# Patient Record
Sex: Female | Born: 1960 | Race: White | Hispanic: No | Marital: Married | Smoking: Never smoker
Health system: Southern US, Community
[De-identification: ages and names within clinical notes are randomized; demographics above are authoritative.]

## PROBLEM LIST (undated history)

## (undated) DIAGNOSIS — J449 Chronic obstructive pulmonary disease, unspecified: Secondary | ICD-10-CM

## (undated) DIAGNOSIS — D649 Anemia, unspecified: Secondary | ICD-10-CM

## (undated) DIAGNOSIS — J302 Other seasonal allergic rhinitis: Secondary | ICD-10-CM

## (undated) DIAGNOSIS — I10 Essential (primary) hypertension: Secondary | ICD-10-CM

## (undated) DIAGNOSIS — E785 Hyperlipidemia, unspecified: Secondary | ICD-10-CM

## (undated) DIAGNOSIS — J189 Pneumonia, unspecified organism: Secondary | ICD-10-CM

## (undated) DIAGNOSIS — I82409 Acute embolism and thrombosis of unspecified deep veins of unspecified lower extremity: Secondary | ICD-10-CM

## (undated) DIAGNOSIS — T884XXA Failed or difficult intubation, initial encounter: Secondary | ICD-10-CM

## (undated) DIAGNOSIS — J45909 Unspecified asthma, uncomplicated: Secondary | ICD-10-CM

## (undated) DIAGNOSIS — I509 Heart failure, unspecified: Secondary | ICD-10-CM

## (undated) DIAGNOSIS — E119 Type 2 diabetes mellitus without complications: Secondary | ICD-10-CM

## (undated) DIAGNOSIS — B019 Varicella without complication: Secondary | ICD-10-CM

## (undated) DIAGNOSIS — K219 Gastro-esophageal reflux disease without esophagitis: Secondary | ICD-10-CM

## (undated) DIAGNOSIS — K449 Diaphragmatic hernia without obstruction or gangrene: Secondary | ICD-10-CM

## (undated) HISTORY — PX: NASAL SINUS SURGERY: SHX719

## (undated) HISTORY — PX: CHOLECYSTECTOMY: SHX55

## (undated) HISTORY — PX: HERNIA REPAIR: SHX51

## (undated) HISTORY — PX: ESOPHAGEAL DILATION: SHX303

## (undated) HISTORY — PX: OTHER SURGICAL HISTORY: SHX169

---

## 1996-03-11 DIAGNOSIS — T8859XA Other complications of anesthesia, initial encounter: Secondary | ICD-10-CM

## 1996-03-11 HISTORY — DX: Other complications of anesthesia, initial encounter: T88.59XA

## 2003-01-10 ENCOUNTER — Other Ambulatory Visit: Payer: Self-pay

## 2003-08-13 ENCOUNTER — Other Ambulatory Visit: Payer: Self-pay

## 2003-12-15 ENCOUNTER — Ambulatory Visit: Payer: Self-pay | Admitting: Internal Medicine

## 2004-04-18 ENCOUNTER — Ambulatory Visit: Payer: Self-pay

## 2004-04-19 ENCOUNTER — Inpatient Hospital Stay: Payer: Self-pay | Admitting: Surgery

## 2004-04-28 ENCOUNTER — Emergency Department: Payer: Self-pay | Admitting: Surgery

## 2004-05-30 ENCOUNTER — Inpatient Hospital Stay: Payer: Self-pay | Admitting: Surgery

## 2004-12-25 ENCOUNTER — Ambulatory Visit: Payer: Self-pay | Admitting: Internal Medicine

## 2005-03-11 DIAGNOSIS — K449 Diaphragmatic hernia without obstruction or gangrene: Secondary | ICD-10-CM

## 2005-03-11 HISTORY — DX: Diaphragmatic hernia without obstruction or gangrene: K44.9

## 2005-06-13 ENCOUNTER — Ambulatory Visit: Payer: Self-pay | Admitting: Gastroenterology

## 2005-09-22 ENCOUNTER — Emergency Department: Payer: Self-pay | Admitting: Emergency Medicine

## 2006-04-03 ENCOUNTER — Other Ambulatory Visit: Payer: Self-pay

## 2006-04-03 ENCOUNTER — Emergency Department: Payer: Self-pay | Admitting: Emergency Medicine

## 2006-06-10 ENCOUNTER — Ambulatory Visit: Payer: Self-pay | Admitting: Internal Medicine

## 2006-10-27 ENCOUNTER — Ambulatory Visit: Payer: Self-pay | Admitting: Family Medicine

## 2007-11-09 ENCOUNTER — Ambulatory Visit: Payer: Self-pay | Admitting: Internal Medicine

## 2007-11-10 ENCOUNTER — Ambulatory Visit: Payer: Self-pay | Admitting: Internal Medicine

## 2008-01-11 ENCOUNTER — Ambulatory Visit: Payer: Self-pay | Admitting: Internal Medicine

## 2008-02-22 ENCOUNTER — Inpatient Hospital Stay: Payer: Self-pay | Admitting: Internal Medicine

## 2008-06-18 ENCOUNTER — Emergency Department: Payer: Self-pay | Admitting: Unknown Physician Specialty

## 2008-06-29 ENCOUNTER — Ambulatory Visit: Payer: Self-pay | Admitting: Internal Medicine

## 2008-08-04 ENCOUNTER — Emergency Department: Payer: Self-pay | Admitting: Emergency Medicine

## 2008-08-17 ENCOUNTER — Ambulatory Visit: Payer: Self-pay | Admitting: Internal Medicine

## 2008-10-10 ENCOUNTER — Ambulatory Visit: Payer: Self-pay | Admitting: Gastroenterology

## 2008-10-13 ENCOUNTER — Inpatient Hospital Stay: Payer: Self-pay | Admitting: *Deleted

## 2008-10-29 ENCOUNTER — Emergency Department: Payer: Self-pay | Admitting: Unknown Physician Specialty

## 2009-10-04 ENCOUNTER — Ambulatory Visit: Payer: Self-pay | Admitting: Internal Medicine

## 2009-10-11 ENCOUNTER — Ambulatory Visit: Payer: Self-pay | Admitting: Ophthalmology

## 2009-10-16 ENCOUNTER — Emergency Department: Payer: Self-pay | Admitting: Emergency Medicine

## 2009-11-13 ENCOUNTER — Emergency Department: Payer: Self-pay | Admitting: Emergency Medicine

## 2009-12-06 ENCOUNTER — Ambulatory Visit: Payer: Self-pay | Admitting: Ophthalmology

## 2010-03-28 ENCOUNTER — Inpatient Hospital Stay: Payer: Self-pay | Admitting: Internal Medicine

## 2010-06-07 ENCOUNTER — Inpatient Hospital Stay: Payer: Self-pay | Admitting: *Deleted

## 2010-12-16 ENCOUNTER — Observation Stay: Payer: Self-pay | Admitting: Internal Medicine

## 2011-02-09 ENCOUNTER — Inpatient Hospital Stay: Payer: Self-pay | Admitting: Internal Medicine

## 2011-04-08 ENCOUNTER — Inpatient Hospital Stay: Payer: Self-pay | Admitting: Internal Medicine

## 2011-04-08 LAB — COMPREHENSIVE METABOLIC PANEL
Albumin: 3.5 g/dL (ref 3.4–5.0)
Anion Gap: 9 (ref 7–16)
Bilirubin,Total: 0.5 mg/dL (ref 0.2–1.0)
Co2: 27 mmol/L (ref 21–32)
Creatinine: 0.55 mg/dL — ABNORMAL LOW (ref 0.60–1.30)
EGFR (African American): >60
EGFR (Non-African Amer.): >60
Glucose: 127 mg/dL — ABNORMAL HIGH (ref 65–99)
Potassium: 3.8 mmol/L (ref 3.5–5.1)
SGOT(AST): 26 U/L (ref 15–37)
Sodium: 143 mmol/L (ref 136–145)

## 2011-04-08 LAB — CBC
HCT: 38.5 % (ref 35.0–47.0)
HGB: 13.3 g/dL (ref 12.0–16.0)
MCH: 32.1 pg (ref 26.0–34.0)
MCV: 93 fL (ref 80–100)
RBC: 4.14 10*6/uL (ref 3.80–5.20)
RDW: 13 % (ref 11.5–14.5)
WBC: 8 10*3/uL (ref 3.6–11.0)

## 2011-04-08 LAB — TROPONIN I: Troponin-I: <0.02 ng/mL

## 2011-04-08 LAB — PRO B NATRIURETIC PEPTIDE: B-Type Natriuretic Peptide: 34 pg/mL (ref 0–125)

## 2011-04-09 LAB — CBC WITH DIFFERENTIAL/PLATELET
Basophil #: 0 10*3/uL (ref 0.0–0.1)
Basophil %: 0.2 %
Eosinophil %: 0 %
HCT: 36.5 % (ref 35.0–47.0)
HGB: 12.6 g/dL (ref 12.0–16.0)
Lymphocyte #: 0.7 10*3/uL — ABNORMAL LOW (ref 1.0–3.6)
Lymphocyte %: 7.3 %
MCV: 93 fL (ref 80–100)
Monocyte %: 1 %
Neutrophil #: 8.3 10*3/uL — ABNORMAL HIGH (ref 1.4–6.5)
Platelet: 215 10*3/uL (ref 150–440)
RBC: 3.94 10*6/uL (ref 3.80–5.20)
WBC: 9 10*3/uL (ref 3.6–11.0)

## 2011-04-09 LAB — BASIC METABOLIC PANEL
Anion Gap: 11 (ref 7–16)
BUN: 11 mg/dL (ref 7–18)
Chloride: 106 mmol/L (ref 98–107)
Co2: 26 mmol/L (ref 21–32)
Creatinine: 0.58 mg/dL — ABNORMAL LOW (ref 0.60–1.30)
Sodium: 143 mmol/L (ref 136–145)

## 2011-04-09 LAB — TROPONIN I: Troponin-I: <0.02 ng/mL

## 2011-04-26 ENCOUNTER — Ambulatory Visit: Payer: Self-pay | Admitting: Gastroenterology

## 2011-06-26 ENCOUNTER — Ambulatory Visit: Payer: Self-pay | Admitting: Internal Medicine

## 2011-07-02 ENCOUNTER — Ambulatory Visit: Payer: Self-pay | Admitting: Gastroenterology

## 2011-07-04 LAB — PATHOLOGY REPORT

## 2011-07-30 ENCOUNTER — Observation Stay: Payer: Self-pay | Admitting: Internal Medicine

## 2011-07-30 LAB — BASIC METABOLIC PANEL
Anion Gap: 9 (ref 7–16)
BUN: 6 mg/dL — ABNORMAL LOW (ref 7–18)
Calcium, Total: 8.7 mg/dL (ref 8.5–10.1)
Chloride: 106 mmol/L (ref 98–107)
Creatinine: 0.61 mg/dL (ref 0.60–1.30)
EGFR (African American): >60
EGFR (Non-African Amer.): >60
Potassium: 3.5 mmol/L (ref 3.5–5.1)
Sodium: 142 mmol/L (ref 136–145)

## 2011-07-30 LAB — CBC
HCT: 39.5 % (ref 35.0–47.0)
HGB: 13.1 g/dL (ref 12.0–16.0)
MCH: 31.2 pg (ref 26.0–34.0)
MCHC: 33.2 g/dL (ref 32.0–36.0)
MCV: 94 fL (ref 80–100)
RBC: 4.21 10*6/uL (ref 3.80–5.20)
RDW: 13.9 % (ref 11.5–14.5)
WBC: 8.6 10*3/uL (ref 3.6–11.0)

## 2011-07-31 LAB — HEMOGLOBIN A1C: Hemoglobin A1C: 6.3 % (ref 4.2–6.3)

## 2011-08-05 LAB — CULTURE, BLOOD (SINGLE)

## 2011-09-09 ENCOUNTER — Emergency Department: Payer: Self-pay | Admitting: Emergency Medicine

## 2011-09-09 LAB — URINALYSIS, COMPLETE
Bilirubin,UR: NEGATIVE
Blood: NEGATIVE
Glucose,UR: NEGATIVE mg/dL (ref 0–75)
Hyaline Cast: 1
Nitrite: NEGATIVE
Protein: NEGATIVE
RBC,UR: 1 /HPF (ref 0–5)
WBC UR: 1 /HPF (ref 0–5)

## 2011-09-09 LAB — CBC
HCT: 43.3 % (ref 35.0–47.0)
MCV: 93 fL (ref 80–100)
RBC: 4.65 10*6/uL (ref 3.80–5.20)
WBC: 13.9 10*3/uL — ABNORMAL HIGH (ref 3.6–11.0)

## 2011-09-09 LAB — HEPATIC FUNCTION PANEL A (ARMC)
Albumin: 3.7 g/dL (ref 3.4–5.0)
Alkaline Phosphatase: 122 U/L (ref 50–136)
Bilirubin,Total: 0.3 mg/dL (ref 0.2–1.0)
SGOT(AST): 17 U/L (ref 15–37)
SGPT (ALT): 28 U/L
Total Protein: 7 g/dL (ref 6.4–8.2)

## 2011-09-09 LAB — BASIC METABOLIC PANEL
Anion Gap: 8 (ref 7–16)
BUN: 16 mg/dL (ref 7–18)
Chloride: 103 mmol/L (ref 98–107)
Co2: 29 mmol/L (ref 21–32)
Creatinine: 0.74 mg/dL (ref 0.60–1.30)
EGFR (African American): >60
Osmolality: 283 (ref 275–301)
Sodium: 140 mmol/L (ref 136–145)

## 2011-09-09 LAB — CK TOTAL AND CKMB (NOT AT ARMC)
CK, Total: 41 U/L (ref 21–215)
CK-MB: 0.6 ng/mL (ref 0.5–3.6)

## 2011-09-09 LAB — PRO B NATRIURETIC PEPTIDE: B-Type Natriuretic Peptide: 37 pg/mL (ref 0–125)

## 2011-09-09 LAB — TROPONIN I: Troponin-I: <0.02 ng/mL

## 2011-09-10 ENCOUNTER — Ambulatory Visit: Payer: Self-pay | Admitting: Internal Medicine

## 2012-03-11 HISTORY — PX: EYE SURGERY: SHX253

## 2012-04-16 ENCOUNTER — Inpatient Hospital Stay: Payer: Self-pay | Admitting: Internal Medicine

## 2012-04-16 LAB — URINALYSIS, COMPLETE
Bacteria: NONE SEEN
Bilirubin,UR: NEGATIVE
Blood: NEGATIVE
Glucose,UR: >=500 mg/dL
Ketone: NEGATIVE
Leukocyte Esterase: NEGATIVE
Nitrite: NEGATIVE
Ph: 6
Protein: NEGATIVE
RBC,UR: <1 /HPF
Specific Gravity: 1.009
Squamous Epithelial: 1
WBC UR: <1 /HPF

## 2012-04-16 LAB — CBC
HCT: 40.8 % (ref 35.0–47.0)
MCH: 30.8 pg (ref 26.0–34.0)
Platelet: 265 10*3/uL (ref 150–440)
WBC: 18.2 10*3/uL — ABNORMAL HIGH (ref 3.6–11.0)

## 2012-04-16 LAB — CK TOTAL AND CKMB (NOT AT ARMC): CK-MB: 0.5 ng/mL (ref 0.5–3.6)

## 2012-04-16 LAB — COMPREHENSIVE METABOLIC PANEL WITH GFR
Albumin: 3.5 g/dL
Alkaline Phosphatase: 111 U/L
Anion Gap: 7
BUN: 13 mg/dL
Bilirubin,Total: 0.3 mg/dL
Calcium, Total: 8.4 mg/dL — ABNORMAL LOW
Chloride: 108 mmol/L — ABNORMAL HIGH
Co2: 27 mmol/L
Creatinine: 0.75 mg/dL
EGFR (African American): >60
EGFR (Non-African Amer.): >60
Glucose: 145 mg/dL — ABNORMAL HIGH
Osmolality: 286
Potassium: 3.2 mmol/L — ABNORMAL LOW
SGOT(AST): 19 U/L
SGPT (ALT): 28 U/L
Sodium: 142 mmol/L
Total Protein: 6.9 g/dL

## 2012-04-16 LAB — TROPONIN I: Troponin-I: <0.02 ng/mL

## 2012-04-17 LAB — BASIC METABOLIC PANEL
BUN: 15 mg/dL (ref 7–18)
Co2: 29 mmol/L (ref 21–32)
Creatinine: 0.61 mg/dL (ref 0.60–1.30)
Osmolality: 286 (ref 275–301)
Sodium: 140 mmol/L (ref 136–145)

## 2012-04-17 LAB — CBC WITH DIFFERENTIAL/PLATELET
Basophil #: 0 10*3/uL (ref 0.0–0.1)
Eosinophil %: 0 %
HGB: 13.1 g/dL (ref 12.0–16.0)
Lymphocyte #: 0.4 10*3/uL — ABNORMAL LOW (ref 1.0–3.6)
MCH: 31.2 pg (ref 26.0–34.0)
MCHC: 33.8 g/dL (ref 32.0–36.0)
MCV: 92 fL (ref 80–100)
Monocyte #: 0.5 x10 3/mm (ref 0.2–0.9)
Monocyte %: 3.7 %
Platelet: 241 10*3/uL (ref 150–440)
WBC: 13.6 10*3/uL — ABNORMAL HIGH (ref 3.6–11.0)

## 2012-04-17 LAB — HEMOGLOBIN A1C: Hemoglobin A1C: 7.2 % — ABNORMAL HIGH (ref 4.2–6.3)

## 2012-04-22 LAB — CULTURE, BLOOD (SINGLE)

## 2012-05-05 ENCOUNTER — Ambulatory Visit: Payer: Self-pay | Admitting: Internal Medicine

## 2012-07-09 ENCOUNTER — Emergency Department: Payer: Self-pay | Admitting: Emergency Medicine

## 2012-07-09 LAB — URINALYSIS, COMPLETE
Ketone: NEGATIVE
Nitrite: NEGATIVE
Ph: 5 (ref 4.5–8.0)
Protein: NEGATIVE
RBC,UR: <1 /HPF (ref 0–5)
Specific Gravity: 1.012 (ref 1.003–1.030)
Squamous Epithelial: 1
WBC UR: 2 /HPF (ref 0–5)

## 2012-07-09 LAB — CBC
HGB: 15.7 g/dL (ref 12.0–16.0)
MCH: 31.4 pg (ref 26.0–34.0)
Platelet: 386 10*3/uL (ref 150–440)
RBC: 5.02 10*6/uL (ref 3.80–5.20)
WBC: 11.6 10*3/uL — ABNORMAL HIGH (ref 3.6–11.0)

## 2012-07-09 LAB — CK TOTAL AND CKMB (NOT AT ARMC): CK-MB: 0.5 ng/mL (ref 0.5–3.6)

## 2012-07-09 LAB — COMPREHENSIVE METABOLIC PANEL
Albumin: 4.1 g/dL (ref 3.4–5.0)
Alkaline Phosphatase: 145 U/L — ABNORMAL HIGH (ref 50–136)
Anion Gap: 10 (ref 7–16)
BUN: 18 mg/dL (ref 7–18)
Bilirubin,Total: 0.3 mg/dL (ref 0.2–1.0)
Chloride: 98 mmol/L (ref 98–107)
Co2: 28 mmol/L (ref 21–32)
EGFR (African American): >60
Osmolality: 278 (ref 275–301)
SGPT (ALT): 27 U/L (ref 12–78)

## 2012-07-09 LAB — TROPONIN I: Troponin-I: <0.02 ng/mL

## 2012-12-04 ENCOUNTER — Ambulatory Visit: Payer: Self-pay

## 2013-02-06 ENCOUNTER — Inpatient Hospital Stay: Payer: Self-pay | Admitting: Internal Medicine

## 2013-02-06 LAB — COMPREHENSIVE METABOLIC PANEL
Albumin: 3.6 g/dL (ref 3.4–5.0)
Alkaline Phosphatase: 109 U/L
BUN: 9 mg/dL (ref 7–18)
Calcium, Total: 8.4 mg/dL — ABNORMAL LOW (ref 8.5–10.1)
Chloride: 109 mmol/L — ABNORMAL HIGH (ref 98–107)
Co2: 26 mmol/L (ref 21–32)
Creatinine: 0.73 mg/dL (ref 0.60–1.30)
EGFR (African American): >60
EGFR (Non-African Amer.): >60
Osmolality: 287 (ref 275–301)
Potassium: 3.5 mmol/L (ref 3.5–5.1)
SGOT(AST): 38 U/L — ABNORMAL HIGH (ref 15–37)
SGPT (ALT): 37 U/L (ref 12–78)

## 2013-02-06 LAB — TROPONIN I: Troponin-I: <0.02 ng/mL

## 2013-02-06 LAB — CBC
HGB: 13.5 g/dL (ref 12.0–16.0)
MCH: 32.2 pg (ref 26.0–34.0)
MCV: 92 fL (ref 80–100)
Platelet: 211 10*3/uL (ref 150–440)
RBC: 4.19 10*6/uL (ref 3.80–5.20)

## 2013-02-11 LAB — CULTURE, BLOOD (SINGLE)

## 2013-07-15 ENCOUNTER — Emergency Department: Payer: Self-pay | Admitting: Emergency Medicine

## 2013-07-23 ENCOUNTER — Emergency Department: Payer: Self-pay | Admitting: Internal Medicine

## 2013-08-16 ENCOUNTER — Ambulatory Visit: Payer: Self-pay | Admitting: Orthopedic Surgery

## 2013-09-28 DIAGNOSIS — M5416 Radiculopathy, lumbar region: Secondary | ICD-10-CM | POA: Insufficient documentation

## 2013-09-28 DIAGNOSIS — M5136 Other intervertebral disc degeneration, lumbar region: Secondary | ICD-10-CM | POA: Insufficient documentation

## 2014-04-28 DIAGNOSIS — I10 Essential (primary) hypertension: Secondary | ICD-10-CM | POA: Diagnosis not present

## 2014-04-28 DIAGNOSIS — J4550 Severe persistent asthma, uncomplicated: Secondary | ICD-10-CM | POA: Diagnosis not present

## 2014-04-28 DIAGNOSIS — J309 Allergic rhinitis, unspecified: Secondary | ICD-10-CM | POA: Diagnosis not present

## 2014-04-28 DIAGNOSIS — E119 Type 2 diabetes mellitus without complications: Secondary | ICD-10-CM | POA: Diagnosis not present

## 2014-04-28 DIAGNOSIS — E782 Mixed hyperlipidemia: Secondary | ICD-10-CM | POA: Diagnosis not present

## 2014-04-28 DIAGNOSIS — J441 Chronic obstructive pulmonary disease with (acute) exacerbation: Secondary | ICD-10-CM | POA: Diagnosis not present

## 2014-05-11 ENCOUNTER — Inpatient Hospital Stay: Payer: Self-pay | Admitting: Internal Medicine

## 2014-05-11 DIAGNOSIS — J9600 Acute respiratory failure, unspecified whether with hypoxia or hypercapnia: Secondary | ICD-10-CM | POA: Diagnosis not present

## 2014-05-11 DIAGNOSIS — Z79899 Other long term (current) drug therapy: Secondary | ICD-10-CM | POA: Diagnosis not present

## 2014-05-11 DIAGNOSIS — F419 Anxiety disorder, unspecified: Secondary | ICD-10-CM | POA: Diagnosis not present

## 2014-05-11 DIAGNOSIS — J441 Chronic obstructive pulmonary disease with (acute) exacerbation: Secondary | ICD-10-CM | POA: Diagnosis not present

## 2014-05-11 DIAGNOSIS — E119 Type 2 diabetes mellitus without complications: Secondary | ICD-10-CM | POA: Diagnosis not present

## 2014-05-11 DIAGNOSIS — R0602 Shortness of breath: Secondary | ICD-10-CM | POA: Diagnosis not present

## 2014-05-11 DIAGNOSIS — J4551 Severe persistent asthma with (acute) exacerbation: Secondary | ICD-10-CM | POA: Diagnosis not present

## 2014-05-11 DIAGNOSIS — E785 Hyperlipidemia, unspecified: Secondary | ICD-10-CM | POA: Diagnosis not present

## 2014-05-11 DIAGNOSIS — K210 Gastro-esophageal reflux disease with esophagitis: Secondary | ICD-10-CM | POA: Diagnosis not present

## 2014-05-11 DIAGNOSIS — J45901 Unspecified asthma with (acute) exacerbation: Secondary | ICD-10-CM | POA: Diagnosis not present

## 2014-05-11 DIAGNOSIS — E1165 Type 2 diabetes mellitus with hyperglycemia: Secondary | ICD-10-CM | POA: Diagnosis not present

## 2014-05-11 DIAGNOSIS — I509 Heart failure, unspecified: Secondary | ICD-10-CM | POA: Diagnosis not present

## 2014-05-11 DIAGNOSIS — I10 Essential (primary) hypertension: Secondary | ICD-10-CM | POA: Diagnosis not present

## 2014-05-11 DIAGNOSIS — J9601 Acute respiratory failure with hypoxia: Secondary | ICD-10-CM | POA: Diagnosis not present

## 2014-05-14 DIAGNOSIS — J441 Chronic obstructive pulmonary disease with (acute) exacerbation: Secondary | ICD-10-CM | POA: Diagnosis not present

## 2014-05-21 DIAGNOSIS — J441 Chronic obstructive pulmonary disease with (acute) exacerbation: Secondary | ICD-10-CM | POA: Diagnosis not present

## 2014-05-28 DIAGNOSIS — J441 Chronic obstructive pulmonary disease with (acute) exacerbation: Secondary | ICD-10-CM | POA: Diagnosis not present

## 2014-05-30 ENCOUNTER — Emergency Department: Payer: Self-pay | Admitting: Emergency Medicine

## 2014-05-30 DIAGNOSIS — J45901 Unspecified asthma with (acute) exacerbation: Secondary | ICD-10-CM | POA: Diagnosis not present

## 2014-05-30 DIAGNOSIS — R0602 Shortness of breath: Secondary | ICD-10-CM | POA: Diagnosis not present

## 2014-05-30 DIAGNOSIS — Z79899 Other long term (current) drug therapy: Secondary | ICD-10-CM | POA: Diagnosis not present

## 2014-05-30 DIAGNOSIS — J9801 Acute bronchospasm: Secondary | ICD-10-CM | POA: Diagnosis not present

## 2014-05-30 DIAGNOSIS — Z794 Long term (current) use of insulin: Secondary | ICD-10-CM | POA: Diagnosis not present

## 2014-05-30 DIAGNOSIS — Z7982 Long term (current) use of aspirin: Secondary | ICD-10-CM | POA: Diagnosis not present

## 2014-05-30 DIAGNOSIS — Z7952 Long term (current) use of systemic steroids: Secondary | ICD-10-CM | POA: Diagnosis not present

## 2014-05-30 DIAGNOSIS — Z7951 Long term (current) use of inhaled steroids: Secondary | ICD-10-CM | POA: Diagnosis not present

## 2014-05-30 DIAGNOSIS — Z880 Allergy status to penicillin: Secondary | ICD-10-CM | POA: Diagnosis not present

## 2014-06-04 DIAGNOSIS — J441 Chronic obstructive pulmonary disease with (acute) exacerbation: Secondary | ICD-10-CM | POA: Diagnosis not present

## 2014-06-07 DIAGNOSIS — J4550 Severe persistent asthma, uncomplicated: Secondary | ICD-10-CM | POA: Diagnosis not present

## 2014-06-07 DIAGNOSIS — E119 Type 2 diabetes mellitus without complications: Secondary | ICD-10-CM | POA: Diagnosis not present

## 2014-06-07 DIAGNOSIS — Z0001 Encounter for general adult medical examination with abnormal findings: Secondary | ICD-10-CM | POA: Diagnosis not present

## 2014-06-07 DIAGNOSIS — D485 Neoplasm of uncertain behavior of skin: Secondary | ICD-10-CM | POA: Diagnosis not present

## 2014-06-07 DIAGNOSIS — I10 Essential (primary) hypertension: Secondary | ICD-10-CM | POA: Diagnosis not present

## 2014-06-07 DIAGNOSIS — R300 Dysuria: Secondary | ICD-10-CM | POA: Diagnosis not present

## 2014-06-08 DIAGNOSIS — Z0001 Encounter for general adult medical examination with abnormal findings: Secondary | ICD-10-CM | POA: Diagnosis not present

## 2014-06-08 DIAGNOSIS — E119 Type 2 diabetes mellitus without complications: Secondary | ICD-10-CM | POA: Diagnosis not present

## 2014-06-08 DIAGNOSIS — Z6833 Body mass index (BMI) 33.0-33.9, adult: Secondary | ICD-10-CM | POA: Diagnosis not present

## 2014-06-21 DIAGNOSIS — E2839 Other primary ovarian failure: Secondary | ICD-10-CM | POA: Diagnosis not present

## 2014-06-21 DIAGNOSIS — M8588 Other specified disorders of bone density and structure, other site: Secondary | ICD-10-CM | POA: Diagnosis not present

## 2014-06-26 ENCOUNTER — Inpatient Hospital Stay
Admit: 2014-06-26 | Disposition: A | Discharge status: Home / Self Care | Payer: Self-pay | Attending: Internal Medicine | Admitting: Internal Medicine

## 2014-06-26 DIAGNOSIS — I10 Essential (primary) hypertension: Secondary | ICD-10-CM | POA: Diagnosis not present

## 2014-06-26 DIAGNOSIS — K219 Gastro-esophageal reflux disease without esophagitis: Secondary | ICD-10-CM | POA: Diagnosis not present

## 2014-06-26 DIAGNOSIS — E785 Hyperlipidemia, unspecified: Secondary | ICD-10-CM | POA: Diagnosis not present

## 2014-06-26 DIAGNOSIS — Z881 Allergy status to other antibiotic agents status: Secondary | ICD-10-CM | POA: Diagnosis not present

## 2014-06-26 DIAGNOSIS — R0602 Shortness of breath: Secondary | ICD-10-CM | POA: Diagnosis not present

## 2014-06-26 DIAGNOSIS — J9601 Acute respiratory failure with hypoxia: Secondary | ICD-10-CM | POA: Diagnosis not present

## 2014-06-26 DIAGNOSIS — J4521 Mild intermittent asthma with (acute) exacerbation: Secondary | ICD-10-CM | POA: Diagnosis not present

## 2014-06-26 DIAGNOSIS — Z794 Long term (current) use of insulin: Secondary | ICD-10-CM | POA: Diagnosis not present

## 2014-06-26 DIAGNOSIS — R05 Cough: Secondary | ICD-10-CM | POA: Diagnosis not present

## 2014-06-26 DIAGNOSIS — J45901 Unspecified asthma with (acute) exacerbation: Secondary | ICD-10-CM | POA: Diagnosis not present

## 2014-06-26 DIAGNOSIS — J449 Chronic obstructive pulmonary disease, unspecified: Secondary | ICD-10-CM | POA: Diagnosis not present

## 2014-06-26 DIAGNOSIS — E109 Type 1 diabetes mellitus without complications: Secondary | ICD-10-CM | POA: Diagnosis not present

## 2014-06-26 DIAGNOSIS — E119 Type 2 diabetes mellitus without complications: Secondary | ICD-10-CM | POA: Diagnosis not present

## 2014-06-26 LAB — CBC
HCT: 45.1 % (ref 35.0–47.0)
HGB: 15 g/dL (ref 12.0–16.0)
MCH: 30.7 pg (ref 26.0–34.0)
MCHC: 33.1 g/dL (ref 32.0–36.0)
MCV: 93 fL (ref 80–100)
PLATELETS: 263 10*3/uL (ref 150–440)
RBC: 4.88 10*6/uL (ref 3.80–5.20)
RDW: 13.7 % (ref 11.5–14.5)
WBC: 16.8 10*3/uL — AB (ref 3.6–11.0)

## 2014-06-26 LAB — HEMOGLOBIN A1C: Hemoglobin A1C: 6.9 % — ABNORMAL HIGH

## 2014-06-26 LAB — COMPREHENSIVE METABOLIC PANEL
ALBUMIN: 4.2 g/dL
ANION GAP: 7 (ref 7–16)
Alkaline Phosphatase: 100 U/L
BUN: 10 mg/dL
Bilirubin,Total: 0.7 mg/dL
CHLORIDE: 104 mmol/L
CREATININE: 0.49 mg/dL
Calcium, Total: 8.4 mg/dL — ABNORMAL LOW
Co2: 26 mmol/L
EGFR (Non-African Amer.): >60
Glucose: 162 mg/dL — ABNORMAL HIGH
POTASSIUM: 4 mmol/L
SGOT(AST): 27 U/L
SGPT (ALT): 21 U/L
SODIUM: 137 mmol/L
TOTAL PROTEIN: 7.1 g/dL

## 2014-06-26 LAB — TROPONIN I

## 2014-06-27 LAB — BASIC METABOLIC PANEL
ANION GAP: 12 (ref 7–16)
BUN: 17 mg/dL
Calcium, Total: 9.8 mg/dL
Chloride: 103 mmol/L
Co2: 28 mmol/L
Creatinine: 0.71 mg/dL
EGFR (Non-African Amer.): >60
Glucose: 220 mg/dL — ABNORMAL HIGH
Potassium: 3.5 mmol/L
Sodium: 143 mmol/L

## 2014-06-27 LAB — CBC WITH DIFFERENTIAL/PLATELET
BASOS ABS: 0 10*3/uL (ref 0.0–0.1)
Basophil %: 0.1 %
EOS ABS: 0 10*3/uL (ref 0.0–0.7)
Eosinophil %: 0.1 %
HCT: 42.1 % (ref 35.0–47.0)
HGB: 13.9 g/dL (ref 12.0–16.0)
LYMPHS ABS: 0.9 10*3/uL — AB (ref 1.0–3.6)
Lymphocyte %: 7.8 %
MCH: 30.5 pg (ref 26.0–34.0)
MCHC: 33.1 g/dL (ref 32.0–36.0)
MCV: 92 fL (ref 80–100)
MONO ABS: 0.6 x10 3/mm (ref 0.2–0.9)
Monocyte %: 5.2 %
NEUTROS PCT: 86.8 %
Neutrophil #: 10 10*3/uL — ABNORMAL HIGH (ref 1.4–6.5)
PLATELETS: 267 10*3/uL (ref 150–440)
RBC: 4.57 10*6/uL (ref 3.80–5.20)
RDW: 13.5 % (ref 11.5–14.5)
WBC: 11.5 10*3/uL — ABNORMAL HIGH (ref 3.6–11.0)

## 2014-07-01 NOTE — Discharge Summary (Signed)
PATIENT NAME:  Cassie Ross, Cassie Ross MR#:  [id number] DATE OF BIRTH:  28-Oct-1960  DATE OF ADMISSION:  04/16/2012 DATE OF DISCHARGE:  04/19/2012  PRIMARY CARE PHYSICIAN:  Dr. Clayborn Bigness.   DISCHARGE DIAGNOSES:  Pneumonia, asthma exacerbation, chronic congestive heart failure.   HISTORY OF PRESENT ILLNESS:  The patient is a 54 year old Caucasian female with history of hypertension, diabetes, presented with severe asthma, gastroesophageal reflux disease, gastritis and CHF, presented to the Emergency Room with shortness of breath, cough and sputum for two weeks.  She took doxycycline and prednisone for one week, but without any relief so she came to Emergency Room for further evaluation.  She denied any fever or chills.  No further headache, dizziness or chest pain.  Chest x-ray showed pneumonia and so she was admitted for pneumonia and asthma exacerbation.   HOSPITAL COURSE AND STAY:  Pneumonia with COPD exacerbation.  She was maintained with oxygen nasal cannula, IV Solu-Medrol, nebulized bronchodilator, Levaquin IV.  She felt better with the treatment after 3 to 4 days and then she was advised to continue the same at home after discharge.   OTHER MEDICAL ISSUES ADDRESSED DURING THE HOSPITAL STAY:  1.  Hypokalemia.  It improved with replacement.  2.  Hypertension.  Controlled with medication.  Continue Lasix, Cardizem.  3.  Diabetes.  We continued sliding scale.  4.  CHF.  We continued aspirin, Lasix and Cardizem.  She was not in exacerbation of CHF while in the hospital.  5.  Gastroesophageal reflux disease.  We continued oral PPI.    LABORATORY RESULTS DURING THE HOSPITAL STAY:  WBC on admission 18.2, hemoglobin 13.5, platelet count 265, BUN 13, glucose 145, creatinine 0.75, sodium 142, potassium 3.2, chloride 108, CO2 27, troponin less than 0.02.  BNP 176.  Chest x-ray, portable, subtle opacity at the left lower lung, may be related to atelectasis.  Infection not excluded.  Blood culture, no growth  after 48 hours.  Urinalysis negative.   CONDITION ON DISCHARGE:  Stable.   CODE STATUS:  FULL CODE.  MEDICATIONS ADVISED ON DISCHARGE:  Albuterol and ipratropium nebulizers every 4 to 6 hours as needed for shortness of breath, alprazolam 0.5 mg oral tablet once a day in the morning and 1 tablet once a day at bedtime, aspirin enteric-coated 81 mg tablet once a day, potassium chloride 10 mEq oral tablet 2 times a day, Once-A-Day Woman 50 Plus oral tablet, theophylline 300 mg 24-hour capsule extended-release once a day, diltiazem 180 mg 24-hour oral capsule extended release 2 times a day, furosemide 40 mg tablet 2 times a day, Lipitor 10 mg oral tablet once a day, Protonix 40 mg oral (once a day, Singular 10 mg oral tablet once a day, loratadine 10 mg oral tablet once a day, Symbicort 2 puffs inhaled 2 times a day, calcium plus vitamin D 1 tablet 2 times a day, prednisone 10 mg oral tablet start at 60 mg and taper by 10 mg daily until complete.  Azithromycin 500 mg oral tablet once a day for 3 days.   HOME OXYGEN ON DISCHARGE:  No.   DIET ON DISCHARGE:  Carbohydrate-controlled ADA diet.   DIET CONSISTENCY:  Regular.   ACTIVITY LIMITATION:  As tolerated.  No exertional activity for 3 to 4 days.   TIMEFRAME TO FOLLOW-UP:  1 to 2 weeks with primary care physician Dr. Clayborn Bigness, [facility], [place], [place].  TOTAL TIME SPENT IN DISCHARGE:  35 minutes.     ____________________________ Ceasar Lund  Anselm Jungling, MD vgv:ea D: 04/23/2012 22:33:23 ET T: 04/23/2012 23:19:34 ET JOB#: [id number]  cc: Ceasar Lund. Anselm Jungling, MD, <Dictator> Lavera Guise, MD Rosalio Macadamia [facility] MD ELECTRONICALLY SIGNED 05/01/2012 18:05

## 2014-07-01 NOTE — Discharge Summary (Signed)
PATIENT NAME:  Cassie Ross, Cassie Ross MR#:  [id number] DATE OF BIRTH:  01/04/1961  DATE OF ADMISSION:  02/06/2013 DATE OF DISCHARGE:  02/10/2013  DISCHARGE DIAGNOSIS:  Asthma exacerbation, now improving.   SECONDARY DIAGNOSES: 1. Hypertension.  2.  Diabetes.  3.  Severe asthma.    4.  Gastroesophageal reflux disease.  5.  Gastritis.  6.  Hyperlipidemia.  7.  Anxiety.    CONSULTATIONS: None.   PROCEDURES AND RADIOLOGY: Chest x-ray on the November 29, showed no acute cardiopulmonary disease.   MAJOR LABORATORY PANEL: Blood cultures x 2 were negative on November 29.   Sputum culture was within normal limits.   HISTORY AND SHORT HOSPITAL COURSE: The patient is a 54 year old female with the above-mentioned medical problems admitted for asthma exacerbation. Please see Dr. Helene Kelp Tullo's dictated history and physical for further details. The patient was started on IV steroids, nebulizer breathing treatment and antibiotics empirically and she was slowly responding to the same. By December 3, she was close to her breathing status and was discharged home in stable condition. She still had some minimal wheezing, although she was feeling significantly better than when she came in and was comfortable going home on the date of discharge, her vital signs are as follows: Temperature 97.6, heart rate 88 per minute, respirations 19, blood pressure 140/92, she was saturating in 2% on room air.   PERTINENT PHYSICAL EXAMINATION ON THE DATE OF DISCHARGE: CARDIOVASCULAR: S1, S2 normal. No rubs gallop.   LUNGS: Clear to auscultation bilaterally. No wheezing, rales, rhonchi or crepitation.  ABDOMEN: Soft, benign.  NEUROLOGIC: Nonfocal examination.  All other physical examination remained at baseline.   DISCHARGE MEDICATIONS: 1.  Alprazolam 0.5 mg 1/2 tablet p.o. daily and 1 tablet p.o. at bedtime.  2.  Aspirin 81 mg p.o. daily.  3.  Klor-Con 10 mEq p.o. b.i.d.  4.  One-A-Day women's vitamin once daily.  5.   Theophylline 300 mg p.o. daily.  6.  Cardizem 180 mg p.o. b.i.d.  7.  Protonix 40 mg p.o. daily.  8.  Singulair once daily.  9.  Loratadine 10 mg p.o. daily.  10.  Calcium with vitamin D 1 tablet p.o. b.i.d.   11.  Albuterol nebulizer every six hours as needed.  12.  Lasix 80 mg p.o. b.i.d.  13.  Lipitor 10 mg p.o. at bedtime.  14.  Symbicort 1 puff inhaled twice a day.  15.  Sucralfate 1 gram p.o. 4 times a day.  16.  Ventolin  2 puffs inhaled 4 times a day as needed.  17.  Prednisone 60 mg p.o. daily, taper 10 mg daily until finished.   DISCHARGE DIET: Low sodium.   DISCHARGE ACTIVITY: As tolerated.   DISCHARGE INSTRUCTIONS AND FOLLOW-UP: The patient was instructed to follow up with her primary care physician, Dr. Clayborn Bigness, in 1 to 2 weeks. She was also instructed to follow up with lung doctor,  Dr. Devona Konig in 2 to 4 weeks.   TOTAL TIME DISCHARGING THIS PATIENT: 45 minutes.    ____________________________ Lucina Mellow. Manuella Ghazi, MD vss:cc D: 02/11/2013 16:59:21 ET T: 02/12/2013 00:33:21 ET JOB#: [id number]  cc: Tameaka Eichhorn S. Manuella Ghazi, MD, <Dictator> Lavera Guise, MD Allyne Gee, MD [place] MD ELECTRONICALLY SIGNED 02/12/2013 18:25

## 2014-07-01 NOTE — H&P (Signed)
PATIENT NAME:  Cassie Ross, PELLE MR#:  [id number] DATE OF BIRTH:  01-27-1961  DATE OF ADMISSION:  04/16/2012  PRIMARY CARE PHYSICIAN: Lavera Guise, MD  REFERRING PHYSICIAN: Algis Liming. Jimmye Norman, MD  CHIEF COMPLAINT: Shortness of breath, cough, sputum for 2 weeks.   HISTORY OF PRESENT ILLNESS: A 54 year old Caucasian female with a history of hypertension, diabetes, persistent severe asthma, GERD, gastritis, CHF, who presented to the ED with the above chief complaint. The patient is alert, awake, oriented, in no acute distress. The patient said she has had cough, sputum and shortness of breath for the past 2 weeks. She took doxycycline and prednisone for 1 week, but without relief, so she came to ED for further evaluation. The patient denies any fever, chills. No headache or dizziness. No chest pain, palpitation, orthopnea or nocturnal dyspnea. No leg edema. The patient denies hemoptysis. She has a lot of wheezing. Chest x-ray showed pneumonia. The patient was treated with antibiotics and admitted for pneumonia.   PAST MEDICAL HISTORY: As mentioned above:  1. Hypertension.  2. Diabetes.  3. CHF. 4. Persistent severe asthma. 5. GERD. 6. Gastritis.  7. Hyperlipidemia.  8. Anxiety.  9. The patient also has a history of respiratory failure in the past due to asthma exacerbation, requiring intubation.   SOCIAL HISTORY: No smoking, alcohol drinking or illicit drugs.   FAMILY HISTORY: A brother has COPD. Father had colon cancer. Mother had lung cancer. Family history significant for asthma and hypertension.   ALLERGIES: LEVAQUIN, PENICILLIN, SUNFLOWER SEEDS, SHELLFISH.   HOME MEDICATIONS:  1. Ventolin 90 mcg inhalation every 4 to 6 hours. 2. Theophylline 300 mg cap 1 cap a day. 3. Symbicort 160 mcg/4.5 mcg 2 puffs b.i.d.  4. Singulair 10 mg p.o. at bedtime. 5. Protonix 40 mg p.o. daily. 6. One-A-Day Woman 50+ tablet once a day.  7. Loratadine 10 mg p.o. daily. 8. Lipitor 10 mg p.o.  daily. 9. Klor-Con M10 p.o. tablets 1 tablet twice a day. 10. Lasix 40 mg 2 tablets b.i.d.  11. Diltiazem 180 mg 1 cap b.i.d. 12. Calcium 600 + D 600 mg 1 tablet b.i.d. 13. Aspirin 81 mg p.o. daily.  14. Alprazolam 0.5 mg p.o. tablet 0.5 tab p.o. once a day.  15. DuoNeb 2.5 mcg/0.5 mcg per 3 mL inhalation 4 times a day p.r.n.   REVIEW OF SYSTEMS:  CONSTITUTIONAL: The patient denies any fever, chills. No headache or dizziness, but has generalized weakness.  EYES: No double vision or blurred vision.  ENT: No postnasal drip, slurred speech or dysphagia.  CARDIOVASCULAR: No chest pain, palpitation, orthopnea or nocturnal dyspnea. No leg edema.  PULMONARY: Positive for cough, sputum, shortness of breath, wheezing, but no hemoptysis.  GASTROINTESTINAL: No abdominal pain, nausea, vomiting or diarrhea. No melena or bloody stool.  GENITOURINARY: No dysuria, hematuria or incontinence.  SKIN: No rash or jaundice.  NEUROLOGY: No syncope, loss of consciousness or seizure.  HEMATOLOGY: No easy bruising or bleeding.  ENDOCRINOLOGY: No polyuria or polydipsia.   PHYSICAL EXAMINATION:  VITAL SIGNS: Temperature 98.4, blood pressure 140/68, pulse 97, respirations 18, O2 saturation 95% on room air.  GENERAL: The patient is alert, awake, oriented, in no acute distress.  HEENT: Pupils round and equally reactive to light and accommodation. Moist oral mucosa. Clear oropharynx.  NECK: Supple. No JVD or carotid bruits. No lymphadenopathy. No thyromegaly.  CARDIOVASCULAR: S1, S2, regular rate and rhythm. No murmurs or gallops.  PULMONARY: Bilateral severe wheezing and crackles. No use of accessory muscles to breathe.  ABDOMEN: Soft. Bowel sounds present. No distention or tenderness. No organomegaly.  EXTREMITIES: No edema, clubbing or cyanosis. No calf tenderness. Strong bilateral pedal pulses.  SKIN: No rash or jaundice.  NEUROLOGY: A and O x3. No focal deficit. Power 5 out of 5. Sensation intact.   CHEST  X-RAY: Showed subtle opacity at the left lower lung, infection not excluded.   LABORATORY DATA: Ph 7.46, pCO2 40, pO2 74. WBC 18.2, hemoglobin 13.5, platelets 265. Glucose 145, BUN 13, creatinine 0.75, sodium 142, potassium 3.2, chloride 108, bicarbonate 27. Troponin less than 0.02. BNP 176. EKG showed normal sinus rhythm at 86 BPM.   IMPRESSION:  1. Pneumonia.  2. Leukocytosis.  3. Chronic obstructive pulmonary disease exacerbation.  4. Hypokalemia.  5. Hypertension.  6. Diabetes.  7. History of congestive heart failure. 8. Gastroesophageal reflux disease.  PLAN OF TREATMENT:  1. The patient will be admitted to medical floor. Will continue O2 by nasal cannula. Start Solu-Medrol, DuoNeb, Levaquin IV.  2. Will follow up blood culture, sputum culture and CBC.  3. For hypertension and CHF, will continue aspirin, Lasix, Cardizem.   4. For diabetes, will start sliding scale and check hemoglobin A1c.  5. GI and DVT prophylaxis. 6. Discussed the patient's situation and plan of treatment with the patient and the patient's husband. The patient is full code.   TIME SPENT: About 55 minutes.   ____________________________ Demetrios Loll, MD qc:OSi D: 04/16/2012 08:36:17 ET T: 04/16/2012 09:02:39 ET JOB#: [id number]  cc: Demetrios Loll, MD, <Dictator> Demetrios Loll MD ELECTRONICALLY SIGNED 04/16/2012 18:42

## 2014-07-02 NOTE — H&P (Signed)
PATIENT NAME:  Cassie Ross, Cassie Ross MR#:  [id number] DATE OF BIRTH:  1960-11-19  DATE OF ADMISSION:  02/06/2013  CHIEF COMPLAINT: Dyspnea and wheezing.   HISTORY OF PRESENT ILLNESS: [name] is a 54 year old white female with a history of severe asthma with multiple hospitalizations for respiratory failure, two requiring intubations, the last one in 1991, who presents with a one-week history of progressive dyspnea with exertion accompanied by a cough productive of thick sputum. Symptoms started after contact with sick son. She started taking a home prescribed prednisone dose of 50 mg daily accompanied by doxycycline 100 mg twice daily two days prior to admission for progression of symptoms despite use of albuterol, Atrovent nebulizer at home several times daily. She presented to the Emergency Room today when she could not resolve her coughing and dyspnea and was given 4 nebulizer treatments in the Emergency Room with no significant improvement. She being admitted for further management.   PRIMARY CARE PHYSICIAN:  Dr. Humphrey Rolls.   PULMONOLOGIST: Dr. Devona Konig.   PAST MEDICAL HISTORY:  Hypertension, diabetes, severe asthma, gastroesophageal reflux disease, gastritis by prior endoscopy, hyperlipidemia, anxiety.   PAST SURGICAL HISTORY: Hiatal hernia surgery, sinus surgery and cholecystectomy.   SOCIAL HISTORY: She is married. Does not smoke or drink or use illicit drugs.   FAMILY HISTORY: Positive for a brother with COPD, father with colon cancer and mother with lung cancer.   ALLERGIES:  PENICILLIN. She has listed an allergy to Levaquin but has tolerated this medication without rash or adverse events per last admission.   REVIEW OF SYSTEMS: Notable for some weight gain over the last 2 days due to a sedentary behavior for COPD exacerbation. No recent visual changes or history of glaucoma. No history of  hearing loss. She does have seasonal rhinitis. She denies any difficulty swallowing. RESPIRATORY:  Positive for cough, wheezing, and dyspnea. She denies chest pain. She has had some orthopnea due to shortness of breath or cough. She has had some lower extremity edema due to sedentary nature. She denies any palpitations or syncope. GASTROINTESTINAL: Negative for nausea, vomiting, diarrhea, or abdominal pain. She has no history of dysuria or hematuria. She does have a history of diabetes with some nocturia  noted. No heat or cold intolerance or thirst. She has no history of anemia, easy bruising or bleeding. She denies any recent skin changes. She has no history of chronic pain. She has no numbness, weakness, dysarthria, or epilepsy. No history of strokes, migraines, or seizures. She does have a history of anxiety and insomnia managed with alprazolam.   PHYSICAL EXAMINATION: GENERAL: This is an obese middle-aged female in mild respiratory distress secondary to cough and wheezing.  VITAL SIGNS:  On admission: Blood pressure 137/74, pulse initially was 116, repeat 101, rhythm is regular, temperature is 98.2, respiratory rate was 24 and now 20. Weight was not done  HEENT: Pupils are equal, round, and reactive to light. Extraocular movements are intact.  NECK: Supple without masses, lymphadenopathy, or carotid bruits. She has no JVD.  LUNGS: Notable for poor air movement in all lung fields with wheezing in all lung fields. She is not using accessory muscles.  CARDIOVASCULAR: Tachycardic but regular. No murmurs, rubs, or gallops. Pedal pulses are palpable and there is no lower extremity edema.  ABDOMEN: Obese, nontender, and nondistended with good bowel sounds and no evidence of hepatosplenomegaly.  MUSCULOSKELETAL: Grossly nonfocal.  SKIN: Skin is warm and dry without rashes or lesions. There is no cervical, axillary, inguinal, or supraclavicular  lymphadenopathy.  NEUROLOGICAL: Grossly nonfocal.  PSYCHIATRIC: She is alert and oriented to person, place, and time.   ADMISSION LABORATORY DATA: Sodium 142,  potassium 3.5, chloride 109, bicarbonate 26, BUN 9, creatinine 0.3, glucose 189. Liver tests are normal except for AST of 38. White count is 10.4, hemoglobin 13.5, hematocrit 38.6, platelets 211. Troponin I is less than 0.02. PA and lateral chest x-ray shows lung vitamins and probable focal atelectasis in the lingular area.   ASSESSMENT AND PLAN: 1.  Acute respiratory failure secondary to asthma exacerbation. The patient has failed outpatient therapy using prednisone, albuterol nebulizers and antibiotics. We will admit to Med/Surg unit. IV steroids have been started in the Emergency Room. Continue Xopenex nebulizers. Sputum cultures have been ordered. The patient will receive Levaquin and azithromycin. She has a PENICILLIN ALLERGY. She has been ordered a magnesium infusion to help relax her airways and will receive a continuous Xopenex nebulizer for 30 minutes to see if this will help. Continues Singulair and Symbicort and Theo-Dur per home regimen. May need to have Dr. Devona Konig, her pulmonologist see her if she fails to improve in the next 24 hours.  2.  Diabetes mellitus, likely secondary to recurrent steroid use and obesity. We will follow her blood sugars twice daily and begin insulin for sugars greater than 300.  3.  Hypertension. Continue furosemide but will reduce the dose to 40 mg twice daily for now. 4.  Gastroesophageal reflux disease. Continue Protonix.   ESTIMATED TIME OF CARE: 60 minutes    ____________________________ Deborra Medina, MD tt:dp D: 02/06/2013 16:55:56 ET T: 02/06/2013 17:18:38 ET JOB#: [id number]  cc: Deborra Medina, MD, <Dictator> Deborra Medina MD ELECTRONICALLY SIGNED 04/09/2013 17:56

## 2014-07-03 NOTE — H&P (Signed)
PATIENT NAME:  Cassie Ross, Cassie Ross MR#:  [id number] DATE OF BIRTH:  09/07/1960  DATE OF ADMISSION:  07/30/2011  REFERRING PHYSICIAN:   Francene Castle, MD   PRIMARY CARE PHYSICIAN: Allyne Gee, MD    CHIEF COMPLAINT: Shortness of breath, cough, wheezing for two days.   HISTORY OF PRESENT ILLNESS: The patient is a 54 year old Caucasian female with a history of severe persistent asthma, intubation twice in the past, hypertension, diabetes, gastroesophageal reflux disease, gastritis, hyperlipidemia, anxiety, presented in the ED with shortness of breath, cough, and wheezing for the past two days and worsening today. The patient took a nebulizer at home, but symptoms were not relieved. She came to the ED for further treatment. She was treated with steroids, nebulizer, but still has a cough, wheezing, and shortness of breath. Oxygen saturation is 92% oxygen. She denies any fever or chills. No headache or dizziness. No recent ill contact. She said she has chronic leg edema which has been treated with high-dose Lasix 80 mg p.o. b.i.d. She did not take Lasix today.   PAST MEDICAL HISTORY:  1. Hypertension.  2. Diabetes.  3. Persistent, severe asthma.  4. Gastroesophageal reflux disease.  5. Gastritis which was diagnosed recently by endoscopy.  6. Hyperlipidemia. 7. Anxiety. 8. Respiratory failure in the past due to asthma exacerbation requiring intubation.   PAST SURGICAL HISTORY:  1. Hiatal hernia surgery. 2. Sinus surgery. 3. Cholecystectomy.    SOCIAL HISTORY: No smoking, alcohol drinking, or illicit drugs.   FAMILY HISTORY: Brother has chronic obstructive pulmonary disease. Father had colon cancer.  Mother had lung cancer. Family history is significant for asthma and hypertension.   ALLERGIES: Levaquin, penicillin, sunflower seeds, shellfish.   HOME MEDICATIONS:  1. Albuterol/ipratropium 2.5 mg/0.5 mg inhalation 3 mL q.i.d. p.r.n.  2. Alprazolam 0.5 mg p.o. tablets p.r.n.  3. Aspirin 81  mg p.o. once daily.  4. Atorvastatin 10 mg p.o. at bedtime. 5. Budesonide/formoterol160 mcg/4.5 mcg inhalation 1 puff b.i.d.  6. Cardizem CD 180 mg p.o. b.i.d.  7. Flonase 50 mcg, 1 spray nasal twice daily.  8. Lasix 80 mg p.o. b.i.d.  9. K-Dur extended release 10 mEq, 2 oral tablets b.i.d.  10. Nitro-Dur 0.4 mg sublingual every 5 minutes p.r.n.  11. Prednisone taper. 12. Theo-24 300 mg extended-release p.o. once daily.  13. Ventolin HFA 90 mcg inhalation, 1 inhaled every 4 to 6 hours  p.r.n.    REVIEW OF SYSTEMS: CONSTITUTIONAL: The patient denies any fever, chills. No headache or dizziness. No weight loss. HEENT: No double vision, blurred vision. No postnasal drip or epistaxis. No dysphagia. CARDIOVASCULAR: No chest pain, palpitation, orthopnea, nocturnal dyspnea but has leg edema. PULMONARY: Positive for cough, shortness of breath, sputum, wheezing, but no hemoptysis. GI: No abdominal pain, nausea, vomiting, or diarrhea. GU: No dysuria, hematuria or incontinence. SKIN: No rash or jaundice. NEUROLOGY: No syncope, loss of consciousness or seizure. ENDOCRINE: No polyuria or polydipsia. HEMATOLOGY: No easy bruising or bleeding.   PHYSICAL EXAMINATION:  VITALS: Temperature 97, blood pressure 129/86, pulse 114, respirations 28, oxygen saturation 97% on room air.   GENERAL: The patient is alert, awake, oriented, in no acute distress.   HEENT: Pupils are round, equal, and reactive to light and accommodation. Moist oral mucosa. Clear oropharynx.   NECK: Supple. No JVD or carotid bruit. No lymphadenopathy. No thyromegaly.  CARDIOVASCULAR: S1, S2, regular rate and rhythm, tachycardia. No murmurs or gallops.   PULMONARY: Bilateral air entry, expiratory wheezing. No rales, no crackles.   ABDOMEN:  Soft, obese. Bowel sounds are present. No tenderness. No organomegaly. No distention.   EXTREMITIES: Bilateral leg edema. No calf tenderness or clubbing or cyanosis. Strong bilateral pedal pulses.    SKIN: No rash or jaundice.   NEUROLOGICAL: Alert and oriented x3. No focal deficit. Power five out of five. Sensation intact. Deep tendon reflexes 2+.   LABORATORY, DIAGNOSTIC AND RADIOLOGICAL DATA:  WBC 8.6, hemoglobin 13.1, platelets 265. Glucose 109, BUN 6, creatinine 0.61, potassium 3.5, sodium 142, bicarbonate 27, chloride 106.   IMPRESSION:  1. Asthma exacerbation.  2. Hypertension.  3. Diabetes.  4. Gastroesophageal reflux disease. 5. Hyperlipidemia.  6. Gastritis.   PLAN OF TREATMENT:  1. The patient will be placed for observation.  2. We will continue some Solu-Medrol, DuoNebs p.r.n. and Zithromax 500 mg IVPB. In addition, we will give Spiriva, Flonase, and Combivent.  3. For diabetes, we will start sliding scale. Check hemoglobin A1c. Monitor blood sugar closely.  4. For hypertension, we will continue Cardizem and Lasix and continue potassium.  5. GI and deep vein thrombosis prophylaxis.   I discussed the patient's situation and the plan of treatment.    TIME SPENT: About 55 minutes.   ____________________________ Demetrios Loll, MD qc:cbb D: 07/30/2011 16:04:42 ET T: 07/30/2011 16:29:47 ET JOB#: [id number]  cc: Demetrios Loll, MD, <Dictator> Allyne Gee, MD Demetrios Loll MD ELECTRONICALLY SIGNED 07/30/2011 17:16

## 2014-07-03 NOTE — H&P (Signed)
PATIENT NAME:  Cassie Ross, Cassie Ross MR#:  [id number] DATE OF BIRTH:  08-Dec-1960  DATE OF ADMISSION:  04/08/2011  PRIMARY CARE PHYSICIAN: Devona Konig, MD  PRIMARY CARE PHYSICIAN: The patient is a 54 year old Caucasian female with past medical history significant for history of severe persistent asthma, history of prior intubations in the past, at least twice, who presented to the hospital with complaints of wheezing as well as shortness of breath. According to the patient, she was noted to have acute bronchitis approximately two months ago. She was started on prednisone as well as antibiotic therapy; however, as soon as her prednisone taper was complete she would have symptoms again. Now she is coming to the hospital with significant cough as well as severe shortness of breath. She was not even able to speak earlier today when she came into the Emergency Room. She was coughing and today she started noticing green phlegm, which is thick and sticky.  In the past, she would get up some whitish phlegm however not much green or colored phlegm. She admits of shortness of breath as well as chest tightness, as mentioned above, earlier today; however, she denies any fevers or chills. She was seen at a walk-in clinic two to three weeks ago where again she was given antibiotic therapy as well as prednisone, and a week ago. However, her symptoms did not abate. Here in the Emergency Room, she was given Decadron as well as two DuoNebs. She seems to be a little bit more comfortable; however, she become really short of breath as soon as she starts talking.  PAST MEDICAL HISTORY:  1. History of hypertension. 2. Severe persistent asthma.  3. History of chronic lower extremity edema for which she is taking Lasix.  4. History of gastroesophageal reflux disease. 5. History of steroid-induced hyperglycemia. 6. History of hiatal hernia requiring an operation.  7. History of hyperlipidemia. 8. History of anxiety. 9. Acute  respiratory failure in the past due to asthma requiring intubation, as mentioned above.   PAST SURGICAL HISTORY:  1. Hiatal hernia surgery. 2. Sinus surgery. 3. Cholecystectomy.   SOCIAL HISTORY: She lives with her husband. No smoking, alcohol or drug abuse. 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  FAMILY HISTORY: The patient's brother had chronic obstructive pulmonary disease. The patient's father had colon cancer. Mother had lung cancer. Family history is also significant for asthma as well as hypertension.   MEDICATIONS:  1. Theophylline ER 300 mg p.o. daily.  2. Cardizem CD 180 mg twice daily. 3. Omeprazole 40 mg p.o. daily. 4. Symbicort 160/4.5 twice daily. 5. Ventolin and Atrovent nebulizers. 6. Lasix 80 mg p.o. twice daily.  7. Calcium with vitamin D. 8. Aspirin 81 mg p.o. daily. 9. Nitroglycerin 0.4 mg sublingually as needed.  10. Multivitamins daily. 11. Lipitor 10 mg at bedtime.   REVIEW OF SYSTEMS: Positive for pains in the right lung, especially with cough, glaucoma as well as cataract surgery in the past, sinus congestion as well as sneezing in the mornings, cough and wheezing, severe shortness of breath, painful respirations and right chest area pain with cough, also intermittent arrhythmias especially whenever she uses nebulizers. CONSTITUTIONAL: She denies any fevers, chills, fatigue, weakness, weight loss or gain. EYES: Denies blurry vision or double vision. ENT: Denies any tinnitus, allergies, epistaxis, sinus pain, dentures, or difficulty swallowing. RESPIRATORY: Denies any hemoptysis. CARDIOVASCULAR: Denies orthopnea, edema, palpitations, or syncope. GASTROINTESTINAL: Denies nausea, vomiting, diarrhea, or constipation. GENITOURINARY: Denies dysuria, hematuria, frequency, or incontinence. ENDOCRINOLOGY: Denies any polydipsia,  nocturia, thyroid problems, heat or cold intolerance, or thirst. HEMATOLOGIC: Denies anemia, easy bruising, or swollen glands. SKIN: Denies any acne,  rashes, lesions, or change in moles. MUSCULOSKELETAL: Denies arthritis, cramps, swelling, or gout. NEUROLOGIC: Denies numbness, epilepsy, or tremor. PSYCHIATRIC: Admits of history of anxiety. No depression or insomnia. Denies bipolar disorder or nervousness.  PHYSICAL EXAMINATION:   VITALS: On arrival to the hospital, her temperature was 98.7, pulse 122, respirations 24, blood pressure 150/78, and saturation 94% on room air.   GENERAL: This is a well developed, well nourished mildly obese Caucasian female in moderate distress secondary to her wheezing and shortness of breath with wheezing for a distance.  HEENT: Pupils are equally round and reactive to light. Extraocular movements are intact. No icterus or conjunctivitis. She has normal hearing. No pharyngeal erythema. Mucosa is moist.   NECK: No masses, supple and nontender.   LUNGS: Thyroid is not enlarged. No adenopathy. No JVD or carotid bruits bilaterally. Full range of motion.   LUNGS: Diminished breath sounds bilaterally as well as bilateral wheezing, labored inspirations, also increased respiratory distress whenever she tries to talk and increased effort to breathe. She is in moderate respiratory distress.   CARDIOVASCULAR: S1 and S2 appreciated. No murmurs, gallops, or rubs noted. Rhythm is regular. PMI is not lateralized. Chest is nontender to palpation.   EXTREMITIES: 1+ pedal pulses. Trace lower extremity edema. No clubbing or cyanosis noted.   ABDOMEN: Soft, nontender. Bowel sounds are present. No hepatosplenomegaly or masses were noted.  RECTAL: Deferred.  MUSCULOSKELETAL: Muscle strength able to move all extremities. No degenerative joint disease or kyphosis. Gait was not tested.   SKIN: No rashes, lesions, erythema, nodularity, or induration. It was warm and dry to palpation.   LYMPH: No adenopathy in the cervical region.   NEUROLOGIC: Cranial nerves grossly intact. Sensory is intact. No dysarthria or aphasia. The  patient is alert and oriented to time, person, and place. Cooperative. Memory is good.   PSYCHIATRIC: No confusion, agitation, or depression.   LABS/STUDIES: EKG: Sinus tachycardia at 104 beats per minute, normal axis, inferior infarct, age undetermined, according to EKG criteria. It seems to be stable since prior EKG done in December 2012.  Chest x-ray was unremarkable, according to the radiologist. No acute cardiopulmonary disease was noted on portable single chest x-ray today, 03/24/2011.   Glucose is elevated to 127, otherwise unremarkable BMP. The patient's liver enzymes were normal. Cardiac enzymes, first set was negative. CBC is within normal limits. No differential was done yet.   ABGs were done on room air and showed pH of 7.40, pCO2 41, pO2 64, and saturation 94%.   ASSESSMENT AND PLAN:  1. Asthma exacerbation: Admit the patient to the medical floor. The cause of the patient's last asthma exacerbation remains unclear. Previously the patient was having problems with questionable reflux disease, as well as sinus drainage. We will get CT scan of the chest and also a barium swallowing study to rule out significant reflux. We will continue the patient on steroids and inhalation therapy as well as antibiotics and check theophylline level.  2. Acute bronchitis, questionable aspiration: As above, we will get CT scan of the chest as well as barium swallowing study. We will continue the patient on antibiotic therapy which will be initiated here in the emergency room. Get sputum cultures and adjust antibiotics according to culture results.  3. Hyperglycemia: Start the patient on sliding scale insulin due to steroid-induced hyperglycemia.  4. History of hypertension: Continue outpatient medications.  5. History of gastroesophageal reflux disease: Continue omeprazole and get barium swallowing study.       6. History of lower extremity swelling: This seems to be stable, according to the patient.  We will continue Lasix.  7. History of hyperlipidemia: Continue Lipitor.   TIME SPENT: One hour.   ____________________________ Theodoro Grist, MD rv:[id number] D: 04/08/2011 11:53:42 ET T: 04/08/2011 13:30:55 ET JOB#: [id number]  cc: Theodoro Grist, MD, <Dictator> Allyne Gee, MD [place] MD ELECTRONICALLY SIGNED 04/21/2011 58:25

## 2014-07-03 NOTE — Discharge Summary (Signed)
PATIENT NAME:  Cassie Ross, WALLA MR#:  [id number] DATE OF BIRTH:  September 08, 1960  DATE OF ADMISSION:  07/30/2011 DATE OF DISCHARGE:  07/31/2011  PRESENTING COMPLAINT: Wheezing and shortness of breath.   DISCHARGE DIAGNOSES:  1. Acute asthma exacerbation.  2. Type 2 diabetes.   CONDITION ON DISCHARGE: Sats on discharge 92% to 94% on room air.   MEDICATIONS:  1. Prednisone taper starting with 60 mg p.o. daily, taper by 5 mg daily, then stop.  2. Zithromax 250 p.o. daily for four more days.  3. Theophylline-24 extended-release 1 capsule daily.  4. Cardizem CD 180 mg twice a day.  5. Budesonide/formoterol 160/4.5 mcg per inhalation 1 puff inhalation b.i.d.  6. Ventolin HFA 90 mcg/inhalation 1 inhalation every six hours p.r.n.  7. Lasix 80 mg twice a day.  8. Aspirin 81 mg daily.  9. Nitro-Dur 0.4 mg sublingual as needed.  10. One-A-Day vitamin.  11. K-Dur 10, two tablets twice a day.  12. Atorvastatin 10 mg at bedtime.  13. Flonase one spray twice a day 50 mcg/inhalation.  14. Albuterol-ipratropium DuoNebs as needed.  15. Xanax 0.5 mg tablet, 0.25 in the morning and 0.5 at bedtime.  16. Carafate 1 gram 4 times a day.   FOLLOWUP: Follow up with Dr. Devona Konig as needed.   LABORATORY, RADIOLOGICAL AND DIAGNOSTIC DATA: Blood cultures negative in 8 to 12 hours. Hemoglobin A1c 6.3. CBC within normal limits. Basic metabolic panel within normal limits. Chest x-ray consistent with increased density in the left lung base suggestive of atelectasis versus early pneumonia.   HOSPITAL COURSE: [name] is 54 year old pleasant Caucasian female with past medical history of asthma, who comes to the Emergency Room with:  1. Severe persistent asthma with exacerbation. The patient was started on high-dose IV steroids,  nebulizer around the clock along with oral inhalers and p.o. Zithromax empirically. The patient improved overnight. No wheezing is heard today and her vitals remained stable and she feels a  lot better. She will finish up that steroid taper along with antibiotics and followup with Dr. Humphrey Rolls as outpatient.  2. History of chronic lower extremity edema for which she is on Lasix.  3. Gastroesophageal reflux disease. Continue PPI.  4. Type 2 diabetes, diet controlled.  5. Hyperlipidemia.   Overall, the patient improved clinically. Her saturations are 92% to 94% on room air. The patient will be discharged to home and follow-up with Dr. Humphrey Rolls as outpatient.   TIME SPENT: 40 minutes.    ____________________________ Hart Rochester Posey Pronto, MD sap:ap D: 07/31/2011 14:55:50 ET T: 08/01/2011 12:32:08 ET JOB#: [id number]  cc: Jonella Redditt A. Posey Pronto, MD, <Dictator> Allyne Gee, MD Ilda Basset MD ELECTRONICALLY SIGNED 08/09/2011 22:46

## 2014-07-03 NOTE — Discharge Summary (Signed)
PATIENT NAME:  Cassie Ross, WORTHY MR#:  [id number] DATE OF BIRTH:  1960-03-15  DATE OF ADMISSION:  04/08/2011 DATE OF DISCHARGE:  04/11/2011  DISCHARGE DIAGNOSES:  1. Asthma exacerbation. History of severe asthma with multiple intubations before.  2. Hiatal hernia.  3. Hyperglycemia due to steroids.  4. Gastroesophageal reflux disease.  5. Hypertension.   NEW MEDICATIONS:  1. Combivent inhaler 4 times daily as needed. 2. Xanax 0.5 mg p.o. daily.  3. Aspirin 81 mg p.o. daily.  4. Atorvastatin 10 mg p.o. daily.  5. Budesonide formoterol 160/4.5 inhalation 1 puff b.i.d.  6. Cardizem CD 180 mg p.o. b.i.d.  7. Flonase one spray in each nostril 2 times daily.  8. Lasix 80 mg p.o. b.i.d.  9. K-Dur 10 mEq, 2 tablets p.o. b.i.d.  10. Menopause vitamin 1 tablet daily.  11. Theophylline 300 mg 1 tablet daily.  12. Ventolin 90 mcg 1 puff every 4 to 6 hours p.r.n. for wheezing.  13. Prednisone dose taper as directed.   CONSULTATIONS: Pulmonary consult with Dr. Devona Konig.   LABORATORY DATA:  Chest x-ray on 04/08/2011 showed no acute cardiopulmonary abnormality. Theophylline level 9.3 on admission. Troponin less than 0.02.   Electrolytes:  Sodium 143, potassium 3.8, chloride 107, bicarbonate 27, BUN 7, creatinine 0.55, glucose 127. WBC on admission 8, hemoglobin 13.3, hematocrit 38.5, platelets 230. BNP 34. The patient's EKG showed ventricular rate of 104 with sinus tachycardia.  CT of the chest showed no evidence of pulmonary emboli and normal adrenals.  Lungs are clear. Barium  swallow showed small sliding hernia. No reflux.     HOSPITAL COURSE:  1. This is a 54 year old female patient with history of severe asthma and also intubations before came in because of persistent cough and trouble breathing. The patient also had some phlegm.  See the History and Physical for full details. The patient was treated with prednisone and antibiotics, but after finishing the prednisone started to have trouble  breathing and cough, which was recurrent so she was admitted for asthma exacerbation, started on IV Solu-Medrol along with antibiotics. The patient had a history of gastroesophageal reflux disease and a very large hiatal hernia for which she had surgery. There was a question of recurrence of hiatal hernia causing asthma exacerbation, so she had an MBS done which showed small hernia. Her asthma exacerbation got better with IV steroids, nebulizers, and also continued theophylline along with antibiotics. Dr. Humphrey Rolls saw the patient and recommended GI reevaluation. The patient does see Dr. Gustavo Lah and she can follow up with him as an outpatient. She feels much better. She is off the oxygen.  Her cough has gotten much better.  Lungs have some wheezing but she said she is able to walk without oxygen and feels less short of breath and ready to go home. She actually was given Levaquin and azithromycin while she was here. Levaquin is listed as an allergy but she did tolerate the Levaquin without any rash or anaphylaxis so she will be getting Levaquin for another three days and azithromycin another three days to finish. Levaquin will be 500 mg daily for three days and also azithromycin 500 mg daily for three days. The patient was advised about weight loss and diet due to obesity and hiatal hernia.  2. Hypertension. Blood pressure has been stable at 120/80. The patient is continued on her Cardizem.  3. She had blood sugars elevated to about 180s-200s due to steroids, but she has no history of diabetes.  4. The  patient's condition is stable and she will see Dr. Devona Konig in a week and also Dr. Gustavo Lah of GI in 1 to 2 weeks regarding hiatal hernia.    TIME SPENT ON DISCHARGE PREPARATION: More than 30 minutes.   ____________________________ Epifanio Lesches, MD sk:bjt D:  04/11/2011 12:21:43 ET          T: 04/11/2011 14:35:30 ET         JOB#: [id number]  cc: Allyne Gee, MD Lollie Sails, MD Epifanio Lesches MD ELECTRONICALLY SIGNED 04/29/2011 16:19

## 2014-07-10 NOTE — H&P (Signed)
PATIENT NAME:  Cassie Ross, BORDEAUX MR#:  [id number] DATE OF BIRTH:  Aug 06, 1960  DATE OF ADMISSION:  05/11/2014  PRIMARY CARE PHYSICIAN: Lavera Guise, MD  PULMONOLOGIST: Allyne Gee, MD  CHIEF COMPLAINT: Shortness of breath.   HISTORY OF PRESENT ILLNESS: This is a 54 year old female who presents to the hospital complaining of progressive shortness of breath over the past week to 2 weeks. The patient has a history of severe persistent asthma and is followed by Dr. Humphrey Rolls. The patient went to see Dr. Humphrey Rolls about a week to 10 days ago and was started on a prednisone taper along with doxycycline. The patient has a few more days of her doxycycline left, although her shortness of breath and her symptoms have not improved. She admits to a cough productive with about clear yellow sputum, positive chills but no documented fever. No chest pain. No nausea, no vomiting. No abdominal pain and no other associated symptoms presently. The patient presented to the ER and was noted to be in acute respiratory failure with hypoxia, likely secondary to asthma exacerbation. The patient was given IV steroids, nebulizer treatments, placed on BiPAP. Hospitalist services were contacted for further treatment and evaluation.   REVIEW OF SYSTEMS: CONSTITUTIONAL: No documented fever. No weight gain or weight loss.  EYES: No blurred or double vision.  ENT: No tinnitus. No postnasal drip. No redness of the oropharynx.  RESPIRATORY: Positive cough. Positive wheeze. No hemoptysis. Positive asthma.  CARDIOVASCULAR: No chest pain, no orthopnea, no palpitations, no syncope.  GASTROINTESTINAL: No nausea. No vomiting. No diarrhea. No abdominal pain. No melena or hematochezia.  GENITOURINARY: No dysuria or hematuria.  ENDOCRINE: No polyuria or nocturia. No heat or cold intolerance.  HEMATOLOGIC: No anemia, no bruising, no bleeding.  INTEGUMENTARY: No rashes. No lesions.  MUSCULOSKELETAL: No arthritis, no swelling, no gout.  NEUROLOGIC:  No numbness or tingling. No ataxia. No seizure-type activity.   PSYCHIATRIC: Positive anxiety. No insomnia. No ADD disorder.   PAST MEDICAL HISTORY: Consistent with asthma, severe persistent hypertension, diabetes, hyperlipidemia, GERD.    SOCIAL HISTORY: No smoking. No alcohol abuse. No illicit drug abuse. Lives at home with her husband.   FAMILY HISTORY: The patient's mother and father are both deceased. Both died from cancer. Father died from colorectal cancer. Mother died from cancer of unknown type.   CURRENT MEDICATIONS: As follows: Xanax 0.5 mg in the morning, Xanax 0.5 mg at bedtime, aspirin 81 mg daily, atorvastatin 10 mg at bedtime, calcium with vitamin D 1 tab b.i.d., diltiazem CD 180 mg b.i.d., Lasix 80 mg b.i.d., Humulin Regular per sliding scale, potassium 10 mEq b.i.d., loratadine 10 mg daily, multivitamin daily, Protonix 40 mg daily, Singulair 10 mg daily, sucralfate 1 gram q.i.d., Symbicort 160/4.5, one puff b.i.d., theophylline 300 mg daily, albuterol inhaler 4 times daily as needed.   PHYSICAL EXAMINATION: Presently is as follows:  VITAL SIGNS: Temperature is 99.2, pulse 92, respirations 22, blood pressure 138/79. Oxygen saturation 97% on BiPAP.  GENERAL: She is a pleasant-appearing female but in mild to moderate respiratory distress. HEAD, EYES, EARS, NOSE AND THROAT: Atraumatic, normocephalic. Extraocular muscles are intact. Pupils are equal and reactive to light. Sclerae anicteric. No conjunctival injection. No pharyngeal erythema.  NECK: Supple. There is no jugular venous distention. No bruits, no lymphadenopathy, no thyromegaly.  HEART: Regular rhythm, tachycardic. No murmurs. No rubs. No clicks.  LUNGS: She has diffuse end expiratory wheezing. Positive use of accessory muscles. No dullness to percussion.  ABDOMEN: Soft, flat, nontender, nondistended. Has  good bowel sounds. No hepatosplenomegaly appreciated.  EXTREMITIES: No evidence of any cyanosis, clubbing, or peripheral  edema. Has +2 pedal and radial pulses bilaterally.  NEUROLOGIC: The patient is alert, awake and oriented x3 with no focal motor or sensory deficits appreciable.  SKIN: Moist and warm with no rashes appreciated.  LYMPHATIC: There is no cervical or axillary lymphadenopathy.   LABORATORY DATA: Serum glucose of 49, BUN 9, creatinine 0.7, sodium 142, potassium 4.1, chloride 107, bicarbonate 27. The patient's LFTs are within limits. Troponin 0.02. White cell count 9.3, hemoglobin 14.6, hematocrit 43.5, platelet count 52,000. ABG showed pH 7.42, pCO2 42 and pO2 of 455 on BiPAP with saturation 100%.   The patient also had a chest x-ray done, which showed no acute cardiopulmonary process.   ASSESSMENT AND PLAN: This is a 54 year old female with a history of severe persistent asthma, hypertension, diabetes, hyperlipidemia, GERD, who presents to the hospital with shortness of breath, wheezing, noted to be in acute respiratory failure with hypoxia and asthma exacerbation.   1.   Acute respiratory failure with hypoxia. This is likely secondary to asthma exacerbation. We will start the patient on IV steroids, around-the-clock nebulizer treatments, place on Pulmicort nebulizers. Continue his Symbicort and theophylline. Continue BiPAP support for now and follow serial ABG. We will get a pulmonary consult. The patient is well-known to Dr. Humphrey Rolls.  2.  Asthma exacerbation. The exact etiology of this is unclear, questionable if this is a viral bronchitis. The patient's chest x-ray is negative for pneumonia. I will treat the patient, as mentioned above, with IV steroids, around-the-clock nebulizer treatments, Pulmicort nebulizers. Continue Symbicort. Continue her Singulair and theophylline. We will add empiric antibiotics with ceftriaxone and Zithromax. We will get a pulmonary consult. The patient is known to Dr. Humphrey Rolls, as mentioned.  3.  Hypertension. Continue Cardizem. Follow hemodynamics.  4.  Diabetes. Will place the  patient on an insulin sliding scale. Follow blood sugars. 5.  Hyperlipidemia. Continue atorvastatin.  6.  Gastroesophageal reflux disease. Continue Protonix and Carafate.  7.  Anxiety. Continue Xanax.   CODE STATUS: The patient is a full code.   CRITICAL CARE TIME SPENT: 50 minutes    ____________________________ Belia Heman. Verdell Carmine, MD vjs:je D: 05/11/2014 13:45:07 ET T: 05/11/2014 14:18:38 ET JOB#: [id number]  cc: Belia Heman. Verdell Carmine, MD, <Dictator> Henreitta Leber MD ELECTRONICALLY SIGNED 05/11/2014 15:35

## 2014-07-10 NOTE — Discharge Summary (Signed)
PATIENT NAME:  Cassie Ross, Cassie Ross MR#:  [id number] DATE OF BIRTH:  1960/05/22  DATE OF ADMISSION:  05/11/2014 DATE OF DISCHARGE:  05/15/2014  PRIMARY CARE PHYSICIAN: Lavera Guise, MD  FINAL DIAGNOSES:  1.  Acute respiratory failure with hypoxia.  2.  Asthma exacerbation.  3.  Essential hypertension.  4.  Diabetes with elevated blood sugars with steroids.  5.  Anxiety.  6.  Hyperlipidemia, unspecified.   MEDICATIONS ON DISCHARGE: Include aspirin 81 mg daily, Klor-Con 10 mEq 1 tablet twice a day, One a Day Woman's multivitamin 1 tablet daily, theophylline 300 mg extended-release 1 capsule daily, diltiazem extended release 180 mg twice a day, Protonix 40 mg daily, Singulair 10 mg daily, loratadine 10 mg daily, calcium and vitamin D 600 mg/200 international units 1 tablet twice a day, furosemide 80 mg twice a day, atorvastatin 10 mg at bedtime, Symbicort 160/4.5 one puff twice a day, Carafate 1 gram 4 times a day, Ventolin HFA 2 puffs 4 times a day as needed for shortness of breath/wheezing, Xanax 0.5 mg once a day at bedtime, Humulin-R sliding scale 4 times a day, Xanax 0.5 mg in the morning. Prednisone taper 10 mg, 5 tablets day one, 4 tablets day two, 3 tablets day three, 2 tablets day four, 1 tablet day five, 1/2 tablet day six and seven. Spiriva Respimat 2.5 mcg/inhalations 2 puffs once a day.   FOLLOWUP: With Dr. Humphrey Rolls pulmonary this week; 1-2 weeks with Dr. Clayborn Bigness.   DIET: Low-sodium diet, regular consistency.   HOSPITAL COURSE: The patient was admitted 05/11/2014, discharged 05/15/2014. The patient came in with shortness, was admitted with acute respiratory failure with hypoxia, initially requiring BiPAP. Pulmonary consultation was ordered for Dr. Humphrey Rolls to see the patient. The patient was admitted with asthma exacerbation, started on IV steroids and nebulizer treatments. Initially started on ceftriaxone and Rocephin.   LABORATORY AND RADIOLOGICAL DATA DURING THE HOSPITAL COURSE: Included  blood cultures that were negative. EKG showed low voltage. Troponin was negative. Glucose 122, BUN 9, creatinine 0.71, sodium 142, potassium 4.1, chloride 107, CO2 of 27, calcium 8.7. Liver function tests: Alkaline phosphatase slightly elevated at 123. Other liver function tests normal range. White blood cell count 9.3, H and H 14.6 and 43.5, platelet count of 252,000. ABG showed a pH of 7.42, pCO2 of 42, pO2 of 455; that was on 100% oxygen on BiPAP. Chest x-ray showed no active disease.  HOSPITAL COURSE PER PROBLEM LIST:  1.  For the patient's acute respiratory failure with hypoxia, initially requiring BiPAP on presentation, the patient was weaned off oxygen totally and does not need any oxygen upon going home. Respiratory status had improved.  2.  Asthma exacerbation. The patient felt much better than when she came in, able to take a deep breath without coughing. She still does have a little bit of a wheeze but is able to take a deeper breath and wanted to be treated as outpatient. She finished her entire Zithromax course here in the hospital. Pulmonary consultation was ordered for Dr. Humphrey Rolls. I did not see a note, so over the weekend I spoke with Dr. Mortimer Fries to see the patient, he added Spiriva. She will go back on Symbicort, Spiriva and her Ventolin HFA. Here in the hospital we did give IV Solu-Medrol which I increased on March 5 and switched over to a prednisone taper here. We did give budesonide nebulizers and nebulizer treatments while in the hospital. She will go back on her Symbicort as outpatient.  3.  Essential hypertension. This has remained stable. No changes in medications.  4.  Diabetes, elevated sugars with steroids. She is on sliding scale at home.  5.  Anxiety, on Xanax.  6.  Hyperlipidemia, on atorvastatin.  7.  Gastroesophageal reflux disease without esophagitis, on Protonix.  TIME SPENT ON DISCHARGE: Thirty-five minutes.    ____________________________ Tana Conch. Leslye Peer,  MD rjw:TM D: 05/15/2014 14:10:00 ET T: 05/15/2014 17:25:29 ET JOB#: [id number]  cc: Tana Conch. Leslye Peer, MD, <Dictator> Lavera Guise, MD Allyne Gee, MD Marisue Brooklyn MD ELECTRONICALLY SIGNED 05/18/2014 10:53

## 2014-07-10 NOTE — H&P (Signed)
PATIENT NAME:  Cassie Ross, Cassie Ross MR#:  [id number] DATE OF BIRTH:  Sep 12, 1960  DATE OF ADMISSION:  06/26/2014  REFERRING PHYSICIAN: Gretchen Short. Beather Arbour, MD  PRIMARY CARE PRACTITIONER: Lavera Guise, MD  ADMITTING PHYSICIAN: Juluis Mire, MD   CHIEF COMPLAINT:  1.  Shortness of breath with wheezing.  2.  Cough with productive sputum.   HISTORY OF PRESENT ILLNESS: A 55 year old Caucasian female with a history of bronchial asthma, hypertension, diabetes mellitus on insulin, gastroesophageal reflux disease, hyperlipidemia, anxiety disorder, presents with the complaints of shortness of breath with wheezing of 1 day duration, needing increased use of nebulizers at home following cough with expectoration of white sputum following exposure to her sick grandchild with URI symptoms. The patient stated that she was exposed to her grandchild who was sick with upper respiratory symptoms a couple of days ago, following which she developed cough with productive sputum a day earlier and today she is developing increasing shortness of breath with wheezing and has been using her nebulizers at home more frequently but continued to have diffuse wheezing, hence came to the Emergency Room for further evaluation. In the Emergency Room on arrival, the patient was noted to be in acute respiratory distress with hypoxia with room air oxygen saturations 85%, was placed on BiPAP by the ED physician and was given oxygen supplementation, vigorous DuoNebs and IV Solu-Medrol. Workup revealed elevated white blood cell count of 16.8 and a chest x-ray was negative for any acute cardiopulmonary pathology. ABG was significant for hypoxia. Following placement on BiPAP, her respiratory status started improving but still she is needs oxygen and she still had diffuse wheezing bilaterally. No history of any chest pain. Denies any fever, but she felt feverishness. No dizziness or loss of consciousness. No nausea, vomiting, diarrhea. No abdominal  pain. No urinary symptoms. The patient was recently admitted to [facility] last month, in March 2016, with the same complaints secondary to acute exacerbation of bronchial asthma to critical care unit.   PAST MEDICAL HISTORY:  1.  Hypertension.  2.  Bronchial asthma.  3.  Diabetes mellitus on insulin.  4.  Gastroesophageal reflux disease.   5.  Hyperlipidemia.  6.  Anxiety disorder.   PAST SURGICAL HISTORY:  1.  Cholecystectomy.  2.  Cataract extraction.  3.  Sinus surgery.   ALLERGIES:  1.  LEVAQUIN.  2.  PENICILLIN.  3.  SUNFLOWER SEEDS.  4.  SHELLFISH.   FAMILY HISTORY: Both father and mother died of some cancers.   SOCIAL HISTORY: She is married, lives with her husband at home. No history of any smoking, alcohol or substance abuse.   HOME MEDICATIONS:  1.  Alprazolam 0.5 mg 1 tablet orally once a day at bedtime.  2.  Aspirin 81 mg 1 tablet orally once a day.  3.  Atorvastatin 10 mg 1 tablet orally once a day.  4.  Calcium with vitamin D 1 tablet orally 2 times a day.  5.  Diltiazem 180 mg extended-release capsule 2 times a day.  6.  Fluticasone nasal spray 1 spray each nostril once a day.  7.  Furosemide 80 mg tablet 1 tablet orally 2 times a day as needed for edema.  8.   Humulin insulin 3 times a day as needed per sliding scale.  9.  Loratadine 10 mg 1 tablet orally once a day.  10.  Montelukast 10 mg 1 tablet orally once a day.  11.  One-A-Day Women multivitamin tablet 1 a day.  12.  Pantoprazole 40 mg 1 tablet orally once a day.  13.  Potassium chloride 10 mEq extended-release tablet 1 tablet 2 times a day.  14.  Spiriva 2.5 mcg inhalation 1 puff daily once a day.  15.  Sucralfate 1 gram oral tablet 1 tablet 4 times a day.  16.  Symbicort 160/4 mcg inhalation 2 puffs inhaled 2 times a day.   17.  Theophylline 300 mg oral tablet extended-release 1 tablet orally once a day in the morning.  18.  Ventolin inhalation 2 puffs inhaled 4 times a day as needed for shortness of  breath.   REVIEW OF SYSTEMS:  CONSTITUTIONAL: Negative for fever or chills. Negative for fatigue or generalized weakness.  EYES: Negative for blurred vision, double vision. No pain. No redness. No discharge.  EARS, NOSE, AND THROAT: Negative for tinnitus, ear pain, hearing loss, epistaxis, nasal discharge.  RESPIRATORY: Positive for cough with expectoration of 2 days' duration and shortness of breath with wheezing for 1-2 days' duration. No hemoptysis. No painful respirations.  CARDIOVASCULAR: Negative for chest pain, palpitations, dizziness, syncopal episodes, orthopnea, dyspnea on exertion, or pedal edema.  GASTROINTESTINAL: Negative for nausea, vomiting, diarrhea, constipation, abdominal pain, hematemesis, melena. History of chronic GERD symptoms, stable on daily PPI.  GASTROINTESTINAL: Negative for dysuria, frequency, urgency, hematuria.  ENDOCRINE: Negative for polyuria, nocturia, heat or cold intolerance.  HEMATOLOGIC AND LYMPHATIC: Negative for anemia, easy bruising, bleeding, swollen glands.  INTEGUMENTARY: Negative for acne, skin rash, or lesions.  MUSCULOSKELETAL: Negative for neck or back pain. No shoulder pain. No history of arthritis or gout.  NEUROLOGICAL: Negative for focal weakness or numbness. No history of CVA, TIA, seizure disorder. PSYCHIATRIC: History of anxiety disorder, stable on p.r.n. Xanax.   PHYSICAL EXAMINATION:  VITAL SIGNS: Temperature 98.8 degrees Fahrenheit; pulse rate on admission 120 per minute; respirations 26 per minute; blood pressure 177/92 on arrival, current blood pressure 158/88; oxygen saturation 85% on room air on arrival, currently at 95% on BiPAP 40% oxygen.  GENERAL: Well developed, well nourished, alert, wearing BiPAP, in mild distress, otherwise comfortable in the bed.  HEAD: Atraumatic, normocephalic.  EYES: Pupils equal, react to light and accommodation. No conjunctival pallor. No scleral icterus. Extraocular movements intact.   NOSE: No  drainage. No lesions.  EARS: No drainage.  ORAL CAVITY: No mucosal lesions. No exudates.  NECK: Supple. No JVD. No thyromegaly. No carotid bruits. Range of motion of neck normal.  RESPIRATORY: Bilateral diffuse rhonchi present. Not using accessory muscles of respiration. No rales.  CARDIOVASCULAR: S1, S2 regular. Tachycardia present. No murmurs, gallops, or clicks. Pulses equal at carotid, femoral, and pedal pulses. No peripheral edema.  GASTROINTESTINAL: Abdomen soft, nontender. No hepatosplenomegaly. No masses. No rigidity. No guarding. Bowel sounds present and equal in all 4 quadrants.  GENITOURINARY: Deferred.   MUSCULOSKELETAL: No joint tenderness or effusion. Range of motion adequate. Strength and tone equal bilaterally.  SKIN: Inspection within normal limits.  LYMPHATIC: No cervical lymphadenopathy.  VASCULAR: Good dorsalis pedis and posterior tibial pulses.  NEUROLOGIC: Alert, awake, and oriented x 3.  Cranial nerves II-XII grossly intact. No sensory deficit. Motor strength 5/5 in both upper and lower extremities. DTRs 2+ bilaterally and symmetrical. Plantars downgoing.  PSYCHIATRIC: Alert, awake, and oriented x 3. Judgment, insight adequate. Memory and mood within normal limits.   ANCILLARY DATA:  LABORATORY DATA: Serum glucose 162, the rest of metabolic panel normal. Troponin less than 0.03. WBC 16.8.    ABG: PH of 7.37, pCO2 of 48, pO2 of 70, oxygen  saturation 96.7%, this is on BiPAP FiO2 of 35%.   Chest x-ray: Negative for acute cardiopulmonary findings.   EKG: Sinus tachycardia with ventricular rate of 126 beats per minute, no acute ST-T changes.    ASSESSMENT AND PLAN: A 54 year old Caucasian female with a history of bronchial asthma, hypertension, diabetes mellitus on insulin, hyperlipidemia, gastroesophageal reflux disease, anxiety disorder, presents with the complaints of shortness of breath with wheezing following cough with productive sputum, needing increased use of  nebulizers at home of 1 day duration following exposure to her sick grandchild at home.  1.  Acute respiratory failure with hypoxia secondary to acute exacerbation of bronchial asthma. Plan: Admit to stepdown unit. Continue oxygen supplementation through BiPAP. Monitor oxygen saturations closely. Intravenous Solu-Medrol. Vigorous DuoNebs. Sputum for cultures.  2.  Acute exacerbation of bronchial asthma secondary to acute bronchitis, viral versus bacterial. Plan: Sputum cultures. Continue care as above. Pulmonary consultation. Continue home medications.  3.  Hypertension, stable on home medications. Continue same.  4.  Diabetes mellitus on insulin. We will continue sliding scale insulin. Follow blood sugars.  5.  Hyperlipidemia, on statin. Continue same.  6.  Gastroesophageal reflux disease, stable on proton pump inhibitor. Continue same.  7.  History of anxiety disorder, stable on p.r.n. Xanax. Continue same.  8.  Deep vein thrombosis prophylaxis. Subcutaneous Lovenox.  9.  Gastrointestinal prophylaxis. Proton pump inhibitor.   CODE STATUS: Full code.   TIME SPENT: 50 minutes.    ____________________________ Juluis Mire, MD enr:bm D: 06/26/2014 02:33:30 ET T: 06/26/2014 03:34:26 ET JOB#: [id number]  cc: Juluis Mire, MD, <Dictator> Lavera Guise, MD Juluis Mire MD ELECTRONICALLY SIGNED 06/26/2014 22:08

## 2014-07-10 NOTE — Discharge Summary (Signed)
PATIENT NAME:  Cassie Ross, PRESTWOOD MR#:  [id number] DATE OF BIRTH:  1960/03/16  DATE OF ADMISSION:  06/26/2014 DATE OF DISCHARGE:  06/29/2014  ADMISSION DIAGNOSIS: Acute hypoxic respiratory failure secondary to acute asthma exacerbation.   DISCHARGE DIAGNOSES: 1. Acute hypoxic respiratory failure secondary to acute asthma exacerbation.  2. Type 2 diabetes.  3. Gastroesophageal reflux disease.  4. Essential hypertension.   CONSULTATIONS: None.   DISCHARGE PHYSICAL EXAMINATION: VITAL SIGNS: Temperature 97.9, pulse 74, respirations 18, blood pressure 125/75, 92% on room air.  GENERAL: The patient is alert, oriented, not in acute distress.  LUNGS: She has wheezing on her right and left side bilaterally with good air flow and very minimal wheezing. No rales or crackles heard.  CARDIOVASCULAR: Regular rate and rhythm. No murmurs, gallops, or rubs.  EXTREMITIES: No clubbing, cyanosis or edema.   HOSPITAL COURSE: This is a very pleasant 54 year old female with history of asthma, who presented with hypoxia and respiratory failure. For further details, please refer to the H and P.   1. Acute respiratory failure due to asthma exacerbation. The patient was initially placed on BiPAP machine. She was then weaned to oxygen. She is now being weaned off. She was placed on IV steroids. She has some mild wheezing, but good air flow. She will continue with her steroid taper, antibiotics, nebulizers and inhalers. She is also on theophylline, which she was on at home. She will follow up with Dr. Devona Konig, her pulmonologist, in 1 week.  2. Type 2 diabetes. The patient will continue on sliding scale insulin.  3. Essential hypertension. Her blood pressure was blood well controlled on Cardizem.  4. Gastroesophageal reflux disease. The patient will continue on Protonix and sucralfate.   DISCHARGE MEDICATIONS:  1. Diltiazem 180 mg b.i.d.  2. Lasix 80 mg b.i.d. p.r.n.  3. Atorvastatin 10 mg at bedtime.   4. Sucralfate 1 gram 4 times a day.  5. Ventolin 2 puffs 4 times a day p.r.n.  6. Xanax 0.5 mg at bedtime.  7. Pantoprazole 40 mg daily.  8. Aspirin 81 mg daily.  9. Multivitamin daily.  10. Calcium plus D 1 tablet b.i.d.  11. Montelukast 10 mg daily.  12. Loratadine 10 mg daily.  13. Symbicort 2 puffs b.i.d.  14. Theophylline 300 mg daily.  15. KCl 10 mEq b.i.d.  16. Humulin sliding scale insulin.  17. Spiriva 1 puff daily.  18. Fluticasone nasal spray in the morning.  19. Azithromycin 500 mg daily for 5 days.  20. Prednisone taper starting at 60 mg, taper x 10 mg every 2 days.   DISCHARGE DIET: Low sodium ADA diet.   DISCHARGE ACTIVITY: As tolerated.   DISCHARGE FOLLOWUP: The patient will follow up with Clayborn Bigness, MD in 1 week, as well as Allyne Gee, MD.   TIME SPENT: Approximately 35 minutes.   ____________________________ Donell Beers. Benjie Karvonen, MD spm:AT D: 06/29/2014 13:01:16 ET T: 06/29/2014 17:14:35 ET JOB#: [id number]  cc: Vali Capano P. Benjie Karvonen, MD, <Dictator> Allyne Gee, MD Lavera Guise, MD Donell Beers Zynasia Burklow MD ELECTRONICALLY SIGNED 06/29/2014 21:36

## 2014-07-20 DIAGNOSIS — J4551 Severe persistent asthma with (acute) exacerbation: Secondary | ICD-10-CM | POA: Diagnosis not present

## 2014-07-20 DIAGNOSIS — J4550 Severe persistent asthma, uncomplicated: Secondary | ICD-10-CM | POA: Diagnosis not present

## 2014-07-20 DIAGNOSIS — E119 Type 2 diabetes mellitus without complications: Secondary | ICD-10-CM | POA: Diagnosis not present

## 2014-07-20 DIAGNOSIS — J309 Allergic rhinitis, unspecified: Secondary | ICD-10-CM | POA: Diagnosis not present

## 2014-07-20 DIAGNOSIS — I10 Essential (primary) hypertension: Secondary | ICD-10-CM | POA: Diagnosis not present

## 2014-08-01 DIAGNOSIS — J4550 Severe persistent asthma, uncomplicated: Secondary | ICD-10-CM | POA: Diagnosis not present

## 2014-10-31 ENCOUNTER — Ambulatory Visit
Admission: RE | Admit: 2014-10-31 | Discharge: 2014-10-31 | Disposition: A | Discharge status: Home / Self Care | Payer: Commercial Managed Care - HMO | Source: Ambulatory Visit | Attending: Internal Medicine | Admitting: Internal Medicine

## 2014-10-31 ENCOUNTER — Other Ambulatory Visit: Payer: Self-pay | Admitting: Internal Medicine

## 2014-10-31 DIAGNOSIS — F411 Generalized anxiety disorder: Secondary | ICD-10-CM | POA: Diagnosis not present

## 2014-10-31 DIAGNOSIS — M25462 Effusion, left knee: Secondary | ICD-10-CM | POA: Diagnosis not present

## 2014-10-31 DIAGNOSIS — J441 Chronic obstructive pulmonary disease with (acute) exacerbation: Secondary | ICD-10-CM | POA: Diagnosis not present

## 2014-10-31 DIAGNOSIS — R52 Pain, unspecified: Secondary | ICD-10-CM

## 2014-10-31 DIAGNOSIS — M25562 Pain in left knee: Secondary | ICD-10-CM | POA: Diagnosis not present

## 2014-10-31 DIAGNOSIS — E119 Type 2 diabetes mellitus without complications: Secondary | ICD-10-CM | POA: Diagnosis not present

## 2014-10-31 DIAGNOSIS — J4550 Severe persistent asthma, uncomplicated: Secondary | ICD-10-CM | POA: Diagnosis not present

## 2014-11-02 DIAGNOSIS — M25562 Pain in left knee: Secondary | ICD-10-CM | POA: Diagnosis not present

## 2014-11-02 DIAGNOSIS — M2392 Unspecified internal derangement of left knee: Secondary | ICD-10-CM | POA: Diagnosis not present

## 2014-11-09 DIAGNOSIS — M25562 Pain in left knee: Secondary | ICD-10-CM | POA: Diagnosis not present

## 2014-11-09 DIAGNOSIS — M2392 Unspecified internal derangement of left knee: Secondary | ICD-10-CM | POA: Diagnosis not present

## 2014-11-10 ENCOUNTER — Other Ambulatory Visit: Payer: Self-pay | Admitting: Orthopedic Surgery

## 2014-11-10 DIAGNOSIS — M25562 Pain in left knee: Secondary | ICD-10-CM

## 2014-11-10 DIAGNOSIS — M2392 Unspecified internal derangement of left knee: Secondary | ICD-10-CM

## 2014-11-19 ENCOUNTER — Ambulatory Visit
Admission: RE | Admit: 2014-11-19 | Discharge: 2014-11-19 | Disposition: A | Discharge status: Home / Self Care | Payer: Commercial Managed Care - HMO | Source: Ambulatory Visit | Attending: Orthopedic Surgery | Admitting: Orthopedic Surgery

## 2014-11-19 DIAGNOSIS — M1712 Unilateral primary osteoarthritis, left knee: Secondary | ICD-10-CM | POA: Diagnosis not present

## 2014-11-19 DIAGNOSIS — M25562 Pain in left knee: Secondary | ICD-10-CM | POA: Diagnosis present

## 2014-11-19 DIAGNOSIS — M2392 Unspecified internal derangement of left knee: Secondary | ICD-10-CM | POA: Diagnosis present

## 2014-11-19 DIAGNOSIS — M238X2 Other internal derangements of left knee: Secondary | ICD-10-CM | POA: Insufficient documentation

## 2014-11-19 DIAGNOSIS — M23304 Other meniscus derangements, unspecified medial meniscus, left knee: Secondary | ICD-10-CM | POA: Insufficient documentation

## 2014-11-19 DIAGNOSIS — M179 Osteoarthritis of knee, unspecified: Secondary | ICD-10-CM | POA: Diagnosis not present

## 2014-11-19 DIAGNOSIS — R600 Localized edema: Secondary | ICD-10-CM | POA: Diagnosis not present

## 2014-11-19 DIAGNOSIS — M25462 Effusion, left knee: Secondary | ICD-10-CM | POA: Insufficient documentation

## 2014-11-24 DIAGNOSIS — G8929 Other chronic pain: Secondary | ICD-10-CM | POA: Diagnosis not present

## 2014-11-24 DIAGNOSIS — M25562 Pain in left knee: Secondary | ICD-10-CM | POA: Diagnosis not present

## 2014-12-27 DIAGNOSIS — M1712 Unilateral primary osteoarthritis, left knee: Secondary | ICD-10-CM | POA: Insufficient documentation

## 2015-01-30 ENCOUNTER — Ambulatory Visit
Admission: RE | Admit: 2015-01-30 | Discharge: 2015-01-30 | Disposition: A | Discharge status: Home / Self Care | Payer: Commercial Managed Care - HMO | Source: Ambulatory Visit | Attending: Nurse Practitioner | Admitting: Nurse Practitioner

## 2015-01-30 ENCOUNTER — Other Ambulatory Visit: Payer: Self-pay | Admitting: Nurse Practitioner

## 2015-01-30 DIAGNOSIS — E119 Type 2 diabetes mellitus without complications: Secondary | ICD-10-CM | POA: Diagnosis not present

## 2015-01-30 DIAGNOSIS — I10 Essential (primary) hypertension: Secondary | ICD-10-CM | POA: Diagnosis not present

## 2015-01-30 DIAGNOSIS — Z87828 Personal history of other (healed) physical injury and trauma: Secondary | ICD-10-CM

## 2015-01-30 DIAGNOSIS — J4550 Severe persistent asthma, uncomplicated: Secondary | ICD-10-CM | POA: Diagnosis not present

## 2015-01-30 DIAGNOSIS — M549 Dorsalgia, unspecified: Secondary | ICD-10-CM | POA: Diagnosis not present

## 2015-01-30 DIAGNOSIS — F411 Generalized anxiety disorder: Secondary | ICD-10-CM | POA: Diagnosis not present

## 2015-01-30 DIAGNOSIS — J309 Allergic rhinitis, unspecified: Secondary | ICD-10-CM | POA: Diagnosis not present

## 2015-01-30 DIAGNOSIS — M545 Low back pain: Secondary | ICD-10-CM | POA: Diagnosis not present

## 2015-02-08 DIAGNOSIS — H3554 Dystrophies primarily involving the retinal pigment epithelium: Secondary | ICD-10-CM | POA: Diagnosis not present

## 2015-03-17 ENCOUNTER — Other Ambulatory Visit: Payer: Self-pay

## 2015-03-17 NOTE — Patient Outreach (Signed)
[place] [facility]) Care Management  03/17/2015  Cassie Ross 02/28/1961 [id number]   Telephone Screen  Referral Date: 03/14/15 Referral Source: [facility] tier 4 list Referral Reason: DM, COPD, 2 Ed visits and 2 hospital admits   Outreach attempt # 1 to patient. No answer at present. RN CM left HIPPA compliant voicemail message for patient along with contact info.   Plan: RN CM will make outreach attempt to patient within a week.  Enzo Montgomery, RN,BSN,CCM [place] Management Telephonic Care Management Coordinator Direct Phone: [phone] Toll Free: [phone] Fax: [phone]

## 2015-03-22 ENCOUNTER — Ambulatory Visit: Payer: Self-pay

## 2015-03-22 ENCOUNTER — Other Ambulatory Visit: Payer: Self-pay

## 2015-03-22 NOTE — Patient Outreach (Signed)
[place] [facility]) Care Management  03/22/2015  ROLANDE SJOSTROM 1960-11-03 [id number]   Telephone Screen  Referral Date: 03/14/15 Referral Source: [facility] tier 4 list Referral Reason: DM, COPD, 2 Ed visits and 2 hospital admits   Outreach attempt # 2 to patient. No answer. RN CM left HIPAA compliant voicemail message along with contact info for patient.   Plan: RN CM will attempt outreach call to patient within a week.  Enzo Montgomery, RN,BSN,CCM [place] Management Telephonic Care Management Coordinator Direct Phone: [phone] Toll Free: [phone] Fax: [phone]

## 2015-03-29 ENCOUNTER — Other Ambulatory Visit: Payer: Self-pay

## 2015-03-29 ENCOUNTER — Ambulatory Visit: Payer: Self-pay

## 2015-03-29 NOTE — Patient Outreach (Signed)
[place] [facility]) Care Management  03/29/2015  AVEN PURINTON [birth year] [id number]   Telephone Screen  Referral Date: 03/14/15 Referral Source: [facility] tier 4 list Referral Reason: DM, COPD, 2 Ed visits and 2 hospital admits   Outreach attempt # 3 to patient. No answer. RN CM left HIPAA compliant voicemail message.   Plan: RN CM will send unsuccessful outreach letter to patient. RN CM will close case if no response from patient within 10 business days.  Enzo Montgomery, RN,BSN,CCM [place] Management Telephonic Care Management Coordinator Direct Phone: [phone] Toll Free: [phone] Fax: [phone]

## 2015-04-12 ENCOUNTER — Other Ambulatory Visit: Payer: Self-pay

## 2015-04-12 NOTE — Patient Outreach (Signed)
[place] [facility]) Care Management  04/12/2015  MURIAH SAEFONG December 25, 1960 [id number]   Telephone Screen  Referral Date: 03/14/15 Referral Source: [facility] tier 4 list Referral Reason: DM, COPD, 2 Ed visits and 2 hospital admits   Multiple attempts to establish contact with patient. No response from letter mailed to patient. Case is being closed at this time.   Plan: RN CM will notify [facility] administrative assistant of case status. RN CM will send MD case closure letter.  Enzo Montgomery, RN,BSN,CCM [place] Management Telephonic Care Management Coordinator Direct Phone: [phone] Toll Free: [phone] Fax: [phone]

## 2015-05-17 DIAGNOSIS — Z1231 Encounter for screening mammogram for malignant neoplasm of breast: Secondary | ICD-10-CM | POA: Diagnosis not present

## 2015-05-17 DIAGNOSIS — Z9289 Personal history of other medical treatment: Secondary | ICD-10-CM | POA: Diagnosis not present

## 2015-06-12 DIAGNOSIS — L409 Psoriasis, unspecified: Secondary | ICD-10-CM | POA: Diagnosis not present

## 2015-06-12 DIAGNOSIS — J309 Allergic rhinitis, unspecified: Secondary | ICD-10-CM | POA: Diagnosis not present

## 2015-06-12 DIAGNOSIS — J209 Acute bronchitis, unspecified: Secondary | ICD-10-CM | POA: Diagnosis not present

## 2015-06-12 DIAGNOSIS — F411 Generalized anxiety disorder: Secondary | ICD-10-CM | POA: Diagnosis not present

## 2015-06-12 DIAGNOSIS — I10 Essential (primary) hypertension: Secondary | ICD-10-CM | POA: Diagnosis not present

## 2015-06-12 DIAGNOSIS — E119 Type 2 diabetes mellitus without complications: Secondary | ICD-10-CM | POA: Diagnosis not present

## 2015-06-12 DIAGNOSIS — Z0001 Encounter for general adult medical examination with abnormal findings: Secondary | ICD-10-CM | POA: Diagnosis not present

## 2015-06-12 DIAGNOSIS — J4550 Severe persistent asthma, uncomplicated: Secondary | ICD-10-CM | POA: Diagnosis not present

## 2015-06-13 DIAGNOSIS — E782 Mixed hyperlipidemia: Secondary | ICD-10-CM | POA: Diagnosis not present

## 2015-06-13 DIAGNOSIS — E1165 Type 2 diabetes mellitus with hyperglycemia: Secondary | ICD-10-CM | POA: Diagnosis not present

## 2015-06-13 DIAGNOSIS — E559 Vitamin D deficiency, unspecified: Secondary | ICD-10-CM | POA: Diagnosis not present

## 2015-06-13 DIAGNOSIS — Z0001 Encounter for general adult medical examination with abnormal findings: Secondary | ICD-10-CM | POA: Diagnosis not present

## 2015-07-27 DIAGNOSIS — D692 Other nonthrombocytopenic purpura: Secondary | ICD-10-CM | POA: Diagnosis not present

## 2015-07-27 DIAGNOSIS — L814 Other melanin hyperpigmentation: Secondary | ICD-10-CM | POA: Diagnosis not present

## 2015-07-27 DIAGNOSIS — L570 Actinic keratosis: Secondary | ICD-10-CM | POA: Diagnosis not present

## 2015-07-27 DIAGNOSIS — X32XXXA Exposure to sunlight, initial encounter: Secondary | ICD-10-CM | POA: Diagnosis not present

## 2015-09-18 DIAGNOSIS — L409 Psoriasis, unspecified: Secondary | ICD-10-CM | POA: Diagnosis not present

## 2015-09-18 DIAGNOSIS — I10 Essential (primary) hypertension: Secondary | ICD-10-CM | POA: Diagnosis not present

## 2015-09-18 DIAGNOSIS — J4551 Severe persistent asthma with (acute) exacerbation: Secondary | ICD-10-CM | POA: Diagnosis not present

## 2015-09-18 DIAGNOSIS — E782 Mixed hyperlipidemia: Secondary | ICD-10-CM | POA: Diagnosis not present

## 2015-09-18 DIAGNOSIS — E119 Type 2 diabetes mellitus without complications: Secondary | ICD-10-CM | POA: Diagnosis not present

## 2015-11-30 DIAGNOSIS — I10 Essential (primary) hypertension: Secondary | ICD-10-CM | POA: Diagnosis not present

## 2015-11-30 DIAGNOSIS — E1165 Type 2 diabetes mellitus with hyperglycemia: Secondary | ICD-10-CM | POA: Diagnosis not present

## 2015-11-30 DIAGNOSIS — J4551 Severe persistent asthma with (acute) exacerbation: Secondary | ICD-10-CM | POA: Diagnosis not present

## 2015-11-30 DIAGNOSIS — J209 Acute bronchitis, unspecified: Secondary | ICD-10-CM | POA: Diagnosis not present

## 2015-12-19 DIAGNOSIS — Z5181 Encounter for therapeutic drug level monitoring: Secondary | ICD-10-CM | POA: Diagnosis not present

## 2015-12-19 DIAGNOSIS — E1165 Type 2 diabetes mellitus with hyperglycemia: Secondary | ICD-10-CM | POA: Diagnosis not present

## 2015-12-19 DIAGNOSIS — J4550 Severe persistent asthma, uncomplicated: Secondary | ICD-10-CM | POA: Diagnosis not present

## 2015-12-19 DIAGNOSIS — R0602 Shortness of breath: Secondary | ICD-10-CM | POA: Diagnosis not present

## 2015-12-19 DIAGNOSIS — I10 Essential (primary) hypertension: Secondary | ICD-10-CM | POA: Diagnosis not present

## 2016-01-08 DIAGNOSIS — J4550 Severe persistent asthma, uncomplicated: Secondary | ICD-10-CM | POA: Diagnosis not present

## 2016-01-08 DIAGNOSIS — Z23 Encounter for immunization: Secondary | ICD-10-CM | POA: Diagnosis not present

## 2016-01-08 DIAGNOSIS — G4710 Hypersomnia, unspecified: Secondary | ICD-10-CM | POA: Diagnosis not present

## 2016-01-10 DIAGNOSIS — G4710 Hypersomnia, unspecified: Secondary | ICD-10-CM | POA: Diagnosis not present

## 2016-01-29 DIAGNOSIS — J9621 Acute and chronic respiratory failure with hypoxia: Secondary | ICD-10-CM | POA: Diagnosis not present

## 2016-01-29 DIAGNOSIS — J4551 Severe persistent asthma with (acute) exacerbation: Secondary | ICD-10-CM | POA: Diagnosis not present

## 2016-01-29 DIAGNOSIS — J4550 Severe persistent asthma, uncomplicated: Secondary | ICD-10-CM | POA: Diagnosis not present

## 2016-01-29 DIAGNOSIS — R55 Syncope and collapse: Secondary | ICD-10-CM | POA: Diagnosis not present

## 2016-02-07 DIAGNOSIS — G4733 Obstructive sleep apnea (adult) (pediatric): Secondary | ICD-10-CM | POA: Diagnosis not present

## 2016-02-21 ENCOUNTER — Observation Stay
Admission: EM | Admit: 2016-02-21 | Discharge: 2016-02-22 | Disposition: A | Discharge status: Home / Self Care | Payer: Commercial Managed Care - HMO | Attending: Internal Medicine | Admitting: Internal Medicine

## 2016-02-21 ENCOUNTER — Encounter: Payer: Self-pay | Admitting: Emergency Medicine

## 2016-02-21 ENCOUNTER — Emergency Department: Payer: Commercial Managed Care - HMO

## 2016-02-21 DIAGNOSIS — I119 Hypertensive heart disease without heart failure: Secondary | ICD-10-CM | POA: Diagnosis not present

## 2016-02-21 DIAGNOSIS — Z880 Allergy status to penicillin: Secondary | ICD-10-CM | POA: Diagnosis not present

## 2016-02-21 DIAGNOSIS — E876 Hypokalemia: Secondary | ICD-10-CM | POA: Diagnosis not present

## 2016-02-21 DIAGNOSIS — E119 Type 2 diabetes mellitus without complications: Secondary | ICD-10-CM | POA: Diagnosis not present

## 2016-02-21 DIAGNOSIS — Z79899 Other long term (current) drug therapy: Secondary | ICD-10-CM | POA: Diagnosis not present

## 2016-02-21 DIAGNOSIS — R000 Tachycardia, unspecified: Secondary | ICD-10-CM | POA: Diagnosis not present

## 2016-02-21 DIAGNOSIS — K219 Gastro-esophageal reflux disease without esophagitis: Secondary | ICD-10-CM | POA: Insufficient documentation

## 2016-02-21 DIAGNOSIS — J45901 Unspecified asthma with (acute) exacerbation: Secondary | ICD-10-CM | POA: Diagnosis not present

## 2016-02-21 DIAGNOSIS — J4551 Severe persistent asthma with (acute) exacerbation: Secondary | ICD-10-CM | POA: Diagnosis present

## 2016-02-21 DIAGNOSIS — Z7982 Long term (current) use of aspirin: Secondary | ICD-10-CM | POA: Insufficient documentation

## 2016-02-21 DIAGNOSIS — E785 Hyperlipidemia, unspecified: Secondary | ICD-10-CM | POA: Insufficient documentation

## 2016-02-21 DIAGNOSIS — Z86718 Personal history of other venous thrombosis and embolism: Secondary | ICD-10-CM | POA: Insufficient documentation

## 2016-02-21 DIAGNOSIS — Z888 Allergy status to other drugs, medicaments and biological substances status: Secondary | ICD-10-CM | POA: Insufficient documentation

## 2016-02-21 DIAGNOSIS — Z91013 Allergy to seafood: Secondary | ICD-10-CM | POA: Insufficient documentation

## 2016-02-21 DIAGNOSIS — I10 Essential (primary) hypertension: Secondary | ICD-10-CM | POA: Diagnosis not present

## 2016-02-21 DIAGNOSIS — K449 Diaphragmatic hernia without obstruction or gangrene: Secondary | ICD-10-CM | POA: Insufficient documentation

## 2016-02-21 DIAGNOSIS — R0602 Shortness of breath: Secondary | ICD-10-CM | POA: Diagnosis not present

## 2016-02-21 HISTORY — DX: Unspecified asthma, uncomplicated: J45.909

## 2016-02-21 HISTORY — DX: Essential (primary) hypertension: I10

## 2016-02-21 HISTORY — DX: Heart failure, unspecified: I50.9

## 2016-02-21 LAB — CBC WITH DIFFERENTIAL/PLATELET
BASOS ABS: 0.1 10*3/uL (ref 0–0.1)
Basophils Relative: 1 %
EOS PCT: 4 %
Eosinophils Absolute: 0.6 10*3/uL (ref 0–0.7)
HEMATOCRIT: 42.1 % (ref 35.0–47.0)
Hemoglobin: 14.6 g/dL (ref 12.0–16.0)
LYMPHS ABS: 2.7 10*3/uL (ref 1.0–3.6)
LYMPHS PCT: 18 %
MCH: 31.5 pg (ref 26.0–34.0)
MCHC: 34.6 g/dL (ref 32.0–36.0)
MCV: 91 fL (ref 80.0–100.0)
MONO ABS: 1.5 10*3/uL — AB (ref 0.2–0.9)
Monocytes Relative: 10 %
NEUTROS ABS: 9.6 10*3/uL — AB (ref 1.4–6.5)
Neutrophils Relative %: 67 %
PLATELETS: 314 10*3/uL (ref 150–440)
RBC: 4.63 MIL/uL (ref 3.80–5.20)
RDW: 13.1 % (ref 11.5–14.5)
WBC: 14.5 10*3/uL — AB (ref 3.6–11.0)

## 2016-02-21 LAB — COMPREHENSIVE METABOLIC PANEL
ALBUMIN: 4.3 g/dL (ref 3.5–5.0)
ALT: 16 U/L (ref 14–54)
ANION GAP: 11 (ref 5–15)
AST: 25 U/L (ref 15–41)
Alkaline Phosphatase: 109 U/L (ref 38–126)
BUN: 14 mg/dL (ref 6–20)
CHLORIDE: 105 mmol/L (ref 101–111)
CO2: 24 mmol/L (ref 22–32)
Calcium: 9 mg/dL (ref 8.9–10.3)
Creatinine, Ser: 0.62 mg/dL (ref 0.44–1.00)
GFR calc Af Amer: >60 mL/min (ref >60)
GFR calc non Af Amer: >60 mL/min (ref >60)
GLUCOSE: 156 mg/dL — AB (ref 65–99)
POTASSIUM: 3.4 mmol/L — AB (ref 3.5–5.1)
Sodium: 140 mmol/L (ref 135–145)
TOTAL PROTEIN: 7.5 g/dL (ref 6.5–8.1)
Total Bilirubin: 0.8 mg/dL (ref 0.3–1.2)

## 2016-02-21 LAB — BLOOD GAS, VENOUS
ACID-BASE EXCESS: 2.4 mmol/L — AB (ref 0.0–2.0)
Bicarbonate: 28.4 mmol/L — ABNORMAL HIGH (ref 20.0–28.0)
O2 SAT: 96.1 %
PATIENT TEMPERATURE: 37
PCO2 VEN: 48 mmHg (ref 44.0–60.0)
PO2 VEN: 84 mmHg — AB (ref 32.0–45.0)
pH, Ven: 7.38 (ref 7.250–7.430)

## 2016-02-21 LAB — MAGNESIUM
Magnesium: 1.8 mg/dL (ref 1.7–2.4)
Magnesium: 2.2 mg/dL (ref 1.7–2.4)

## 2016-02-21 MED ORDER — IPRATROPIUM-ALBUTEROL 0.5-2.5 (3) MG/3ML IN SOLN
RESPIRATORY_TRACT | Status: AC
  Filled 2016-02-21: qty 12

## 2016-02-21 MED ORDER — MOMETASONE FURO-FORMOTEROL FUM 200-5 MCG/ACT IN AERO
2.0000 | INHALATION_SPRAY | Freq: Two times a day (BID) | RESPIRATORY_TRACT | Status: DC
  Administered 2016-02-21 – 2016-02-22 (×2): 2 via RESPIRATORY_TRACT
  Filled 2016-02-21: qty 8.8

## 2016-02-21 MED ORDER — ALBUTEROL SULFATE (2.5 MG/3ML) 0.083% IN NEBU
2.5000 mg | INHALATION_SOLUTION | RESPIRATORY_TRACT | Status: DC | PRN
  Administered 2016-02-21 (×2): 2.5 mg via RESPIRATORY_TRACT
  Filled 2016-02-21 (×2): qty 3

## 2016-02-21 MED ORDER — THEOPHYLLINE ER 300 MG PO TB12
300.0000 mg | ORAL_TABLET | Freq: Two times a day (BID) | ORAL | Status: DC
  Administered 2016-02-21 – 2016-02-22 (×2): 300 mg via ORAL
  Filled 2016-02-21 (×2): qty 1

## 2016-02-21 MED ORDER — SODIUM CHLORIDE 0.9 % IV SOLN
INTRAVENOUS | Status: DC
  Administered 2016-02-21 (×2): via INTRAVENOUS

## 2016-02-21 MED ORDER — ATORVASTATIN CALCIUM 20 MG PO TABS
10.0000 mg | ORAL_TABLET | Freq: Every day | ORAL | Status: DC
  Administered 2016-02-21 – 2016-02-22 (×2): 10 mg via ORAL
  Filled 2016-02-21 (×2): qty 1

## 2016-02-21 MED ORDER — DILTIAZEM HCL ER COATED BEADS 180 MG PO CP24
180.0000 mg | ORAL_CAPSULE | Freq: Two times a day (BID) | ORAL | Status: DC
  Administered 2016-02-21 – 2016-02-22 (×3): 180 mg via ORAL
  Filled 2016-02-21 (×3): qty 1

## 2016-02-21 MED ORDER — ONDANSETRON HCL 4 MG/2ML IJ SOLN: 4.0000 mg | Freq: Four times a day (QID) | INTRAMUSCULAR | Status: DC | PRN

## 2016-02-21 MED ORDER — ALPRAZOLAM 0.5 MG PO TABS
0.5000 mg | ORAL_TABLET | Freq: Two times a day (BID) | ORAL | Status: DC
  Administered 2016-02-21 – 2016-02-22 (×3): 0.5 mg via ORAL
  Filled 2016-02-21 (×3): qty 1

## 2016-02-21 MED ORDER — MAGNESIUM SULFATE 2 GM/50ML IV SOLN
2.0000 g | Freq: Once | INTRAVENOUS | Status: AC
  Administered 2016-02-21: 2 g via INTRAVENOUS

## 2016-02-21 MED ORDER — HYDROCODONE-ACETAMINOPHEN 5-325 MG PO TABS: 1.0000 | ORAL_TABLET | ORAL | Status: DC | PRN

## 2016-02-21 MED ORDER — POTASSIUM CHLORIDE CRYS ER 20 MEQ PO TBCR
40.0000 meq | EXTENDED_RELEASE_TABLET | Freq: Once | ORAL | Status: AC
  Administered 2016-02-21: 18:00:00 40 meq via ORAL
  Filled 2016-02-21: qty 2

## 2016-02-21 MED ORDER — LORATADINE 10 MG PO TABS
10.0000 mg | ORAL_TABLET | Freq: Every day | ORAL | Status: DC
  Administered 2016-02-21 – 2016-02-22 (×2): 10 mg via ORAL
  Filled 2016-02-21 (×2): qty 1

## 2016-02-21 MED ORDER — ASPIRIN 81 MG PO CHEW
81.0000 mg | CHEWABLE_TABLET | Freq: Every day | ORAL | Status: DC
  Administered 2016-02-21 – 2016-02-22 (×2): 81 mg via ORAL
  Filled 2016-02-21 (×2): qty 1

## 2016-02-21 MED ORDER — MAGNESIUM SULFATE 2 GM/50ML IV SOLN
INTRAVENOUS | Status: AC
  Filled 2016-02-21: qty 50

## 2016-02-21 MED ORDER — HEPARIN SODIUM (PORCINE) 5000 UNIT/ML IJ SOLN
5000.0000 [IU] | Freq: Three times a day (TID) | INTRAMUSCULAR | Status: DC
  Filled 2016-02-21: qty 1

## 2016-02-21 MED ORDER — PANTOPRAZOLE SODIUM 40 MG PO TBEC
40.0000 mg | DELAYED_RELEASE_TABLET | Freq: Every day | ORAL | Status: DC
  Administered 2016-02-21 – 2016-02-22 (×2): 40 mg via ORAL
  Filled 2016-02-21 (×2): qty 1

## 2016-02-21 MED ORDER — ALBUTEROL SULFATE (2.5 MG/3ML) 0.083% IN NEBU
2.5000 mg | INHALATION_SOLUTION | Freq: Four times a day (QID) | RESPIRATORY_TRACT | Status: DC
Start: 2016-02-21 — End: 2016-02-22
  Administered 2016-02-21 – 2016-02-22 (×3): 2.5 mg via RESPIRATORY_TRACT
  Filled 2016-02-21 (×3): qty 3

## 2016-02-21 MED ORDER — MONTELUKAST SODIUM 10 MG PO TABS
10.0000 mg | ORAL_TABLET | Freq: Every day | ORAL | Status: DC
  Administered 2016-02-21 – 2016-02-22 (×2): 10 mg via ORAL
  Filled 2016-02-21 (×2): qty 1

## 2016-02-21 MED ORDER — METHYLPREDNISOLONE SODIUM SUCC 125 MG IJ SOLR
125.0000 mg | Freq: Once | INTRAMUSCULAR | Status: AC
  Administered 2016-02-21: 125 mg via INTRAVENOUS

## 2016-02-21 MED ORDER — METHYLPREDNISOLONE SODIUM SUCC 125 MG IJ SOLR
INTRAMUSCULAR | Status: AC
Start: 2016-02-21 — End: 2016-02-21
  Administered 2016-02-21: 125 mg via INTRAVENOUS
  Filled 2016-02-21: qty 2

## 2016-02-21 MED ORDER — POTASSIUM CHLORIDE CRYS ER 10 MEQ PO TBCR
10.0000 meq | EXTENDED_RELEASE_TABLET | Freq: Two times a day (BID) | ORAL | Status: DC
  Administered 2016-02-21 – 2016-02-22 (×2): 10 meq via ORAL
  Filled 2016-02-21 (×2): qty 1

## 2016-02-21 MED ORDER — ACETAMINOPHEN 325 MG PO TABS: 650.0000 mg | ORAL_TABLET | Freq: Four times a day (QID) | ORAL | Status: DC | PRN

## 2016-02-21 MED ORDER — BISACODYL 5 MG PO TBEC: 5.0000 mg | DELAYED_RELEASE_TABLET | Freq: Every day | ORAL | Status: DC | PRN

## 2016-02-21 MED ORDER — ONDANSETRON HCL 4 MG PO TABS: 4.0000 mg | ORAL_TABLET | Freq: Four times a day (QID) | ORAL | Status: DC | PRN

## 2016-02-21 MED ORDER — ALBUTEROL SULFATE (2.5 MG/3ML) 0.083% IN NEBU
10.0000 mg/h | INHALATION_SOLUTION | Freq: Once | RESPIRATORY_TRACT | Status: AC
  Administered 2016-02-21: 10 mg/h via RESPIRATORY_TRACT

## 2016-02-21 MED ORDER — TRAZODONE HCL 50 MG PO TABS: 25.0000 mg | ORAL_TABLET | Freq: Every evening | ORAL | Status: DC | PRN

## 2016-02-21 MED ORDER — ENOXAPARIN SODIUM 40 MG/0.4ML ~~LOC~~ SOLN
40.0000 mg | SUBCUTANEOUS | Status: DC
  Administered 2016-02-21: 40 mg via SUBCUTANEOUS
  Filled 2016-02-21: qty 0.4

## 2016-02-21 MED ORDER — SUCRALFATE 1 G PO TABS
1.0000 g | ORAL_TABLET | Freq: Four times a day (QID) | ORAL | Status: DC
  Administered 2016-02-21 – 2016-02-22 (×4): 1 g via ORAL
  Filled 2016-02-21 (×5): qty 1

## 2016-02-21 MED ORDER — IPRATROPIUM-ALBUTEROL 0.5-2.5 (3) MG/3ML IN SOLN
3.0000 mL | Freq: Once | RESPIRATORY_TRACT | Status: AC
  Administered 2016-02-21: 3 mL via RESPIRATORY_TRACT
  Filled 2016-02-21: qty 3

## 2016-02-21 MED ORDER — ACETAMINOPHEN 650 MG RE SUPP: 650.0000 mg | Freq: Four times a day (QID) | RECTAL | Status: DC | PRN

## 2016-02-21 MED ORDER — DOCUSATE SODIUM 100 MG PO CAPS
100.0000 mg | ORAL_CAPSULE | Freq: Two times a day (BID) | ORAL | Status: DC
  Administered 2016-02-21: 100 mg via ORAL
  Filled 2016-02-21 (×3): qty 1

## 2016-02-21 MED ORDER — FUROSEMIDE 40 MG PO TABS
80.0000 mg | ORAL_TABLET | Freq: Two times a day (BID) | ORAL | Status: DC
  Administered 2016-02-21 – 2016-02-22 (×2): 80 mg via ORAL
  Filled 2016-02-21 (×2): qty 2

## 2016-02-21 MED ORDER — ALBUTEROL SULFATE HFA 108 (90 BASE) MCG/ACT IN AERS: 1.0000 | INHALATION_SPRAY | RESPIRATORY_TRACT | Status: DC | PRN

## 2016-02-21 MED ORDER — METHYLPREDNISOLONE SODIUM SUCC 125 MG IJ SOLR
60.0000 mg | INTRAMUSCULAR | Status: DC
  Administered 2016-02-21 – 2016-02-22 (×2): 60 mg via INTRAVENOUS
  Filled 2016-02-21: qty 2

## 2016-02-21 NOTE — ED Provider Notes (Signed)
Time Seen: Approximately *0705  I have reviewed the triage notes  Chief Complaint: Shortness of Breath   History of Present Illness: Cassie Ross is a 55 y.o. female *who presents with acute exacerbation of chronic asthma. Patient's had a long history of asthma and states that she's had 2 previous intubations in the past. The patient states she had exfoliation of her shortness of breath this morning at 4 AM and gave herself one DuoNeb prior to arrival. She is currently on steroid therapy at 60 mg a day of prednisone. He said any fever or chest pain to simply describes it as a chest tightness.   Past Medical History:  Diagnosis Date  . Asthma   . Hypertension     There are no active problems to display for this patient.   No past surgical history on file.  No past surgical history on file.    Allergies:  Penicillins; Shellfish allergy; Sunflower oil; and Levaquin [levofloxacin in d5w]  Family History: No family history on file.  Social History: Social History  Substance Use Topics  . Smoking status: Not on file  . Smokeless tobacco: Not on file  . Alcohol use Not on file     Review of Systems:   10 point review of systems was performed and was otherwise negative:  Constitutional: No fever Eyes: No visual disturbances ENT: No sore throat, ear pain Cardiac: No chest pain Respiratory: Increasing shortness of breath and her husband adds that she's been battling some upper respiratory symptoms now for the last couple weeks. Abdomen: No abdominal pain, no vomiting, No diarrhea Endocrine: No weight loss, No night sweats Extremities: No peripheral edema, cyanosis Skin: No rashes, easy bruising Neurologic: No focal weakness, trouble with speech or swollowing Urologic: No dysuria, Hematuria, or urinary frequency   Physical Exam:  ED Triage Vitals  Enc Vitals Group     BP      Pulse      Resp      Temp      Temp src      SpO2      Weight      Height       Head Circumference      Peak Flow      Pain Score      Pain Loc      Pain Edu?      Excl. in [place]?     General: Awake , Alert , and Oriented times 3; GCS 15 Patient speaks in interrupted sentences and is currently on a 6 L nasal cannula. Some mild upper respiratory retractions. Head: Normal cephalic , atraumatic Eyes: Pupils equal , round, reactive to light Nose/Throat: No nasal drainage, patent upper airway without erythema or exudate.  Neck: Supple, Full range of motion, No anterior adenopathy or palpable thyroid masses Lungs: Limited air movement at the bases with end expiratory wheezing heard symmetrically at the apices and no Rales rhonchi are noted  Heart: Regular rate, regular rhythm without murmurs , gallops , or rubs Abdomen: Soft, non tender without rebound, guarding , or rigidity; bowel sounds positive and symmetric in all 4 quadrants. No organomegaly .        Extremities: 2 plus symmetric pulses. No edema, clubbing or cyanosis Neurologic: normal ambulation, Motor symmetric without deficits, sensory intact Skin: warm, dry, no rashes   Labs:   All laboratory work was reviewed including any pertinent negatives or positives listed below:  Labs Reviewed  CBC WITH DIFFERENTIAL/PLATELET - Abnormal;  Notable for the following:       Result Value   WBC 14.5 (*)    Neutro Abs 9.6 (*)    Monocytes Absolute 1.5 (*)    All other components within normal limits  BLOOD GAS, VENOUS - Abnormal; Notable for the following:    pO2, Ven 84.0 (*)    Bicarbonate 28.4 (*)    Acid-Base Excess 2.4 (*)    All other components within normal limits  COMPREHENSIVE METABOLIC PANEL  MAGNESIUM    Radiology: "Dg Chest Port 1 View  Result Date: 02/21/2016 CLINICAL DATA:  Shortness of Breath EXAM: PORTABLE CHEST 1 VIEW COMPARISON:  June 26, 2014 FINDINGS: There is mild atelectasis in the left base. Lungs elsewhere are clear. Heart size and pulmonary vascularity are normal. No adenopathy. No bone  lesions. IMPRESSION: Mild left base atelectasis. Lungs elsewhere clear. Stable cardiac silhouette. Electronically Signed   By: Lowella Grip III M.D.   On: 02/21/2016 08:10  "  I personally reviewed the radiologic studies   Critical Care: * CRITICAL CARE Performed by: Daymon Larsen   Total critical care time: 33 minutes  Critical care time was exclusive of separately billable procedures and treating other patients.  Critical care was necessary to treat or prevent imminent or life-threatening deterioration.  Critical care was time spent personally by me on the following activities: development of treatment plan with patient and/or surrogate as well as nursing, discussions with consultants, evaluation of patient's response to treatment, examination of patient, obtaining history from patient or surrogate, ordering and performing treatments and interventions, ordering and review of laboratory studies, ordering and review of radiographic studies, pulse oximetry and re-evaluation of patient's condition. Initial evaluation and treatment for status asthmaticus    ED Course:  Patient was placed on continuous cardiac monitoring and continuous pulse oximetry. She was initiated on a 1 hour continuous breathing treatment of albuterol and had 4 previous nebulizations prior to arrival at home. Patient's on high-dose steroids at 60 mg a day and was given a bolus of 125 mg of Solu-Medrol here in emergency department. She was started on a magnesium bolus at 2 g IV. Significant history of 2 previous intubations from status asthmaticus. She did show clinical improvement and repeat exam still shows some persistent wheezing though definitely moving more air. She's down to a pulse ox of 98% on a 2 L nasal cannula. Clinical Course      Assessment:  Status asthmaticus    Plan:  Inpatient management            Daymon Larsen, MD 02/21/16 0930

## 2016-02-21 NOTE — ED Notes (Signed)
Patient finished nebulizer treatment. Able to speak in short sentences. SPO2 99% 2L [place].

## 2016-02-21 NOTE — ED Triage Notes (Addendum)
Patient comes in for shortness of breath. Has done 4 duoneb breathing treatments at home and is currently on a prednisone 60mg . Hx of asthma. Audible wheezing. Unable to speak in complete sentences. EDP at bedside.

## 2016-02-21 NOTE — H&P (Addendum)
[facility] NAME: Cassie Ross    MR#:  [id number]  DATE OF BIRTH:  [birth year]  DATE OF ADMISSION:  02/21/2016  PRIMARY CARE PHYSICIAN: Lavera Guise, MD   REQUESTING/REFERRING PHYSICIAN: Daymon Larsen, MD  CHIEF COMPLAINT:   Chief Complaint  Patient presents with  . Shortness of Breath   HISTORY OF PRESENT ILLNESS:  Cassie Ross  is a 55 y.o. female with a known history of Asthma, HTN being admitted for acute exacerbation of chronic asthma. Patient's had a long history of asthma and states that she's had 2 previous intubations in the past. The patient states she had exfoliation of her shortness of breath this morning at 4 AM and gave herself one DuoNeb prior to arrival. She was taking steroid therapy at 60 mg a day of prednisone for last 2 days at home along with nebs without much relief.  PAST MEDICAL HISTORY:   Past Medical History:  Diagnosis Date  . Asthma   . CHF (congestive heart failure) ([place])    1991- unknown after asthma attack  . Hypertension   . Chickenpox  . Diabetes mellitus type 2, uncomplicated  . DVT (deep venous thrombosis)  . GERD (gastroesophageal reflux disease)  . Hernia, hiatal  . Hyperlipidemia  . Seasonal allergies  PAST SURGICAL HISTORY:   Past Surgical History:  Procedure Laterality Date  . HERNIA REPAIR      SOCIAL HISTORY:   Social History  Substance Use Topics  . Smoking status: Never Smoker  . Smokeless tobacco: Never Used  . Alcohol use No   FAMILY HISTORY:  No family history on file. . Colon cancer Father  . Lung cancer Mother  DRUG ALLERGIES:   Allergies  Allergen Reactions  . Penicillins     Has patient had a PCN reaction causing immediate rash, facial/tongue/throat swelling, SOB or lightheadedness with hypotension: no Has patient had a PCN reaction causing severe rash involving mucus membranes or skin necrosis: yes Has patient had a PCN reaction that required  hospitalization no Has patient had a PCN reaction occurring within the last 10 years: no If all of the above answers are "NO", then may proceed with Cephalosporin use.   . Shellfish Allergy Swelling  . Sunflower Oil Swelling    seeds  . Levaquin [Levofloxacin In D5w] Rash    Pill form   REVIEW OF SYSTEMS:   Review of Systems  Constitutional: Negative for chills, fever and weight loss.  HENT: Negative for nosebleeds and sore throat.   Eyes: Negative for blurred vision.  Respiratory: Positive for shortness of breath. Negative for cough and wheezing.   Cardiovascular: Negative for chest pain, orthopnea, leg swelling and PND.  Gastrointestinal: Negative for abdominal pain, constipation, diarrhea, heartburn, nausea and vomiting.  Genitourinary: Negative for dysuria and urgency.  Musculoskeletal: Negative for back pain.  Skin: Negative for rash.  Neurological: Negative for dizziness, speech change, focal weakness and headaches.  Endo/Heme/Allergies: Does not bruise/bleed easily.  Psychiatric/Behavioral: Negative for depression.    MEDICATIONS AT HOME:   Prior to Admission medications   Medication Sig Start Date End Date Taking? Authorizing Provider  ASPIRIN 81 PO Take 1 tablet by mouth daily.   Yes Historical Provider, MD  pantoprazole (PROTONIX) 40 MG tablet Take 40 mg by mouth daily.   Yes Historical Provider, MD  sucralfate (CARAFATE) 1 g tablet Take 1 g by mouth 4 (four) times daily.   Yes Historical Provider, MD  VITAL SIGNS:  Blood pressure (!) 166/90, pulse (!) 112, resp. rate (!) 27, height 5\' 3"  (1.6 m), weight 89.8 kg (198 lb), SpO2 (!) 88 %. PHYSICAL EXAMINATION:  Physical Exam  GENERAL:  55 y.o.-year-old patient lying in the bed with no acute distress.  EYES: Pupils equal, round, reactive to light and accommodation. No scleral icterus. Extraocular muscles intact.  HEENT: Head atraumatic, normocephalic. Oropharynx and nasopharynx clear.  NECK:  Supple, no jugular  venous distention. No thyroid enlargement, no tenderness.  LUNGS: Normal breath sounds bilaterally, extensive b/l exp wheezing, rales,rhonchi or crepitation. No use of accessory muscles of respiration.  CARDIOVASCULAR: S1, S2 normal. No murmurs, rubs, or gallops.  ABDOMEN: Soft, nontender, nondistended. Bowel sounds present. No organomegaly or mass.  EXTREMITIES: No pedal edema, cyanosis, or clubbing.  NEUROLOGIC: Cranial nerves II through XII are intact. Muscle strength 5/5 in all extremities. Sensation intact. Gait not checked.  PSYCHIATRIC: The patient is alert and oriented x 3.  SKIN: No obvious rash, lesion, or ulcer.   LABORATORY PANEL:   CBC  Recent Labs Lab 02/21/16 0707  WBC 14.5*  HGB 14.6  HCT 42.1  PLT 314   ------------------------------------------------------------------------------------------------------------------  Chemistries   Recent Labs Lab 02/21/16 0707  NA 140  K 3.4*  CL 105  CO2 24  GLUCOSE 156*  BUN 14  CREATININE 0.62  CALCIUM 9.0  MG 1.8  AST 25  ALT 16  ALKPHOS 109  BILITOT 0.8   ------------------------------------------------------------------------------------------------------------------  Cardiac Enzymes No results for input(s): TROPONINI in the last 168 hours. ------------------------------------------------------------------------------------------------------------------  RADIOLOGY:  Dg Chest Port 1 View  Result Date: 02/21/2016 CLINICAL DATA:  Shortness of Breath EXAM: PORTABLE CHEST 1 VIEW COMPARISON:  June 26, 2014 FINDINGS: There is mild atelectasis in the left base. Lungs elsewhere are clear. Heart size and pulmonary vascularity are normal. No adenopathy. No bone lesions. IMPRESSION: Mild left base atelectasis. Lungs elsewhere clear. Stable cardiac silhouette. Electronically Signed   By: Lowella Grip III M.D.   On: 02/21/2016 08:10   IMPRESSION AND PLAN:  55 y.o. female with a known history of Asthma, HTN being  admitted for acute exacerbation of chronic asthma  * Asthma exacerbation: - IV steroids, Albuterol nebs  - continue home inhalers - O2 via [place] and monitor  * Hypokalemia - replete and recheck - check Mg  * HTN/Tachycardia - monitor on home meds  * GERD - continue protonix and sucralfate    All the records are reviewed and case discussed with ED provider. Management plans discussed with the patient, family and they are in agreement.  CODE STATUS: FULL CODE  TOTAL TIME TAKING CARE OF THIS PATIENT: 45 minutes.    Max Sane M.D on 02/21/2016 at 9:13 AM  Between 7am to 6pm - Pager - [phone]  After 6pm go to [email or web address] - Proofreader  [facility]  Office  [phone]  CC: Primary care physician; Lavera Guise, MD   Note: This dictation was prepared with Dragon dictation along with smaller phrase technology. Any transcriptional errors that result from this process are unintentional.

## 2016-02-22 DIAGNOSIS — J45901 Unspecified asthma with (acute) exacerbation: Secondary | ICD-10-CM | POA: Diagnosis not present

## 2016-02-22 DIAGNOSIS — I10 Essential (primary) hypertension: Secondary | ICD-10-CM | POA: Diagnosis not present

## 2016-02-22 DIAGNOSIS — E876 Hypokalemia: Secondary | ICD-10-CM | POA: Diagnosis not present

## 2016-02-22 DIAGNOSIS — K219 Gastro-esophageal reflux disease without esophagitis: Secondary | ICD-10-CM | POA: Diagnosis not present

## 2016-02-22 LAB — BASIC METABOLIC PANEL
Anion gap: 5 (ref 5–15)
BUN: 17 mg/dL (ref 6–20)
CALCIUM: 8.5 mg/dL — AB (ref 8.9–10.3)
CO2: 29 mmol/L (ref 22–32)
CREATININE: 0.59 mg/dL (ref 0.44–1.00)
Chloride: 107 mmol/L (ref 101–111)
GFR calc Af Amer: >60 mL/min (ref >60)
GLUCOSE: 217 mg/dL — AB (ref 65–99)
Potassium: 4 mmol/L (ref 3.5–5.1)
Sodium: 141 mmol/L (ref 135–145)

## 2016-02-22 LAB — CBC
HCT: 35.8 % (ref 35.0–47.0)
Hemoglobin: 12.2 g/dL (ref 12.0–16.0)
MCH: 30.7 pg (ref 26.0–34.0)
MCHC: 33.9 g/dL (ref 32.0–36.0)
MCV: 90.5 fL (ref 80.0–100.0)
PLATELETS: 255 10*3/uL (ref 150–440)
RBC: 3.96 MIL/uL (ref 3.80–5.20)
RDW: 13.2 % (ref 11.5–14.5)
WBC: 11.1 10*3/uL — ABNORMAL HIGH (ref 3.6–11.0)

## 2016-02-22 LAB — GLUCOSE, CAPILLARY: GLUCOSE-CAPILLARY: 183 mg/dL — AB (ref 65–99)

## 2016-02-22 MED ORDER — PREDNISONE 10 MG (21) PO TBPK: 10.0000 mg | ORAL_TABLET | Freq: Every day | ORAL | 0 refills | Status: DC

## 2016-02-22 NOTE — Progress Notes (Signed)
Patient discharged home per MD order. All discharge instructions given and all questions answered. 

## 2016-02-22 NOTE — Progress Notes (Signed)
Dr Manuella Ghazi notified of Blood sugar 183. MD acknowledged, no new orders.

## 2016-02-22 NOTE — Discharge Instructions (Signed)
Asthma, Acute Bronchospasm °Acute bronchospasm caused by asthma is also referred to as an asthma attack. Bronchospasm means your air passages become narrowed. The narrowing is caused by inflammation and tightening of the muscles in the air tubes (bronchi) in your lungs. This can make it hard to breathe or cause you to wheeze and cough. °What are the causes? °Possible triggers are: °· Animal dander from the skin, hair, or feathers of animals. °· Dust mites contained in house dust. °· Cockroaches. °· Pollen from trees or grass. °· Mold. °· Cigarette or tobacco smoke. °· Air pollutants such as dust, household cleaners, hair sprays, aerosol sprays, paint fumes, strong chemicals, or strong odors. °· Cold air or weather changes. Cold air may trigger inflammation. Winds increase molds and pollens in the air. °· Strong emotions such as crying or laughing hard. °· Stress. °· Certain medicines such as aspirin or beta-blockers. °· Sulfites in foods and drinks, such as dried fruits and wine. °· Infections or inflammatory conditions, such as a flu, cold, or inflammation of the nasal membranes (rhinitis). °· Gastroesophageal reflux disease (GERD). GERD is a condition where stomach acid backs up into your esophagus. °· Exercise or strenuous activity. ° °What are the signs or symptoms? °· Wheezing. °· Excessive coughing, particularly at night. °· Chest tightness. °· Shortness of breath. °How is this diagnosed? °Your health care provider will ask you about your medical history and perform a physical exam. A chest X-ray or blood testing may be performed to look for other causes of your symptoms or other conditions that may have triggered your asthma attack. °How is this treated? °Treatment is aimed at reducing inflammation and opening up the airways in your lungs. Most asthma attacks are treated with inhaled medicines. These include quick relief or rescue medicines (such as bronchodilators) and controller medicines (such as inhaled  corticosteroids). These medicines are sometimes given through an inhaler or a nebulizer. Systemic steroid medicine taken by mouth or given through an IV tube also can be used to reduce the inflammation when an attack is moderate or severe. Antibiotic medicines are only used if a bacterial infection is present. °Follow these instructions at home: °· Rest. °· Drink plenty of liquids. This helps the mucus to remain thin and be easily coughed up. Only use caffeine in moderation and do not use alcohol until you have recovered from your illness. °· Do not smoke. Avoid being exposed to secondhand smoke. °· You play a critical role in keeping yourself in good health. Avoid exposure to things that cause you to wheeze or to have breathing problems. °· Keep your medicines up-to-date and available. Carefully follow your health care provider’s treatment plan. °· Take your medicine exactly as prescribed. °· When pollen or pollution is bad, keep windows closed and use an air conditioner or go to places with air conditioning. °· Asthma requires careful medical care. See your health care provider for a follow-up as advised. If you are more than [redacted] weeks pregnant and you were prescribed any new medicines, let your obstetrician know about the visit and how you are doing. Follow up with your health care provider as directed. °· After you have recovered from your asthma attack, make an appointment with your outpatient doctor to talk about ways to reduce the likelihood of future attacks. If you do not have a doctor who manages your asthma, make an appointment with a primary care doctor to discuss your asthma. °Get help right away if: °· You are getting worse. °·   You have trouble breathing. If severe, call your local emergency services (911 in the [place] °· You develop chest pain or discomfort. °· You are vomiting. °· You are not able to keep fluids down. °· You are coughing up yellow, green, brown, or bloody sputum. °· You have a fever  and your symptoms suddenly get worse. °· You have trouble swallowing. °This information is not intended to replace advice given to you by your health care provider. Make sure you discuss any questions you have with your health care provider. °Document Released: 06/12/2006 Document Revised: 08/09/2015 Document Reviewed: 09/02/2012 °Elsevier Interactive Patient Education © 2017 [employer] ° °

## 2016-02-22 NOTE — Care Management Obs Status (Signed)
[facility] NOTIFICATION   Patient Details  Name: Cassie Ross MRN: [id number] Date of Birth: [birth year]   Medicare Observation Status Notification Given:  Yes    Shelbie Ammons, RN 02/22/2016, 9:05 AM

## 2016-02-22 NOTE — Care Management (Signed)
Admitted to [facility] under observation status with the diagnosis of Asthma. Lives with husband, Delfino Lovett, ( [phone]), Next appointment with Dr. Darcel Bayley 02/27/16. Home Health 2 years ago per [facility]. No skilled facility. No home oxygen. Uses no aids for ambulation. Takes care of all basic activities of daily living herself, drives. Slid to knees about 2 months ago. Good appetite. Prescriptions are filled per mail order and [facility] on [address]. Does have nebulizer in the home. Husband will transport. Shelbie Ammons RN MSN CCM Care Management

## 2016-02-24 NOTE — Discharge Summary (Signed)
[facility] NAME: Cassie Ross    MR#:  [id number]  DATE OF BIRTH:  [birth year]  DATE OF ADMISSION:  02/21/2016   ADMITTING PHYSICIAN: Max Sane, MD  DATE OF DISCHARGE: 02/22/2016 11:06 AM  PRIMARY CARE PHYSICIAN: Lavera Guise, MD   ADMISSION DIAGNOSIS:  Severe asthma with exacerbation, unspecified whether persistent [J45.901] DISCHARGE DIAGNOSIS:  Active Problems:   Asthma exacerbation  SECONDARY DIAGNOSIS:   Past Medical History:  Diagnosis Date  . Asthma   . CHF (congestive heart failure) ([place])    1991- unknown after asthma attack  . Hypertension    HOSPITAL COURSE:  55 y.o. female with a known history of Asthma, HTN being admitted for acute exacerbation of chronic asthma  * Asthma exacerbation: - improved with IV steroids, Albuterol nebs  - D/C on oral steroids taper  * Hypokalemia - repleted and resolved  * HTN/Tachycardia - controlled  * GERD - continue protonix and sucralfate  DISCHARGE CONDITIONS:  STABLE CONSULTS OBTAINED:   DRUG ALLERGIES:   Allergies  Allergen Reactions  . Penicillins     Has patient had a PCN reaction causing immediate rash, facial/tongue/throat swelling, SOB or lightheadedness with hypotension: no Has patient had a PCN reaction causing severe rash involving mucus membranes or skin necrosis: yes Has patient had a PCN reaction that required hospitalization no Has patient had a PCN reaction occurring within the last 10 years: no If all of the above answers are "NO", then may proceed with Cephalosporin use.   . Shellfish Allergy Swelling  . Sunflower Oil Swelling    seeds  . Levaquin [Levofloxacin In D5w] Rash    Pill form   DISCHARGE MEDICATIONS:   Allergies as of 02/22/2016      Reactions   Penicillins    Has patient had a PCN reaction causing immediate rash, facial/tongue/throat swelling, SOB or lightheadedness with hypotension: no Has patient had a PCN  reaction causing severe rash involving mucus membranes or skin necrosis: yes Has patient had a PCN reaction that required hospitalization no Has patient had a PCN reaction occurring within the last 10 years: no If all of the above answers are "NO", then may proceed with Cephalosporin use.   Shellfish Allergy Swelling   Sunflower Oil Swelling   seeds   Levaquin [levofloxacin In D5w] Rash   Pill form      Medication List    STOP taking these medications   predniSONE 10 MG tablet Commonly known as:  DELTASONE Replaced by:  predniSONE 10 MG (21) Tbpk tablet     TAKE these medications   albuterol 108 (90 Base) MCG/ACT inhaler Commonly known as:  PROVENTIL HFA;VENTOLIN HFA Inhale 1 puff into the lungs as needed for wheezing or shortness of breath.   ALPRAZolam 0.5 MG tablet Commonly known as:  XANAX Take 0.5-1 mg by mouth 2 (two) times daily.   ASPIRIN 81 PO Take 1 tablet by mouth daily.   atorvastatin 10 MG tablet Commonly known as:  LIPITOR Take 10 mg by mouth daily.   budesonide-formoterol 160-4.5 MCG/ACT inhaler Commonly known as:  SYMBICORT Inhale 1 puff into the lungs 2 (two) times daily.   CALCIUM + D PO Take 2 tablets by mouth 2 (two) times daily.   diltiazem 180 MG 24 hr capsule Commonly known as:  CARDIZEM CD Take 180 mg by mouth 2 (two) times daily.   furosemide 80 MG tablet Commonly known as:  LASIX Take 80  mg by mouth 2 (two) times daily.   loratadine 10 MG tablet Commonly known as:  CLARITIN Take 10 mg by mouth daily.   montelukast 10 MG tablet Commonly known as:  SINGULAIR Take 10 mg by mouth daily.   ONE-A-DAY MENOPAUSE FORMULA PO Take 1 tablet by mouth daily.   pantoprazole 40 MG tablet Commonly known as:  PROTONIX Take 40 mg by mouth daily.   potassium chloride 10 MEQ tablet Commonly known as:  K-DUR,KLOR-CON Take 10 mEq by mouth 2 (two) times daily.   predniSONE 10 MG (21) Tbpk tablet Commonly known as:  STERAPRED UNI-PAK 21 TAB Take  1 tablet (10 mg total) by mouth daily. 60 mg once daily, taper 10 mg daily until done Replaces:  predniSONE 10 MG tablet   sucralfate 1 g tablet Commonly known as:  CARAFATE Take 1 g by mouth 4 (four) times daily.   theophylline 300 MG 12 hr tablet Commonly known as:  THEODUR Take 300 mg by mouth 2 (two) times daily.        DISCHARGE INSTRUCTIONS:   DIET:  Regular diet DISCHARGE CONDITION:  Good ACTIVITY:  Activity as tolerated OXYGEN:  Home Oxygen: No.  Oxygen Delivery: room air DISCHARGE LOCATION:  home   If you experience worsening of your admission symptoms, develop shortness of breath, life threatening emergency, suicidal or homicidal thoughts you must seek medical attention immediately by calling 911 or calling your MD immediately  if symptoms less severe.  You Must read complete instructions/literature along with all the possible adverse reactions/side effects for all the Medicines you take and that have been prescribed to you. Take any new Medicines after you have completely understood and accpet all the possible adverse reactions/side effects.   Please note  You were cared for by a hospitalist during your hospital stay. If you have any questions about your discharge medications or the care you received while you were in the hospital after you are discharged, you can call the unit and asked to speak with the hospitalist on call if the hospitalist that took care of you is not available. Once you are discharged, your primary care physician will handle any further medical issues. Please note that NO REFILLS for any discharge medications will be authorized once you are discharged, as it is imperative that you return to your primary care physician (or establish a relationship with a primary care physician if you do not have one) for your aftercare needs so that they can reassess your need for medications and monitor your lab values.    On the day of Discharge:  VITAL SIGNS:   Blood pressure (!) 141/72, pulse 100, temperature 98 F (36.7 C), temperature source Oral, resp. rate 16, height 5\' 3"  (1.6 m), weight 92.5 kg (204 lb), SpO2 95 %. PHYSICAL EXAMINATION:  GENERAL:  55 y.o.-year-old patient lying in the bed with no acute distress.  EYES: Pupils equal, round, reactive to light and accommodation. No scleral icterus. Extraocular muscles intact.  HEENT: Head atraumatic, normocephalic. Oropharynx and nasopharynx clear.  NECK:  Supple, no jugular venous distention. No thyroid enlargement, no tenderness.  LUNGS: Normal breath sounds bilaterally, no wheezing, rales,rhonchi or crepitation. No use of accessory muscles of respiration.  CARDIOVASCULAR: S1, S2 normal. No murmurs, rubs, or gallops.  ABDOMEN: Soft, non-tender, non-distended. Bowel sounds present. No organomegaly or mass.  EXTREMITIES: No pedal edema, cyanosis, or clubbing.  NEUROLOGIC: Cranial nerves II through XII are intact. Muscle strength 5/5 in all extremities. Sensation intact. Gait  not checked.  PSYCHIATRIC: The patient is alert and oriented x 3.  SKIN: No obvious rash, lesion, or ulcer.  DATA REVIEW:   CBC  Recent Labs Lab 02/22/16 0621  WBC 11.1*  HGB 12.2  HCT 35.8  PLT 255    Chemistries   Recent Labs Lab 02/21/16 0707 02/21/16 1227 02/22/16 0621  NA 140  --  141  K 3.4*  --  4.0  CL 105  --  107  CO2 24  --  29  GLUCOSE 156*  --  217*  BUN 14  --  17  CREATININE 0.62  --  0.59  CALCIUM 9.0  --  8.5*  MG 1.8 2.2  --   AST 25  --   --   ALT 16  --   --   ALKPHOS 109  --   --   BILITOT 0.8  --   --      Follow-up Information    Lavera Guise, MD. Go on 03/12/2016.   Specialty:  Internal Medicine Why:  @1 :30 PM Contact information: [place] [address] [phone]           Management plans discussed with the patient, family and they are in agreement.  CODE STATUS: FULL CODE  TOTAL TIME TAKING CARE OF THIS PATIENT: 45 minutes.    Max Sane M.D on 02/24/2016 at 6:01 PM  Between 7am to 6pm - Pager - [phone]  After 6pm go to [email or web address] - Proofreader  [facility]  Office  [phone]  CC: Primary care physician; Lavera Guise, MD   Note: This dictation was prepared with Dragon dictation along with smaller phrase technology. Any transcriptional errors that result from this process are unintentional.

## 2016-02-26 ENCOUNTER — Inpatient Hospital Stay
Admission: EM | Admit: 2016-02-26 | Discharge: 2016-02-28 | DRG: 190 | Disposition: A | Discharge status: Home / Self Care | Payer: Commercial Managed Care - HMO | Attending: Internal Medicine | Admitting: Internal Medicine

## 2016-02-26 ENCOUNTER — Encounter: Payer: Self-pay | Admitting: Emergency Medicine

## 2016-02-26 ENCOUNTER — Emergency Department: Payer: Commercial Managed Care - HMO

## 2016-02-26 DIAGNOSIS — E876 Hypokalemia: Secondary | ICD-10-CM | POA: Diagnosis not present

## 2016-02-26 DIAGNOSIS — Z881 Allergy status to other antibiotic agents status: Secondary | ICD-10-CM | POA: Diagnosis not present

## 2016-02-26 DIAGNOSIS — Z7982 Long term (current) use of aspirin: Secondary | ICD-10-CM

## 2016-02-26 DIAGNOSIS — J189 Pneumonia, unspecified organism: Secondary | ICD-10-CM | POA: Diagnosis not present

## 2016-02-26 DIAGNOSIS — E785 Hyperlipidemia, unspecified: Secondary | ICD-10-CM | POA: Diagnosis not present

## 2016-02-26 DIAGNOSIS — Z91013 Allergy to seafood: Secondary | ICD-10-CM

## 2016-02-26 DIAGNOSIS — I5030 Unspecified diastolic (congestive) heart failure: Secondary | ICD-10-CM | POA: Diagnosis not present

## 2016-02-26 DIAGNOSIS — J4540 Moderate persistent asthma, uncomplicated: Secondary | ICD-10-CM | POA: Diagnosis not present

## 2016-02-26 DIAGNOSIS — K219 Gastro-esophageal reflux disease without esophagitis: Secondary | ICD-10-CM | POA: Diagnosis not present

## 2016-02-26 DIAGNOSIS — Z91018 Allergy to other foods: Secondary | ICD-10-CM | POA: Diagnosis not present

## 2016-02-26 DIAGNOSIS — Z7951 Long term (current) use of inhaled steroids: Secondary | ICD-10-CM | POA: Diagnosis not present

## 2016-02-26 DIAGNOSIS — Z880 Allergy status to penicillin: Secondary | ICD-10-CM | POA: Diagnosis not present

## 2016-02-26 DIAGNOSIS — J9601 Acute respiratory failure with hypoxia: Secondary | ICD-10-CM | POA: Diagnosis not present

## 2016-02-26 DIAGNOSIS — J45901 Unspecified asthma with (acute) exacerbation: Secondary | ICD-10-CM | POA: Diagnosis not present

## 2016-02-26 DIAGNOSIS — J441 Chronic obstructive pulmonary disease with (acute) exacerbation: Secondary | ICD-10-CM | POA: Diagnosis not present

## 2016-02-26 DIAGNOSIS — I110 Hypertensive heart disease with heart failure: Secondary | ICD-10-CM | POA: Diagnosis not present

## 2016-02-26 DIAGNOSIS — J9620 Acute and chronic respiratory failure, unspecified whether with hypoxia or hypercapnia: Secondary | ICD-10-CM | POA: Diagnosis not present

## 2016-02-26 DIAGNOSIS — I10 Essential (primary) hypertension: Secondary | ICD-10-CM | POA: Diagnosis not present

## 2016-02-26 DIAGNOSIS — Z79899 Other long term (current) drug therapy: Secondary | ICD-10-CM | POA: Diagnosis not present

## 2016-02-26 DIAGNOSIS — J45909 Unspecified asthma, uncomplicated: Secondary | ICD-10-CM | POA: Diagnosis not present

## 2016-02-26 DIAGNOSIS — R0600 Dyspnea, unspecified: Secondary | ICD-10-CM | POA: Diagnosis not present

## 2016-02-26 DIAGNOSIS — J45902 Unspecified asthma with status asthmaticus: Secondary | ICD-10-CM | POA: Diagnosis not present

## 2016-02-26 DIAGNOSIS — J209 Acute bronchitis, unspecified: Secondary | ICD-10-CM | POA: Diagnosis not present

## 2016-02-26 HISTORY — DX: Chronic obstructive pulmonary disease, unspecified: J44.9

## 2016-02-26 LAB — BASIC METABOLIC PANEL
Anion gap: 12 (ref 5–15)
BUN: 15 mg/dL (ref 6–20)
CHLORIDE: 97 mmol/L — AB (ref 101–111)
CO2: 31 mmol/L (ref 22–32)
Calcium: 9.2 mg/dL (ref 8.9–10.3)
Creatinine, Ser: 0.65 mg/dL (ref 0.44–1.00)
GFR calc Af Amer: >60 mL/min (ref >60)
GFR calc non Af Amer: >60 mL/min (ref >60)
Glucose, Bld: 166 mg/dL — ABNORMAL HIGH (ref 65–99)
POTASSIUM: 2.6 mmol/L — AB (ref 3.5–5.1)
SODIUM: 140 mmol/L (ref 135–145)

## 2016-02-26 LAB — CBC
HCT: 44.3 % (ref 35.0–47.0)
HEMOGLOBIN: 15.3 g/dL (ref 12.0–16.0)
MCH: 30.9 pg (ref 26.0–34.0)
MCHC: 34.4 g/dL (ref 32.0–36.0)
MCV: 89.8 fL (ref 80.0–100.0)
Platelets: 368 10*3/uL (ref 150–440)
RBC: 4.94 MIL/uL (ref 3.80–5.20)
RDW: 13.4 % (ref 11.5–14.5)
WBC: 19.1 10*3/uL — ABNORMAL HIGH (ref 3.6–11.0)

## 2016-02-26 LAB — GLUCOSE, CAPILLARY
GLUCOSE-CAPILLARY: 202 mg/dL — AB (ref 65–99)
Glucose-Capillary: 164 mg/dL — ABNORMAL HIGH (ref 65–99)

## 2016-02-26 LAB — TROPONIN I

## 2016-02-26 MED ORDER — LORATADINE 10 MG PO TABS
10.0000 mg | ORAL_TABLET | Freq: Every day | ORAL | Status: DC
  Administered 2016-02-27 – 2016-02-28 (×2): 10 mg via ORAL
  Filled 2016-02-26 (×2): qty 1

## 2016-02-26 MED ORDER — CEFTRIAXONE SODIUM-DEXTROSE 1-3.74 GM-% IV SOLR
1.0000 g | Freq: Once | INTRAVENOUS | Status: AC
  Administered 2016-02-26: 1 g via INTRAVENOUS

## 2016-02-26 MED ORDER — ALPRAZOLAM 0.5 MG PO TABS
0.5000 mg | ORAL_TABLET | Freq: Two times a day (BID) | ORAL | Status: DC
  Administered 2016-02-26 – 2016-02-28 (×4): 0.5 mg via ORAL
  Filled 2016-02-26 (×4): qty 1

## 2016-02-26 MED ORDER — DILTIAZEM HCL ER COATED BEADS 180 MG PO CP24
180.0000 mg | ORAL_CAPSULE | Freq: Two times a day (BID) | ORAL | Status: DC
  Administered 2016-02-26 – 2016-02-28 (×4): 180 mg via ORAL
  Filled 2016-02-26 (×4): qty 1

## 2016-02-26 MED ORDER — PANTOPRAZOLE SODIUM 40 MG PO TBEC
40.0000 mg | DELAYED_RELEASE_TABLET | Freq: Every day | ORAL | Status: DC
  Administered 2016-02-27 – 2016-02-28 (×2): 40 mg via ORAL
  Filled 2016-02-26 (×2): qty 1

## 2016-02-26 MED ORDER — INSULIN ASPART 100 UNIT/ML ~~LOC~~ SOLN
0.0000 [IU] | Freq: Three times a day (TID) | SUBCUTANEOUS | Status: DC
  Administered 2016-02-26: 2 [IU] via SUBCUTANEOUS
  Administered 2016-02-27: 5 [IU] via SUBCUTANEOUS
  Filled 2016-02-26: qty 5
  Filled 2016-02-26: qty 2

## 2016-02-26 MED ORDER — MONTELUKAST SODIUM 10 MG PO TABS
10.0000 mg | ORAL_TABLET | Freq: Every day | ORAL | Status: DC
  Administered 2016-02-26 – 2016-02-28 (×3): 10 mg via ORAL
  Filled 2016-02-26 (×3): qty 1

## 2016-02-26 MED ORDER — IPRATROPIUM-ALBUTEROL 0.5-2.5 (3) MG/3ML IN SOLN
3.0000 mL | Freq: Once | RESPIRATORY_TRACT | Status: AC
  Administered 2016-02-26: 3 mL via RESPIRATORY_TRACT
  Filled 2016-02-26: qty 3

## 2016-02-26 MED ORDER — ENOXAPARIN SODIUM 40 MG/0.4ML ~~LOC~~ SOLN
40.0000 mg | SUBCUTANEOUS | Status: DC
  Administered 2016-02-26 – 2016-02-27 (×2): 40 mg via SUBCUTANEOUS
  Filled 2016-02-26 (×2): qty 0.4

## 2016-02-26 MED ORDER — INSULIN ASPART 100 UNIT/ML ~~LOC~~ SOLN
0.0000 [IU] | Freq: Every day | SUBCUTANEOUS | Status: DC
  Administered 2016-02-26 – 2016-02-27 (×2): 2 [IU] via SUBCUTANEOUS
  Filled 2016-02-26 (×2): qty 2

## 2016-02-26 MED ORDER — MAGNESIUM SULFATE 2 GM/50ML IV SOLN
2.0000 g | Freq: Once | INTRAVENOUS | Status: AC
  Administered 2016-02-26: 2 g via INTRAVENOUS
  Filled 2016-02-26: qty 50

## 2016-02-26 MED ORDER — POTASSIUM CHLORIDE CRYS ER 10 MEQ PO TBCR
10.0000 meq | EXTENDED_RELEASE_TABLET | Freq: Two times a day (BID) | ORAL | Status: DC
  Administered 2016-02-27 – 2016-02-28 (×3): 10 meq via ORAL
  Filled 2016-02-26 (×3): qty 1

## 2016-02-26 MED ORDER — ASPIRIN 81 MG PO CHEW
81.0000 mg | CHEWABLE_TABLET | Freq: Every day | ORAL | Status: DC
  Administered 2016-02-27 – 2016-02-28 (×2): 81 mg via ORAL
  Filled 2016-02-26 (×2): qty 1

## 2016-02-26 MED ORDER — IPRATROPIUM-ALBUTEROL 0.5-2.5 (3) MG/3ML IN SOLN
RESPIRATORY_TRACT | Status: AC
  Filled 2016-02-26: qty 6

## 2016-02-26 MED ORDER — METHYLPREDNISOLONE SODIUM SUCC 125 MG IJ SOLR
60.0000 mg | Freq: Four times a day (QID) | INTRAMUSCULAR | Status: DC
Start: 2016-02-26 — End: 2016-02-27
  Administered 2016-02-26 – 2016-02-27 (×3): 60 mg via INTRAVENOUS
  Filled 2016-02-26 (×3): qty 2

## 2016-02-26 MED ORDER — SUCRALFATE 1 G PO TABS
1.0000 g | ORAL_TABLET | Freq: Four times a day (QID) | ORAL | Status: DC
  Administered 2016-02-26 – 2016-02-28 (×6): 1 g via ORAL
  Filled 2016-02-26 (×7): qty 1

## 2016-02-26 MED ORDER — AZITHROMYCIN 250 MG PO TABS
500.0000 mg | ORAL_TABLET | Freq: Every day | ORAL | Status: DC
  Administered 2016-02-27: 500 mg via ORAL
  Filled 2016-02-26: qty 2

## 2016-02-26 MED ORDER — IPRATROPIUM-ALBUTEROL 0.5-2.5 (3) MG/3ML IN SOLN
3.0000 mL | RESPIRATORY_TRACT | Status: DC
Start: 2016-02-26 — End: 2016-02-28
  Administered 2016-02-26 – 2016-02-28 (×11): 3 mL via RESPIRATORY_TRACT
  Filled 2016-02-26 (×11): qty 3

## 2016-02-26 MED ORDER — ONDANSETRON HCL 4 MG/2ML IJ SOLN: 4.0000 mg | Freq: Four times a day (QID) | INTRAMUSCULAR | Status: DC | PRN

## 2016-02-26 MED ORDER — ADULT MULTIVITAMIN W/MINERALS CH
1.0000 | ORAL_TABLET | Freq: Every day | ORAL | Status: DC
  Administered 2016-02-27 – 2016-02-28 (×2): 1 via ORAL
  Filled 2016-02-26 (×2): qty 1

## 2016-02-26 MED ORDER — FUROSEMIDE 40 MG PO TABS
40.0000 mg | ORAL_TABLET | Freq: Every day | ORAL | Status: DC
  Administered 2016-02-27 – 2016-02-28 (×2): 40 mg via ORAL
  Filled 2016-02-26 (×2): qty 1

## 2016-02-26 MED ORDER — MOMETASONE FURO-FORMOTEROL FUM 200-5 MCG/ACT IN AERO
2.0000 | INHALATION_SPRAY | Freq: Two times a day (BID) | RESPIRATORY_TRACT | Status: DC
  Administered 2016-02-26 – 2016-02-28 (×4): 2 via RESPIRATORY_TRACT
  Filled 2016-02-26: qty 8.8

## 2016-02-26 MED ORDER — ACETAMINOPHEN 650 MG RE SUPP: 650.0000 mg | Freq: Four times a day (QID) | RECTAL | Status: DC | PRN

## 2016-02-26 MED ORDER — ONDANSETRON HCL 4 MG PO TABS: 4.0000 mg | ORAL_TABLET | Freq: Four times a day (QID) | ORAL | Status: DC | PRN

## 2016-02-26 MED ORDER — DEXTROSE 5 % IV SOLN: 1.0000 g | Freq: Once | INTRAVENOUS | Status: DC

## 2016-02-26 MED ORDER — ATORVASTATIN CALCIUM 10 MG PO TABS
10.0000 mg | ORAL_TABLET | Freq: Every day | ORAL | Status: DC
  Administered 2016-02-26 – 2016-02-28 (×3): 10 mg via ORAL
  Filled 2016-02-26 (×3): qty 1

## 2016-02-26 MED ORDER — SODIUM CHLORIDE 0.9 % IV BOLUS (SEPSIS)
1000.0000 mL | Freq: Once | INTRAVENOUS | Status: AC
  Administered 2016-02-26: 1000 mL via INTRAVENOUS

## 2016-02-26 MED ORDER — IPRATROPIUM-ALBUTEROL 0.5-2.5 (3) MG/3ML IN SOLN
3.0000 mL | Freq: Once | RESPIRATORY_TRACT | Status: AC
  Administered 2016-02-26: 3 mL via RESPIRATORY_TRACT

## 2016-02-26 MED ORDER — POTASSIUM CHLORIDE CRYS ER 20 MEQ PO TBCR
40.0000 meq | EXTENDED_RELEASE_TABLET | ORAL | Status: AC
  Administered 2016-02-26 (×2): 40 meq via ORAL
  Filled 2016-02-26 (×2): qty 2

## 2016-02-26 MED ORDER — AZITHROMYCIN 500 MG PO TABS
500.0000 mg | ORAL_TABLET | Freq: Once | ORAL | Status: AC
Start: 2016-02-26 — End: 2016-02-26
  Administered 2016-02-26: 500 mg via ORAL
  Filled 2016-02-26: qty 1

## 2016-02-26 MED ORDER — THEOPHYLLINE ER 300 MG PO TB12
300.0000 mg | ORAL_TABLET | Freq: Two times a day (BID) | ORAL | Status: DC
  Administered 2016-02-26 – 2016-02-28 (×4): 300 mg via ORAL
  Filled 2016-02-26 (×5): qty 1

## 2016-02-26 MED ORDER — ACETAMINOPHEN 325 MG PO TABS: 650.0000 mg | ORAL_TABLET | Freq: Four times a day (QID) | ORAL | Status: DC | PRN

## 2016-02-26 MED ORDER — AZITHROMYCIN 250 MG PO TABS: 500.0000 mg | ORAL_TABLET | Freq: Every day | ORAL | Status: DC

## 2016-02-26 MED ORDER — CEFTRIAXONE SODIUM-DEXTROSE 1-3.74 GM-% IV SOLR
1.0000 g | INTRAVENOUS | Status: DC
  Administered 2016-02-27 – 2016-02-28 (×2): 1 g via INTRAVENOUS
  Filled 2016-02-26 (×2): qty 50

## 2016-02-26 MED ORDER — CALCIUM CITRATE-VITAMIN D 500-400 MG-UNIT PO CHEW
1.0000 | CHEWABLE_TABLET | Freq: Two times a day (BID) | ORAL | Status: DC
  Administered 2016-02-27 – 2016-02-28 (×3): 1 via ORAL
  Filled 2016-02-26 (×3): qty 1

## 2016-02-26 NOTE — ED Notes (Signed)
Dr Kerman Passey notified of critical K+ level

## 2016-02-26 NOTE — ED Notes (Signed)
Patient transported to X-ray 

## 2016-02-26 NOTE — ED Provider Notes (Signed)
[facility] Emergency Department Provider Note  Time seen: 1:05 PM  I have reviewed the triage vital signs and the nursing notes.   HISTORY  Chief Complaint Shortness of Breath    HPI Cassie Ross is a 55 y.o. female with a past medical history of asthma, CHF, COPD, hypertension, presents to the emergency department with difficulty breathing. According to the patient and husband for the past 2 weeks the patient has been having difficult to breathing. She was admitted to the hospital last week, discharged on prednisone which the patient is still taking. States she was doing better until approximately 2 days ago when she started worsening once again. Today she is extremely short of breath, taking home breathing treatments without any relief so she came to the emergency department for evaluation. Patient states chest tightness but denies any pain. States moderate shortness of breath. States cough, denies sputum production. Denies fever.  Past Medical History:  Diagnosis Date  . Asthma   . CHF (congestive heart failure) ([place])    1991- unknown after asthma attack  . Hypertension     Patient Active Problem List   Diagnosis Date Noted  . Asthma exacerbation 02/21/2016    Past Surgical History:  Procedure Laterality Date  . HERNIA REPAIR      Prior to Admission medications   Medication Sig Start Date End Date Taking? Authorizing Provider  albuterol (PROVENTIL HFA;VENTOLIN HFA) 108 (90 Base) MCG/ACT inhaler Inhale 1 puff into the lungs as needed for wheezing or shortness of breath.    Historical Provider, MD  ALPRAZolam Duanne Moron) 0.5 MG tablet Take 0.5-1 mg by mouth 2 (two) times daily.    Historical Provider, MD  ASPIRIN 81 PO Take 1 tablet by mouth daily.    Historical Provider, MD  atorvastatin (LIPITOR) 10 MG tablet Take 10 mg by mouth daily.    Historical Provider, MD  budesonide-formoterol (SYMBICORT) 160-4.5 MCG/ACT inhaler Inhale 1 puff into the lungs 2  (two) times daily.    Historical Provider, MD  Calcium Citrate-Vitamin D (CALCIUM + D PO) Take 2 tablets by mouth 2 (two) times daily.    Historical Provider, MD  diltiazem (CARDIZEM CD) 180 MG 24 hr capsule Take 180 mg by mouth 2 (two) times daily.    Historical Provider, MD  furosemide (LASIX) 80 MG tablet Take 80 mg by mouth 2 (two) times daily.    Historical Provider, MD  loratadine (CLARITIN) 10 MG tablet Take 10 mg by mouth daily.    Historical Provider, MD  montelukast (SINGULAIR) 10 MG tablet Take 10 mg by mouth daily.    Historical Provider, MD  Multiple Vitamins-Minerals (ONE-A-DAY MENOPAUSE FORMULA PO) Take 1 tablet by mouth daily.    Historical Provider, MD  pantoprazole (PROTONIX) 40 MG tablet Take 40 mg by mouth daily.    Historical Provider, MD  potassium chloride (K-DUR,KLOR-CON) 10 MEQ tablet Take 10 mEq by mouth 2 (two) times daily.    Historical Provider, MD  predniSONE (STERAPRED UNI-PAK 21 TAB) 10 MG (21) TBPK tablet Take 1 tablet (10 mg total) by mouth daily. 60 mg once daily, taper 10 mg daily until done 02/22/16   Max Sane, MD  sucralfate (CARAFATE) 1 g tablet Take 1 g by mouth 4 (four) times daily.    Historical Provider, MD  theophylline (THEODUR) 300 MG 12 hr tablet Take 300 mg by mouth 2 (two) times daily.    Historical Provider, MD    Allergies  Allergen Reactions  .  Penicillins     Has patient had a PCN reaction causing immediate rash, facial/tongue/throat swelling, SOB or lightheadedness with hypotension: no Has patient had a PCN reaction causing severe rash involving mucus membranes or skin necrosis: yes Has patient had a PCN reaction that required hospitalization no Has patient had a PCN reaction occurring within the last 10 years: no If all of the above answers are "NO", then may proceed with Cephalosporin use.   . Shellfish Allergy Swelling  . Sunflower Oil Swelling    seeds  . Levaquin [Levofloxacin In D5w] Rash    Pill form    No family history on  file.  Social History Social History  Substance Use Topics  . Smoking status: Never Smoker  . Smokeless tobacco: Never Used  . Alcohol use No    Review of Systems Constitutional: Negative for fever. Cardiovascular: Positive for chest tightness. Respiratory: Positive shortness of breath. Positive for wheeze. Gastrointestinal: Negative for abdominal pain Neurological: Negative for headache 10-point ROS otherwise negative.  ____________________________________________   PHYSICAL EXAM:  VITAL SIGNS: ED Triage Vitals  Enc Vitals Group     BP 02/26/16 1257 (!) 148/88     Pulse Rate 02/26/16 1257 (!) 101     Resp 02/26/16 1257 (!) 26     Temp 02/26/16 1257 98.5 F (36.9 C)     Temp Source 02/26/16 1257 Oral     SpO2 02/26/16 1257 90 %     Weight 02/26/16 1257 204 lb (92.5 kg)     Height --      Head Circumference --      Peak Flow --      Pain Score 02/26/16 1258 5     Pain Loc --      Pain Edu? --      Excl. in [place]? --     Constitutional: Alert and oriented. Well appearing and in no distress. Eyes: Normal exam ENT   Head: Normocephalic and atraumatic   Mouth/Throat: Mucous membranes are moist. Cardiovascular: Normal rate, regular rhythm. No murmur Respiratory: Mild tachypnea, moderate expiratory wheeze bilaterally. No rales or rhonchi. Occasional cough during examination. Gastrointestinal: Soft and nontender. No distention. Musculoskeletal: Nontender with normal range of motion in all extremities.  Neurologic:  Normal speech and language. No gross focal neurologic deficits  Skin:  Skin is warm, dry and intact.  Psychiatric: Mood and affect are normal.   ____________________________________________    EKG  EKG reviewed and interpreted by myself shows normal sinus rhythm at 100 bpm, narrow QRS, normal axis, normal intervals, nonspecific ST changes without ST elevation.  ____________________________________________    RADIOLOGY  X-ray shows bronchitis  versus possible pneumonia.  ____________________________________________   INITIAL IMPRESSION / ASSESSMENT AND PLAN / ED COURSE  Pertinent labs & imaging results that were available during my care of the patient were reviewed by me and considered in my medical decision making (see chart for details).  The patient presents to the emergency department with difficulty breathing over the past 2 weeks, much worse over the past 2 days, admission last week for the same. Patient has a history of asthma, CHF and COPD. Does not smoke cigarettes. Patient with significant wheeze bilaterally, occasional cough. 90% room air saturation. We will treat with DuoNeb's, Solu-Medrol, IV magnesium, fluids and close to monitor in the emergency department while awaiting labs and chest x-ray result appears.  Patient continues to be short of breath, breath sounds have improved after breathing treatments however the patient continues to desat into  the 80s, 88% with a good waveform. No home O2 requirement. Patient drops as low as 84% on room air. Given the patient's hypoxia with questionable pneumonia we will check blood cultures, cover with Levaquin, admitted to the hospital for further treatment. ____________________________________________   FINAL CLINICAL IMPRESSION(S) / ED DIAGNOSES  Dyspnea Asthma exacerbation pneumonia   Harvest Dark, MD 02/26/16 1500

## 2016-02-26 NOTE — ED Notes (Signed)
Pt escorted to bathroom - pt became short of breath ambulating to bathroom and desat to 88% off of o2 - placed back to bed and o2 via n/c at 2L started back - o2 sat increaxed to 95%

## 2016-02-26 NOTE — H&P (Signed)
[facility] [place] NAME: Cassie Ross    MR#:  [id number]  DATE OF BIRTH:  December 10, 1960  DATE OF ADMISSION:  02/26/2016  PRIMARY CARE PHYSICIAN: Lavera Guise, MD   REQUESTING/REFERRING PHYSICIAN: Dr. Harvest Dark  CHIEF COMPLAINT:   Chief Complaint  Patient presents with  . Shortness of Breath    HISTORY OF PRESENT ILLNESS:  Cassie Ross  is a 55 y.o. female with a known history of Asthma and COPD not on home oxygen, diastolic CHF, hypertension presents to hospital after recent discharge last week secondary to worsening breathing. Patient states that over the last year, her breathing has gotten worse. She is following with a pulmonologist who is planning to do an allergy test. She had prior intubations secondary to asthma exacerbation in the past. He is admitted last week for worsening shortness of breath and was treated with steroids and discharge. She has cough with mucoid phlegm. Denies any fevers or chills. Her white count is elevated because she has been on steroids. Chest x-ray this time shows bronchitis changes and possible lingular infiltrate. She was tested for home oxygen last week and did not qualify at the time. According to husband, her breathing episodes get worse at nighttime. Last night when she went to bed, she woke up and felt that she couldn't breathe. She was audibly wheezing and very tight on exam.  PAST MEDICAL HISTORY:   Past Medical History:  Diagnosis Date  . Asthma   . CHF (congestive heart failure) ([place])    1991- unknown after asthma attack  . COPD (chronic obstructive pulmonary disease) ([place])   . Hypertension   . Hypertension     PAST SURGICAL HISTORY:   Past Surgical History:  Procedure Laterality Date  . CESAREAN SECTION    . CHOLECYSTECTOMY    . ESOPHAGEAL DILATION    . HERNIA REPAIR    . NASAL SINUS SURGERY      SOCIAL HISTORY:   Social History  Substance Use Topics  . Smoking  status: Never Smoker  . Smokeless tobacco: Never Used  . Alcohol use No    FAMILY HISTORY:   Family History  Problem Relation Age of Onset  . Hypertension Mother     DRUG ALLERGIES:   Allergies  Allergen Reactions  . Penicillins     Has patient had a PCN reaction causing immediate rash, facial/tongue/throat swelling, SOB or lightheadedness with hypotension: no Has patient had a PCN reaction causing severe rash involving mucus membranes or skin necrosis: yes Has patient had a PCN reaction that required hospitalization no Has patient had a PCN reaction occurring within the last 10 years: no If all of the above answers are "NO", then may proceed with Cephalosporin use.   . Shellfish Allergy Swelling  . Sunflower Oil Swelling    seeds  . Levaquin [Levofloxacin In D5w] Rash    Pill form    REVIEW OF SYSTEMS:   Review of Systems  Constitutional: Positive for malaise/fatigue. Negative for chills, fever and weight loss.  HENT: Negative for ear discharge, ear pain, hearing loss and nosebleeds.   Eyes: Negative for blurred vision, double vision and photophobia.  Respiratory: Positive for cough, shortness of breath and wheezing. Negative for hemoptysis.   Cardiovascular: Positive for orthopnea. Negative for chest pain, palpitations and leg swelling.  Gastrointestinal: Positive for heartburn. Negative for abdominal pain, constipation, diarrhea, melena, nausea and vomiting.  Genitourinary: Negative for dysuria, frequency and urgency.  Musculoskeletal: Negative for back pain, myalgias and neck pain.  Skin: Negative for rash.  Neurological: Negative for dizziness, tingling, sensory change, speech change, focal weakness and headaches.  Endo/Heme/Allergies: Does not bruise/bleed easily.  Psychiatric/Behavioral: Negative for depression.    MEDICATIONS AT HOME:   Prior to Admission medications   Medication Sig Start Date End Date Taking? Authorizing Provider  albuterol (PROVENTIL  HFA;VENTOLIN HFA) 108 (90 Base) MCG/ACT inhaler Inhale 1 puff into the lungs as needed for wheezing or shortness of breath.    Historical Provider, MD  ALPRAZolam Duanne Moron) 0.5 MG tablet Take 0.5-1 mg by mouth 2 (two) times daily.    Historical Provider, MD  ASPIRIN 81 PO Take 1 tablet by mouth daily.    Historical Provider, MD  atorvastatin (LIPITOR) 10 MG tablet Take 10 mg by mouth daily.    Historical Provider, MD  budesonide-formoterol (SYMBICORT) 160-4.5 MCG/ACT inhaler Inhale 1 puff into the lungs 2 (two) times daily.    Historical Provider, MD  Calcium Citrate-Vitamin D (CALCIUM + D PO) Take 2 tablets by mouth 2 (two) times daily.    Historical Provider, MD  diltiazem (CARDIZEM CD) 180 MG 24 hr capsule Take 180 mg by mouth 2 (two) times daily.    Historical Provider, MD  furosemide (LASIX) 80 MG tablet Take 80 mg by mouth 2 (two) times daily.    Historical Provider, MD  loratadine (CLARITIN) 10 MG tablet Take 10 mg by mouth daily.    Historical Provider, MD  montelukast (SINGULAIR) 10 MG tablet Take 10 mg by mouth daily.    Historical Provider, MD  Multiple Vitamins-Minerals (ONE-A-DAY MENOPAUSE FORMULA PO) Take 1 tablet by mouth daily.    Historical Provider, MD  pantoprazole (PROTONIX) 40 MG tablet Take 40 mg by mouth daily.    Historical Provider, MD  potassium chloride (K-DUR,KLOR-CON) 10 MEQ tablet Take 10 mEq by mouth 2 (two) times daily.    Historical Provider, MD  predniSONE (STERAPRED UNI-PAK 21 TAB) 10 MG (21) TBPK tablet Take 1 tablet (10 mg total) by mouth daily. 60 mg once daily, taper 10 mg daily until done 02/22/16   Max Sane, MD  sucralfate (CARAFATE) 1 g tablet Take 1 g by mouth 4 (four) times daily.    Historical Provider, MD  theophylline (THEODUR) 300 MG 12 hr tablet Take 300 mg by mouth 2 (two) times daily.    Historical Provider, MD      VITAL SIGNS:  Blood pressure 135/74, pulse 96, temperature 98.5 F (36.9 C), temperature source Oral, resp. rate 18, weight 92.5 kg  (204 lb), SpO2 95 %.  PHYSICAL EXAMINATION:   Physical Exam  GENERAL:  55 y.o.-year-old patient lying in the bed with no acute distress.  EYES: Pupils equal, round, reactive to light and accommodation. No scleral icterus. Extraocular muscles intact.  HEENT: Head atraumatic, normocephalic. Oropharynx and nasopharynx clear.  NECK:  Supple, no jugular venous distention. No thyroid enlargement, no tenderness.  LUNGS: tight and scant breath sounds bilaterally, Scattered expiratory wheeze present, no rales,rhonchi or crepitation. No use of accessory muscles of respiration.  CARDIOVASCULAR: S1, S2 normal. No murmurs, rubs, or gallops.  ABDOMEN: Soft, nontender, nondistended. Bowel sounds present. No organomegaly or mass.  EXTREMITIES: No pedal edema, cyanosis, or clubbing.  NEUROLOGIC: Cranial nerves II through XII are intact. Muscle strength 5/5 in all extremities. Sensation intact. Gait not checked.  PSYCHIATRIC: The patient is alert and oriented x 3.  SKIN: No obvious rash, lesion, or ulcer.   LABORATORY PANEL:  CBC  Recent Labs Lab 02/26/16 1313  WBC 19.1*  HGB 15.3  HCT 44.3  PLT 368   ------------------------------------------------------------------------------------------------------------------  Chemistries   Recent Labs Lab 02/21/16 0707 02/21/16 1227  02/26/16 1313  NA 140  --   < > 140  K 3.4*  --   < > 2.6*  CL 105  --   < > 97*  CO2 24  --   < > 31  GLUCOSE 156*  --   < > 166*  BUN 14  --   < > 15  CREATININE 0.62  --   < > 0.65  CALCIUM 9.0  --   < > 9.2  MG 1.8 2.2  --   --   AST 25  --   --   --   ALT 16  --   --   --   ALKPHOS 109  --   --   --   BILITOT 0.8  --   --   --   < > = values in this interval not displayed. ------------------------------------------------------------------------------------------------------------------  Cardiac Enzymes  Recent Labs Lab 02/26/16 1313  TROPONINI <0.03    ------------------------------------------------------------------------------------------------------------------  RADIOLOGY:  Dg Chest 2 View  Result Date: 02/26/2016 CLINICAL DATA:  SOB, Dyspnea that started this AM. Patient states that it is an asthma attack. Hx: Non-smoker. COPD, CHF, HTN, and is diabetic when on prednisone according to patient's husband. EXAM: CHEST  2 VIEW COMPARISON:  02/21/2016 FINDINGS: Airway thickening is present, suggesting bronchitis or reactive airways disease. Accentuated lingular opacity favoring atelectasis or pneumonia over epicardial adipose tissue given the prominence. Reverse lordotic projection complicates this assessment. The lungs appear otherwise clear. IMPRESSION: 1. Airway thickening is present, suggesting bronchitis or reactive airways disease. 2. Difficult to exclude airspace opacity along the lingula. The projection is reverse lordotic on the frontal view, which might tend to exaggerate the epicardial adipose tissue. Correlate with anterior breath sounds along the left lung base. Electronically Signed   By: Van Clines M.D.   On: 02/26/2016 14:14    EKG:   Orders placed or performed during the hospital encounter of 02/26/16  . ED EKG  . ED EKG    IMPRESSION AND PLAN:   Logyn Woolridge  is a 55 y.o. female with a known history of Asthma and COPD not on home oxygen, diastolic CHF, hypertension presents to hospital after recent discharge last week secondary to worsening breathing.  #1 acute asthma exacerbation with acute bronchitis- -Blood cultures are pending. Admit for IV steroids. -Pulmonary consulted.  Continue duo nebs, inhalers. -Added Rocephin and azithromycin with lingular infiltrate and bronchitis changes on chest x-ray. -Oxygen support as needed. Overnight pulse oximetry  #2 diastolic CHF-well compensated. Decrease Lasix dose. Monitor closely.  #3 hypokalemia-secondary to being on high doses of Lasix. Lasix dose decreased.  Potassium being replaced. Follow up in a.m.  #4 hypertension-continue home medications.  #5 DVT prophylaxis-on Lovenox    All the records are reviewed and case discussed with ED provider. Management plans discussed with the patient, family and they are in agreement.  CODE STATUS: Full code  TOTAL TIME TAKING CARE OF THIS PATIENT: 50 minutes.    Gladstone Lighter M.D on 02/26/2016 at 3:47 PM  Between 7am to 6pm - Pager - [phone]  After 6pm go to [email or web address] - password [email or web address] [facility]  Office  [phone]  CC: Primary care physician; Lavera Guise, MD

## 2016-02-26 NOTE — ED Triage Notes (Signed)
Pt with shortness of breath since last Wednesday on and off. Was seen and admitted last week for same. Pt unable to talk in triage d/t breathing. Pt tachypnea in triage using accessory  Muscles to breath.

## 2016-02-26 NOTE — Progress Notes (Signed)
Pharmacy Antibiotic Note  Cassie Ross is a 55 y.o. female admitted on 02/26/2016 with pneumonia.  Pharmacy has been consulted for Ceftriaxone dosing.  Patient also on azithromycin.  Plan: Patient received Ceftriaxone 1 gram IV x 1 in ER. Will continue with Ceftriaxone 1 gram IV q24h.    Weight: 204 lb (92.5 kg)  Temp (24hrs), Avg:98.5 F (36.9 C), Min:98.5 F (36.9 C), Max:98.5 F (36.9 C)   Recent Labs Lab 02/21/16 0707 02/22/16 0621 02/26/16 1313  WBC 14.5* 11.1* 19.1*  CREATININE 0.62 0.59 0.65    Estimated Creatinine Clearance: 83.8 mL/min (by C-G formula based on SCr of 0.65 mg/dL).    Allergies  Allergen Reactions  . Penicillins     Has patient had a PCN reaction causing immediate rash, facial/tongue/throat swelling, SOB or lightheadedness with hypotension: no Has patient had a PCN reaction causing severe rash involving mucus membranes or skin necrosis: no Has patient had a PCN reaction that required hospitalization no Has patient had a PCN reaction occurring within the last 10 years: no If all of the above answers are "NO", then may proceed with Cephalosporin use.   . Shellfish Allergy Swelling  . Sunflower Oil Swelling    seeds  . Levaquin [Levofloxacin In D5w] Rash    Pill form    Antimicrobials this admission: CTX 12/18 >>   Azith 12/18 >>    Dose adjustments this admission:    Microbiology results: 12/18 BCx: pending   UCx:      Sputum:      MRSA PCR:    Thank you for allowing pharmacy to be a part of this patient's care.  Courtlynn Holloman A 02/26/2016 6:56 PM

## 2016-02-26 NOTE — ED Notes (Signed)
Noted that when pt falls asleep her o2 sats drop to the mid 80's - twice this nurse has woke pt up and encouraged breathing in through nose and out through mouth to increase o2 sats to 90's - at this time pt placed on o2 via n/c at 2L - increased o2 sats to 95-96%

## 2016-02-27 LAB — CBC
HCT: 41.6 % (ref 35.0–47.0)
HEMOGLOBIN: 14.2 g/dL (ref 12.0–16.0)
MCH: 31.2 pg (ref 26.0–34.0)
MCHC: 34.2 g/dL (ref 32.0–36.0)
MCV: 91.4 fL (ref 80.0–100.0)
Platelets: 283 10*3/uL (ref 150–440)
RBC: 4.55 MIL/uL (ref 3.80–5.20)
RDW: 12.9 % (ref 11.5–14.5)
WBC: 13.2 10*3/uL — AB (ref 3.6–11.0)

## 2016-02-27 LAB — BASIC METABOLIC PANEL
ANION GAP: 10 (ref 5–15)
BUN: 15 mg/dL (ref 6–20)
CHLORIDE: 100 mmol/L — AB (ref 101–111)
CO2: 30 mmol/L (ref 22–32)
Calcium: 8.3 mg/dL — ABNORMAL LOW (ref 8.9–10.3)
Creatinine, Ser: 0.63 mg/dL (ref 0.44–1.00)
GFR calc non Af Amer: >60 mL/min (ref >60)
Glucose, Bld: 278 mg/dL — ABNORMAL HIGH (ref 65–99)
Potassium: 3.9 mmol/L (ref 3.5–5.1)
SODIUM: 140 mmol/L (ref 135–145)

## 2016-02-27 LAB — HEMOGLOBIN A1C
Hgb A1c MFr Bld: 7.7 % — ABNORMAL HIGH (ref 4.8–5.6)
Mean Plasma Glucose: 174 mg/dL

## 2016-02-27 LAB — GLUCOSE, CAPILLARY
GLUCOSE-CAPILLARY: 298 mg/dL — AB (ref 65–99)
GLUCOSE-CAPILLARY: 345 mg/dL — AB (ref 65–99)
Glucose-Capillary: 326 mg/dL — ABNORMAL HIGH (ref 65–99)
Glucose-Capillary: 232 mg/dL — ABNORMAL HIGH (ref 65–99)

## 2016-02-27 LAB — THEOPHYLLINE LEVEL: Theophylline Lvl: 16.4 ug/mL (ref 8.0–20.0)

## 2016-02-27 LAB — PROCALCITONIN: Procalcitonin: <0.1 ng/mL

## 2016-02-27 MED ORDER — GUAIFENESIN ER 600 MG PO TB12
600.0000 mg | ORAL_TABLET | Freq: Two times a day (BID) | ORAL | Status: DC
  Administered 2016-02-27 – 2016-02-28 (×3): 600 mg via ORAL
  Filled 2016-02-27 (×3): qty 1

## 2016-02-27 MED ORDER — INSULIN ASPART 100 UNIT/ML ~~LOC~~ SOLN
0.0000 [IU] | Freq: Three times a day (TID) | SUBCUTANEOUS | Status: DC
  Administered 2016-02-27 (×2): 11 [IU] via SUBCUTANEOUS
  Administered 2016-02-28: 8 [IU] via SUBCUTANEOUS
  Administered 2016-02-28: 5 [IU] via SUBCUTANEOUS
  Filled 2016-02-27: qty 8
  Filled 2016-02-27: qty 5
  Filled 2016-02-27 (×2): qty 11

## 2016-02-27 MED ORDER — METHYLPREDNISOLONE SODIUM SUCC 125 MG IJ SOLR
60.0000 mg | INTRAMUSCULAR | Status: DC
  Administered 2016-02-28: 60 mg via INTRAVENOUS
  Filled 2016-02-27: qty 2

## 2016-02-27 NOTE — Progress Notes (Signed)
[facility] [place] NAME: Cassie Ross    MRN#:  [id number]  DATE OF BIRTH:  27-Jan-1961  SUBJECTIVE:  Hospital Day: 1 day Cassie Ross is a 55 y.o. female presenting with Shortness of Breath .   Overnight events: no overnight events Interval Events: still some chest congestion but breathing overall improved  REVIEW OF SYSTEMS:  CONSTITUTIONAL: No fever, fatigue or weakness.  EYES: No blurred or double vision.  EARS, NOSE, AND THROAT: No tinnitus or ear pain.  RESPIRATORY: positive cough, shortness of breath, wheezing or hemoptysis.  CARDIOVASCULAR: No chest pain, orthopnea, edema.  GASTROINTESTINAL: No nausea, vomiting, diarrhea or abdominal pain.  GENITOURINARY: No dysuria, hematuria.  ENDOCRINE: No polyuria, nocturia,  HEMATOLOGY: No anemia, easy bruising or bleeding SKIN: No rash or lesion. MUSCULOSKELETAL: No joint pain or arthritis.   NEUROLOGIC: No tingling, numbness, weakness.  PSYCHIATRY: No anxiety or depression.   DRUG ALLERGIES:   Allergies  Allergen Reactions  . Penicillins     Has patient had a PCN reaction causing immediate rash, facial/tongue/throat swelling, SOB or lightheadedness with hypotension: no Has patient had a PCN reaction causing severe rash involving mucus membranes or skin necrosis: no Has patient had a PCN reaction that required hospitalization no Has patient had a PCN reaction occurring within the last 10 years: no If all of the above answers are "NO", then may proceed with Cephalosporin use.   . Shellfish Allergy Swelling  . Sunflower Oil Swelling    seeds  . Levaquin [Levofloxacin In D5w] Rash    Pill form    VITALS:  Blood pressure 124/73, pulse 99, temperature 97.8 F (36.6 C), temperature source Oral, resp. rate 18, height 5\' 3"  (1.6 m), weight 90.7 kg (200 lb), SpO2 92 %.  PHYSICAL EXAMINATION:  VITAL SIGNS: Vitals:   02/27/16 0802 02/27/16 1059  BP: 121/69 124/73   Pulse: 95 99  Resp: 18   Temp: 97.7 F (36.5 C) 97.8 F (36.6 C)   GENERAL:55 y.o.female currently in no acute distress.  HEAD: Normocephalic, atraumatic.  EYES: Pupils equal, round, reactive to light. Extraocular muscles intact. No scleral icterus.  MOUTH: Moist mucosal membrane. Dentition intact. No abscess noted.  EAR, NOSE, THROAT: Clear without exudates. No external lesions.  NECK: Supple. No thyromegaly. No nodules. No JVD.  PULMONARY: diffuse expiratory wheeze without rails or rhonci. No use of accessory muscles, Good respiratory effort. good air entry bilaterally CHEST: Nontender to palpation.  CARDIOVASCULAR: S1 and S2. Regular rate and rhythm. No murmurs, rubs, or gallops. No edema. Pedal pulses 2+ bilaterally.  GASTROINTESTINAL: Soft, nontender, nondistended. No masses. Positive bowel sounds. No hepatosplenomegaly.  MUSCULOSKELETAL: No swelling, clubbing, or edema. Range of motion full in all extremities.  NEUROLOGIC: Cranial nerves II through XII are intact. No gross focal neurological deficits. Sensation intact. Reflexes intact.  SKIN: No ulceration, lesions, rashes, or cyanosis. Skin warm and dry. Turgor intact.  PSYCHIATRIC: Mood, affect within normal limits. The patient is awake, alert and oriented x 3. Insight, judgment intact.      LABORATORY PANEL:   CBC  Recent Labs Lab 02/27/16 0250  WBC 13.2*  HGB 14.2  HCT 41.6  PLT 283   ------------------------------------------------------------------------------------------------------------------  Chemistries   Recent Labs Lab 02/21/16 0707 02/21/16 1227  02/27/16 0250  NA 140  --   < > 140  K 3.4*  --   < > 3.9  CL 105  --   < > 100*  CO2 24  --   < >  30  GLUCOSE 156*  --   < > 278*  BUN 14  --   < > 15  CREATININE 0.62  --   < > 0.63  CALCIUM 9.0  --   < > 8.3*  MG 1.8 2.2  --   --   AST 25  --   --   --   ALT 16  --   --   --   ALKPHOS 109  --   --   --   BILITOT 0.8  --   --   --   < > =  values in this interval not displayed. ------------------------------------------------------------------------------------------------------------------  Cardiac Enzymes  Recent Labs Lab 02/26/16 1313  TROPONINI <0.03   ------------------------------------------------------------------------------------------------------------------  RADIOLOGY:  Dg Chest 2 View  Result Date: 02/26/2016 CLINICAL DATA:  SOB, Dyspnea that started this AM. Patient states that it is an asthma attack. Hx: Non-smoker. COPD, CHF, HTN, and is diabetic when on prednisone according to patient's husband. EXAM: CHEST  2 VIEW COMPARISON:  02/21/2016 FINDINGS: Airway thickening is present, suggesting bronchitis or reactive airways disease. Accentuated lingular opacity favoring atelectasis or pneumonia over epicardial adipose tissue given the prominence. Reverse lordotic projection complicates this assessment. The lungs appear otherwise clear. IMPRESSION: 1. Airway thickening is present, suggesting bronchitis or reactive airways disease. 2. Difficult to exclude airspace opacity along the lingula. The projection is reverse lordotic on the frontal view, which might tend to exaggerate the epicardial adipose tissue. Correlate with anterior breath sounds along the left lung base. Electronically Signed   By: Van Clines M.D.   On: 02/26/2016 14:14    EKG:   Orders placed or performed during the hospital encounter of 02/26/16  . ED EKG  . ED EKG    ASSESSMENT AND PLAN:   Cassie Ross is a 55 y.o. female presenting with Shortness of Breath . Admitted 02/26/2016 : Day #: 1 day 1. Acute asthma exacerbation: decrease steroids, continue breathing treatments, oxygen as required, steroids (decrease dosage). Possible pneumonia: continue ceftriaxone/azithromycin, check pro calcitonin and theophyline level   2. Type 2 diabetes poorly controlled: decrease steroids, should hopefully help, increase sliding coverage 3. Hypok:  replaced 4.hyperlipidemia unspecified: statin  All the records are reviewed and case discussed with Care Management/Social Workerr. Management plans discussed with the patient, family and they are in agreement.  CODE STATUS: full TOTAL TIME TAKING CARE OF THIS PATIENT: 33 minutes.   POSSIBLE D/C IN 1-2DAYS, DEPENDING ON CLINICAL CONDITION.   Hower,  Karenann Cai.D on 02/27/2016 at 3:17 PM  Between 7am to 6pm - Pager - [phone]  After 6pm: House Pager: - [phone]  [facility]  Office  [phone]  CC: Primary care physician; Lavera Guise, MD
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
## 2016-02-28 LAB — GLUCOSE, CAPILLARY
Glucose-Capillary: 278 mg/dL — ABNORMAL HIGH (ref 65–99)
Glucose-Capillary: 202 mg/dL — ABNORMAL HIGH (ref 65–99)

## 2016-02-28 MED ORDER — PREDNISONE 10 MG PO TABS: ORAL_TABLET | ORAL | 0 refills | Status: DC

## 2016-02-28 MED ORDER — AZITHROMYCIN 250 MG PO TABS: 250.0000 mg | ORAL_TABLET | Freq: Every day | ORAL | 0 refills | Status: AC

## 2016-02-28 MED ORDER — GUAIFENESIN ER 600 MG PO TB12: 600.0000 mg | ORAL_TABLET | Freq: Two times a day (BID) | ORAL | 0 refills | Status: DC | PRN

## 2016-02-28 NOTE — Care Management Important Message (Signed)
Important Message  Patient Details  Name: KADISON NISHIMURA MRN: [id number] Date of Birth: 08/07/1960   Medicare Important Message Given:  N/A - LOS <3 / Initial given by admissions    Katrina Stack, RN 02/28/2016, 11:59 AM

## 2016-02-28 NOTE — Care Management (Signed)
Patient is for discharge home today. Attending states there are no discharge needs.

## 2016-02-28 NOTE — Discharge Summary (Signed)
[facility] [place] NAME: Cassie Ross    MR#:  [id number]  DATE OF BIRTH:  January 07, 1961  DATE OF ADMISSION:  02/26/2016 ADMITTING PHYSICIAN: Gladstone Lighter, MD  DATE OF DISCHARGE: 02/28/16  PRIMARY CARE PHYSICIAN: Lavera Guise, MD    ADMISSION DIAGNOSIS:  COPD exacerbation ([place]) [J44.1] Moderate asthma with exacerbation, unspecified whether persistent [J45.901] Community acquired pneumonia of left lung, unspecified part of lung [J18.9]  DISCHARGE DIAGNOSIS:  Pneumonia ruled out  Asthma exacerbation  SECONDARY DIAGNOSIS:   Past Medical History:  Diagnosis Date  . Asthma   . CHF (congestive heart failure) ([place])    1991- unknown after asthma attack  . COPD (chronic obstructive pulmonary disease) ([place])   . Hypertension   . Hypertension     HOSPITAL COURSE:  Cassie Ross  is a 55 y.o. female admitted 02/26/2016 with chief complaint Shortness of Breath . Please see H&P performed by Gladstone Lighter, MD for further information. Patient presented with the above symptoms. Treated for asthma exacerbation with improvement of symptoms -- breathing treatments, steroids, antibiotics.  No evidence on pnuemonia  DISCHARGE CONDITIONS:   stable  CONSULTS OBTAINED:  Treatment Team:  Allyne Gee, MD  DRUG ALLERGIES:   Allergies  Allergen Reactions  . Penicillins     Has patient had a PCN reaction causing immediate rash, facial/tongue/throat swelling, SOB or lightheadedness with hypotension: no Has patient had a PCN reaction causing severe rash involving mucus membranes or skin necrosis: no Has patient had a PCN reaction that required hospitalization no Has patient had a PCN reaction occurring within the last 10 years: no If all of the above answers are "NO", then may proceed with Cephalosporin use.   . Shellfish Allergy Swelling  . Sunflower Oil Swelling    seeds  . Levaquin [Levofloxacin In D5w] Rash    Pill form     DISCHARGE MEDICATIONS:   Current Discharge Medication List    START taking these medications   Details  azithromycin (ZITHROMAX) 250 MG tablet Take 1 tablet (250 mg total) by mouth daily. Qty: 4 each, Refills: 0    guaiFENesin (MUCINEX) 600 MG 12 hr tablet Take 1 tablet (600 mg total) by mouth 2 (two) times daily as needed for cough or to loosen phlegm. Qty: 39 tablet, Refills: 0    predniSONE (DELTASONE) 10 MG tablet 40mg  x1day, 20mg x2 day, 10mg x2 day then stop Qty: 10 tablet, Refills: 0      CONTINUE these medications which have NOT CHANGED   Details  albuterol (PROVENTIL HFA;VENTOLIN HFA) 108 (90 Base) MCG/ACT inhaler Inhale 1 puff into the lungs as needed for wheezing or shortness of breath.    ALPRAZolam (XANAX) 0.5 MG tablet Take 0.5-1 mg by mouth 2 (two) times daily.    aspirin EC 81 MG tablet Take 81 mg by mouth daily.    atorvastatin (LIPITOR) 10 MG tablet Take 10 mg by mouth daily.    budesonide-formoterol (SYMBICORT) 160-4.5 MCG/ACT inhaler Inhale 1 puff into the lungs 2 (two) times daily.    Calcium Citrate-Vitamin D (CALCIUM + D PO) Take 2 tablets by mouth 2 (two) times daily.    diltiazem (CARDIZEM CD) 180 MG 24 hr capsule Take 180 mg by mouth 2 (two) times daily.    furosemide (LASIX) 80 MG tablet Take 80 mg by mouth 2 (two) times daily.    insulin lispro (HUMALOG) 100 UNIT/ML injection Inject 2-10 Units into the skin 3 (three) times daily  before meals. Based on sliding scale. For BS of 150-200, take 2 units, 201-250, take 4 units, 251-300, take 6 units, 301-350 take 8 units, 351-400 take 10 units. Over 400 contact physician.    loratadine (CLARITIN) 10 MG tablet Take 10 mg by mouth daily.    montelukast (SINGULAIR) 10 MG tablet Take 10 mg by mouth daily.    Multiple Vitamins-Minerals (ONE-A-DAY MENOPAUSE FORMULA PO) Take 1 tablet by mouth daily.    pantoprazole (PROTONIX) 40 MG tablet Take 40 mg by mouth daily.    potassium chloride (K-DUR,KLOR-CON)  10 MEQ tablet Take 10 mEq by mouth 2 (two) times daily.    sucralfate (CARAFATE) 1 g tablet Take 1 g by mouth 4 (four) times daily.    theophylline (THEODUR) 300 MG 12 hr tablet Take 300 mg by mouth 2 (two) times daily.      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   Oxygen Delivery: room air  DISCHARGE LOCATION:  home   If you experience worsening of your admission symptoms, develop shortness of breath, life threatening emergency, suicidal or homicidal thoughts you must seek medical attention immediately by calling 911 or calling your MD immediately  if symptoms less severe.  You Must read complete instructions/literature along with all the possible adverse reactions/side effects for all the Medicines you take and that have been prescribed to you. Take any new Medicines after you have completely understood and accpet all the possible adverse reactions/side effects.   Please note  You were cared for by a hospitalist during your hospital stay. If you have any questions about your discharge medications or the care you received while you were in the hospital after you are discharged, you can call the unit and asked to speak with the hospitalist on call if the hospitalist that took care of you is not available. Once you are discharged, your primary care physician will handle any further medical issues. Please note that NO REFILLS for any discharge medications will be authorized once you are discharged, as it is imperative that you return to your primary care physician (or establish a relationship with a primary care physician if you do not have one) for your aftercare needs so  that they can reassess your need for medications and monitor your lab values.    On the day of Discharge:   VITAL SIGNS:  Blood pressure 121/67, pulse 84, temperature 97.9 F (36.6 C), temperature source Oral, resp. rate (!) 22, height 5\' 3"  (1.6 m), weight 90.7 kg (200 lb), SpO2 96 %.  I/O:   Intake/Output Summary (Last 24 hours) at 02/28/16 1141 Last data filed at 02/28/16 1026  Gross per 24 hour  Intake              720 ml  Output              850 ml  Net             -130 ml    PHYSICAL EXAMINATION:  GENERAL:  55 y.o.-year-old patient lying in the bed with no acute distress.  EYES: Pupils  equal, round, reactive to light and accommodation. No scleral icterus. Extraocular muscles intact.  HEENT: Head atraumatic, normocephalic. Oropharynx and nasopharynx clear.  NECK:  Supple, no jugular venous distention. No thyroid enlargement, no tenderness.  LUNGS: scant expiratory wheezing, rales,rhonchi or crepitation. No use of accessory muscles of respiration.  CARDIOVASCULAR: S1, S2 normal. No murmurs, rubs, or gallops.  ABDOMEN: Soft, non-tender, non-distended. Bowel sounds present. No organomegaly or mass.  EXTREMITIES: No pedal edema, cyanosis, or clubbing.  NEUROLOGIC: Cranial nerves II through XII are intact. Muscle strength 5/5 in all extremities. Sensation intact. Gait not checked.  PSYCHIATRIC: The patient is alert and oriented x 3.  SKIN: No obvious rash, lesion, or ulcer.   DATA REVIEW:   CBC  Recent Labs Lab 02/27/16 0250  WBC 13.2*  HGB 14.2  HCT 41.6  PLT 283    Chemistries   Recent Labs Lab 02/21/16 1227  02/27/16 0250  NA  --   < > 140  K  --   < > 3.9  CL  --   < > 100*  CO2  --   < > 30  GLUCOSE  --   < > 278*  BUN  --   < > 15  CREATININE  --   < > 0.63  CALCIUM  --   < > 8.3*  MG 2.2  --   --   < > = values in this interval not displayed.  Cardiac Enzymes  Recent Labs Lab 02/26/16 1313  TROPONINI <0.03    Microbiology Results    Results for orders placed or performed during the hospital encounter of 02/26/16  Blood culture (routine x 2)     Status: None (Preliminary result)   Collection Time: 02/26/16  3:31 PM  Result Value Ref Range Status   Specimen Description BLOOD R FOREARM  Final   Special Requests   Final    BOTTLES DRAWN AEROBIC AND ANAEROBIC  AER 15 ANA 16ML   Culture NO GROWTH 2 DAYS  Final   Report Status PENDING  Incomplete  Blood culture (routine x 2)     Status: None (Preliminary result)   Collection Time: 02/26/16  3:31 PM  Result Value Ref Range Status   Specimen Description BLOOD  L AC  Final   Special Requests   Final    BOTTLES DRAWN AEROBIC AND ANAEROBIC  ANA 16 AER 15ML   Culture NO GROWTH 2 DAYS  Final   Report Status PENDING  Incomplete    RADIOLOGY:  Dg Chest 2 View  Result Date: 02/26/2016 CLINICAL DATA:  SOB, Dyspnea that started this AM. Patient states that it is an asthma attack. Hx: Non-smoker. COPD, CHF, HTN, and is diabetic when on prednisone according to patient's husband. EXAM: CHEST  2 VIEW COMPARISON:  02/21/2016 FINDINGS: Airway thickening is present, suggesting bronchitis or reactive airways disease. Accentuated lingular opacity favoring atelectasis or pneumonia over epicardial adipose tissue given the prominence. Reverse lordotic projection complicates this assessment. The lungs appear otherwise clear. IMPRESSION: 1. Airway thickening is present, suggesting bronchitis or reactive airways disease. 2. Difficult to exclude airspace opacity along the lingula. The projection is reverse lordotic on the frontal view, which might tend to exaggerate the epicardial adipose tissue. Correlate with anterior breath sounds along the left lung base. Electronically Signed   By: Van Clines M.D.   On: 02/26/2016 14:14     Management plans discussed with the patient, family and they are in agreement.  CODE STATUS:  Code Status Orders        Start     Ordered   02/26/16 1841   Full code  Continuous     02/26/16 1841    Code Status History    Date Active Date Inactive Code Status Order ID Comments User Context   02/21/2016 10:52 AM 02/22/2016  2:11 PM Full Code [id number]  Max Sane, MD Inpatient      TOTAL TIME TAKING CARE OF THIS PATIENT: 33 minutes.    Zakery Normington,  Karenann Cai.D on 02/28/2016 at 11:41 AM  Between 7am to 6pm - Pager - [phone]  After 6pm go to [email or web address] - [employer] [facility]  Office  [phone]  CC: Primary care physician; Lavera Guise, MD

## 2016-03-02 LAB — CULTURE, BLOOD (ROUTINE X 2)
CULTURE: NO GROWTH
Culture: NO GROWTH

## 2016-03-12 DIAGNOSIS — R062 Wheezing: Secondary | ICD-10-CM | POA: Diagnosis not present

## 2016-03-12 DIAGNOSIS — I10 Essential (primary) hypertension: Secondary | ICD-10-CM | POA: Diagnosis not present

## 2016-03-12 DIAGNOSIS — I5042 Chronic combined systolic (congestive) and diastolic (congestive) heart failure: Secondary | ICD-10-CM | POA: Diagnosis not present

## 2016-03-12 DIAGNOSIS — R0602 Shortness of breath: Secondary | ICD-10-CM | POA: Diagnosis not present

## 2016-03-12 DIAGNOSIS — R55 Syncope and collapse: Secondary | ICD-10-CM | POA: Diagnosis not present

## 2016-03-12 DIAGNOSIS — E039 Hypothyroidism, unspecified: Secondary | ICD-10-CM | POA: Diagnosis not present

## 2016-03-12 DIAGNOSIS — E1165 Type 2 diabetes mellitus with hyperglycemia: Secondary | ICD-10-CM | POA: Diagnosis not present

## 2016-03-12 DIAGNOSIS — D509 Iron deficiency anemia, unspecified: Secondary | ICD-10-CM | POA: Diagnosis not present

## 2016-03-12 DIAGNOSIS — R079 Chest pain, unspecified: Secondary | ICD-10-CM | POA: Diagnosis not present

## 2016-03-12 DIAGNOSIS — E876 Hypokalemia: Secondary | ICD-10-CM | POA: Diagnosis not present

## 2016-03-13 DIAGNOSIS — R0602 Shortness of breath: Secondary | ICD-10-CM | POA: Diagnosis not present

## 2016-03-20 DIAGNOSIS — R55 Syncope and collapse: Secondary | ICD-10-CM | POA: Diagnosis not present

## 2016-03-21 ENCOUNTER — Other Ambulatory Visit: Payer: Self-pay | Admitting: Nurse Practitioner

## 2016-03-21 DIAGNOSIS — R0602 Shortness of breath: Secondary | ICD-10-CM

## 2016-04-01 ENCOUNTER — Other Ambulatory Visit: Payer: Self-pay | Admitting: Nurse Practitioner

## 2016-04-01 DIAGNOSIS — R079 Chest pain, unspecified: Secondary | ICD-10-CM

## 2016-04-01 DIAGNOSIS — R0602 Shortness of breath: Secondary | ICD-10-CM

## 2016-04-03 ENCOUNTER — Ambulatory Visit
Admission: RE | Admit: 2016-04-03 | Discharge: 2016-04-03 | Disposition: A | Discharge status: Home / Self Care | Payer: Medicare HMO | Source: Ambulatory Visit | Attending: Nurse Practitioner | Admitting: Nurse Practitioner

## 2016-04-03 DIAGNOSIS — R0602 Shortness of breath: Secondary | ICD-10-CM

## 2016-04-03 DIAGNOSIS — R918 Other nonspecific abnormal finding of lung field: Secondary | ICD-10-CM | POA: Insufficient documentation

## 2016-04-03 DIAGNOSIS — R079 Chest pain, unspecified: Secondary | ICD-10-CM | POA: Diagnosis not present

## 2016-04-03 DIAGNOSIS — I2510 Atherosclerotic heart disease of native coronary artery without angina pectoris: Secondary | ICD-10-CM | POA: Diagnosis not present

## 2016-04-03 MED ORDER — IOPAMIDOL (ISOVUE-300) INJECTION 61%
75.0000 mL | Freq: Once | INTRAVENOUS | Status: AC | PRN
  Administered 2016-04-03: 75 mL via INTRAVENOUS

## 2016-04-04 DIAGNOSIS — J4550 Severe persistent asthma, uncomplicated: Secondary | ICD-10-CM | POA: Diagnosis not present

## 2016-04-04 DIAGNOSIS — J181 Lobar pneumonia, unspecified organism: Secondary | ICD-10-CM | POA: Diagnosis not present

## 2016-04-04 DIAGNOSIS — R0602 Shortness of breath: Secondary | ICD-10-CM | POA: Diagnosis not present

## 2016-04-04 DIAGNOSIS — K210 Gastro-esophageal reflux disease with esophagitis: Secondary | ICD-10-CM | POA: Diagnosis not present

## 2016-04-12 ENCOUNTER — Other Ambulatory Visit: Payer: Self-pay | Admitting: Internal Medicine

## 2016-04-12 DIAGNOSIS — J181 Lobar pneumonia, unspecified organism: Secondary | ICD-10-CM

## 2016-04-18 DIAGNOSIS — I5042 Chronic combined systolic (congestive) and diastolic (congestive) heart failure: Secondary | ICD-10-CM | POA: Diagnosis not present

## 2016-04-18 DIAGNOSIS — J181 Lobar pneumonia, unspecified organism: Secondary | ICD-10-CM | POA: Diagnosis not present

## 2016-04-18 DIAGNOSIS — Z7952 Long term (current) use of systemic steroids: Secondary | ICD-10-CM | POA: Diagnosis not present

## 2016-04-18 DIAGNOSIS — E1165 Type 2 diabetes mellitus with hyperglycemia: Secondary | ICD-10-CM | POA: Diagnosis not present

## 2016-04-19 ENCOUNTER — Ambulatory Visit
Admission: RE | Admit: 2016-04-19 | Discharge: 2016-04-19 | Disposition: A | Discharge status: Home / Self Care | Payer: Medicare HMO | Source: Ambulatory Visit | Attending: Internal Medicine | Admitting: Internal Medicine

## 2016-04-19 DIAGNOSIS — J9811 Atelectasis: Secondary | ICD-10-CM | POA: Insufficient documentation

## 2016-04-19 DIAGNOSIS — J188 Other pneumonia, unspecified organism: Secondary | ICD-10-CM | POA: Insufficient documentation

## 2016-04-19 DIAGNOSIS — Z8701 Personal history of pneumonia (recurrent): Secondary | ICD-10-CM | POA: Diagnosis not present

## 2016-04-19 DIAGNOSIS — E119 Type 2 diabetes mellitus without complications: Secondary | ICD-10-CM | POA: Diagnosis not present

## 2016-04-19 DIAGNOSIS — Z881 Allergy status to other antibiotic agents status: Secondary | ICD-10-CM | POA: Diagnosis not present

## 2016-04-19 DIAGNOSIS — J4551 Severe persistent asthma with (acute) exacerbation: Secondary | ICD-10-CM | POA: Diagnosis not present

## 2016-04-19 DIAGNOSIS — R0602 Shortness of breath: Secondary | ICD-10-CM | POA: Diagnosis not present

## 2016-04-19 DIAGNOSIS — J449 Chronic obstructive pulmonary disease, unspecified: Secondary | ICD-10-CM | POA: Diagnosis present

## 2016-04-19 DIAGNOSIS — K449 Diaphragmatic hernia without obstruction or gangrene: Secondary | ICD-10-CM

## 2016-04-19 DIAGNOSIS — Z91018 Allergy to other foods: Secondary | ICD-10-CM | POA: Diagnosis not present

## 2016-04-19 DIAGNOSIS — Z7982 Long term (current) use of aspirin: Secondary | ICD-10-CM | POA: Diagnosis not present

## 2016-04-19 DIAGNOSIS — Z09 Encounter for follow-up examination after completed treatment for conditions other than malignant neoplasm: Secondary | ICD-10-CM | POA: Insufficient documentation

## 2016-04-19 DIAGNOSIS — Z91013 Allergy to seafood: Secondary | ICD-10-CM | POA: Diagnosis not present

## 2016-04-19 DIAGNOSIS — J101 Influenza due to other identified influenza virus with other respiratory manifestations: Secondary | ICD-10-CM | POA: Diagnosis not present

## 2016-04-19 DIAGNOSIS — R402412 Glasgow coma scale score 13-15, at arrival to emergency department: Secondary | ICD-10-CM | POA: Diagnosis present

## 2016-04-19 DIAGNOSIS — Z8249 Family history of ischemic heart disease and other diseases of the circulatory system: Secondary | ICD-10-CM | POA: Diagnosis not present

## 2016-04-19 DIAGNOSIS — Z7951 Long term (current) use of inhaled steroids: Secondary | ICD-10-CM | POA: Diagnosis not present

## 2016-04-19 DIAGNOSIS — I2510 Atherosclerotic heart disease of native coronary artery without angina pectoris: Secondary | ICD-10-CM | POA: Insufficient documentation

## 2016-04-19 DIAGNOSIS — Z23 Encounter for immunization: Secondary | ICD-10-CM | POA: Diagnosis not present

## 2016-04-19 DIAGNOSIS — Z794 Long term (current) use of insulin: Secondary | ICD-10-CM | POA: Diagnosis not present

## 2016-04-19 DIAGNOSIS — J181 Lobar pneumonia, unspecified organism: Secondary | ICD-10-CM

## 2016-04-19 DIAGNOSIS — R918 Other nonspecific abnormal finding of lung field: Secondary | ICD-10-CM

## 2016-04-19 DIAGNOSIS — K219 Gastro-esophageal reflux disease without esophagitis: Secondary | ICD-10-CM | POA: Diagnosis not present

## 2016-04-19 DIAGNOSIS — Z880 Allergy status to penicillin: Secondary | ICD-10-CM | POA: Diagnosis not present

## 2016-04-19 DIAGNOSIS — I10 Essential (primary) hypertension: Secondary | ICD-10-CM | POA: Diagnosis not present

## 2016-04-19 DIAGNOSIS — R05 Cough: Secondary | ICD-10-CM | POA: Diagnosis not present

## 2016-04-19 DIAGNOSIS — J45901 Unspecified asthma with (acute) exacerbation: Secondary | ICD-10-CM | POA: Diagnosis not present

## 2016-04-19 DIAGNOSIS — J189 Pneumonia, unspecified organism: Secondary | ICD-10-CM | POA: Diagnosis not present

## 2016-04-21 ENCOUNTER — Emergency Department: Payer: Medicare HMO

## 2016-04-21 ENCOUNTER — Encounter: Payer: Self-pay | Admitting: Internal Medicine

## 2016-04-21 ENCOUNTER — Inpatient Hospital Stay
Admission: EM | Admit: 2016-04-21 | Discharge: 2016-04-25 | DRG: 202 | Disposition: A | Discharge status: Home / Self Care | Payer: Medicare HMO | Attending: Internal Medicine | Admitting: Internal Medicine

## 2016-04-21 DIAGNOSIS — Z7982 Long term (current) use of aspirin: Secondary | ICD-10-CM

## 2016-04-21 DIAGNOSIS — J9811 Atelectasis: Secondary | ICD-10-CM | POA: Diagnosis present

## 2016-04-21 DIAGNOSIS — K219 Gastro-esophageal reflux disease without esophagitis: Secondary | ICD-10-CM | POA: Diagnosis present

## 2016-04-21 DIAGNOSIS — J181 Lobar pneumonia, unspecified organism: Secondary | ICD-10-CM

## 2016-04-21 DIAGNOSIS — R05 Cough: Secondary | ICD-10-CM | POA: Diagnosis not present

## 2016-04-21 DIAGNOSIS — R0602 Shortness of breath: Secondary | ICD-10-CM | POA: Diagnosis present

## 2016-04-21 DIAGNOSIS — R0600 Dyspnea, unspecified: Secondary | ICD-10-CM

## 2016-04-21 DIAGNOSIS — I10 Essential (primary) hypertension: Secondary | ICD-10-CM | POA: Diagnosis present

## 2016-04-21 DIAGNOSIS — Z91018 Allergy to other foods: Secondary | ICD-10-CM | POA: Diagnosis not present

## 2016-04-21 DIAGNOSIS — K449 Diaphragmatic hernia without obstruction or gangrene: Secondary | ICD-10-CM | POA: Diagnosis present

## 2016-04-21 DIAGNOSIS — R402412 Glasgow coma scale score 13-15, at arrival to emergency department: Secondary | ICD-10-CM | POA: Diagnosis present

## 2016-04-21 DIAGNOSIS — Z91013 Allergy to seafood: Secondary | ICD-10-CM

## 2016-04-21 DIAGNOSIS — Z880 Allergy status to penicillin: Secondary | ICD-10-CM

## 2016-04-21 DIAGNOSIS — Z23 Encounter for immunization: Secondary | ICD-10-CM | POA: Diagnosis present

## 2016-04-21 DIAGNOSIS — J189 Pneumonia, unspecified organism: Secondary | ICD-10-CM | POA: Diagnosis not present

## 2016-04-21 DIAGNOSIS — Z8701 Personal history of pneumonia (recurrent): Secondary | ICD-10-CM

## 2016-04-21 DIAGNOSIS — E119 Type 2 diabetes mellitus without complications: Secondary | ICD-10-CM

## 2016-04-21 DIAGNOSIS — Z7951 Long term (current) use of inhaled steroids: Secondary | ICD-10-CM

## 2016-04-21 DIAGNOSIS — Z09 Encounter for follow-up examination after completed treatment for conditions other than malignant neoplasm: Secondary | ICD-10-CM | POA: Diagnosis not present

## 2016-04-21 DIAGNOSIS — Z881 Allergy status to other antibiotic agents status: Secondary | ICD-10-CM

## 2016-04-21 DIAGNOSIS — J101 Influenza due to other identified influenza virus with other respiratory manifestations: Secondary | ICD-10-CM | POA: Diagnosis present

## 2016-04-21 DIAGNOSIS — J188 Other pneumonia, unspecified organism: Secondary | ICD-10-CM | POA: Diagnosis present

## 2016-04-21 DIAGNOSIS — J4551 Severe persistent asthma with (acute) exacerbation: Secondary | ICD-10-CM | POA: Diagnosis present

## 2016-04-21 DIAGNOSIS — J45901 Unspecified asthma with (acute) exacerbation: Secondary | ICD-10-CM | POA: Diagnosis not present

## 2016-04-21 DIAGNOSIS — J449 Chronic obstructive pulmonary disease, unspecified: Secondary | ICD-10-CM | POA: Diagnosis present

## 2016-04-21 DIAGNOSIS — I2510 Atherosclerotic heart disease of native coronary artery without angina pectoris: Secondary | ICD-10-CM | POA: Diagnosis present

## 2016-04-21 DIAGNOSIS — Z8249 Family history of ischemic heart disease and other diseases of the circulatory system: Secondary | ICD-10-CM | POA: Diagnosis not present

## 2016-04-21 DIAGNOSIS — Z794 Long term (current) use of insulin: Secondary | ICD-10-CM

## 2016-04-21 HISTORY — DX: Type 2 diabetes mellitus without complications: E11.9

## 2016-04-21 HISTORY — DX: Gastro-esophageal reflux disease without esophagitis: K21.9

## 2016-04-21 LAB — BASIC METABOLIC PANEL
Anion gap: 10 (ref 5–15)
BUN: 13 mg/dL (ref 6–20)
CHLORIDE: 103 mmol/L (ref 101–111)
CO2: 28 mmol/L (ref 22–32)
Calcium: 9.1 mg/dL (ref 8.9–10.3)
Creatinine, Ser: 0.75 mg/dL (ref 0.44–1.00)
GFR calc non Af Amer: >60 mL/min (ref >60)
Glucose, Bld: 156 mg/dL — ABNORMAL HIGH (ref 65–99)
POTASSIUM: 3.9 mmol/L (ref 3.5–5.1)
SODIUM: 141 mmol/L (ref 135–145)

## 2016-04-21 LAB — CBC
HEMATOCRIT: 41.6 % (ref 35.0–47.0)
Hemoglobin: 14.5 g/dL (ref 12.0–16.0)
MCH: 31.6 pg (ref 26.0–34.0)
MCHC: 34.9 g/dL (ref 32.0–36.0)
MCV: 90.6 fL (ref 80.0–100.0)
Platelets: 250 10*3/uL (ref 150–440)
RBC: 4.59 MIL/uL (ref 3.80–5.20)
RDW: 14 % (ref 11.5–14.5)
WBC: 9.6 10*3/uL (ref 3.6–11.0)

## 2016-04-21 MED ORDER — DEXTROSE 5 % IV SOLN
500.0000 mg | Freq: Once | INTRAVENOUS | Status: DC
  Administered 2016-04-22: 500 mg via INTRAVENOUS
  Filled 2016-04-21: qty 500

## 2016-04-21 MED ORDER — METHYLPREDNISOLONE SODIUM SUCC 125 MG IJ SOLR
125.0000 mg | Freq: Once | INTRAMUSCULAR | Status: AC
  Administered 2016-04-21: 125 mg via INTRAVENOUS

## 2016-04-21 MED ORDER — METHYLPREDNISOLONE SODIUM SUCC 125 MG IJ SOLR
INTRAMUSCULAR | Status: AC
  Administered 2016-04-21: 125 mg via INTRAVENOUS
  Filled 2016-04-21: qty 2

## 2016-04-21 MED ORDER — ALBUTEROL SULFATE (2.5 MG/3ML) 0.083% IN NEBU
5.0000 mg | INHALATION_SOLUTION | Freq: Once | RESPIRATORY_TRACT | Status: AC
  Administered 2016-04-21: 5 mg via RESPIRATORY_TRACT
  Filled 2016-04-21: qty 6

## 2016-04-21 MED ORDER — IPRATROPIUM-ALBUTEROL 0.5-2.5 (3) MG/3ML IN SOLN
3.0000 mL | Freq: Once | RESPIRATORY_TRACT | Status: AC
  Administered 2016-04-21: 3 mL via RESPIRATORY_TRACT
  Filled 2016-04-21: qty 3

## 2016-04-21 NOTE — ED Provider Notes (Signed)
Time Seen: Approximately 2232  I have reviewed the triage notes  Chief Complaint: Cough; Fever; and Shortness of Breath   History of Present Illness: ELLISHA Ross is a 57 y.o. 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 Patient recently saw her pulmonologist and had a chest CT ordered as an outpatient after she had some increasing shortness of breath starting on Thursday. Chest CT shows a persistent lingular infiltrate that seemed to be present with previous CAT scan. The patient states she's developed a low-grade fever and some body aches and states her shortness of breath is Worse throughout the day. Gave herself a breathing treatment prior to arrival and is also had prednisone 60 mg at home. She has prescriptions for Zithromax and doxycycline with advised to start if she gets symptomatic and she had artery taken one dosage of the Zithromax. Patient denies any current pulmonary emboli risk factors. Review of the chest CT shows no mention of pulmonary emboli Past Medical History:  Diagnosis Date  . Asthma   . CHF (congestive heart failure) ([place])    1991- unknown after asthma attack  . COPD (chronic obstructive pulmonary disease) ([place])   . Diabetes ([place])   . GERD (gastroesophageal reflux disease)   . Hypertension   . Hypertension     Patient Active Problem List   Diagnosis Date Noted  . HTN (hypertension) 04/21/2016  . Diabetes ([place]) 04/21/2016  . GERD (gastroesophageal reflux disease) 04/21/2016  . Pneumonia 02/26/2016  . Asthma exacerbation 02/21/2016    Past Surgical History:  Procedure Laterality Date  . CESAREAN SECTION    . CHOLECYSTECTOMY    . ESOPHAGEAL DILATION    . HERNIA REPAIR    . NASAL SINUS SURGERY      Past Surgical History:  Procedure Laterality Date  . CESAREAN SECTION    . CHOLECYSTECTOMY    . ESOPHAGEAL DILATION    . HERNIA REPAIR    . NASAL SINUS SURGERY      Current Outpatient Rx  . Order  #: [id number] Class: Historical Med  . Order #: [id number] Class: Historical Med  . Order #: [id number] Class: Historical Med  . Order #: [id number] Class: Historical Med  . Order #: [id number] Class: Historical Med  . Order #: [id number] Class: Historical Med  . Order #: [id number] Class: Historical Med  . Order #: [id number] Class: Historical Med  . Order #: [id number] Class: Normal  . Order #: [id number] Class: Historical Med  . Order #: [id number] Class: Historical Med  . Order #: [id number] Class: Historical Med  . Order #: [id number] Class: Historical Med  . Order #: [id number] Class: Historical Med  . Order #: [id number] Class: Historical Med  . Order #: [id number] Class: Normal  . Order #: [id number] Class: Historical Med  . Order #: [id number] Class: Historical Med    Allergies:  Penicillins; Shellfish allergy; Sunflower oil; and Levaquin [levofloxacin in d5w]  Family History: Family History  Problem Relation Age of Onset  . Hypertension Mother     Social History: Social History  Substance Use Topics  . Smoking status: Never Smoker  . Smokeless tobacco: Never Used  . Alcohol use No     Review of Systems:   10 point review of systems was performed and was otherwise negative:  Constitutional: Subjective low-grade fever at home Eyes: No visual disturbances ENT: No sore throat, ear pain Cardiac: No chest pain Respiratory: Increasing shortness of breath, dry nonproductive cough Abdomen: No abdominal pain, no vomiting, No diarrhea Endocrine: No weight loss, No night sweats Extremities: No  peripheral edema, cyanosis Skin: No rashes, easy bruising Neurologic: No focal weakness, trouble with speech or swollowing Urologic: No dysuria, Hematuria, or urinary frequency   Physical Exam:  ED Triage Vitals  Enc Vitals Group     BP 04/21/16 2145 (!) 155/80     Pulse Rate 04/21/16 2145 (!) 131     Resp 04/21/16 2145 (!) 50     Temp 04/21/16 2145 99.6 F (37.6 C)     Temp Source 04/21/16 2145 Oral      SpO2 04/21/16 2145 92 %     Weight 04/21/16 2145 205 lb (93 kg)     Height 04/21/16 2145 5\' 3"  (1.6 m)     Head Circumference --      Peak Flow --      Pain Score 04/21/16 2146 4     Pain Loc --      Pain Edu? --      Excl. in [place]? --     General: Awake , Alert , and Oriented times 3; GCS 15 Patient speaks in brief and interrupted sentences and arrives with a respiratory rate of 50 Head: Normal cephalic , atraumatic Eyes: Pupils equal , round, reactive to light Nose/Throat: No nasal drainage, patent upper airway without erythema or exudate.  Neck: Supple, Full range of motion, No anterior adenopathy or palpable thyroid masses Lungs: Coarse breath sounds bilaterally at the bases with some end expiratory wheezing heard primarily at the apices  Heart: Regular rate, regular rhythm without murmurs , gallops , or rubs Abdomen: Soft, non tender without rebound, guarding , or rigidity; bowel sounds positive and symmetric in all 4 quadrants. No organomegaly .        Extremities: 2 plus symmetric pulses. No edema, clubbing or cyanosis Neurologic: normal ambulation, Motor symmetric without deficits, sensory intact Skin: warm, dry, no rashes   Labs:   All laboratory work was reviewed including any pertinent negatives or positives listed below:  Labs Reviewed  BASIC METABOLIC PANEL - Abnormal; Notable for the following:       Result Value   Glucose, Bld 156 (*)    All other components within normal limits  CBC  INFLUENZA PANEL BY PCR (TYPE A & B)    EKG: ED ECG REPORT I, Daymon Larsen, the attending physician, personally viewed and interpreted this ECG.  Date: 04/21/2016 EKG Time: 2141 Rate: 135Rhythm: Sinus tachycardia QRS Axis: normal Intervals: normal ST/T Wave abnormalities: normal Conduction Disturbances: none Narrative Interpretation: unremarkable No acute ischemic changes   Radiology: "Dg Chest 2 View  Result Date: 04/21/2016 CLINICAL DATA:  56 y/o  F; shortness  of breath. EXAM: CHEST  2 VIEW COMPARISON:  05/17/2016 chest CT.  02/26/2016 chest radiograph. FINDINGS: Stable heart size and mediastinal contours are within normal limits. Persistent stable opacity within the lingula in comparison with prior radiographs. No new focal consolidation of the lungs. The visualized skeletal structures are unremarkable. IMPRESSION: Persistent stable opacity within the lingula. No new focal consolidation of the lungs. Electronically Signed   By: Kristine Garbe M.D.   On: 04/21/2016 22:37   Ct Chest Wo Contrast  Result Date: 04/19/2016 CLINICAL DATA:  Recent pneumonia.  New chest pain EXAM: CT CHEST WITHOUT CONTRAST TECHNIQUE: Multidetector CT imaging of the chest was performed following the standard protocol without IV contrast. COMPARISON:  April 03, 2016 FINDINGS: Cardiovascular: There is no evident thoracic aortic aneurysm. There is slight calcification in the proximal visualized great vessels. Visualized great vessels otherwise appear unremarkable  on this noncontrast enhanced study. There is mild calcification in the aortic arch region. There are foci of coronary artery calcification. Pericardium is not appreciably thickened. Mediastinum/Nodes: Thyroid appears unremarkable. There is no evident thoracic adenopathy. There is a fairly small focal hiatal hernia. Lungs/Pleura: There remains focal airspace consolidation in the inferior lingula, somewhat less than on the previous study. Mild atelectasis is noted in the medial segment of the right middle lobe, very little changed. There is no new consolidation on either side. There is no pleural effusion or pleural thickening. Upper Abdomen: Gallbladder absent. Visualized upper abdominal structures otherwise unremarkable. Musculoskeletal: No blastic or lytic bone lesions. No evident chest wall lesion. IMPRESSION: Patchy airspace opacity in the inferior lingula with a degree of partial clearing compared to the previous study.  This area opacity currently measures 1.8 x 1.2 cm compared to 2.9 x 1.5 cm on most recent prior study. Atelectasis is again noted in the medial aspect of the right middle lobe adjacent to the inferior anterior mediastinum. No new opacities are identified. No evident adenopathy. Focal hiatal hernia. Foci of coronary artery calcification again noted. Mild atherosclerotic calcification in proximal great vessels and aortic arch region. Gallbladder absent. Electronically Signed   By: Lowella Grip III M.D.   On: 04/19/2016 14:39   Ct Chest W Contrast  Result Date: 04/03/2016 CLINICAL DATA:  Shortness of breath. EXAM: CT CHEST WITH CONTRAST TECHNIQUE: Multidetector CT imaging of the chest was performed during intravenous contrast administration. CONTRAST:  69mL ISOVUE-300 IOPAMIDOL (ISOVUE-300) INJECTION 61% COMPARISON:  Radiograph February 26, 2016.  CT scan of September 09, 2011. FINDINGS: Cardiovascular: No evidence of thoracic aortic aneurysm. Normal cardiac size. No pericardial effusion. Coronary artery calcifications are noted. Mediastinum/Nodes: No enlarged mediastinal, hilar, or axillary lymph nodes. Thyroid gland, trachea, and esophagus demonstrate no significant findings. Lungs/Pleura: No pneumothorax or pleural effusion is noted. Right lung is clear. 3.0 x 1.5 cm irregular density is seen in the inferior portion of lingular segment of left upper lobe most consistent with atelectasis or infiltrate. Upper Abdomen: No acute abnormality. Musculoskeletal: No chest wall abnormality. No acute or significant osseous findings. IMPRESSION: Coronary artery calcifications are noted suggesting coronary artery disease. Irregular density seen in inferior portion of lingular segment of left upper lobe most consistent with subsegmental atelectasis or possibly pneumonia, but follow-up unenhanced chest CT scan in 10-14 days is recommended to ensure resolution and rule out underlying neoplasm or malignancy. Electronically Signed    By: Marijo Conception, M.D.   On: 04/03/2016 09:04  "  I personally reviewed the radiologic studies    ED Course:  Patient was started on IV antibiotics after she presented with a low grade fever and possible infiltrate that seems to be relatively consistent based on previous CAT scan in the past and also present. Since she had a fever we felt it was pertinent to start her on IV antibiotics she had been on one single dose of Zithromax at home. The patient's flu study is still pending at this time. She was given a boost of her steroids after she had taken her normal prednisone 60 mg at home. She was given a DuoNeb here with some mild symptomatic improvement.     Assessment:  Acute exacerbation of chronic asthma Possible pneumonia   Final Clinical Impression:  Final diagnoses:  Community acquired pneumonia of left lower lobe of lung [facility])     Plan:  Inpatient            Aaron Edelman  Theodosia Paling, MD 04/22/16 0001

## 2016-04-21 NOTE — ED Notes (Signed)
Pt c/o flu-like symptoms for several days; says she had a chest CT on Friday to evaluate for pneumonia; pt does not have results;

## 2016-04-21 NOTE — ED Triage Notes (Signed)
Pt with hx of COPD and asthma. Has been been followed by her PCP and pulmonologist for increasing episodes of worsening breathing. Pt had CT on 04/03/16 and again this past week. Pt has hx of being intubated. Pt very wheezy in triage. Dry,  hacking cough noted

## 2016-04-21 NOTE — H&P (Signed)
[facility] [place] NAME: Cassie Ross    MR#:  [id number]  DATE OF BIRTH:  1960/04/22  DATE OF ADMISSION:  04/21/2016  PRIMARY CARE PHYSICIAN: Lavera Guise, MD   REQUESTING/REFERRING PHYSICIAN: Marcelene Butte, MD  CHIEF COMPLAINT:   Chief Complaint  Patient presents with  . Cough  . Fever  . Shortness of Breath    HISTORY OF PRESENT ILLNESS:  Cassie Ross  is a 56 y.o. female who presents with 2 days of cough and progressive shortness of breath. Patient has history of brittle asthma, and follows with her pulmonologist Dr. Humphrey Rolls. She is recently treated for suspicion of some pneumonia. She states she's been having some fever for some time along with these symptoms. However, the symptoms she complains of today just started a little more than 24 hours ago. Here in the ED she was found to be influenza A positive. Chest x-ray still shows some opacity in the lingula concerning for pneumonia. Hospitalists were called for admission  PAST MEDICAL HISTORY:   Past Medical History:  Diagnosis Date  . Asthma   . CHF (congestive heart failure) ([place])    1991- unknown after asthma attack  . COPD (chronic obstructive pulmonary disease) ([place])   . Diabetes ([place])   . GERD (gastroesophageal reflux disease)   . Hypertension   . Hypertension     PAST SURGICAL HISTORY:   Past Surgical History:  Procedure Laterality Date  . CESAREAN SECTION    . CHOLECYSTECTOMY    . ESOPHAGEAL DILATION    . HERNIA REPAIR    . NASAL SINUS SURGERY      SOCIAL HISTORY:   Social History  Substance Use Topics  . Smoking status: Never Smoker  . Smokeless tobacco: Never Used  . Alcohol use No    FAMILY HISTORY:   Family History  Problem Relation Age of Onset  . Hypertension Mother     DRUG ALLERGIES:   Allergies  Allergen Reactions  . Penicillins     Has patient had a PCN reaction causing immediate rash, facial/tongue/throat swelling, SOB or  lightheadedness with hypotension: no Has patient had a PCN reaction causing severe rash involving mucus membranes or skin necrosis: no Has patient had a PCN reaction that required hospitalization no Has patient had a PCN reaction occurring within the last 10 years: no If all of the above answers are "NO", then may proceed with Cephalosporin use.   . Shellfish Allergy Swelling  . Sunflower Oil Swelling    seeds  . Levaquin [Levofloxacin In D5w] Rash    Pill form    MEDICATIONS AT HOME:   Prior to Admission medications   Medication Sig Start Date End Date Taking? Authorizing Provider  albuterol (PROVENTIL HFA;VENTOLIN HFA) 108 (90 Base) MCG/ACT inhaler Inhale 1 puff into the lungs as needed for wheezing or shortness of breath.    Historical Provider, MD  ALPRAZolam Duanne Moron) 0.5 MG tablet Take 0.5-1 mg by mouth 2 (two) times daily.    Historical Provider, MD  aspirin EC 81 MG tablet Take 81 mg by mouth daily.    Historical Provider, MD  atorvastatin (LIPITOR) 10 MG tablet Take 10 mg by mouth daily.    Historical Provider, MD  budesonide-formoterol (SYMBICORT) 160-4.5 MCG/ACT inhaler Inhale 1 puff into the lungs 2 (two) times daily.    Historical Provider, MD  Calcium Citrate-Vitamin D (CALCIUM + D PO) Take 2 tablets by mouth 2 (two) times daily.  Historical Provider, MD  diltiazem (CARDIZEM CD) 180 MG 24 hr capsule Take 180 mg by mouth 2 (two) times daily.    Historical Provider, MD  furosemide (LASIX) 80 MG tablet Take 80 mg by mouth 2 (two) times daily.    Historical Provider, MD  guaiFENesin (MUCINEX) 600 MG 12 hr tablet Take 1 tablet (600 mg total) by mouth 2 (two) times daily as needed for cough or to loosen phlegm. 02/28/16   Lytle Butte, MD  insulin lispro (HUMALOG) 100 UNIT/ML injection Inject 2-10 Units into the skin 3 (three) times daily before meals. Based on sliding scale. For BS of 150-200, take 2 units, 201-250, take 4 units, 251-300, take 6 units, 301-350 take 8 units,  351-400 take 10 units. Over 400 contact physician.    Historical Provider, MD  loratadine (CLARITIN) 10 MG tablet Take 10 mg by mouth daily.    Historical Provider, MD  montelukast (SINGULAIR) 10 MG tablet Take 10 mg by mouth daily.    Historical Provider, MD  Multiple Vitamins-Minerals (ONE-A-DAY MENOPAUSE FORMULA PO) Take 1 tablet by mouth daily.    Historical Provider, MD  pantoprazole (PROTONIX) 40 MG tablet Take 40 mg by mouth daily.    Historical Provider, MD  potassium chloride (K-DUR,KLOR-CON) 10 MEQ tablet Take 10 mEq by mouth 2 (two) times daily.    Historical Provider, MD  predniSONE (DELTASONE) 10 MG tablet 40mg  x1day, 20mg x2 day, 10mg x2 day then stop 02/28/16   Lytle Butte, MD  sucralfate (CARAFATE) 1 g tablet Take 1 g by mouth 4 (four) times daily.    Historical Provider, MD  theophylline (THEODUR) 300 MG 12 hr tablet Take 300 mg by mouth 2 (two) times daily.    Historical Provider, MD    REVIEW OF SYSTEMS:  Review of Systems  Constitutional: Positive for fever and malaise/fatigue. Negative for chills and weight loss.  HENT: Negative for ear pain, hearing loss and tinnitus.   Eyes: Negative for blurred vision, double vision, pain and redness.  Respiratory: Positive for cough, shortness of breath and wheezing. Negative for hemoptysis.   Cardiovascular: Negative for chest pain, palpitations, orthopnea and leg swelling.  Gastrointestinal: Positive for nausea. Negative for abdominal pain, constipation, diarrhea and vomiting.  Genitourinary: Negative for dysuria, frequency and hematuria.  Musculoskeletal: Negative for back pain, joint pain and neck pain.  Skin:       No acne, rash, or lesions  Neurological: Negative for dizziness, tremors, focal weakness and weakness.  Endo/Heme/Allergies: Negative for polydipsia. Does not bruise/bleed easily.  Psychiatric/Behavioral: Negative for depression. The patient is not nervous/anxious and does not have insomnia.      VITAL SIGNS:    Vitals:   04/21/16 2145 04/21/16 2230  BP: (!) 155/80 (!) 142/91  Pulse: (!) 131 (!) 128  Resp: (!) 50 (!) 24  Temp: 99.6 F (37.6 C)   TempSrc: Oral   SpO2: 92% 96%  Weight: 93 kg (205 lb)   Height: 5\' 3"  (1.6 m)    Wt Readings from Last 3 Encounters:  04/21/16 93 kg (205 lb)  02/26/16 90.7 kg (200 lb)  02/21/16 92.5 kg (204 lb)    PHYSICAL EXAMINATION:  Physical Exam  Vitals reviewed. Constitutional: She is oriented to person, place, and time. She appears well-developed and well-nourished. No distress.  HENT:  Head: Normocephalic and atraumatic.  Mouth/Throat: Oropharynx is clear and moist.  Eyes: Conjunctivae and EOM are normal. Pupils are equal, round, and reactive to light. No scleral icterus.  Neck:  Normal range of motion. Neck supple. No JVD present. No thyromegaly present.  Cardiovascular: Normal rate, regular rhythm and intact distal pulses.  Exam reveals no gallop and no friction rub.   No murmur heard. tachycardic  Respiratory: She is in respiratory distress (mild). She has wheezes. She has no rales.  Coarse sounds right midlung field  GI: Soft. Bowel sounds are normal. She exhibits no distension. There is no tenderness.  Musculoskeletal: Normal range of motion. She exhibits no edema.  No arthritis, no gout  Lymphadenopathy:    She has no cervical adenopathy.  Neurological: She is alert and oriented to person, place, and time. No cranial nerve deficit.  No dysarthria, no aphasia  Skin: Skin is warm and dry. No rash noted. No erythema.  Psychiatric: She has a normal mood and affect. Her behavior is normal. Judgment and thought content normal.    LABORATORY PANEL:   CBC  Recent Labs Lab 04/21/16 2209  WBC 9.6  HGB 14.5  HCT 41.6  PLT 250   ------------------------------------------------------------------------------------------------------------------  Chemistries   Recent Labs Lab 04/21/16 2306  NA 141  K 3.9  CL 103  CO2 28  GLUCOSE  156*  BUN 13  CREATININE 0.75  CALCIUM 9.1   ------------------------------------------------------------------------------------------------------------------  Cardiac Enzymes No results for input(s): TROPONINI in the last 168 hours. ------------------------------------------------------------------------------------------------------------------  RADIOLOGY:  Dg Chest 2 View  Result Date: 04/21/2016 CLINICAL DATA:  56 y/o  F; shortness of breath. EXAM: CHEST  2 VIEW COMPARISON:  05/17/2016 chest CT.  02/26/2016 chest radiograph. FINDINGS: Stable heart size and mediastinal contours are within normal limits. Persistent stable opacity within the lingula in comparison with prior radiographs. No new focal consolidation of the lungs. The visualized skeletal structures are unremarkable. IMPRESSION: Persistent stable opacity within the lingula. No new focal consolidation of the lungs. Electronically Signed   By: Kristine Garbe M.D.   On: 04/21/2016 22:37    EKG:   Orders placed or performed during the hospital encounter of 04/21/16  . EKG 12-Lead  . EKG 12-Lead  . ED EKG  . ED EKG    IMPRESSION AND PLAN:  Principal Problem:   Asthma exacerbation - Likely due to influenza as well as possibly pneumonia. IV steroids, when necessary duo nebs and antitussive, antibiotic as below. Active Problems:   Pneumonia - IV antibiotics, we will give her azithromycin for now. We'll also get a pulmonology consult to help address this question of this persistent pneumonia.   Influenza A - Tamiflu, other supportive treatment as above   HTN (hypertension) - stable, continue home meds   Diabetes ([place]) - sliding scale insulin with corresponding glucose checks   GERD (gastroesophageal reflux disease) - home dose PPI  All the records are reviewed and case discussed with ED provider. Management plans discussed with the patient and/or family.  DVT PROPHYLAXIS: SubQ lovenox  GI PROPHYLAXIS:  PPI  ADMISSION STATUS: Inpatient  CODE STATUS: Full Code Status History    Date Active Date Inactive Code Status Order ID Comments User Context   02/26/2016  6:41 PM 02/28/2016  5:00 PM Full Code [id number]  Gladstone Lighter, MD ED   02/21/2016 10:52 AM 02/22/2016  2:11 PM Full Code [id number]  Max Sane, MD Inpatient      TOTAL TIME TAKING CARE OF THIS PATIENT: 45 minutes.    Jigar Zielke FIELDING 04/21/2016, 11:58 PM  [facility]  Office  [phone]  CC: Primary care physician; Lavera Guise, MD

## 2016-04-22 DIAGNOSIS — J101 Influenza due to other identified influenza virus with other respiratory manifestations: Secondary | ICD-10-CM

## 2016-04-22 DIAGNOSIS — J4551 Severe persistent asthma with (acute) exacerbation: Principal | ICD-10-CM

## 2016-04-22 LAB — GLUCOSE, CAPILLARY
GLUCOSE-CAPILLARY: 328 mg/dL — AB (ref 65–99)
GLUCOSE-CAPILLARY: 302 mg/dL — AB (ref 65–99)
GLUCOSE-CAPILLARY: 255 mg/dL — AB (ref 65–99)
Glucose-Capillary: 259 mg/dL — ABNORMAL HIGH (ref 65–99)
Glucose-Capillary: 280 mg/dL — ABNORMAL HIGH (ref 65–99)
Glucose-Capillary: 305 mg/dL — ABNORMAL HIGH (ref 65–99)

## 2016-04-22 LAB — CBC
HEMATOCRIT: 39.4 % (ref 35.0–47.0)
Hemoglobin: 13.9 g/dL (ref 12.0–16.0)
MCH: 32 pg (ref 26.0–34.0)
MCHC: 35.3 g/dL (ref 32.0–36.0)
MCV: 90.6 fL (ref 80.0–100.0)
Platelets: 188 10*3/uL (ref 150–440)
RBC: 4.35 MIL/uL (ref 3.80–5.20)
RDW: 14.1 % (ref 11.5–14.5)
WBC: 11.1 10*3/uL — AB (ref 3.6–11.0)

## 2016-04-22 LAB — BASIC METABOLIC PANEL
Anion gap: 9 (ref 5–15)
BUN: 12 mg/dL (ref 6–20)
CHLORIDE: 102 mmol/L (ref 101–111)
CO2: 24 mmol/L (ref 22–32)
Calcium: 8.7 mg/dL — ABNORMAL LOW (ref 8.9–10.3)
Creatinine, Ser: 0.78 mg/dL (ref 0.44–1.00)
GFR calc non Af Amer: >60 mL/min (ref >60)
Glucose, Bld: 294 mg/dL — ABNORMAL HIGH (ref 65–99)
Potassium: 4.6 mmol/L (ref 3.5–5.1)
SODIUM: 135 mmol/L (ref 135–145)

## 2016-04-22 LAB — INFLUENZA PANEL BY PCR (TYPE A & B)
INFLAPCR: POSITIVE — AB
INFLBPCR: NEGATIVE

## 2016-04-22 LAB — PROCALCITONIN

## 2016-04-22 MED ORDER — BUDESONIDE 0.25 MG/2ML IN SUSP
0.2500 mg | Freq: Four times a day (QID) | RESPIRATORY_TRACT | Status: DC
  Administered 2016-04-22: 16:00:00 0.25 mg via RESPIRATORY_TRACT
  Filled 2016-04-22: qty 2

## 2016-04-22 MED ORDER — GUAIFENESIN-DM 100-10 MG/5ML PO SYRP: 5.0000 mL | ORAL_SOLUTION | ORAL | Status: DC | PRN

## 2016-04-22 MED ORDER — METHYLPREDNISOLONE SODIUM SUCC 125 MG IJ SOLR
60.0000 mg | Freq: Four times a day (QID) | INTRAMUSCULAR | Status: DC
  Administered 2016-04-22 (×2): 60 mg via INTRAVENOUS
  Filled 2016-04-22 (×2): qty 2

## 2016-04-22 MED ORDER — ALPRAZOLAM 0.5 MG PO TABS
1.0000 mg | ORAL_TABLET | Freq: Every day | ORAL | Status: DC
  Administered 2016-04-22 – 2016-04-24 (×3): 1 mg via ORAL
  Filled 2016-04-22 (×3): qty 2

## 2016-04-22 MED ORDER — ASPIRIN EC 81 MG PO TBEC
81.0000 mg | DELAYED_RELEASE_TABLET | Freq: Every day | ORAL | Status: DC
  Administered 2016-04-22 – 2016-04-25 (×4): 81 mg via ORAL
  Filled 2016-04-22 (×4): qty 1

## 2016-04-22 MED ORDER — DEXTROSE 5 % IV SOLN
500.0000 mg | INTRAVENOUS | Status: DC
  Filled 2016-04-22: qty 500

## 2016-04-22 MED ORDER — ALPRAZOLAM 0.5 MG PO TABS: 0.5000 mg | ORAL_TABLET | Freq: Two times a day (BID) | ORAL | Status: DC

## 2016-04-22 MED ORDER — ONDANSETRON HCL 4 MG/2ML IJ SOLN: 4.0000 mg | Freq: Four times a day (QID) | INTRAMUSCULAR | Status: DC | PRN

## 2016-04-22 MED ORDER — FUROSEMIDE 40 MG PO TABS
80.0000 mg | ORAL_TABLET | Freq: Two times a day (BID) | ORAL | Status: DC
  Administered 2016-04-22 – 2016-04-25 (×7): 80 mg via ORAL
  Filled 2016-04-22 (×7): qty 2

## 2016-04-22 MED ORDER — DILTIAZEM HCL ER COATED BEADS 180 MG PO CP24
180.0000 mg | ORAL_CAPSULE | Freq: Two times a day (BID) | ORAL | Status: DC
  Administered 2016-04-22 – 2016-04-25 (×7): 180 mg via ORAL
  Filled 2016-04-22 (×7): qty 1

## 2016-04-22 MED ORDER — MONTELUKAST SODIUM 10 MG PO TABS
10.0000 mg | ORAL_TABLET | Freq: Every day | ORAL | Status: DC
  Administered 2016-04-22 – 2016-04-25 (×4): 10 mg via ORAL
  Filled 2016-04-22 (×4): qty 1

## 2016-04-22 MED ORDER — ACETAMINOPHEN 325 MG PO TABS
650.0000 mg | ORAL_TABLET | Freq: Four times a day (QID) | ORAL | Status: DC | PRN
  Administered 2016-04-22: 09:00:00 650 mg via ORAL
  Filled 2016-04-22: qty 2

## 2016-04-22 MED ORDER — THEOPHYLLINE ER 300 MG PO TB12
300.0000 mg | ORAL_TABLET | Freq: Two times a day (BID) | ORAL | Status: DC
  Administered 2016-04-22 – 2016-04-25 (×7): 300 mg via ORAL
  Filled 2016-04-22 (×7): qty 1

## 2016-04-22 MED ORDER — IPRATROPIUM-ALBUTEROL 0.5-2.5 (3) MG/3ML IN SOLN
3.0000 mL | RESPIRATORY_TRACT | Status: DC | PRN
  Administered 2016-04-22: 16:00:00 3 mL via RESPIRATORY_TRACT
  Filled 2016-04-22 (×2): qty 3

## 2016-04-22 MED ORDER — ENOXAPARIN SODIUM 40 MG/0.4ML ~~LOC~~ SOLN
40.0000 mg | SUBCUTANEOUS | Status: DC
  Administered 2016-04-22 – 2016-04-24 (×3): 40 mg via SUBCUTANEOUS
  Filled 2016-04-22 (×3): qty 0.4

## 2016-04-22 MED ORDER — IPRATROPIUM-ALBUTEROL 0.5-2.5 (3) MG/3ML IN SOLN
3.0000 mL | RESPIRATORY_TRACT | Status: DC | PRN
  Administered 2016-04-22: 3 mL via RESPIRATORY_TRACT
  Filled 2016-04-22: qty 3

## 2016-04-22 MED ORDER — OSELTAMIVIR PHOSPHATE 75 MG PO CAPS
75.0000 mg | ORAL_CAPSULE | Freq: Two times a day (BID) | ORAL | Status: DC
  Administered 2016-04-22 – 2016-04-25 (×8): 75 mg via ORAL
  Filled 2016-04-22 (×8): qty 1

## 2016-04-22 MED ORDER — PANTOPRAZOLE SODIUM 40 MG PO TBEC
40.0000 mg | DELAYED_RELEASE_TABLET | Freq: Every day | ORAL | Status: DC
  Administered 2016-04-22 – 2016-04-25 (×4): 40 mg via ORAL
  Filled 2016-04-22 (×4): qty 1

## 2016-04-22 MED ORDER — BUDESONIDE 0.25 MG/2ML IN SUSP
0.2500 mg | Freq: Four times a day (QID) | RESPIRATORY_TRACT | Status: DC
  Administered 2016-04-22 – 2016-04-25 (×10): 0.25 mg via RESPIRATORY_TRACT
  Filled 2016-04-22 (×9): qty 2

## 2016-04-22 MED ORDER — ONDANSETRON HCL 4 MG PO TABS: 4.0000 mg | ORAL_TABLET | Freq: Four times a day (QID) | ORAL | Status: DC | PRN

## 2016-04-22 MED ORDER — MOMETASONE FURO-FORMOTEROL FUM 200-5 MCG/ACT IN AERO
2.0000 | INHALATION_SPRAY | Freq: Two times a day (BID) | RESPIRATORY_TRACT | Status: DC
Start: 2016-04-22 — End: 2016-04-22
  Administered 2016-04-22: 2 via RESPIRATORY_TRACT
  Filled 2016-04-22: qty 8.8

## 2016-04-22 MED ORDER — IPRATROPIUM-ALBUTEROL 0.5-2.5 (3) MG/3ML IN SOLN
3.0000 mL | Freq: Four times a day (QID) | RESPIRATORY_TRACT | Status: DC
  Administered 2016-04-22 – 2016-04-25 (×13): 3 mL via RESPIRATORY_TRACT
  Filled 2016-04-22 (×11): qty 3

## 2016-04-22 MED ORDER — PNEUMOCOCCAL VAC POLYVALENT 25 MCG/0.5ML IJ INJ
0.5000 mL | INJECTION | INTRAMUSCULAR | Status: AC
  Administered 2016-04-25: 0.5 mL via INTRAMUSCULAR
  Filled 2016-04-22: qty 0.5

## 2016-04-22 MED ORDER — ALPRAZOLAM 0.5 MG PO TABS
0.5000 mg | ORAL_TABLET | Freq: Every morning | ORAL | Status: DC
  Administered 2016-04-22 – 2016-04-25 (×4): 0.5 mg via ORAL
  Filled 2016-04-22 (×4): qty 1

## 2016-04-22 MED ORDER — INSULIN ASPART 100 UNIT/ML ~~LOC~~ SOLN
0.0000 [IU] | Freq: Three times a day (TID) | SUBCUTANEOUS | Status: DC
  Administered 2016-04-22: 12:00:00 7 [IU] via SUBCUTANEOUS
  Administered 2016-04-22: 09:00:00 5 [IU] via SUBCUTANEOUS
  Administered 2016-04-22: 18:00:00 7 [IU] via SUBCUTANEOUS
  Filled 2016-04-22: qty 7
  Filled 2016-04-22: qty 5
  Filled 2016-04-22: qty 7

## 2016-04-22 MED ORDER — SUCRALFATE 1 G PO TABS
1.0000 g | ORAL_TABLET | Freq: Four times a day (QID) | ORAL | Status: DC
  Administered 2016-04-22 – 2016-04-25 (×13): 1 g via ORAL
  Filled 2016-04-22 (×14): qty 1

## 2016-04-22 MED ORDER — METHYLPREDNISOLONE SODIUM SUCC 125 MG IJ SOLR
80.0000 mg | Freq: Two times a day (BID) | INTRAMUSCULAR | Status: DC
  Administered 2016-04-23 – 2016-04-24 (×4): 80 mg via INTRAVENOUS
  Filled 2016-04-22 (×4): qty 2

## 2016-04-22 MED ORDER — IPRATROPIUM-ALBUTEROL 0.5-2.5 (3) MG/3ML IN SOLN
3.0000 mL | RESPIRATORY_TRACT | Status: DC
  Administered 2016-04-22 (×2): 3 mL via RESPIRATORY_TRACT
  Filled 2016-04-22 (×2): qty 3

## 2016-04-22 MED ORDER — ACETAMINOPHEN 650 MG RE SUPP: 650.0000 mg | Freq: Four times a day (QID) | RECTAL | Status: DC | PRN

## 2016-04-22 MED ORDER — INSULIN ASPART 100 UNIT/ML ~~LOC~~ SOLN
0.0000 [IU] | Freq: Every day | SUBCUTANEOUS | Status: DC
  Administered 2016-04-22 (×2): 3 [IU] via SUBCUTANEOUS
  Filled 2016-04-22 (×2): qty 3

## 2016-04-22 MED ORDER — ATORVASTATIN CALCIUM 20 MG PO TABS
10.0000 mg | ORAL_TABLET | Freq: Every day | ORAL | Status: DC
  Administered 2016-04-22 – 2016-04-25 (×4): 10 mg via ORAL
  Filled 2016-04-22 (×4): qty 1

## 2016-04-22 NOTE — Progress Notes (Signed)
[place] at [place] NAME: Cassie Ross    MR#:  [id number]  DATE OF BIRTH:  [birth year]  SUBJECTIVE:  CHIEF COMPLAINT:   Chief Complaint  Patient presents with  . Cough  . Fever  . Shortness of Breath  feels SOB, cough REVIEW OF SYSTEMS:  Review of Systems  Constitutional: Positive for malaise/fatigue. Negative for chills, fever and weight loss.  HENT: Negative for nosebleeds and sore throat.   Eyes: Negative for blurred vision.  Respiratory: Positive for cough, shortness of breath and wheezing.   Cardiovascular: Negative for chest pain, orthopnea, leg swelling and PND.  Gastrointestinal: Negative for abdominal pain, constipation, diarrhea, heartburn, nausea and vomiting.  Genitourinary: Negative for dysuria and urgency.  Musculoskeletal: Negative for back pain.  Skin: Negative for rash.  Neurological: Positive for weakness. Negative for dizziness, speech change, focal weakness and headaches.  Endo/Heme/Allergies: Does not bruise/bleed easily.  Psychiatric/Behavioral: Negative for depression.   DRUG ALLERGIES:   Allergies  Allergen Reactions  . Penicillins     Has patient had a PCN reaction causing immediate rash, facial/tongue/throat swelling, SOB or lightheadedness with hypotension: no Has patient had a PCN reaction causing severe rash involving mucus membranes or skin necrosis: no Has patient had a PCN reaction that required hospitalization no Has patient had a PCN reaction occurring within the last 10 years: no If all of the above answers are "NO", then may proceed with Cephalosporin use.   . Shellfish Allergy Swelling  . Sunflower Oil Swelling    seeds  . Levaquin [Levofloxacin] Rash    Allergy to pill form only. Patient has had IV with no problem.   VITALS:  Blood pressure 132/64, pulse (!) 101, temperature 98.4 F (36.9 C), temperature source Oral, resp. rate (!) 22, height 5\' 3"  (1.6 m), weight 93.5 kg (206 lb  1.6 oz), SpO2 91 %. PHYSICAL EXAMINATION:  Physical Exam  Constitutional: She is oriented to person, place, and time and well-developed, well-nourished, and in no distress.  HENT:  Head: Normocephalic and atraumatic.  Eyes: Conjunctivae and EOM are normal. Pupils are equal, round, and reactive to light.  Neck: Normal range of motion. Neck supple. No tracheal deviation present. No thyromegaly present.  Cardiovascular: Regular rhythm and normal heart sounds.  Tachycardia present.   Pulmonary/Chest: Effort normal. No respiratory distress. She has decreased breath sounds in the right lower field and the left lower field. She has wheezes. She has rhonchi. She exhibits no tenderness.  Abdominal: Soft. Bowel sounds are normal. She exhibits no distension. There is no tenderness.  Musculoskeletal: Normal range of motion.  Neurological: She is alert and oriented to person, place, and time. No cranial nerve deficit.  Skin: Skin is warm and dry. No rash noted.  Psychiatric: Mood and affect normal.   LABORATORY PANEL:   CBC  Recent Labs Lab 04/22/16 0436  WBC 11.1*  HGB 13.9  HCT 39.4  PLT 188   ------------------------------------------------------------------------------------------------------------------ Chemistries   Recent Labs Lab 04/22/16 0436  NA 135  K 4.6  CL 102  CO2 24  GLUCOSE 294*  BUN 12  CREATININE 0.78  CALCIUM 8.7*   RADIOLOGY:  Dg Chest 2 View  Result Date: 04/21/2016 CLINICAL DATA:  56 y/o  F; shortness of breath. EXAM: CHEST  2 VIEW COMPARISON:  05/17/2016 chest CT.  02/26/2016 chest radiograph. FINDINGS: Stable heart size and mediastinal contours are within normal limits. Persistent stable opacity within the lingula in comparison with prior radiographs.  No new focal consolidation of the lungs. The visualized skeletal structures are unremarkable. IMPRESSION: Persistent stable opacity within the lingula. No new focal consolidation of the lungs. Electronically  Signed   By: Kristine Garbe M.D.   On: 04/21/2016 22:37   ASSESSMENT AND PLAN:  56 y.o. female who presents with 2 days of cough and progressive shortness of breath. Patient has history of brittle asthma, and follows with her pulmonologist Dr. Humphrey Rolls. She is recently treated for suspicion of some pneumonia  * Asthma exacerbation - Likely due to influenza as well as possibly pneumonia. IV steroids, when necessary duo nebs and antitussive, antibiotic   * Pneumonia - continue IV antibiotics - azithromycin. Pending pulmo c/s - check procalcitonin  * Influenza A - Tamiflu, other supportive treatment   * HTN (hypertension) - stable, continue home meds  * Diabetes - sliding scale insulin with corresponding glucose checks  * GERD (gastroesophageal reflux disease) - home dose PPI     All the records are reviewed and case discussed with Care Management/Social Worker. Management plans discussed with the patient, family and they are in agreement.  CODE STATUS: full code  TOTAL TIME TAKING CARE OF THIS PATIENT: 35 minutes.   More than 50% of the time was spent in counseling/coordination of care: YES  POSSIBLE D/C IN 1-2 DAYS, DEPENDING ON CLINICAL CONDITION.   Max Sane M.D on 04/22/2016 at 12:04 PM  Between 7am to 6pm - Pager - [phone]  After 6pm go to [email or web address] - Proofreader  [facility]  Office  [phone]  CC: Primary care physician; Lavera Guise, MD  Note: This dictation was prepared with Dragon dictation along with smaller phrase technology. Any transcriptional errors that result from this process are unintentional.

## 2016-04-22 NOTE — Consult Note (Signed)
PULMONARY CONSULT NOTE  Requesting MD/Service: [facility] Date of initial consultation: 04/22/16 Reason for consultation: asthma exacerbation  PT PROFILE: 29 F with severe persistent asthma admitted 02/11 with acute exacerbation and influenza A positive  HPI:  As above. She noted increasing chest tightness and dyspnea X 2 days prior to admission. Also developed fever on the day of admission. She is now shown to be influenza A positive. At her baseline, she is affected by asthma every day. On her best day, she she has class II/III dyspnea. She is on maximum maintenance therapy including even theophylline and montelukast. She is unable to discern specific triggers. She goes through a course of prednisone on average every month. She has been hospitalized on average twice a year. She has been intubated twice in her life.  Past Medical History:  Diagnosis Date  . Asthma   . CHF (congestive heart failure) ([place])    1991- unknown after asthma attack  . COPD (chronic obstructive pulmonary disease) ([place])   . Diabetes ([place])   . GERD (gastroesophageal reflux disease)   . Hypertension   . Hypertension     Past Surgical History:  Procedure Laterality Date  . CESAREAN SECTION    . CHOLECYSTECTOMY    . ESOPHAGEAL DILATION    . HERNIA REPAIR    . NASAL SINUS SURGERY      MEDICATIONS: I have reviewed all medications and confirmed regimen as documented  Social History   Social History  . Marital status: Married    Spouse name: N/A  . Number of children: N/A  . Years of education: N/A   Occupational History  . Not on file.   Social History Main Topics  . Smoking status: Never Smoker  . Smokeless tobacco: Never Used  . Alcohol use No  . Drug use: No  . Sexual activity: Not on file   Other Topics Concern  . Not on file   Social History Narrative   Lives at home with family. Independent       Family History  Problem Relation Age of Onset  . Hypertension Mother      ROS: No fever, myalgias/arthralgias, unexplained weight loss or weight gain No new focal weakness or sensory deficits No otalgia, hearing loss, visual changes, nasal and sinus symptoms, mouth and throat problems No neck pain or adenopathy No abdominal pain, N/V/D, diarrhea, change in bowel pattern No dysuria, change in urinary pattern   Vitals:   04/22/16 0504 04/22/16 0905 04/22/16 0910 04/22/16 1301  BP: (!) 118/59 132/64  133/77  Pulse: (!) 109 (!) 101  (!) 116  Resp: (!) 22   20  Temp: 98.3 F (36.8 C) 98.4 F (36.9 C)  98.3 F (36.8 C)  TempSrc: Oral Oral  Oral  SpO2: 96%  91% 95%  Weight:      Height:         EXAM:  Gen: WDWN, No overt respiratory distress HEENT: NCAT, sclera white, oropharynx normal Neck: Supple without LAN, thyromegaly, JVD Lungs: Percussion normal, diffuse coarse wheezes Cardiovascular: RRR, no murmurs noted Abdomen: Soft, nontender, normal BS Ext: without clubbing, cyanosis, edema Neuro: CNs grossly intact, motor and sensory intact Skin: Limited exam, no lesions noted  DATA:   BMP Latest Ref Rng & Units 04/22/2016 04/21/2016 02/27/2016  Glucose 65 - 99 mg/dL 294(H) 156(H) 278(H)  BUN 6 - 20 mg/dL 12 13 15   Creatinine 0.44 - 1.00 mg/dL 0.78 0.75 0.63  Sodium 135 - 145 mmol/L 135 141 140  Potassium 3.5 - 5.1 mmol/L 4.6 3.9 3.9  Chloride 101 - 111 mmol/L 102 103 100(L)  CO2 22 - 32 mmol/L 24 28 30   Calcium 8.9 - 10.3 mg/dL 8.7(L) 9.1 8.3(L)    CBC Latest Ref Rng & Units 04/22/2016 04/21/2016 02/27/2016  WBC 3.6 - 11.0 K/uL 11.1(H) 9.6 13.2(H)  Hemoglobin 12.0 - 16.0 g/dL 13.9 14.5 14.2  Hematocrit 35.0 - 47.0 % 39.4 41.6 41.6  Platelets 150 - 440 K/uL 188 250 283    CXR:  No acute infiltrates  IMPRESSION:   Severe persistent asthma - essentially prednisone dependent Acute asthma exacerbation Influenza A positive No evidence of bacterial infection  PLAN:  1) Add nebulized steroids 2) Continue scheduled nebulized  bronchodilators 3) Continue systemic steroids - dose adjusted 4) Complete course of oseltamivir 5) DC azithromycin 6) At discharge, she should resume her previous maintenance regimen 7) After discharge, she might be a good candidate for one of the asthma biologicals such as benralizumab Berna Bue)  She is followed by Dr Humphrey Rolls as an outpt. I have spoken with him. I will be available as needed during this hospitalization. Follow up after discharge with Dr Mechele Dawley, MD PCCM service Mobile [phone] Pager [phone] 04/22/2016

## 2016-04-22 NOTE — Progress Notes (Signed)
Inpatient Diabetes Program Recommendations  AACE/ADA: New Consensus Statement on Inpatient Glycemic Control (2015)  Target Ranges:  Prepandial:   less than 140 mg/dL      Peak postprandial:   less than 180 mg/dL (1-2 hours)      Critically ill patients:  140 - 180 mg/dL   Results for Cassie Ross, Cassie Ross (MRN [id number]) as of 04/22/2016 12:30  Ref. Range 04/22/2016 02:03 04/22/2016 07:42 04/22/2016 11:43  Glucose-Capillary Latest Ref Range: 65 - 99 mg/dL 259 (H) 280 (H) 328 (H)    Admit +Flu/ Questionable Pneumonia   History: DM, CHF, COPD  Home DM Meds: Humalog 2-10 units TID per SSI  Current Orders: Novolog Sensitive Correction Scale/ SSI (0-9 units) TID AC + HS (started at 2am).      - Note Pt got 125 mg Solumedrol at 10pm and now getting Solumedrol 60 mg Q6H.  -Glucose levels quite elevated.    MD- Please consider the following in-hospital insulin adjustments while patient getting IV Steroids:  1. Start basal insulin: Lantus 14 units daily (0.15 units/kg based on weight of 93 kg)  2. Increase Novolog Correction Scale/ SSI to Moderate scale (0-15 units) TID AC + HS     --Will follow patient during hospitalization--  Wyn Quaker RN, MSN, CDE Diabetes Coordinator Inpatient Glycemic Control Team Team Pager: [phone] (8a-5p)

## 2016-04-23 LAB — GLUCOSE, CAPILLARY
GLUCOSE-CAPILLARY: 243 mg/dL — AB (ref 65–99)
Glucose-Capillary: 277 mg/dL — ABNORMAL HIGH (ref 65–99)
Glucose-Capillary: 294 mg/dL — ABNORMAL HIGH (ref 65–99)
Glucose-Capillary: 248 mg/dL — ABNORMAL HIGH (ref 65–99)

## 2016-04-23 LAB — HEMOGLOBIN A1C
Hgb A1c MFr Bld: 7.6 % — ABNORMAL HIGH (ref 4.8–5.6)
Mean Plasma Glucose: 171 mg/dL

## 2016-04-23 MED ORDER — INSULIN ASPART 100 UNIT/ML ~~LOC~~ SOLN
0.0000 [IU] | Freq: Every day | SUBCUTANEOUS | Status: DC
  Administered 2016-04-23: 21:00:00 2 [IU] via SUBCUTANEOUS
  Administered 2016-04-24: 22:00:00 4 [IU] via SUBCUTANEOUS
  Filled 2016-04-23: qty 2
  Filled 2016-04-23: qty 3

## 2016-04-23 MED ORDER — INSULIN ASPART 100 UNIT/ML ~~LOC~~ SOLN
0.0000 [IU] | Freq: Three times a day (TID) | SUBCUTANEOUS | Status: DC
  Administered 2016-04-23 (×2): 8 [IU] via SUBCUTANEOUS
  Administered 2016-04-23: 17:00:00 5 [IU] via SUBCUTANEOUS
  Administered 2016-04-24: 09:00:00 8 [IU] via SUBCUTANEOUS
  Administered 2016-04-24 (×2): 11 [IU] via SUBCUTANEOUS
  Administered 2016-04-25 (×2): 5 [IU] via SUBCUTANEOUS
  Filled 2016-04-23: qty 5
  Filled 2016-04-23 (×2): qty 8
  Filled 2016-04-23: qty 11
  Filled 2016-04-23 (×2): qty 5
  Filled 2016-04-23: qty 8
  Filled 2016-04-23: qty 11

## 2016-04-23 MED ORDER — INSULIN GLARGINE 100 UNIT/ML ~~LOC~~ SOLN
15.0000 [IU] | Freq: Every day | SUBCUTANEOUS | Status: DC
  Administered 2016-04-23: 15 [IU] via SUBCUTANEOUS
  Filled 2016-04-23 (×2): qty 0.15

## 2016-04-23 NOTE — Care Management Important Message (Signed)
Important Message  Patient Details  Name: Cassie Ross MRN: [id number] Date of Birth: 07-25-1960   Medicare Important Message Given:  Yes    Shelbie Ammons, RN 04/23/2016, 8:23 AM

## 2016-04-23 NOTE — Care Management Important Message (Signed)
Important Message  Patient Details  Name: EMILEIGH GAMELIN MRN: [id number] Date of Birth: 20-Oct-1960   Medicare Important Message Given:  Yes    Shelbie Ammons, RN 04/23/2016, 8:23 AM

## 2016-04-23 NOTE — Progress Notes (Signed)
[place] at [place] NAME: Cassie Ross    MR#:  [id number]  DATE OF BIRTH:  04-03-1960  SUBJECTIVE:  CHIEF COMPLAINT:   Chief Complaint  Patient presents with  . Cough  . Fever  . Shortness of Breath  still feels sob, having bad cough. Not having good day this am, says her husband now also got flu and is going to [facility] walk-in clinic to get it checked out REVIEW OF SYSTEMS:  Review of Systems  Constitutional: Positive for malaise/fatigue. Negative for chills, fever and weight loss.  HENT: Negative for nosebleeds and sore throat.   Eyes: Negative for blurred vision.  Respiratory: Positive for cough, shortness of breath and wheezing.   Cardiovascular: Negative for chest pain, orthopnea, leg swelling and PND.  Gastrointestinal: Negative for abdominal pain, constipation, diarrhea, heartburn, nausea and vomiting.  Genitourinary: Negative for dysuria and urgency.  Musculoskeletal: Negative for back pain.  Skin: Negative for rash.  Neurological: Positive for weakness. Negative for dizziness, speech change, focal weakness and headaches.  Endo/Heme/Allergies: Does not bruise/bleed easily.  Psychiatric/Behavioral: Negative for depression.   DRUG ALLERGIES:   Allergies  Allergen Reactions  . Penicillins     Has patient had a PCN reaction causing immediate rash, facial/tongue/throat swelling, SOB or lightheadedness with hypotension: no Has patient had a PCN reaction causing severe rash involving mucus membranes or skin necrosis: no Has patient had a PCN reaction that required hospitalization no Has patient had a PCN reaction occurring within the last 10 years: no If all of the above answers are "NO", then may proceed with Cephalosporin use.   . Shellfish Allergy Swelling  . Sunflower Oil Swelling    seeds  . Levaquin [Levofloxacin] Rash    Allergy to pill form only. Patient has had IV with no problem.   VITALS:  Blood pressure 121/65,  pulse (!) 104, temperature 97.9 F (36.6 C), temperature source Oral, resp. rate 18, height 5\' 3"  (1.6 m), weight 92.5 kg (203 lb 14.4 oz), SpO2 96 %. PHYSICAL EXAMINATION:  Physical Exam  Constitutional: She is oriented to person, place, and time and well-developed, well-nourished, and in no distress.  HENT:  Head: Normocephalic and atraumatic.  Eyes: Conjunctivae and EOM are normal. Pupils are equal, round, and reactive to light.  Neck: Normal range of motion. Neck supple. No tracheal deviation present. No thyromegaly present.  Cardiovascular: Regular rhythm and normal heart sounds.  Tachycardia present.   Pulmonary/Chest: Effort normal. No respiratory distress. She has decreased breath sounds in the right lower field and the left lower field. She has wheezes. She has rhonchi. She exhibits no tenderness.  Abdominal: Soft. Bowel sounds are normal. She exhibits no distension. There is no tenderness.  Musculoskeletal: Normal range of motion.  Neurological: She is alert and oriented to person, place, and time. No cranial nerve deficit.  Skin: Skin is warm and dry. No rash noted.  Psychiatric: Mood and affect normal.   LABORATORY PANEL:   CBC  Recent Labs Lab 04/22/16 0436  WBC 11.1*  HGB 13.9  HCT 39.4  PLT 188   ------------------------------------------------------------------------------------------------------------------ Chemistries   Recent Labs Lab 04/22/16 0436  NA 135  K 4.6  CL 102  CO2 24  GLUCOSE 294*  BUN 12  CREATININE 0.78  CALCIUM 8.7*   RADIOLOGY:  No results found. ASSESSMENT AND PLAN:  56 y.o. female who presents with 2 days of cough and progressive shortness of breath. Patient has history of brittle  asthma, and follows with her pulmonologist Dr. Humphrey Rolls. She is recently treated for suspicion of some pneumonia  * Asthma exacerbation - Likely due to influenza. IV steroids, when necessary duo nebs and antitussive  * Pneumonia - ruled out. Appreciate  pulmo c/s -  Procalcitonin low - stop azithromycin - likely due to influenza  * Influenza A - Tamiflu, other supportive treatment   * HTN (hypertension) - stable, continue home meds  * Diabetes - sliding scale insulin with corresponding glucose checks - add lantus 15 units SQ daily  * GERD (gastroesophageal reflux disease) - home dose PPI   D/C tele  All the records are reviewed and case discussed with Care Management/Social Worker. Management plans discussed with the patient, Nursing and they are in agreement.  CODE STATUS: full code  TOTAL TIME TAKING CARE OF THIS PATIENT: 35 minutes.   More than 50% of the time was spent in counseling/coordination of care: YES  POSSIBLE D/C IN 1-2 DAYS, DEPENDING ON CLINICAL CONDITION.   Max Sane M.D on 04/23/2016 at 9:55 AM  Between 7am to 6pm - Pager - [phone]  After 6pm go to [email or web address] - Proofreader  [facility]  Office  [phone]  CC: Primary care physician; Lavera Guise, MD  Note: This dictation was prepared with Dragon dictation along with smaller phrase technology. Any transcriptional errors that result from this process are unintentional.

## 2016-04-24 ENCOUNTER — Inpatient Hospital Stay: Payer: Medicare HMO

## 2016-04-24 LAB — BASIC METABOLIC PANEL
ANION GAP: 10 (ref 5–15)
BUN: 25 mg/dL — ABNORMAL HIGH (ref 6–20)
CALCIUM: 8.8 mg/dL — AB (ref 8.9–10.3)
CO2: 30 mmol/L (ref 22–32)
Chloride: 97 mmol/L — ABNORMAL LOW (ref 101–111)
Creatinine, Ser: 0.89 mg/dL (ref 0.44–1.00)
GFR calc non Af Amer: >60 mL/min (ref >60)
Glucose, Bld: 255 mg/dL — ABNORMAL HIGH (ref 65–99)
POTASSIUM: 2.9 mmol/L — AB (ref 3.5–5.1)
Sodium: 137 mmol/L (ref 135–145)

## 2016-04-24 LAB — GLUCOSE, CAPILLARY
GLUCOSE-CAPILLARY: 320 mg/dL — AB (ref 65–99)
Glucose-Capillary: 263 mg/dL — ABNORMAL HIGH (ref 65–99)
Glucose-Capillary: 327 mg/dL — ABNORMAL HIGH (ref 65–99)
Glucose-Capillary: 309 mg/dL — ABNORMAL HIGH (ref 65–99)

## 2016-04-24 LAB — CBC
HCT: 38.1 % (ref 35.0–47.0)
HEMOGLOBIN: 13.1 g/dL (ref 12.0–16.0)
MCH: 31 pg (ref 26.0–34.0)
MCHC: 34.4 g/dL (ref 32.0–36.0)
MCV: 90.1 fL (ref 80.0–100.0)
Platelets: 240 10*3/uL (ref 150–440)
RBC: 4.23 MIL/uL (ref 3.80–5.20)
RDW: 13.8 % (ref 11.5–14.5)
WBC: 16.6 10*3/uL — AB (ref 3.6–11.0)

## 2016-04-24 LAB — EXPECTORATED SPUTUM ASSESSMENT W GRAM STAIN, RFLX TO RESP C

## 2016-04-24 LAB — MAGNESIUM: MAGNESIUM: 2 mg/dL (ref 1.7–2.4)

## 2016-04-24 LAB — PROCALCITONIN

## 2016-04-24 LAB — EXPECTORATED SPUTUM ASSESSMENT W REFEX TO RESP CULTURE: SPECIAL REQUESTS: NORMAL

## 2016-04-24 MED ORDER — INSULIN GLARGINE 100 UNIT/ML ~~LOC~~ SOLN
18.0000 [IU] | Freq: Every day | SUBCUTANEOUS | Status: DC
  Administered 2016-04-24 – 2016-04-25 (×2): 18 [IU] via SUBCUTANEOUS
  Filled 2016-04-24 (×2): qty 0.18

## 2016-04-24 MED ORDER — PREDNISONE 50 MG PO TABS
60.0000 mg | ORAL_TABLET | Freq: Every day | ORAL | Status: AC
  Administered 2016-04-25: 60 mg via ORAL
  Filled 2016-04-24: qty 1

## 2016-04-24 MED ORDER — PREDNISONE 20 MG PO TABS: 40.0000 mg | ORAL_TABLET | Freq: Every day | ORAL | Status: DC

## 2016-04-24 MED ORDER — PREDNISONE 20 MG PO TABS: 30.0000 mg | ORAL_TABLET | Freq: Every day | ORAL | Status: DC

## 2016-04-24 MED ORDER — POTASSIUM CHLORIDE CRYS ER 20 MEQ PO TBCR
40.0000 meq | EXTENDED_RELEASE_TABLET | Freq: Two times a day (BID) | ORAL | Status: DC
Start: 2016-04-24 — End: 2016-04-24

## 2016-04-24 MED ORDER — POTASSIUM CHLORIDE 2 MEQ/ML IV SOLN: 30.0000 meq | Freq: Once | INTRAVENOUS | Status: DC

## 2016-04-24 MED ORDER — PREDNISONE 50 MG PO TABS: 50.0000 mg | ORAL_TABLET | Freq: Every day | ORAL | Status: DC

## 2016-04-24 MED ORDER — PREDNISONE 10 MG PO TABS: 10.0000 mg | ORAL_TABLET | Freq: Every day | ORAL | Status: DC

## 2016-04-24 MED ORDER — PREDNISONE 20 MG PO TABS: 20.0000 mg | ORAL_TABLET | Freq: Every day | ORAL | Status: DC

## 2016-04-24 MED ORDER — POTASSIUM CHLORIDE CRYS ER 20 MEQ PO TBCR
40.0000 meq | EXTENDED_RELEASE_TABLET | ORAL | Status: AC
  Administered 2016-04-24 (×2): 40 meq via ORAL
  Filled 2016-04-24 (×2): qty 2

## 2016-04-24 NOTE — Progress Notes (Signed)
[place] at [place] NAME: Ary Helman    MR#:  [id number]  DATE OF BIRTH:  [birth year]  SUBJECTIVE:  CHIEF COMPLAINT:   Chief Complaint  Patient presents with  . Cough  . Fever  . Shortness of Breath  Feeling little bit better shortness of breath is improved denies any chest pain   REVIEW OF SYSTEMS:  Review of Systems  Constitutional: Positive for malaise/fatigue. Negative for chills, fever and weight loss.  HENT: Negative for nosebleeds and sore throat.   Eyes: Negative for blurred vision.  Respiratory: Positive for cough, shortness of breath and wheezing.   Cardiovascular: Negative for chest pain, orthopnea, leg swelling and PND.  Gastrointestinal: Negative for abdominal pain, constipation, diarrhea, heartburn, nausea and vomiting.  Genitourinary: Negative for dysuria and urgency.  Musculoskeletal: Negative for back pain.  Skin: Negative for rash.  Neurological: Positive for weakness. Negative for dizziness, speech change, focal weakness and headaches.  Endo/Heme/Allergies: Does not bruise/bleed easily.  Psychiatric/Behavioral: Negative for depression.   DRUG ALLERGIES:   Allergies  Allergen Reactions  . Penicillins     Has patient had a PCN reaction causing immediate rash, facial/tongue/throat swelling, SOB or lightheadedness with hypotension: no Has patient had a PCN reaction causing severe rash involving mucus membranes or skin necrosis: no Has patient had a PCN reaction that required hospitalization no Has patient had a PCN reaction occurring within the last 10 years: no If all of the above answers are "NO", then may proceed with Cephalosporin use.   . Shellfish Allergy Swelling  . Sunflower Oil Swelling    seeds  . Levaquin [Levofloxacin] Rash    Allergy to pill form only. Patient has had IV with no problem.   VITALS:  Blood pressure (!) 112/49, pulse 75, temperature 98 F (36.7 C), temperature source Oral,  resp. rate 20, height 5\' 3"  (1.6 m), weight 203 lb 14.4 oz (92.5 kg), SpO2 94 %. PHYSICAL EXAMINATION:  Physical Exam  Constitutional: She is oriented to person, place, and time and well-developed, well-nourished, and in no distress.  HENT:  Head: Normocephalic and atraumatic.  Eyes: Conjunctivae and EOM are normal. Pupils are equal, round, and reactive to light.  Neck: Normal range of motion. Neck supple. No tracheal deviation present. No thyromegaly present.  Cardiovascular: Regular rhythm and normal heart sounds.  Tachycardia present.   Pulmonary/Chest: Effort normal. No respiratory distress. She has decreased breath sounds in the right lower field and the left lower field. She has wheezes. She has rhonchi. She exhibits no tenderness.  Abdominal: Soft. Bowel sounds are normal. She exhibits no distension. There is no tenderness.  Musculoskeletal: Normal range of motion.  Neurological: She is alert and oriented to person, place, and time. No cranial nerve deficit.  Skin: Skin is warm and dry. No rash noted.  Psychiatric: Mood and affect normal.   LABORATORY PANEL:   CBC  Recent Labs Lab 04/24/16 0443  WBC 16.6*  HGB 13.1  HCT 38.1  PLT 240   ------------------------------------------------------------------------------------------------------------------ Chemistries   Recent Labs Lab 04/24/16 0443  NA 137  K 2.9*  CL 97*  CO2 30  GLUCOSE 255*  BUN 25*  CREATININE 0.89  CALCIUM 8.8*  MG 2.0   RADIOLOGY:  Dg Chest 2 View  Result Date: 04/24/2016 CLINICAL DATA:  Cough and fever.  Follow-up. EXAM: CHEST  2 VIEW COMPARISON:  Three days ago FINDINGS: Hazy lingular opacity that correlates with fat pad and mild lung opacification. Normal  heart size and mediastinal contours. Chronic hyperinflation and mild bronchitic airway thickening. IMPRESSION: Stable compared to 3 days ago.  No suspected acute disease. Electronically Signed   By: Monte Fantasia M.D.   On: 04/24/2016 08:10     ASSESSMENT AND PLAN:  56 y.o. female who presents with 2 days of cough and progressive shortness of breath. Patient has history of brittle asthma, and follows with her pulmonologist Dr. Humphrey Rolls. She is recently treated for suspicion of some pneumonia  * Asthma exacerbation - Likely due to influenza. IV steroids, when necessary duo nebs and antitussive Patient is improving we'll try to wean oxygen  * Pneumonia - ruled out.   * Influenza A - Tamiflu, other supportive treatment   * HTN (hypertension) - stable, continue home meds  * Diabetes - sliding scale insulin with corresponding glucose checks - add lantus 15 units SQ daily  * GERD (gastroesophageal reflux disease) - home dose PPI   D/C tele  All the records are reviewed and case discussed with Care Management/Social Worker. Management plans discussed with the patient, Nursing and they are in agreement.  CODE STATUS: full code  TOTAL TIME TAKING CARE OF THIS PATIENT: 35 minutes.   More than 50% of the time was spent in counseling/coordination of care: YES  POSSIBLE D/C IN 1-2 DAYS, DEPENDING ON CLINICAL CONDITION.   Dustin Flock M.D on 04/24/2016 at 4:19 PM  Between 7am to 6pm - Pager - [phone]  After 6pm go to [email or web address] - Proofreader  [facility]  Office  [phone]  CC: Primary care physician; Lavera Guise, MD  Note: This dictation was prepared with Dragon dictation along with smaller phrase technology. Any transcriptional errors that result from this process are unintentional.

## 2016-04-24 NOTE — Progress Notes (Addendum)
Inpatient Diabetes Program Recommendations  AACE/ADA: New Consensus Statement on Inpatient Glycemic Control (2015)  Target Ranges:  Prepandial:   less than 140 mg/dL      Peak postprandial:   less than 180 mg/dL (1-2 hours)      Critically ill patients:  140 - 180 mg/dL   Results for DORTHA, WIKEL (MRN [id number]) as of 04/24/2016 08:27  Ref. Range 04/23/2016 07:26 04/23/2016 11:33 04/23/2016 16:37 04/23/2016 21:18  Glucose-Capillary Latest Ref Range: 65 - 99 mg/dL 277 (H) 294 (H) 248 (H) 243 (H)   Results for BRINAE, REINHOLTZ (MRN [id number]) as of 04/24/2016 08:27  Ref. Range 04/24/2016 07:42  Glucose-Capillary Latest Ref Range: 65 - 99 mg/dL 263 (H)     Admit +Flu/ Questionable Pneumonia   History: DM, CHF, COPD  Home DM Meds: Humalog 2-10 units TID per SSI  Current Orders: Novolog Moderate Correction Scale/ SSI (0-15 units) TID AC + HS      Lantus 15 units daily    - Patient getting Solumedrol 80 mg BID.  -Glucose levels quite elevated.  -Lantus 15 units daily started yesterday.    MD- Please consider the following in-hospital insulin adjustments if patient will continue to receive IV Steroids today:  1. Increase Lantus to 20 units daily   2. Start Novolog Meal Coverage: Novolog 4 units TID with meals    --Will follow patient during hospitalization--  Wyn Quaker RN, MSN, CDE Diabetes Coordinator Inpatient Glycemic Control Team Team Pager: [phone] (8a-5p)

## 2016-04-25 DIAGNOSIS — Z23 Encounter for immunization: Secondary | ICD-10-CM | POA: Diagnosis not present

## 2016-04-25 LAB — BASIC METABOLIC PANEL
ANION GAP: 11 (ref 5–15)
BUN: 31 mg/dL — ABNORMAL HIGH (ref 6–20)
CHLORIDE: 96 mmol/L — AB (ref 101–111)
CO2: 30 mmol/L (ref 22–32)
Calcium: 8.4 mg/dL — ABNORMAL LOW (ref 8.9–10.3)
Creatinine, Ser: 0.81 mg/dL (ref 0.44–1.00)
GFR calc Af Amer: >60 mL/min (ref >60)
GFR calc non Af Amer: >60 mL/min (ref >60)
GLUCOSE: 258 mg/dL — AB (ref 65–99)
Potassium: 3.5 mmol/L (ref 3.5–5.1)
SODIUM: 137 mmol/L (ref 135–145)

## 2016-04-25 LAB — GLUCOSE, CAPILLARY
GLUCOSE-CAPILLARY: 233 mg/dL — AB (ref 65–99)
Glucose-Capillary: 210 mg/dL — ABNORMAL HIGH (ref 65–99)

## 2016-04-25 MED ORDER — PREDNISONE 10 MG (21) PO TBPK: 10.0000 mg | ORAL_TABLET | Freq: Every day | ORAL | 0 refills | Status: DC

## 2016-04-25 MED ORDER — OSELTAMIVIR PHOSPHATE 75 MG PO CAPS: 75.0000 mg | ORAL_CAPSULE | Freq: Two times a day (BID) | ORAL | 0 refills | Status: AC

## 2016-04-25 MED ORDER — GUAIFENESIN-DM 100-10 MG/5ML PO SYRP: 5.0000 mL | ORAL_SOLUTION | ORAL | 0 refills | Status: DC | PRN

## 2016-04-25 NOTE — Progress Notes (Addendum)
Inpatient Diabetes Program Recommendations  AACE/ADA: New Consensus Statement on Inpatient Glycemic Control (2015)  Target Ranges:  Prepandial:   less than 140 mg/dL      Peak postprandial:   less than 180 mg/dL (1-2 hours)      Critically ill patients:  140 - 180 mg/dL   Lab Results  Component Value Date   GLUCAP 233 (H) 04/25/2016   HGBA1C 7.6 (H) 04/22/2016    Review of Glycemic Control  Results for Cassie Ross, WEILERT (MRN [id number]) as of 04/25/2016 09:29  Ref. Range 04/24/2016 07:42 04/24/2016 11:35 04/24/2016 16:52 04/24/2016 20:34 04/25/2016 07:38  Glucose-Capillary Latest Ref Range: 65 - 99 mg/dL 263 (H) 320 (H) 327 (H) 309 (H) 233 (H)   Home DM Meds: Humalog 2-10 units TID per SSI  Current Orders: Novolog Moderate Correction Scale/ SSI (0-15 units) TID , Novolog 0-5 units qhs                            Lantus 18 units daily    Gentry Fitz, RN, [place], [place], CDE Diabetes Coordinator Inpatient Diabetes Program  [phone] (Team Pager) [phone] ([place]) 04/25/2016 9:34 AM

## 2016-04-25 NOTE — Progress Notes (Signed)
Ambulated around nursing station 4xs. Patient O2 93% while ambulating.

## 2016-04-25 NOTE — Discharge Instructions (Signed)
[facility] - [place] at [facility] ° °DIET:  °Cardiac diet ° °DISCHARGE CONDITION:  °Stable ° °ACTIVITY:  °Activity as tolerated ° °OXYGEN:  °Home Oxygen: No. °  °Oxygen Delivery: room air ° °DISCHARGE LOCATION:  °home  ° ° °ADDITIONAL DISCHARGE INSTRUCTION: ° ° °If you experience worsening of your admission symptoms, develop shortness of breath, life threatening emergency, suicidal or homicidal thoughts you must seek medical attention immediately by calling 911 or calling your MD immediately  if symptoms less severe. ° °You Must read complete instructions/literature along with all the possible adverse reactions/side effects for all the Medicines you take and that have been prescribed to you. Take any new Medicines after you have completely understood and accpet all the possible adverse reactions/side effects.  ° °Please note ° °You were cared for by a hospitalist during your hospital stay. If you have any questions about your discharge medications or the care you received while you were in the hospital after you are discharged, you can call the unit and asked to speak with the hospitalist on call if the hospitalist that took care of you is not available. Once you are discharged, your primary care physician will handle any further medical issues. Please note that NO REFILLS for any discharge medications will be authorized once you are discharged, as it is imperative that you return to your primary care physician (or establish a relationship with a primary care physician if you do not have one) for your aftercare needs so that they can reassess your need for medications and monitor your lab values. ° ° °

## 2016-04-25 NOTE — Progress Notes (Signed)
Discharge instructions given and went over with patient at bedside. Prescriptions given and reviewed. All questions answered. Patient discharged home with husband via wheelchair by nursing staff. Madlyn Frankel, RN

## 2016-04-25 NOTE — Discharge Summary (Signed)
Cassie Ross at [facility], 56 y.o., DOB [birth year], MRN [id number]. Admission date: 04/21/2016 Discharge Date 04/25/2016 Primary MD Lavera Guise, MD Admitting Physician Lance Coon, MD  Admission Diagnosis  Community acquired pneumonia of left lower lobe of lung [facility]) [J18.1]  Discharge Diagnosis   Principal Problem:   Asthma exacerbation    Pneumonia rulled out   HTN (hypertension)   Diabetes ([place])   GERD (gastroesophageal reflux disease)   Influenza A            Hospital Course patient is a 56 year old female presenting with 2 days history of cough and progressive shortness of breath. She does have history of asthma. In the ER she was noted to have shortness of breath wheezing and found to have pneumonia. She was admitted for further evaluation and therapy. Chest x-ray did show a abnormality however seen by pulmonary and they did not feel she had pneumonia. She was treated for asthma exasperation and the flu with significant improvement in her symptoms. Patient's breathing is much improved.              Consults  pulmonary/intensive care  Significant Tests:  See full reports for all details     Dg Chest 2 View  Result Date: 04/24/2016 CLINICAL DATA:  Cough and fever.  Follow-up. EXAM: CHEST  2 VIEW COMPARISON:  Three days ago FINDINGS: Hazy lingular opacity that correlates with fat pad and mild lung opacification. Normal heart size and mediastinal contours. Chronic hyperinflation and mild bronchitic airway thickening. IMPRESSION: Stable compared to 3 days ago.  No suspected acute disease. Electronically Signed   By: Monte Fantasia M.D.   On: 04/24/2016 08:10   Dg Chest 2 View  Result Date: 04/21/2016 CLINICAL DATA:  56 y/o  F; shortness of breath. EXAM: CHEST  2 VIEW COMPARISON:  05/17/2016 chest CT.  02/26/2016 chest radiograph. FINDINGS: Stable heart size and mediastinal contours are within normal limits. Persistent  stable opacity within the lingula in comparison with prior radiographs. No new focal consolidation of the lungs. The visualized skeletal structures are unremarkable. IMPRESSION: Persistent stable opacity within the lingula. No new focal consolidation of the lungs. Electronically Signed   By: Kristine Garbe M.D.   On: 04/21/2016 22:37   Ct Chest Wo Contrast  Result Date: 04/19/2016 CLINICAL DATA:  Recent pneumonia.  New chest pain EXAM: CT CHEST WITHOUT CONTRAST TECHNIQUE: Multidetector CT imaging of the chest was performed following the standard protocol without IV contrast. COMPARISON:  April 03, 2016 FINDINGS: Cardiovascular: There is no evident thoracic aortic aneurysm. There is slight calcification in the proximal visualized great vessels. Visualized great vessels otherwise appear unremarkable on this noncontrast enhanced study. There is mild calcification in the aortic arch region. There are foci of coronary artery calcification. Pericardium is not appreciably thickened. Mediastinum/Nodes: Thyroid appears unremarkable. There is no evident thoracic adenopathy. There is a fairly small focal hiatal hernia. Lungs/Pleura: There remains focal airspace consolidation in the inferior lingula, somewhat less than on the previous study. Mild atelectasis is noted in the medial segment of the right middle lobe, very little changed. There is no new consolidation on either side. There is no pleural effusion or pleural thickening. Upper Abdomen: Gallbladder absent. Visualized upper abdominal structures otherwise unremarkable. Musculoskeletal: No blastic or lytic bone lesions. No evident chest wall lesion. IMPRESSION: Patchy airspace opacity in the inferior lingula with a degree of partial clearing compared to the previous study. This area opacity currently measures  1.8 x 1.2 cm compared to 2.9 x 1.5 cm on most recent prior study. Atelectasis is again noted in the medial aspect of the right middle lobe adjacent to  the inferior anterior mediastinum. No new opacities are identified. No evident adenopathy. Focal hiatal hernia. Foci of coronary artery calcification again noted. Mild atherosclerotic calcification in proximal great vessels and aortic arch region. Gallbladder absent. Electronically Signed   By: Lowella Grip III M.D.   On: 04/19/2016 14:39   Ct Chest W Contrast  Result Date: 04/03/2016 CLINICAL DATA:  Shortness of breath. EXAM: CT CHEST WITH CONTRAST TECHNIQUE: Multidetector CT imaging of the chest was performed during intravenous contrast administration. CONTRAST:  7mL ISOVUE-300 IOPAMIDOL (ISOVUE-300) INJECTION 61% COMPARISON:  Radiograph February 26, 2016.  CT scan of September 09, 2011. FINDINGS: Cardiovascular: No evidence of thoracic aortic aneurysm. Normal cardiac size. No pericardial effusion. Coronary artery calcifications are noted. Mediastinum/Nodes: No enlarged mediastinal, hilar, or axillary lymph nodes. Thyroid gland, trachea, and esophagus demonstrate no significant findings. Lungs/Pleura: No pneumothorax or pleural effusion is noted. Right lung is clear. 3.0 x 1.5 cm irregular density is seen in the inferior portion of lingular segment of left upper lobe most consistent with atelectasis or infiltrate. Upper Abdomen: No acute abnormality. Musculoskeletal: No chest wall abnormality. No acute or significant osseous findings. IMPRESSION: Coronary artery calcifications are noted suggesting coronary artery disease. Irregular density seen in inferior portion of lingular segment of left upper lobe most consistent with subsegmental atelectasis or possibly pneumonia, but follow-up unenhanced chest CT scan in 10-14 days is recommended to ensure resolution and rule out underlying neoplasm or malignancy. Electronically Signed   By: Marijo Conception, M.D.   On: 04/03/2016 09:04       Today   Subjective:   Cassie Ross  Pt feels better sob improved  Objective:   Blood pressure (!) 141/68, pulse 94,  temperature 98.2 F (36.8 C), temperature source Oral, resp. rate 18, height 5\' 3"  (1.6 m), weight 204 lb 1.6 oz (92.6 kg), SpO2 91 %.  .  Intake/Output Summary (Last 24 hours) at 04/25/16 1601 Last data filed at 04/25/16 0400  Gross per 24 hour  Intake              240 ml  Output                0 ml  Net              240 ml    Exam VITAL SIGNS: Blood pressure (!) 141/68, pulse 94, temperature 98.2 F (36.8 C), temperature source Oral, resp. rate 18, height 5\' 3"  (1.6 m), weight 204 lb 1.6 oz (92.6 kg), SpO2 91 %.  GENERAL:  56 y.o.-year-old patient lying in the bed with no acute distress.  EYES: Pupils equal, round, reactive to light and accommodation. No scleral icterus. Extraocular muscles intact.  HEENT: Head atraumatic, normocephalic. Oropharynx and nasopharynx clear.  NECK:  Supple, no jugular venous distention. No thyroid enlargement, no tenderness.  LUNGS: Normal breath sounds bilaterally, no wheezing, rales,rhonchi or crepitation. No use of accessory muscles of respiration.  CARDIOVASCULAR: S1, S2 normal. No murmurs, rubs, or gallops.  ABDOMEN: Soft, nontender, nondistended. Bowel sounds present. No organomegaly or mass.  EXTREMITIES: No pedal edema, cyanosis, or clubbing.  NEUROLOGIC: Cranial nerves II through XII are intact. Muscle strength 5/5 in all extremities. Sensation intact. Gait not checked.  PSYCHIATRIC: The patient is alert and oriented x 3.  SKIN: No obvious rash, lesion, or  ulcer.   Data Review     CBC w Diff: Lab Results  Component Value Date   WBC 16.6 (H) 04/24/2016   HGB 13.1 04/24/2016   HGB 13.9 06/27/2014   HCT 38.1 04/24/2016   HCT 42.1 06/27/2014   PLT 240 04/24/2016   PLT 267 06/27/2014   LYMPHOPCT 18 02/21/2016   LYMPHOPCT 7.8 06/27/2014   MONOPCT 10 02/21/2016   MONOPCT 5.2 06/27/2014   EOSPCT 4 02/21/2016   EOSPCT 0.1 06/27/2014   BASOPCT 1 02/21/2016   BASOPCT 0.1 06/27/2014   CMP: Lab Results  Component Value Date   NA 137  04/25/2016   NA 143 06/27/2014   K 3.5 04/25/2016   K 3.5 06/27/2014   CL 96 (L) 04/25/2016   CL 103 06/27/2014   CO2 30 04/25/2016   CO2 28 06/27/2014   BUN 31 (H) 04/25/2016   BUN 17 06/27/2014   CREATININE 0.81 04/25/2016   CREATININE 0.71 06/27/2014   PROT 7.5 02/21/2016   PROT 7.1 06/26/2014   ALBUMIN 4.3 02/21/2016   ALBUMIN 4.2 06/26/2014   BILITOT 0.8 02/21/2016   BILITOT 0.7 06/26/2014   ALKPHOS 109 02/21/2016   ALKPHOS 100 06/26/2014   AST 25 02/21/2016   AST 27 06/26/2014   ALT 16 02/21/2016   ALT 21 06/26/2014  .  Micro Results Recent Results (from the past 240 hour(s))  Culture, expectorated sputum-assessment     Status: None   Collection Time: 04/24/16  1:42 PM  Result Value Ref Range Status   Specimen Description Expect. Sput  Final   Special Requests Normal  Final   Sputum evaluation THIS SPECIMEN IS ACCEPTABLE FOR SPUTUM CULTURE  Final   Report Status 04/24/2016 FINAL  Final  Culture, respiratory (NON-Expectorated)     Status: None (Preliminary result)   Collection Time: 04/24/16  1:42 PM  Result Value Ref Range Status   Specimen Description Expect. Sput  Final   Special Requests Normal Reflexed from [id number]  Final   Gram Stain   Final    ABUNDANT WBC PRESENT, PREDOMINANTLY PMN ABUNDANT GRAM POSITIVE COCCI IN PAIRS    Culture   Final    CULTURE REINCUBATED FOR BETTER GROWTH Performed at [facility], [place] [address], [place], [place] [address]    Report Status PENDING  Incomplete     Code Status History    Date Active Date Inactive Code Status Order ID Comments User Context   04/22/2016  1:19 AM 04/25/2016  3:40 PM Full Code [id number]  Lance Coon, MD ED   02/26/2016  6:41 PM 02/28/2016  5:00 PM Full Code [id number]  Gladstone Lighter, MD ED   02/21/2016 10:52 AM 02/22/2016  2:11 PM Full Code [id number]  Max Sane, MD Inpatient          Follow-up Information    Lavera Guise, MD Follow up in 1 week(s).   Specialty:  Internal  Medicine Contact information: [place] [address] [phone]           Discharge Medications   Allergies as of 04/25/2016      Reactions   Penicillins    Has patient had a PCN reaction causing immediate rash, facial/tongue/throat swelling, SOB or lightheadedness with hypotension: no Has patient had a PCN reaction causing severe rash involving mucus membranes or skin necrosis: no Has patient had a PCN reaction that required hospitalization no Has patient had a PCN reaction occurring within the last 10 years: no If all  of the above answers are "NO", then may proceed with Cephalosporin use.   Shellfish Allergy Swelling   Sunflower Oil Swelling   seeds   Levaquin [levofloxacin] Rash   Allergy to pill form only. Patient has had IV with no problem.      Medication List    STOP taking these medications   azithromycin 250 MG tablet Commonly known as:  ZITHROMAX   predniSONE 20 MG tablet Commonly known as:  DELTASONE Replaced by:  predniSONE 10 MG (21) Tbpk tablet     TAKE these medications   albuterol 108 (90 Base) MCG/ACT inhaler Commonly known as:  PROVENTIL HFA;VENTOLIN HFA Inhale 1 puff into the lungs as needed for wheezing or shortness of breath.   albuterol (2.5 MG/3ML) 0.083% nebulizer solution Commonly known as:  PROVENTIL Take 2.5 mg by nebulization every 4 (four) hours as needed for wheezing or shortness of breath.   ALPRAZolam 0.5 MG tablet Commonly known as:  XANAX Take 0.5-1 mg by mouth 2 (two) times daily.   aspirin EC 81 MG tablet Take 81 mg by mouth daily.   atorvastatin 10 MG tablet Commonly known as:  LIPITOR Take 10 mg by mouth daily.   budesonide-formoterol 160-4.5 MCG/ACT inhaler Commonly known as:  SYMBICORT Inhale 1 puff into the lungs 2 (two) times daily.   CALCIUM + D PO Take 2 tablets by mouth 2 (two) times daily.   diltiazem 180 MG 24 hr capsule Commonly known as:  CARDIZEM CD Take 180 mg by mouth 2 (two) times  daily.   furosemide 40 MG tablet Commonly known as:  LASIX Take 40 mg by mouth 2 (two) times daily.   guaiFENesin-dextromethorphan 100-10 MG/5ML syrup Commonly known as:  ROBITUSSIN DM Take 5 mLs by mouth every 4 (four) hours as needed for cough.   insulin lispro 100 UNIT/ML injection Commonly known as:  HUMALOG Inject 2-10 Units into the skin 3 (three) times daily before meals. Based on sliding scale. For BS of 150-200, take 2 units, 201-250, take 4 units, 251-300, take 6 units, 301-350 take 8 units, 351-400 take 10 units. Over 400 contact physician.   loratadine 10 MG tablet Commonly known as:  CLARITIN Take 10 mg by mouth daily.   montelukast 10 MG tablet Commonly known as:  SINGULAIR Take 10 mg by mouth at bedtime.   ONE-A-DAY MENOPAUSE FORMULA PO Take 1 tablet by mouth daily.   oseltamivir 75 MG capsule Commonly known as:  TAMIFLU Take 1 capsule (75 mg total) by mouth 2 (two) times daily.   potassium chloride 10 MEQ tablet Commonly known as:  K-DUR,KLOR-CON Take 10 mEq by mouth 2 (two) times daily.   predniSONE 10 MG (21) Tbpk tablet Commonly known as:  STERAPRED UNI-PAK 21 TAB Take 1 tablet (10 mg total) by mouth daily. Start at 60mg  Taper by 10mg  until complete Replaces:  predniSONE 20 MG tablet   theophylline 300 MG 12 hr tablet Commonly known as:  THEODUR Take 300 mg by mouth daily.          Total Time in preparing paper work, data evaluation and todays exam - 35 minutes  Dustin Flock M.D on 04/25/2016 at 4:01 PM  [facility]   Office  [phone]

## 2016-04-27 LAB — CULTURE, RESPIRATORY: SPECIAL REQUESTS: NORMAL

## 2016-04-27 LAB — CULTURE, RESPIRATORY W GRAM STAIN

## 2016-04-29 DIAGNOSIS — J181 Lobar pneumonia, unspecified organism: Secondary | ICD-10-CM | POA: Diagnosis not present

## 2016-04-29 DIAGNOSIS — J4550 Severe persistent asthma, uncomplicated: Secondary | ICD-10-CM | POA: Diagnosis not present

## 2016-04-29 DIAGNOSIS — J9621 Acute and chronic respiratory failure with hypoxia: Secondary | ICD-10-CM | POA: Diagnosis not present

## 2016-05-13 DIAGNOSIS — H3554 Dystrophies primarily involving the retinal pigment epithelium: Secondary | ICD-10-CM | POA: Diagnosis not present

## 2016-05-16 ENCOUNTER — Other Ambulatory Visit: Payer: Self-pay | Admitting: Internal Medicine

## 2016-05-16 DIAGNOSIS — J181 Lobar pneumonia, unspecified organism: Secondary | ICD-10-CM

## 2016-05-21 ENCOUNTER — Ambulatory Visit
Admission: RE | Admit: 2016-05-21 | Discharge: 2016-05-21 | Disposition: A | Discharge status: Home / Self Care | Payer: Medicare HMO | Source: Ambulatory Visit | Attending: Internal Medicine | Admitting: Internal Medicine

## 2016-05-21 DIAGNOSIS — I700 Atherosclerosis of aorta: Secondary | ICD-10-CM | POA: Diagnosis not present

## 2016-05-21 DIAGNOSIS — J181 Lobar pneumonia, unspecified organism: Secondary | ICD-10-CM | POA: Diagnosis not present

## 2016-05-21 DIAGNOSIS — I2510 Atherosclerotic heart disease of native coronary artery without angina pectoris: Secondary | ICD-10-CM | POA: Insufficient documentation

## 2016-05-21 DIAGNOSIS — R918 Other nonspecific abnormal finding of lung field: Secondary | ICD-10-CM | POA: Insufficient documentation

## 2016-05-21 MED ORDER — IOPAMIDOL (ISOVUE-300) INJECTION 61%
75.0000 mL | Freq: Once | INTRAVENOUS | Status: AC | PRN
  Administered 2016-05-21: 75 mL via INTRAVENOUS

## 2016-05-22 DIAGNOSIS — H541213 Low vision right eye category 1, blindness left eye category 3: Secondary | ICD-10-CM | POA: Diagnosis not present

## 2016-05-27 DIAGNOSIS — I5042 Chronic combined systolic (congestive) and diastolic (congestive) heart failure: Secondary | ICD-10-CM | POA: Diagnosis not present

## 2016-05-27 DIAGNOSIS — J9620 Acute and chronic respiratory failure, unspecified whether with hypoxia or hypercapnia: Secondary | ICD-10-CM | POA: Diagnosis not present

## 2016-05-27 DIAGNOSIS — J181 Lobar pneumonia, unspecified organism: Secondary | ICD-10-CM | POA: Diagnosis not present

## 2016-05-27 DIAGNOSIS — R911 Solitary pulmonary nodule: Secondary | ICD-10-CM | POA: Diagnosis not present

## 2016-05-30 ENCOUNTER — Other Ambulatory Visit: Payer: Self-pay | Admitting: Internal Medicine

## 2016-05-30 ENCOUNTER — Other Ambulatory Visit: Payer: Self-pay | Admitting: Neurosurgery

## 2016-05-30 DIAGNOSIS — E1165 Type 2 diabetes mellitus with hyperglycemia: Secondary | ICD-10-CM | POA: Diagnosis not present

## 2016-05-30 DIAGNOSIS — E6609 Other obesity due to excess calories: Secondary | ICD-10-CM | POA: Diagnosis not present

## 2016-05-30 DIAGNOSIS — J181 Lobar pneumonia, unspecified organism: Secondary | ICD-10-CM | POA: Diagnosis not present

## 2016-05-30 DIAGNOSIS — Z1231 Encounter for screening mammogram for malignant neoplasm of breast: Secondary | ICD-10-CM

## 2016-07-10 ENCOUNTER — Ambulatory Visit
Admission: RE | Admit: 2016-07-10 | Discharge: 2016-07-10 | Disposition: A | Discharge status: Home / Self Care | Payer: Medicare HMO | Source: Ambulatory Visit | Attending: Internal Medicine | Admitting: Internal Medicine

## 2016-07-10 DIAGNOSIS — Z1231 Encounter for screening mammogram for malignant neoplasm of breast: Secondary | ICD-10-CM | POA: Diagnosis not present

## 2016-07-15 ENCOUNTER — Inpatient Hospital Stay
Admission: RE | Admit: 2016-07-15 | Discharge: 2016-07-15 | Disposition: A | Discharge status: Home / Self Care | Payer: Self-pay | Source: Ambulatory Visit | Attending: *Deleted | Admitting: *Deleted

## 2016-07-15 ENCOUNTER — Other Ambulatory Visit: Payer: Self-pay | Admitting: *Deleted

## 2016-07-15 DIAGNOSIS — Z9289 Personal history of other medical treatment: Secondary | ICD-10-CM

## 2016-07-23 DIAGNOSIS — E1165 Type 2 diabetes mellitus with hyperglycemia: Secondary | ICD-10-CM | POA: Diagnosis not present

## 2016-07-23 DIAGNOSIS — Z0001 Encounter for general adult medical examination with abnormal findings: Secondary | ICD-10-CM | POA: Diagnosis not present

## 2016-07-23 DIAGNOSIS — I5042 Chronic combined systolic (congestive) and diastolic (congestive) heart failure: Secondary | ICD-10-CM | POA: Diagnosis not present

## 2016-07-23 DIAGNOSIS — J4551 Severe persistent asthma with (acute) exacerbation: Secondary | ICD-10-CM | POA: Diagnosis not present

## 2016-07-23 DIAGNOSIS — I10 Essential (primary) hypertension: Secondary | ICD-10-CM | POA: Diagnosis not present

## 2016-08-13 ENCOUNTER — Other Ambulatory Visit: Payer: Self-pay | Admitting: Internal Medicine

## 2016-08-13 DIAGNOSIS — J189 Pneumonia, unspecified organism: Secondary | ICD-10-CM

## 2016-08-19 ENCOUNTER — Ambulatory Visit
Admission: RE | Admit: 2016-08-19 | Discharge: 2016-08-19 | Disposition: A | Discharge status: Home / Self Care | Payer: Medicare HMO | Source: Ambulatory Visit | Attending: Internal Medicine | Admitting: Internal Medicine

## 2016-08-19 DIAGNOSIS — J189 Pneumonia, unspecified organism: Secondary | ICD-10-CM | POA: Insufficient documentation

## 2016-08-19 DIAGNOSIS — R918 Other nonspecific abnormal finding of lung field: Secondary | ICD-10-CM | POA: Insufficient documentation

## 2016-08-19 DIAGNOSIS — R05 Cough: Secondary | ICD-10-CM | POA: Diagnosis not present

## 2016-08-19 DIAGNOSIS — I2510 Atherosclerotic heart disease of native coronary artery without angina pectoris: Secondary | ICD-10-CM | POA: Diagnosis not present

## 2016-08-19 LAB — POCT I-STAT CREATININE: Creatinine, Ser: 0.7 mg/dL (ref 0.44–1.00)

## 2016-08-19 MED ORDER — IOPAMIDOL (ISOVUE-300) INJECTION 61%
75.0000 mL | Freq: Once | INTRAVENOUS | Status: AC | PRN
  Administered 2016-08-19: 75 mL via INTRAVENOUS

## 2016-09-05 DIAGNOSIS — R0602 Shortness of breath: Secondary | ICD-10-CM | POA: Diagnosis not present

## 2016-09-05 DIAGNOSIS — E1165 Type 2 diabetes mellitus with hyperglycemia: Secondary | ICD-10-CM | POA: Diagnosis not present

## 2016-09-05 DIAGNOSIS — I2584 Coronary atherosclerosis due to calcified coronary lesion: Secondary | ICD-10-CM | POA: Diagnosis not present

## 2016-09-05 DIAGNOSIS — J181 Lobar pneumonia, unspecified organism: Secondary | ICD-10-CM | POA: Diagnosis not present

## 2016-09-05 DIAGNOSIS — R160 Hepatomegaly, not elsewhere classified: Secondary | ICD-10-CM | POA: Diagnosis not present

## 2016-09-05 DIAGNOSIS — R911 Solitary pulmonary nodule: Secondary | ICD-10-CM | POA: Diagnosis not present

## 2016-09-05 DIAGNOSIS — J449 Chronic obstructive pulmonary disease, unspecified: Secondary | ICD-10-CM | POA: Diagnosis not present

## 2016-09-10 DIAGNOSIS — J4550 Severe persistent asthma, uncomplicated: Secondary | ICD-10-CM | POA: Diagnosis not present

## 2016-09-10 DIAGNOSIS — E782 Mixed hyperlipidemia: Secondary | ICD-10-CM | POA: Diagnosis not present

## 2016-09-10 DIAGNOSIS — E1165 Type 2 diabetes mellitus with hyperglycemia: Secondary | ICD-10-CM | POA: Diagnosis not present

## 2016-10-14 DIAGNOSIS — R160 Hepatomegaly, not elsewhere classified: Secondary | ICD-10-CM | POA: Diagnosis not present

## 2016-10-21 DIAGNOSIS — R0602 Shortness of breath: Secondary | ICD-10-CM | POA: Diagnosis not present

## 2016-11-12 DIAGNOSIS — H3554 Dystrophies primarily involving the retinal pigment epithelium: Secondary | ICD-10-CM | POA: Diagnosis not present

## 2016-11-15 DIAGNOSIS — E1165 Type 2 diabetes mellitus with hyperglycemia: Secondary | ICD-10-CM | POA: Diagnosis not present

## 2016-11-15 DIAGNOSIS — J441 Chronic obstructive pulmonary disease with (acute) exacerbation: Secondary | ICD-10-CM | POA: Diagnosis not present

## 2016-11-15 DIAGNOSIS — J4550 Severe persistent asthma, uncomplicated: Secondary | ICD-10-CM | POA: Diagnosis not present

## 2016-11-19 DIAGNOSIS — H26492 Other secondary cataract, left eye: Secondary | ICD-10-CM | POA: Diagnosis not present

## 2017-01-06 DIAGNOSIS — J449 Chronic obstructive pulmonary disease, unspecified: Secondary | ICD-10-CM | POA: Diagnosis not present

## 2017-01-06 DIAGNOSIS — J4550 Severe persistent asthma, uncomplicated: Secondary | ICD-10-CM | POA: Diagnosis not present

## 2017-01-06 DIAGNOSIS — J301 Allergic rhinitis due to pollen: Secondary | ICD-10-CM | POA: Diagnosis not present

## 2017-01-06 DIAGNOSIS — K219 Gastro-esophageal reflux disease without esophagitis: Secondary | ICD-10-CM | POA: Diagnosis not present

## 2017-02-13 DIAGNOSIS — F1320 Sedative, hypnotic or anxiolytic dependence, uncomplicated: Secondary | ICD-10-CM | POA: Diagnosis not present

## 2017-02-13 DIAGNOSIS — K219 Gastro-esophageal reflux disease without esophagitis: Secondary | ICD-10-CM | POA: Diagnosis not present

## 2017-02-13 DIAGNOSIS — E039 Hypothyroidism, unspecified: Secondary | ICD-10-CM | POA: Diagnosis not present

## 2017-02-13 DIAGNOSIS — E782 Mixed hyperlipidemia: Secondary | ICD-10-CM | POA: Diagnosis not present

## 2017-02-13 DIAGNOSIS — I10 Essential (primary) hypertension: Secondary | ICD-10-CM | POA: Diagnosis not present

## 2017-02-13 DIAGNOSIS — E1165 Type 2 diabetes mellitus with hyperglycemia: Secondary | ICD-10-CM | POA: Diagnosis not present

## 2017-02-13 DIAGNOSIS — J4551 Severe persistent asthma with (acute) exacerbation: Secondary | ICD-10-CM | POA: Diagnosis not present

## 2017-03-19 ENCOUNTER — Emergency Department: Payer: PPO

## 2017-03-19 ENCOUNTER — Other Ambulatory Visit: Payer: Self-pay

## 2017-03-19 ENCOUNTER — Encounter: Payer: Self-pay | Admitting: *Deleted

## 2017-03-19 ENCOUNTER — Inpatient Hospital Stay
Admission: EM | Admit: 2017-03-19 | Discharge: 2017-03-20 | DRG: 190 | Disposition: A | Discharge status: Home / Self Care | Payer: PPO | Attending: Internal Medicine | Admitting: Internal Medicine

## 2017-03-19 DIAGNOSIS — Z880 Allergy status to penicillin: Secondary | ICD-10-CM

## 2017-03-19 DIAGNOSIS — J9601 Acute respiratory failure with hypoxia: Secondary | ICD-10-CM | POA: Diagnosis not present

## 2017-03-19 DIAGNOSIS — J45901 Unspecified asthma with (acute) exacerbation: Secondary | ICD-10-CM | POA: Diagnosis not present

## 2017-03-19 DIAGNOSIS — Z79899 Other long term (current) drug therapy: Secondary | ICD-10-CM

## 2017-03-19 DIAGNOSIS — Z794 Long term (current) use of insulin: Secondary | ICD-10-CM | POA: Diagnosis not present

## 2017-03-19 DIAGNOSIS — R05 Cough: Secondary | ICD-10-CM | POA: Diagnosis not present

## 2017-03-19 DIAGNOSIS — K219 Gastro-esophageal reflux disease without esophagitis: Secondary | ICD-10-CM | POA: Diagnosis present

## 2017-03-19 DIAGNOSIS — Z7951 Long term (current) use of inhaled steroids: Secondary | ICD-10-CM | POA: Diagnosis not present

## 2017-03-19 DIAGNOSIS — I509 Heart failure, unspecified: Secondary | ICD-10-CM | POA: Diagnosis not present

## 2017-03-19 DIAGNOSIS — I10 Essential (primary) hypertension: Secondary | ICD-10-CM | POA: Diagnosis present

## 2017-03-19 DIAGNOSIS — J4551 Severe persistent asthma with (acute) exacerbation: Secondary | ICD-10-CM | POA: Diagnosis present

## 2017-03-19 DIAGNOSIS — R0602 Shortness of breath: Secondary | ICD-10-CM | POA: Diagnosis not present

## 2017-03-19 DIAGNOSIS — E119 Type 2 diabetes mellitus without complications: Secondary | ICD-10-CM | POA: Diagnosis not present

## 2017-03-19 DIAGNOSIS — I110 Hypertensive heart disease with heart failure: Secondary | ICD-10-CM | POA: Diagnosis present

## 2017-03-19 DIAGNOSIS — R062 Wheezing: Secondary | ICD-10-CM

## 2017-03-19 DIAGNOSIS — Z881 Allergy status to other antibiotic agents status: Secondary | ICD-10-CM | POA: Diagnosis not present

## 2017-03-19 DIAGNOSIS — Z7982 Long term (current) use of aspirin: Secondary | ICD-10-CM | POA: Diagnosis not present

## 2017-03-19 DIAGNOSIS — J441 Chronic obstructive pulmonary disease with (acute) exacerbation: Secondary | ICD-10-CM | POA: Diagnosis not present

## 2017-03-19 LAB — BLOOD GAS, VENOUS
Acid-Base Excess: 3.1 mmol/L — ABNORMAL HIGH (ref 0.0–2.0)
BICARBONATE: 27.9 mmol/L (ref 20.0–28.0)
O2 Saturation: 99 %
PATIENT TEMPERATURE: 37
PO2 VEN: 130 mmHg — AB (ref 32.0–45.0)
pCO2, Ven: 42 mmHg — ABNORMAL LOW (ref 44.0–60.0)
pH, Ven: 7.43 (ref 7.250–7.430)

## 2017-03-19 LAB — BASIC METABOLIC PANEL
Anion gap: 11 (ref 5–15)
BUN: 17 mg/dL (ref 6–20)
CHLORIDE: 103 mmol/L (ref 101–111)
CO2: 25 mmol/L (ref 22–32)
Calcium: 9.1 mg/dL (ref 8.9–10.3)
Creatinine, Ser: 0.74 mg/dL (ref 0.44–1.00)
GFR calc Af Amer: >60 mL/min (ref >60)
GFR calc non Af Amer: >60 mL/min (ref >60)
Glucose, Bld: 122 mg/dL — ABNORMAL HIGH (ref 65–99)
POTASSIUM: 4.1 mmol/L (ref 3.5–5.1)
SODIUM: 139 mmol/L (ref 135–145)

## 2017-03-19 LAB — CBC
HCT: 44.7 % (ref 35.0–47.0)
HEMOGLOBIN: 14.8 g/dL (ref 12.0–16.0)
MCH: 30.4 pg (ref 26.0–34.0)
MCHC: 33.1 g/dL (ref 32.0–36.0)
MCV: 91.8 fL (ref 80.0–100.0)
Platelets: 285 10*3/uL (ref 150–440)
RBC: 4.87 MIL/uL (ref 3.80–5.20)
RDW: 14 % (ref 11.5–14.5)
WBC: 12.5 10*3/uL — ABNORMAL HIGH (ref 3.6–11.0)

## 2017-03-19 LAB — INFLUENZA PANEL BY PCR (TYPE A & B)
INFLBPCR: NEGATIVE
Influenza A By PCR: NEGATIVE

## 2017-03-19 LAB — TROPONIN I

## 2017-03-19 MED ORDER — MAGNESIUM SULFATE 2 GM/50ML IV SOLN
2.0000 g | Freq: Once | INTRAVENOUS | Status: AC
  Administered 2017-03-19: 2 g via INTRAVENOUS
  Filled 2017-03-19: qty 50

## 2017-03-19 MED ORDER — METHYLPREDNISOLONE SODIUM SUCC 125 MG IJ SOLR
125.0000 mg | Freq: Once | INTRAMUSCULAR | Status: AC
  Administered 2017-03-19: 125 mg via INTRAVENOUS
  Filled 2017-03-19: qty 2

## 2017-03-19 MED ORDER — LEVOFLOXACIN IN D5W 750 MG/150ML IV SOLN
750.0000 mg | Freq: Once | INTRAVENOUS | Status: AC
  Administered 2017-03-19: 750 mg via INTRAVENOUS
  Filled 2017-03-19: qty 150

## 2017-03-19 MED ORDER — IPRATROPIUM-ALBUTEROL 0.5-2.5 (3) MG/3ML IN SOLN
RESPIRATORY_TRACT | Status: AC
  Filled 2017-03-19: qty 3

## 2017-03-19 MED ORDER — ALBUTEROL SULFATE (2.5 MG/3ML) 0.083% IN NEBU
5.0000 mg | INHALATION_SOLUTION | Freq: Once | RESPIRATORY_TRACT | Status: AC
  Administered 2017-03-19: 5 mg via RESPIRATORY_TRACT
  Filled 2017-03-19: qty 6

## 2017-03-19 NOTE — ED Notes (Signed)
Bipap in place per RT with svn in progress. Sats up to 98% pt states her breathing is improving. CXR at bedside, IV started with blood sent to lab.

## 2017-03-19 NOTE — H&P (Signed)
[facility] [place] NAME: Cassie Ross    MR#:  [id number]  DATE OF BIRTH:  12-15-1960  DATE OF ADMISSION:  03/19/2017  PRIMARY CARE PHYSICIAN: Cassie Guise, MD   REQUESTING/REFERRING PHYSICIAN: Joni Fears, MD  CHIEF COMPLAINT:   Chief Complaint  Patient presents with  . Shortness of Breath    HISTORY OF PRESENT ILLNESS:  Cassie Ross  is a 57 y.o. female who presents with shortness of breath, wheezing, cough.  Patient states that 2 weeks ago she was having similar symptoms and took a course of prednisone and azithromycin.  She felt better for about a day, and then her symptoms progressed again.  She was put on another course of prednisone and doxycycline, but this time her symptoms did not improve.  She came to the ED today for evaluation and required short course of BiPAP here in the ED, but was able to quickly wean off.  Given her worsening symptoms despite recent pharmacotherapy hospitalist were called for admission and further treatment  PAST MEDICAL HISTORY:   Past Medical History:  Diagnosis Date  . Asthma   . CHF (congestive heart failure) ([place])    1991- unknown after asthma attack  . COPD (chronic obstructive pulmonary disease) ([place])   . Diabetes ([place])   . GERD (gastroesophageal reflux disease)   . Hypertension   . Hypertension     PAST SURGICAL HISTORY:   Past Surgical History:  Procedure Laterality Date  . CESAREAN SECTION    . CHOLECYSTECTOMY    . ESOPHAGEAL DILATION    . HERNIA REPAIR    . NASAL SINUS SURGERY      SOCIAL HISTORY:   Social History   Tobacco Use  . Smoking status: Never Smoker  . Smokeless tobacco: Never Used  Substance Use Topics  . Alcohol use: No    FAMILY HISTORY:   Family History  Problem Relation Age of Onset  . Hypertension Mother     DRUG ALLERGIES:   Allergies  Allergen Reactions  . Penicillins     Has patient had a PCN reaction causing immediate rash,  facial/tongue/throat swelling, SOB or lightheadedness with hypotension: no Has patient had a PCN reaction causing severe rash involving mucus membranes or skin necrosis: no Has patient had a PCN reaction that required hospitalization no Has patient had a PCN reaction occurring within the last 10 years: no If all of the above answers are "NO", then may proceed with Cephalosporin use.   . Shellfish Allergy Swelling  . Sunflower Oil Swelling    seeds  . Levaquin [Levofloxacin] Rash    Allergy to pill form only. Patient has had IV with no problem.    MEDICATIONS AT HOME:   Prior to Admission medications   Medication Sig Start Date End Date Taking? Authorizing Provider  albuterol (PROVENTIL HFA;VENTOLIN HFA) 108 (90 Base) MCG/ACT inhaler Inhale 1 puff into the lungs as needed for wheezing or shortness of breath.   Yes [provider]  albuterol (PROVENTIL) (2.5 MG/3ML) 0.083% nebulizer solution Take 2.5 mg by nebulization every 4 (four) hours as needed for wheezing or shortness of breath.   Yes [provider]  ALPRAZolam Duanne Moron) 0.5 MG tablet Take 0.5-1 mg by mouth 2 (two) times daily.   Yes [provider]  aspirin EC 81 MG tablet Take 81 mg by mouth daily.   Yes [provider]  atorvastatin (LIPITOR) 10 MG tablet Take 10 mg by mouth daily.  Yes [provider]  budesonide-formoterol (SYMBICORT) 160-4.5 MCG/ACT inhaler Inhale 1 puff into the lungs 2 (two) times daily.   Yes [provider]  Calcium Citrate-Vitamin D (CALCIUM + D PO) Take 2 tablets by mouth 2 (two) times daily.   Yes [provider]  Canagliflozin-Metformin HCl (INVOKAMET) 150-500 MG TABS Take 1 tablet by mouth at bedtime.   Yes [provider]  Chlorpheniramine-APAP (CORICIDIN) 2-325 MG TABS Take 2 tablets by mouth daily as needed.   Yes [provider]  diltiazem (CARDIZEM CD) 180 MG 24 hr capsule Take 180 mg by mouth 2 (two) times daily.   Yes  [provider]  doxycycline (VIBRA-TABS) 100 MG tablet Take 100 mg by mouth 2 (two) times daily. For 14 days. (On day 3)   Yes [provider]  furosemide (LASIX) 40 MG tablet Take 40 mg by mouth daily.    Yes [provider]  insulin lispro (HUMALOG) 100 UNIT/ML injection Inject 2-10 Units into the skin 3 (three) times daily before meals. Based on sliding scale. For BS of 150-200, take 2 units, 201-250, take 4 units, 251-300, take 6 units, 301-350 take 8 units, 351-400 take 10 units. Over 400 contact physician.   Yes [provider]  loratadine (CLARITIN) 10 MG tablet Take 10 mg by mouth daily.   Yes [provider]  montelukast (SINGULAIR) 10 MG tablet Take 10 mg by mouth at bedtime.    Yes [provider]  Multiple Vitamins-Minerals (ONE-A-DAY MENOPAUSE FORMULA PO) Take 1 tablet by mouth daily.   Yes [provider]  potassium chloride (K-DUR,KLOR-CON) 10 MEQ tablet Take 10 mEq by mouth 2 (two) times daily.   Yes [provider]  predniSONE (DELTASONE) 20 MG tablet Take 20 mg by mouth daily with breakfast. Pt tapers on her own depending on breathing. Currently 60 mg for 4 days and taper slowly down by 10 mg every 4 days. On day 3 of 60 mg taper.   Yes [provider]  predniSONE (STERAPRED UNI-PAK 21 TAB) 10 MG (21) TBPK tablet Take 1 tablet (10 mg total) by mouth daily. Start at 60mg  Taper by 10mg  until complete 04/25/16  Yes Dustin Flock, MD  theophylline (THEODUR) 300 MG 12 hr tablet Take 300 mg by mouth daily.    Yes [provider]  guaiFENesin-dextromethorphan (ROBITUSSIN DM) 100-10 MG/5ML syrup Take 5 mLs by mouth every 4 (four) hours as needed for cough. Patient not taking: Reported on 03/19/2017 04/25/16   Dustin Flock, MD    REVIEW OF SYSTEMS:  Review of Systems  Constitutional: Negative for chills, fever, malaise/fatigue and weight loss.  HENT: Negative for ear pain, hearing loss and tinnitus.    Eyes: Negative for blurred vision, double vision, pain and redness.  Respiratory: Positive for cough, shortness of breath and wheezing. Negative for hemoptysis.   Cardiovascular: Negative for chest pain, palpitations, orthopnea and leg swelling.  Gastrointestinal: Negative for abdominal pain, constipation, diarrhea, nausea and vomiting.  Genitourinary: Negative for dysuria, frequency and hematuria.  Musculoskeletal: Negative for back pain, joint pain and neck pain.  Skin:       No acne, rash, or lesions  Neurological: Negative for dizziness, tremors, focal weakness and weakness.  Endo/Heme/Allergies: Negative for polydipsia. Does not bruise/bleed easily.  Psychiatric/Behavioral: Negative for depression. The patient is not nervous/anxious and does not have insomnia.      VITAL SIGNS:   Vitals:   03/19/17 2249 03/19/17 2300 03/19/17 2315 03/19/17 2330  BP:  131/68 134/71  (!) 127/55  Pulse: (!) 108 (!) 108 (!) 109 (!) 106  Resp: 19 (!) 23 19 15   Temp:      SpO2: 94% 91% 93% 92%  Weight:      Height:       Wt Readings from Last 3 Encounters:  03/19/17 90.7 kg (200 lb)  04/25/16 92.6 kg (204 lb 1.6 oz)  02/26/16 90.7 kg (200 lb)    PHYSICAL EXAMINATION:  Physical Exam  Vitals reviewed. Constitutional: She is oriented to person, place, and time. She appears well-developed and well-nourished. No distress.  HENT:  Head: Normocephalic and atraumatic.  Mouth/Throat: Oropharynx is clear and moist.  Eyes: Conjunctivae and EOM are normal. Pupils are equal, round, and reactive to light. No scleral icterus.  Neck: Normal range of motion. Neck supple. No JVD present. No thyromegaly present.  Cardiovascular: Regular rhythm and intact distal pulses. Exam reveals no gallop and no friction rub.  No murmur heard. Mild tachycardia  Respiratory: Effort normal. No respiratory distress. She has wheezes. She has no rales.  GI: Soft. Bowel sounds are normal. She exhibits no distension. There is  no tenderness.  Musculoskeletal: Normal range of motion. She exhibits no edema.  No arthritis, no gout  Lymphadenopathy:    She has no cervical adenopathy.  Neurological: She is alert and oriented to person, place, and time. No cranial nerve deficit.  No dysarthria, no aphasia  Skin: Skin is warm and dry. No rash noted. No erythema.  Psychiatric: She has a normal mood and affect. Her behavior is normal. Judgment and thought content normal.    LABORATORY PANEL:   CBC Recent Labs  Lab 03/19/17 2118  WBC 12.5*  HGB 14.8  HCT 44.7  PLT 285   ------------------------------------------------------------------------------------------------------------------  Chemistries  Recent Labs  Lab 03/19/17 2118  NA 139  K 4.1  CL 103  CO2 25  GLUCOSE 122*  BUN 17  CREATININE 0.74  CALCIUM 9.1   ------------------------------------------------------------------------------------------------------------------  Cardiac Enzymes Recent Labs  Lab 03/19/17 2118  TROPONINI <0.03   ------------------------------------------------------------------------------------------------------------------  RADIOLOGY:  Dg Chest Port 1 View  Result Date: 03/19/2017 CLINICAL DATA:  57 year old female with shortness of breath. EXAM: PORTABLE CHEST 1 VIEW COMPARISON:  Chest CT dated 08/19/2016 FINDINGS: The heart size and mediastinal contours are within normal limits. Both lungs are clear. The visualized skeletal structures are unremarkable. IMPRESSION: No active disease. Electronically Signed   By: Anner Crete M.D.   On: 03/19/2017 21:43    EKG:   Orders placed or performed during the hospital encounter of 03/19/17  . ED EKG  . ED EKG  . EKG 12-Lead  . EKG 12-Lead    IMPRESSION AND PLAN:  Principal Problem:   Asthma exacerbation -IV Solu-Medrol, scheduled and as needed nebs, PRN antitussive, Levaquin.  Patient would likely benefit from an extended prednisone on taper upon discharge  -possibly up to 10 days Active Problems:   HTN (hypertension) -continue home meds   Diabetes ([place]) -sliding scale insulin with corresponding glucose checks   GERD (gastroesophageal reflux disease) -not on scheduled medication for this, treat as needed  All the records are reviewed and case discussed with ED provider. Management plans discussed with the patient and/or family.  DVT PROPHYLAXIS: SubQ lovenox  GI PROPHYLAXIS: None  ADMISSION STATUS: Inpatient  CODE STATUS: Full Code Status History    Date Active Date Inactive Code Status Order ID Comments User Context   04/22/2016 01:19 04/25/2016 15:40 Full Code [id number]  Jannifer [place],  Shanon Brow, MD ED   02/26/2016 18:41 02/28/2016 17:00 Full Code [id number]  Gladstone Lighter, MD ED   02/21/2016 10:52 02/22/2016 14:11 Full Code [id number]  Max Sane, MD Inpatient      TOTAL TIME TAKING CARE OF THIS PATIENT: 45 minutes.   Jannifer [place], Daymeon Fischman [place] 03/19/2017, 11:53 PM  [facility]  Office  [phone]  CC: Primary care physician; Cassie Guise, MD  Note:  This document was prepared using Dragon voice recognition software and may include unintentional dictation errors.

## 2017-03-19 NOTE — ED Notes (Signed)
Pt remains on room air at this time.

## 2017-03-19 NOTE — ED Triage Notes (Signed)
Pt to triage via wheelchair. Pt reports sob.  Pt wheezing with tightness in chest.  Pt alert.

## 2017-03-19 NOTE — ED Notes (Addendum)
Admitting Provider at bedside. 

## 2017-03-19 NOTE — ED Provider Notes (Signed)
[facility] Emergency Department Provider Note  ____________________________________________  Time seen: Approximately 10:15 PM  I have reviewed the triage vital signs and the nursing notes.   HISTORY  Chief Complaint Shortness of Breath    HPI Cassie Ross is a 57 y.o. female who complains of sudden onset shortness of breath and cough and hour prior to arrival. She's been on doxycycline and prednisone for COPD exacerbation for the past 3 days without resolution of symptoms. Denies chest pain. No fevers chills or sweats. No aggravating or alleviating factors. Symptoms are severe and constant.     Past Medical History:  Diagnosis Date  . Asthma   . CHF (congestive heart failure) ([place])    1991- unknown after asthma attack  . COPD (chronic obstructive pulmonary disease) ([place])   . Diabetes ([place])   . GERD (gastroesophageal reflux disease)   . Hypertension   . Hypertension      Patient Active Problem List   Diagnosis Date Noted  . Influenza A 04/22/2016  . HTN (hypertension) 04/21/2016  . Diabetes ([place]) 04/21/2016  . GERD (gastroesophageal reflux disease) 04/21/2016  . Pneumonia 02/26/2016  . Asthma exacerbation 02/21/2016     Past Surgical History:  Procedure Laterality Date  . CESAREAN SECTION    . CHOLECYSTECTOMY    . ESOPHAGEAL DILATION    . HERNIA REPAIR    . NASAL SINUS SURGERY       Prior to Admission medications   Medication Sig Start Date End Date Taking? Authorizing Provider  albuterol (PROVENTIL HFA;VENTOLIN HFA) 108 (90 Base) MCG/ACT inhaler Inhale 1 puff into the lungs as needed for wheezing or shortness of breath.    [provider]  albuterol (PROVENTIL) (2.5 MG/3ML) 0.083% nebulizer solution Take 2.5 mg by nebulization every 4 (four) hours as needed for wheezing or shortness of breath.    [provider]  ALPRAZolam Duanne Moron) 0.5 MG tablet Take 0.5-1 mg by mouth 2 (two) times daily.    [provider]  aspirin EC 81 MG tablet Take 81 mg by mouth daily.    [provider]  atorvastatin (LIPITOR) 10 MG tablet Take 10 mg by mouth daily.    [provider]  budesonide-formoterol (SYMBICORT) 160-4.5 MCG/ACT inhaler Inhale 1 puff into the lungs 2 (two) times daily.    [provider]  Calcium Citrate-Vitamin D (CALCIUM + D PO) Take 2 tablets by mouth 2 (two) times daily.    [provider]  diltiazem (CARDIZEM CD) 180 MG 24 hr capsule Take 180 mg by mouth 2 (two) times daily.    [provider]  furosemide (LASIX) 40 MG tablet Take 40 mg by mouth 2 (two) times daily.     [provider]  guaiFENesin-dextromethorphan (ROBITUSSIN DM) 100-10 MG/5ML syrup Take 5 mLs by mouth every 4 (four) hours as needed for cough. 04/25/16   Dustin Flock, MD  insulin lispro (HUMALOG) 100 UNIT/ML injection Inject 2-10 Units into the skin 3 (three) times daily before meals. Based on sliding scale. For BS of 150-200, take 2 units, 201-250, take 4 units, 251-300, take 6 units, 301-350 take 8 units, 351-400 take 10 units. Over 400 contact physician.    [provider]  loratadine (CLARITIN) 10 MG tablet Take 10 mg by mouth daily.    [provider]  montelukast (SINGULAIR) 10 MG tablet Take 10 mg by mouth at bedtime.     [provider]  Multiple Vitamins-Minerals (ONE-A-DAY MENOPAUSE FORMULA PO) Take  1 tablet by mouth daily.    [provider]  potassium chloride (K-DUR,KLOR-CON) 10 MEQ tablet Take 10 mEq by mouth 2 (two) times daily.    [provider]  predniSONE (STERAPRED UNI-PAK 21 TAB) 10 MG (21) TBPK tablet Take 1 tablet (10 mg total) by mouth daily. Start at 60mg  Taper by 10mg  until complete 04/25/16   Dustin Flock, MD  theophylline (THEODUR) 300 MG 12 hr tablet Take 300 mg by mouth daily.     [provider]     Allergies Penicillins; Shellfish allergy; Sunflower oil; and Levaquin  [levofloxacin]   Family History  Problem Relation Age of Onset  . Hypertension Mother     Social History Social History   Tobacco Use  . Smoking status: Never Smoker  . Smokeless tobacco: Never Used  Substance Use Topics  . Alcohol use: No  . Drug use: No    Review of Systems  Constitutional:   No fever or chills.  ENT:   No sore throat. No rhinorrhea. Cardiovascular:   No chest pain or syncope. Respiratory:   Positive shortness of breath and nonproductive cough. Gastrointestinal:   Negative for abdominal pain, vomiting and diarrhea.  Musculoskeletal:   Negative for focal pain or swelling All other systems reviewed and are negative except as documented above in ROS and HPI.  ____________________________________________   PHYSICAL EXAM:  VITAL SIGNS: ED Triage Vitals  Enc Vitals Group     BP 03/19/17 2117 (!) 158/103     Pulse Rate 03/19/17 2117 (!) 138     Resp 03/19/17 2117 (!) 34     Temp 03/19/17 2117 98.6 F (37 C)     Temp src --      SpO2 03/19/17 2117 (!) 88 %     Weight 03/19/17 2118 200 lb (90.7 kg)     Height 03/19/17 2118 5\' 4"  (1.626 m)     Head Circumference --      Peak Flow --      Pain Score --      Pain Loc --      Pain Edu? --      Excl. in [place]? --     Vital signs reviewed, nursing assessments reviewed.   Constitutional:   Alert and oriented. Severe respiratory distress. Eyes:   No scleral icterus.  EOMI. No nystagmus. No conjunctival pallor. PERRL. ENT   Head:   Normocephalic and atraumatic.   Nose:   No congestion/rhinnorhea.    Mouth/Throat:   MMM, no pharyngeal erythema. No peritonsillar mass.    Neck:   No meningismus. Full ROM. Hematological/Lymphatic/Immunilogical:   No cervical lymphadenopathy. Cardiovascular:   RRR. Symmetric bilateral radial and DP pulses.  No murmurs.  Respiratory:   Tachypnea, increased work of breathing, tripoding. Diffuse expiratory wheezing with prolonged expiratory  phase.. Gastrointestinal:   Soft and nontender. Non distended. There is no CVA tenderness.  No rebound, rigidity, or guarding. Genitourinary:   deferred Musculoskeletal:   Normal range of motion in all extremities. No joint effusions.  No lower extremity tenderness.  No edema. Neurologic:   Normal speech and language.  Motor grossly intact. No gross focal neurologic deficits are appreciated.  Skin:    Skin is warm, dry and intact. No rash noted.  No petechiae, purpura, or bullae.  ____________________________________________    LABS (pertinent positives/negatives) (all labs ordered are listed, but only abnormal results are displayed) Labs Reviewed  BASIC METABOLIC PANEL - Abnormal; Notable for the following components:  Result Value   Glucose, Bld 122 (*)    All other components within normal limits  CBC - Abnormal; Notable for the following components:   WBC 12.5 (*)    All other components within normal limits  BLOOD GAS, VENOUS - Abnormal; Notable for the following components:   pCO2, Ven 42 (*)    pO2, Ven 130.0 (*)    Acid-Base Excess 3.1 (*)    All other components within normal limits  TROPONIN I   ____________________________________________   EKG    ____________________________________________    RADIOLOGY  Dg Chest Port 1 View  Result Date: 03/19/2017 CLINICAL DATA:  57 year old female with shortness of breath. EXAM: PORTABLE CHEST 1 VIEW COMPARISON:  Chest CT dated 08/19/2016 FINDINGS: The heart size and mediastinal contours are within normal limits. Both lungs are clear. The visualized skeletal structures are unremarkable. IMPRESSION: No active disease. Electronically Signed   By: Anner Crete M.D.   On: 03/19/2017 21:43    ____________________________________________   PROCEDURES Procedures CRITICAL CARE Performed by: Joni Fears, Mekhai Venuto   Total critical care time: 35 minutes  Critical care time was exclusive of separately billable  procedures and treating other patients.  Critical care was necessary to treat or prevent imminent or life-threatening deterioration.  Critical care was time spent personally by me on the following activities: development of treatment plan with patient and/or surrogate as well as nursing, discussions with consultants, evaluation of patient's response to treatment, examination of patient, obtaining history from patient or surrogate, ordering and performing treatments and interventions, ordering and review of laboratory studies, ordering and review of radiographic studies, pulse oximetry and re-evaluation of patient's condition.  ____________________________________________    CLINICAL IMPRESSION / ASSESSMENT AND PLAN / ED COURSE  Pertinent labs & imaging results that were available during my care of the patient were reviewed by me and considered in my medical decision making (see chart for details).   Patient presents with likely COPD exacerbation, severe symptoms with hypoxia. Not feeling better after a DuoNeb. Continue with albuterol nebs, Solu-Medrol, magnesium sulfate, BiPAP due to history of severe exacerbations requiring intubation. Monitor closely. Patient has been on doxycycline and prednisone for COPD exacerbation for the past 3 days, will now need admission for further management. Low suspicion of ACS PE dissection. No evidence of tension pneumo. Clinical Course as of Mar 19 2222  Wed Mar 19, 2017  2218 Patient calm. Blood gas reassuring. Work of breathing diminished, oxygenation excellent. Plan to admit.  [PS]  2223 D/w hospitalist. Pt appears improved. Will try removing bipap  [PS]    Clinical Course User Index [PS] Carrie Mew, MD     ____________________________________________   FINAL CLINICAL IMPRESSION(S) / ED DIAGNOSES    Final diagnoses:  COPD exacerbation ([place])  Acute respiratory failure with hypoxia [facility])       Portions of this note were generated  with dragon dictation software. Dictation errors may occur despite best attempts at proofreading.    Carrie Mew, MD 03/19/17 2224

## 2017-03-20 ENCOUNTER — Encounter: Payer: Self-pay | Admitting: *Deleted

## 2017-03-20 ENCOUNTER — Other Ambulatory Visit: Payer: Self-pay

## 2017-03-20 DIAGNOSIS — E119 Type 2 diabetes mellitus without complications: Secondary | ICD-10-CM | POA: Diagnosis not present

## 2017-03-20 DIAGNOSIS — J45901 Unspecified asthma with (acute) exacerbation: Secondary | ICD-10-CM | POA: Diagnosis not present

## 2017-03-20 DIAGNOSIS — J441 Chronic obstructive pulmonary disease with (acute) exacerbation: Secondary | ICD-10-CM | POA: Diagnosis not present

## 2017-03-20 DIAGNOSIS — I10 Essential (primary) hypertension: Secondary | ICD-10-CM | POA: Diagnosis not present

## 2017-03-20 DIAGNOSIS — K219 Gastro-esophageal reflux disease without esophagitis: Secondary | ICD-10-CM | POA: Diagnosis not present

## 2017-03-20 LAB — CBC
HCT: 39.9 % (ref 35.0–47.0)
Hemoglobin: 13.5 g/dL (ref 12.0–16.0)
MCH: 30.9 pg (ref 26.0–34.0)
MCHC: 33.7 g/dL (ref 32.0–36.0)
MCV: 91.6 fL (ref 80.0–100.0)
PLATELETS: 237 10*3/uL (ref 150–440)
RBC: 4.36 MIL/uL (ref 3.80–5.20)
RDW: 13.8 % (ref 11.5–14.5)
WBC: 8.7 10*3/uL (ref 3.6–11.0)

## 2017-03-20 LAB — GLUCOSE, CAPILLARY
Glucose-Capillary: 175 mg/dL — ABNORMAL HIGH (ref 65–99)
Glucose-Capillary: 169 mg/dL — ABNORMAL HIGH (ref 65–99)
Glucose-Capillary: 220 mg/dL — ABNORMAL HIGH (ref 65–99)

## 2017-03-20 LAB — BASIC METABOLIC PANEL
Anion gap: 14 (ref 5–15)
BUN: 18 mg/dL (ref 6–20)
CHLORIDE: 104 mmol/L (ref 101–111)
CO2: 22 mmol/L (ref 22–32)
CREATININE: 0.59 mg/dL (ref 0.44–1.00)
Calcium: 8.5 mg/dL — ABNORMAL LOW (ref 8.9–10.3)
GFR calc non Af Amer: >60 mL/min (ref >60)
Glucose, Bld: 186 mg/dL — ABNORMAL HIGH (ref 65–99)
Potassium: 3.8 mmol/L (ref 3.5–5.1)
Sodium: 140 mmol/L (ref 135–145)

## 2017-03-20 MED ORDER — IPRATROPIUM-ALBUTEROL 0.5-2.5 (3) MG/3ML IN SOLN
3.0000 mL | Freq: Four times a day (QID) | RESPIRATORY_TRACT | Status: DC
  Administered 2017-03-20 (×3): 3 mL via RESPIRATORY_TRACT
  Filled 2017-03-20 (×3): qty 3

## 2017-03-20 MED ORDER — INSULIN ASPART 100 UNIT/ML ~~LOC~~ SOLN
0.0000 [IU] | Freq: Every day | SUBCUTANEOUS | Status: DC
Start: 2017-03-20 — End: 2017-03-20

## 2017-03-20 MED ORDER — ENOXAPARIN SODIUM 40 MG/0.4ML ~~LOC~~ SOLN: 40.0000 mg | SUBCUTANEOUS | Status: DC

## 2017-03-20 MED ORDER — METHYLPREDNISOLONE SODIUM SUCC 125 MG IJ SOLR: 60.0000 mg | Freq: Every day | INTRAMUSCULAR | Status: DC

## 2017-03-20 MED ORDER — ALPRAZOLAM 0.5 MG PO TABS
0.5000 mg | ORAL_TABLET | Freq: Two times a day (BID) | ORAL | Status: DC
Start: 2017-03-20 — End: 2017-03-20
  Administered 2017-03-20: 1 mg via ORAL
  Filled 2017-03-20: qty 2

## 2017-03-20 MED ORDER — THEOPHYLLINE ER 300 MG PO TB12
300.0000 mg | ORAL_TABLET | Freq: Every day | ORAL | Status: DC
  Administered 2017-03-20: 300 mg via ORAL
  Filled 2017-03-20: qty 1

## 2017-03-20 MED ORDER — LEVOFLOXACIN IN D5W 750 MG/150ML IV SOLN
750.0000 mg | INTRAVENOUS | Status: DC
  Filled 2017-03-20: qty 150

## 2017-03-20 MED ORDER — INSULIN ASPART 100 UNIT/ML ~~LOC~~ SOLN
0.0000 [IU] | Freq: Three times a day (TID) | SUBCUTANEOUS | Status: DC
  Administered 2017-03-20: 3 [IU] via SUBCUTANEOUS
  Administered 2017-03-20: 2 [IU] via SUBCUTANEOUS
  Filled 2017-03-20 (×2): qty 1

## 2017-03-20 MED ORDER — ONDANSETRON HCL 4 MG/2ML IJ SOLN: 4.0000 mg | Freq: Four times a day (QID) | INTRAMUSCULAR | Status: DC | PRN

## 2017-03-20 MED ORDER — IPRATROPIUM-ALBUTEROL 0.5-2.5 (3) MG/3ML IN SOLN: 3.0000 mL | Freq: Four times a day (QID) | RESPIRATORY_TRACT | Status: DC | PRN

## 2017-03-20 MED ORDER — DILTIAZEM HCL ER COATED BEADS 180 MG PO CP24
180.0000 mg | ORAL_CAPSULE | Freq: Two times a day (BID) | ORAL | Status: DC
  Administered 2017-03-20: 180 mg via ORAL
  Filled 2017-03-20: qty 1

## 2017-03-20 MED ORDER — ACETAMINOPHEN 325 MG PO TABS: 650.0000 mg | ORAL_TABLET | Freq: Four times a day (QID) | ORAL | Status: DC | PRN

## 2017-03-20 MED ORDER — PREDNISONE 50 MG PO TABS: 50.0000 mg | ORAL_TABLET | Freq: Every day | ORAL | Status: DC

## 2017-03-20 MED ORDER — ACETAMINOPHEN 650 MG RE SUPP: 650.0000 mg | Freq: Four times a day (QID) | RECTAL | Status: DC | PRN

## 2017-03-20 MED ORDER — MOMETASONE FURO-FORMOTEROL FUM 200-5 MCG/ACT IN AERO
2.0000 | INHALATION_SPRAY | Freq: Two times a day (BID) | RESPIRATORY_TRACT | Status: DC
  Administered 2017-03-20: 2 via RESPIRATORY_TRACT
  Filled 2017-03-20: qty 8.8

## 2017-03-20 MED ORDER — METHYLPREDNISOLONE SODIUM SUCC 125 MG IJ SOLR
60.0000 mg | Freq: Four times a day (QID) | INTRAMUSCULAR | Status: DC
  Administered 2017-03-20 (×2): 60 mg via INTRAVENOUS
  Filled 2017-03-20 (×2): qty 2

## 2017-03-20 MED ORDER — MONTELUKAST SODIUM 10 MG PO TABS: 10.0000 mg | ORAL_TABLET | Freq: Every day | ORAL | Status: DC

## 2017-03-20 MED ORDER — ASPIRIN EC 81 MG PO TBEC
81.0000 mg | DELAYED_RELEASE_TABLET | Freq: Every day | ORAL | Status: DC
  Administered 2017-03-20: 81 mg via ORAL
  Filled 2017-03-20: qty 1

## 2017-03-20 MED ORDER — ATORVASTATIN CALCIUM 20 MG PO TABS
10.0000 mg | ORAL_TABLET | Freq: Every day | ORAL | Status: DC
  Administered 2017-03-20: 10 mg via ORAL
  Filled 2017-03-20: qty 1

## 2017-03-20 MED ORDER — GUAIFENESIN-DM 100-10 MG/5ML PO SYRP
5.0000 mL | ORAL_SOLUTION | ORAL | Status: DC | PRN
  Administered 2017-03-20: 5 mL via ORAL
  Filled 2017-03-20: qty 5

## 2017-03-20 MED ORDER — FUROSEMIDE 40 MG PO TABS
40.0000 mg | ORAL_TABLET | Freq: Every day | ORAL | Status: DC
  Administered 2017-03-20: 40 mg via ORAL
  Filled 2017-03-20: qty 1

## 2017-03-20 MED ORDER — ONDANSETRON HCL 4 MG PO TABS: 4.0000 mg | ORAL_TABLET | Freq: Four times a day (QID) | ORAL | Status: DC | PRN

## 2017-03-20 NOTE — Progress Notes (Signed)
Discharged instructions explained to pt/ verbalized an understanding/ iv and tele removed/ will transport off unit via wheelchiar

## 2017-03-20 NOTE — ED Notes (Signed)
Pt. Verbalizes understanding of admission. VS stable and pain controlled per pt.  Pt. In NAD and stable at the time of departure from the unit, departing unit by the safest and most appropriate manner per that pt condition and limitations (wheelchair) with all belongings accounted for.

## 2017-03-20 NOTE — Progress Notes (Signed)
Pharmacy Antibiotic Note  Cassie Ross is a 57 y.o. female admitted on 03/19/2017 with asthma exacerbation.  Pharmacy has been consulted for levofloxacin dosing.  Plan: Levofloxacin 750 mg q 24 hours ordered  Height: 5\' 3"  (160 cm) Weight: 192 lb 8 oz (87.3 kg) IBW/kg (Calculated) : 52.4  Temp (24hrs), Avg:98.2 F (36.8 C), Min:97.8 F (36.6 C), Max:98.6 F (37 C)  Recent Labs  Lab 03/19/17 2118  WBC 12.5*  CREATININE 0.74    Estimated Creatinine Clearance: 80.3 mL/min (by C-G formula based on SCr of 0.74 mg/dL).    Allergies  Allergen Reactions  . Penicillins     Has patient had a PCN reaction causing immediate rash, facial/tongue/throat swelling, SOB or lightheadedness with hypotension: no Has patient had a PCN reaction causing severe rash involving mucus membranes or skin necrosis: no Has patient had a PCN reaction that required hospitalization no Has patient had a PCN reaction occurring within the last 10 years: no If all of the above answers are "NO", then may proceed with Cephalosporin use.   . Shellfish Allergy Swelling  . Sunflower Oil Swelling    seeds  . Levaquin [Levofloxacin] Rash    Allergy to pill form only. Patient has had IV with no problem.    Antimicrobials this admission: Levofloxacin 1/9  >>    >>   Dose adjustments this admission:   Microbiology results: No micro    1/9 CXR: no active disease Thank you for allowing pharmacy to be a part of this patient's care.  Renezmae Canlas S 03/20/2017 1:16 AM

## 2017-03-20 NOTE — ED Notes (Signed)
Pt ambulatory to toilet in room without complication. Resting comfortably in bed at this time.

## 2017-03-25 NOTE — Discharge Summary (Signed)
[facility] NAME: Cassie Ross    MR#:  [id number]  DATE OF BIRTH:  1960/04/18  DATE OF ADMISSION:  03/19/2017 ADMITTING PHYSICIAN: Lance Coon, MD  DATE OF DISCHARGE: 03/20/2017  PRIMARY CARE PHYSICIAN: Lavera Guise, MD    ADMISSION DIAGNOSIS:  Wheezing [R06.2] SOB (shortness of breath) [R06.02] COPD exacerbation (HCC) [J44.1] Acute respiratory failure with hypoxia (HCC) [J96.01] Asthma exacerbation [J45.901]  DISCHARGE DIAGNOSIS:  Asthma exacerbation  SECONDARY DIAGNOSIS:   Past Medical History:  Diagnosis Date  . Asthma   . CHF (congestive heart failure) ([place])    1991- unknown after asthma attack  . COPD (chronic obstructive pulmonary disease) ([place])   . Diabetes ([place])   . GERD (gastroesophageal reflux disease)   . Hypertension   . Hypertension     HOSPITAL COURSE:  Cassie Ross  is a 57 y.o. female who presents with shortness of breath, wheezing, cough.  Patient states that 2 weeks ago she was having similar symptoms and took a course of prednisone and azithromycin.  She felt better for about a day, and then her symptoms progressed again.  *Asthma exacerbation -IV Solu-Medrol, scheduled and as needed nebs, PRN antitussive, Levaquin -  She came to the ED  for evaluation and required short course of BiPAP here in the ED -weaned to RA -recommended to cont inhalers,oxygen and nebs prn  *  HTN (hypertension) -continue home meds  *  Diabetes ([place]) -sliding scale insulin and cont home insulin and invokana   *GERD (gastroesophageal reflux disease) -not on scheduled medication for this, treat as needed  Overall improving CONSULTS OBTAINED:    DRUG ALLERGIES:   Allergies  Allergen Reactions  . Penicillins     Has patient had a PCN reaction causing immediate rash, facial/tongue/throat swelling, SOB or lightheadedness with hypotension: no Has patient had a PCN reaction causing severe rash involving  mucus membranes or skin necrosis: no Has patient had a PCN reaction that required hospitalization no Has patient had a PCN reaction occurring within the last 10 years: no If all of the above answers are "NO", then may proceed with Cephalosporin use.   . Shellfish Allergy Swelling  . Sunflower Oil Swelling    seeds  . Levaquin [Levofloxacin] Rash    Allergy to pill form only. Patient has had IV with no problem.    DISCHARGE MEDICATIONS:   Allergies as of 03/20/2017      Reactions   Penicillins    Has patient had a PCN reaction causing immediate rash, facial/tongue/throat swelling, SOB or lightheadedness with hypotension: no Has patient had a PCN reaction causing severe rash involving mucus membranes or skin necrosis: no Has patient had a PCN reaction that required hospitalization no Has patient had a PCN reaction occurring within the last 10 years: no If all of the above answers are "NO", then may proceed with Cephalosporin use.   Shellfish Allergy Swelling   Sunflower Oil Swelling   seeds   Levaquin [levofloxacin] Rash   Allergy to pill form only. Patient has had IV with no problem.      Medication List    TAKE these medications   albuterol 108 (90 Base) MCG/ACT inhaler Commonly known as:  PROVENTIL HFA;VENTOLIN HFA Inhale 1 puff into the lungs as needed for wheezing or shortness of breath.   albuterol (2.5 MG/3ML) 0.083% nebulizer solution Commonly known as:  PROVENTIL Take 2.5 mg by nebulization every 4 (four) hours as needed for  wheezing or shortness of breath.   ALPRAZolam 0.5 MG tablet Commonly known as:  XANAX Take 0.5-1 mg by mouth 2 (two) times daily.   aspirin EC 81 MG tablet Take 81 mg by mouth daily.   atorvastatin 10 MG tablet Commonly known as:  LIPITOR Take 10 mg by mouth daily.   budesonide-formoterol 160-4.5 MCG/ACT inhaler Commonly known as:  SYMBICORT Inhale 1 puff into the lungs 2 (two) times daily.   CALCIUM + D PO Take 2 tablets by mouth 2  (two) times daily.   CORICIDIN 2-325 MG Tabs Generic drug:  Chlorpheniramine-APAP Take 2 tablets by mouth daily as needed.   diltiazem 180 MG 24 hr capsule Commonly known as:  CARDIZEM CD Take 180 mg by mouth 2 (two) times daily.   doxycycline 100 MG tablet Commonly known as:  VIBRA-TABS Take 100 mg by mouth 2 (two) times daily. For 14 days. (On day 3)   furosemide 40 MG tablet Commonly known as:  LASIX Take 40 mg by mouth daily.   guaiFENesin-dextromethorphan 100-10 MG/5ML syrup Commonly known as:  ROBITUSSIN DM Take 5 mLs by mouth every 4 (four) hours as needed for cough.   insulin lispro 100 UNIT/ML injection Commonly known as:  HUMALOG Inject 2-10 Units into the skin 3 (three) times daily before meals. Based on sliding scale. For BS of 150-200, take 2 units, 201-250, take 4 units, 251-300, take 6 units, 301-350 take 8 units, 351-400 take 10 units. Over 400 contact physician.   INVOKAMET 150-500 MG Tabs Generic drug:  Canagliflozin-Metformin HCl Take 1 tablet by mouth at bedtime.   loratadine 10 MG tablet Commonly known as:  CLARITIN Take 10 mg by mouth daily.   montelukast 10 MG tablet Commonly known as:  SINGULAIR Take 10 mg by mouth at bedtime.   ONE-A-DAY MENOPAUSE FORMULA PO Take 1 tablet by mouth daily.   potassium chloride 10 MEQ tablet Commonly known as:  K-DUR,KLOR-CON Take 10 mEq by mouth 2 (two) times daily.   predniSONE 20 MG tablet Commonly known as:  DELTASONE Take 20 mg by mouth daily with breakfast. Pt tapers on her own depending on breathing. Currently 60 mg for 4 days and taper slowly down by 10 mg every 4 days. On day 3 of 60 mg taper. What changed:  Another medication with the same name was removed. Continue taking this medication, and follow the directions you see here.   theophylline 300 MG 12 hr tablet Commonly known as:  THEODUR Take 300 mg by mouth daily.       If you experience worsening of your admission symptoms, develop  shortness of breath, life threatening emergency, suicidal or homicidal thoughts you must seek medical attention immediately by calling 911 or calling your MD immediately  if symptoms less severe.  You Must read complete instructions/literature along with all the possible adverse reactions/side effects for all the Medicines you take and that have been prescribed to you. Take any new Medicines after you have completely understood and accept all the possible adverse reactions/side effects.   Please note  You were cared for by a hospitalist during your hospital stay. If you have any questions about your discharge medications or the care you received while you were in the hospital after you are discharged, you can call the unit and asked to speak with the hospitalist on call if the hospitalist that took care of you is not available. Once you are discharged, your primary care physician will handle any further medical  issues. Please note that NO REFILLS for any discharge medications will be authorized once you are discharged, as it is imperative that you return to your primary care physician (or establish a relationship with a primary care physician if you do not have one) for your aftercare needs so that they can reassess your need for medications and monitor your lab values. Today   SUBJECTIVE  Breathing improving   VITAL SIGNS:  Blood pressure (!) 126/59, pulse (!) 113, temperature 98.1 F (36.7 C), temperature source Oral, resp. rate 18, height 5\' 3"  (1.6 m), weight 87.3 kg (192 lb 8 oz), SpO2 92 %.  I/O:  No intake or output data in the 24 hours ending 03/25/17 1042  PHYSICAL EXAMINATION:  GENERAL:  57 y.o.-year-old patient lying in the bed with no acute distress.  EYES: Pupils equal, round, reactive to light and accommodation. No scleral icterus. Extraocular muscles intact.  HEENT: Head atraumatic, normocephalic. Oropharynx and nasopharynx clear.  NECK:  Supple, no jugular venous distention.  No thyroid enlargement, no tenderness.  LUNGS: Normal breath sounds bilaterally, no wheezing, rales,rhonchi or crepitation. No use of accessory muscles of respiration.  CARDIOVASCULAR: S1, S2 normal. No murmurs, rubs, or gallops.  ABDOMEN: Soft, non-tender, non-distended. Bowel sounds present. No organomegaly or mass.  EXTREMITIES: No pedal edema, cyanosis, or clubbing.  NEUROLOGIC: Cranial nerves II through XII are intact. Muscle strength 5/5 in all extremities. Sensation intact. Gait not checked.  PSYCHIATRIC: The patient is alert and oriented x 3.  SKIN: No obvious rash, lesion, or ulcer.   DATA REVIEW:   CBC  Recent Labs  Lab 03/20/17 0445  WBC 8.7  HGB 13.5  HCT 39.9  PLT 237    Chemistries  Recent Labs  Lab 03/20/17 0445  NA 140  K 3.8  CL 104  CO2 22  GLUCOSE 186*  BUN 18  CREATININE 0.59  CALCIUM 8.5*    Microbiology Results   No results found for this or any previous visit (from the past 240 hour(s)).  RADIOLOGY:  No results found.   Management plans discussed with the patient, family and they are in agreement.  CODE STATUS:  Code Status History    Date Active Date Inactive Code Status Order ID Comments User Context   03/20/2017 00:44 03/20/2017 17:22 Full Code [id number]  Lance Coon, MD ED   04/22/2016 01:19 04/25/2016 15:40 Full Code [id number]  Lance Coon, MD ED   02/26/2016 18:41 02/28/2016 17:00 Full Code [id number]  Gladstone Lighter, MD ED   02/21/2016 10:52 02/22/2016 14:11 Full Code [id number]  Max Sane, MD Inpatient      TOTAL TIME TAKING CARE OF THIS PATIENT: *40* minutes.    Fritzi Mandes M.D on 03/25/2017 at 10:42 AM  Between 7am to 6pm - Pager - [phone] After 6pm go to [email or web address] - password [email or web address] [facility]  Office  [phone]  CC: Primary care physician; Lavera Guise, MD

## 2017-04-21 ENCOUNTER — Other Ambulatory Visit: Payer: Self-pay | Admitting: Internal Medicine

## 2017-04-22 ENCOUNTER — Other Ambulatory Visit: Payer: Self-pay

## 2017-04-22 MED ORDER — DILTIAZEM HCL ER COATED BEADS 180 MG PO CP24: 180.0000 mg | ORAL_CAPSULE | Freq: Two times a day (BID) | ORAL | 1 refills | Status: DC

## 2017-05-01 ENCOUNTER — Ambulatory Visit: Payer: Self-pay | Admitting: Internal Medicine

## 2017-05-05 ENCOUNTER — Ambulatory Visit: Payer: Self-pay | Admitting: Internal Medicine

## 2017-05-13 ENCOUNTER — Other Ambulatory Visit: Payer: Self-pay

## 2017-05-13 MED ORDER — ATORVASTATIN CALCIUM 10 MG PO TABS: 10.0000 mg | ORAL_TABLET | Freq: Every day | ORAL | 1 refills | Status: DC

## 2017-05-13 MED ORDER — MONTELUKAST SODIUM 10 MG PO TABS: 10.0000 mg | ORAL_TABLET | Freq: Every day | ORAL | 1 refills | Status: DC

## 2017-05-19 ENCOUNTER — Encounter: Payer: Self-pay | Admitting: Nurse Practitioner

## 2017-05-19 ENCOUNTER — Ambulatory Visit (INDEPENDENT_AMBULATORY_CARE_PROVIDER_SITE_OTHER): Payer: PPO | Admitting: Nurse Practitioner

## 2017-05-19 VITALS — BP 125/87 | HR 76 | Resp 16 | Ht 63.0 in | Wt 196.6 lb

## 2017-05-19 DIAGNOSIS — I10 Essential (primary) hypertension: Secondary | ICD-10-CM | POA: Diagnosis not present

## 2017-05-19 DIAGNOSIS — J45909 Unspecified asthma, uncomplicated: Secondary | ICD-10-CM | POA: Diagnosis not present

## 2017-05-19 DIAGNOSIS — E1165 Type 2 diabetes mellitus with hyperglycemia: Secondary | ICD-10-CM | POA: Diagnosis not present

## 2017-05-19 DIAGNOSIS — E876 Hypokalemia: Secondary | ICD-10-CM | POA: Diagnosis not present

## 2017-05-19 DIAGNOSIS — L209 Atopic dermatitis, unspecified: Secondary | ICD-10-CM

## 2017-05-19 LAB — POCT GLYCOSYLATED HEMOGLOBIN (HGB A1C): Hemoglobin A1C: 6.3

## 2017-05-19 MED ORDER — TRIAMCINOLONE ACETONIDE 0.025 % EX CREA: 1.0000 "application " | TOPICAL_CREAM | Freq: Two times a day (BID) | CUTANEOUS | 2 refills | Status: DC

## 2017-05-19 MED ORDER — POTASSIUM CHLORIDE CRYS ER 10 MEQ PO TBCR: 10.0000 meq | EXTENDED_RELEASE_TABLET | Freq: Two times a day (BID) | ORAL | 5 refills | Status: DC

## 2017-05-19 MED ORDER — ALBUTEROL SULFATE HFA 108 (90 BASE) MCG/ACT IN AERS: 2.0000 | INHALATION_SPRAY | RESPIRATORY_TRACT | 3 refills | Status: DC | PRN

## 2017-05-19 NOTE — Progress Notes (Signed)
[facility] [place], [place] [address]  Internal MEDICINE  Office Visit Note  Patient Name: Cassie Ross  [id number]  [id number]  Date of Service: 06/08/2017  No chief complaint on file.   The patient is here for routine follow up visit. She states that her blood sugars have been doing well. Generally do well when she is not needing treatment with prednisone for flare up of severe asthma. Over the past several weeks, she states that her asthma has been doing well. Acute symptoms have been mild and resolve with rescue inhaler or nebulizer treatments  She has noted noted some fungal type rash under the breasts. This is itchy and very red.    Pt is here for routine follow up.    Current Medication: Outpatient Encounter Medications as of 05/19/2017  Medication Sig Note  . ALPRAZolam (XANAX) 0.5 MG tablet 0.5-1 mg.    . aspirin EC 81 MG tablet Take 81 mg by mouth daily.   Marland Kitchen atorvastatin (LIPITOR) 10 MG tablet Take 1 tablet (10 mg total) by mouth daily.   . budesonide-formoterol (SYMBICORT) 160-4.5 MCG/ACT inhaler Inhale 1 puff into the lungs 2 (two) times daily.   . Calcium Citrate-Vitamin D (CALCIUM + D PO) Take 2 tablets by mouth 2 (two) times daily.   . Canagliflozin-Metformin HCl (INVOKAMET) 150-500 MG TABS Take 1 tablet by mouth at bedtime.   Marland Kitchen diltiazem (CARDIZEM CD) 180 MG 24 hr capsule Take 1 capsule (180 mg total) by mouth 2 (two) times daily.   . furosemide (LASIX) 40 MG tablet Take 40 mg by mouth daily.  03/19/2017: Pt only takes a second dose if needed.   Marland Kitchen glucose blood (TRUE METRIX BLOOD GLUCOSE TEST) test strip    . insulin lispro (HUMALOG) 100 UNIT/ML injection Inject 2-10 Units into the skin 3 (three) times daily before meals. Based on sliding scale. For BS of 150-200, take 2 units, 201-250, take 4 units, 251-300, take 6 units, 301-350 take 8 units, 351-400 take 10 units. Over 400 contact physician.   . loratadine (CLARITIN) 10 MG tablet Take 10  mg by mouth daily.   . montelukast (SINGULAIR) 10 MG tablet Take 1 tablet (10 mg total) by mouth at bedtime.   . Multiple Vitamins-Minerals (ONE-A-DAY MENOPAUSE FORMULA PO) Take 1 tablet by mouth daily.   . potassium chloride (K-DUR,KLOR-CON) 10 MEQ tablet Take 1 tablet (10 mEq total) by mouth 2 (two) times daily.   . predniSONE (DELTASONE) 20 MG tablet Take 20 mg by mouth daily with breakfast. Pt tapers on her own depending on breathing. Currently 60 mg for 4 days and taper slowly down by 10 mg every 4 days. On day 3 of 60 mg taper.   . TRUEPLUS LANCETS 28G MISC    . [DISCONTINUED] albuterol (PROVENTIL HFA;VENTOLIN HFA) 108 (90 Base) MCG/ACT inhaler Inhale 1 puff into the lungs as needed for wheezing or shortness of breath.   . [DISCONTINUED] albuterol (PROVENTIL) (2.5 MG/3ML) 0.083% nebulizer solution Take 2.5 mg by nebulization every 4 (four) hours as needed for wheezing or shortness of breath. 05/19/2017: d/c ventolin due to non-coverage  . [DISCONTINUED] potassium chloride (K-DUR,KLOR-CON) 10 MEQ tablet Take 10 mEq by mouth 2 (two) times daily.   . [DISCONTINUED] theophylline (THEODUR) 300 MG 12 hr tablet TAKE 1 TABLET BY MOUTH ONCE DAILY   . albuterol (PROAIR HFA) 108 (90 Base) MCG/ACT inhaler Inhale 2 puffs into the lungs every 4 (four) hours as needed for wheezing or shortness  of breath.   . Chlorpheniramine-APAP (CORICIDIN) 2-325 MG TABS Take 2 tablets by mouth daily as needed.   . triamcinolone (KENALOG) 0.025 % cream Apply 1 application topically 2 (two) times daily.   . [DISCONTINUED] doxycycline (VIBRA-TABS) 100 MG tablet Take 100 mg by mouth 2 (two) times daily. For 14 days. (On day 3)   . [DISCONTINUED] guaiFENesin-dextromethorphan (ROBITUSSIN DM) 100-10 MG/5ML syrup Take 5 mLs by mouth every 4 (four) hours as needed for cough. (Patient not taking: Reported on 03/19/2017)    No facility-administered encounter medications on file as of 05/19/2017.     Surgical History: Past Surgical  History:  Procedure Laterality Date  . cataract surgery    . CESAREAN SECTION    . CHOLECYSTECTOMY    . ESOPHAGEAL DILATION    . HERNIA REPAIR    . hiatial hernia repair    . NASAL SINUS SURGERY      Medical History: Past Medical History:  Diagnosis Date  . Asthma   . CHF (congestive heart failure) ([place])    1991- unknown after asthma attack  . COPD (chronic obstructive pulmonary disease) ([place])   . Diabetes ([place])   . GERD (gastroesophageal reflux disease)   . Hypertension   . Hypertension     Family History: Family History  Problem Relation Age of Onset  . Hypertension Mother     Social History   Socioeconomic History  . Marital status: Married    Spouse name: Not on file  . Number of children: Not on file  . Years of education: Not on file  . Highest education level: Not on file  Occupational History  . Not on file  Social Needs  . Financial resource strain: Not on file  . Food insecurity:    Worry: Not on file    Inability: Not on file  . Transportation needs:    Medical: Not on file    Non-medical: Not on file  Tobacco Use  . Smoking status: Never Smoker  . Smokeless tobacco: Never Used  Substance and Sexual Activity  . Alcohol use: No  . Drug use: No  . Sexual activity: Not on file  Lifestyle  . Physical activity:    Days per week: Not on file    Minutes per session: Not on file  . Stress: Not on file  Relationships  . Social connections:    Talks on phone: Not on file    Gets together: Not on file    Attends religious service: Not on file    Active member of club or organization: Not on file    Attends meetings of clubs or organizations: Not on file    Relationship status: Not on file  . Intimate partner violence:    Fear of current or ex partner: Not on file    Emotionally abused: Not on file    Physically abused: Not on file    Forced sexual activity: Not on file  Other Topics Concern  . Not on file  Social History Narrative   Lives at  home with family. Independent      Review of Systems  Constitutional: Negative for activity change, chills, fatigue and unexpected weight change.  HENT: Negative for congestion, rhinorrhea, sneezing, sore throat and voice change.   Eyes: Negative.  Negative for redness.  Respiratory: Positive for cough, shortness of breath and wheezing. Negative for chest tightness.        Symptoms are intermittent and she is controlling them with current  medications.   Cardiovascular: Negative for chest pain and palpitations.  Gastrointestinal: Negative for abdominal pain, constipation, diarrhea, nausea and vomiting.  Endocrine:       Blood sugars doing well   Genitourinary: Negative.  Negative for dysuria and frequency.  Musculoskeletal: Negative for arthralgias, back pain, joint swelling and neck pain.  Skin: Positive for rash.  Allergic/Immunologic: Positive for environmental allergies.  Neurological: Negative for tremors and numbness.  Hematological: Negative for adenopathy. Does not bruise/bleed easily.  Psychiatric/Behavioral: Negative for behavioral problems (Depression), sleep disturbance and suicidal ideas. The patient is nervous/anxious.    Today's Vitals   05/19/17 1219  BP: 125/87  Pulse: 76  Resp: 16  SpO2: 96%  Weight: 196 lb 9.6 oz (89.2 kg)  Height: 5\' 3"  (1.6 m)    Physical Exam  Constitutional: She is oriented to person, place, and time. She appears well-developed and well-nourished. No distress.  HENT:  Head: Normocephalic and atraumatic.  Mouth/Throat: Oropharynx is clear and moist. No oropharyngeal exudate.  Eyes: Pupils are equal, round, and reactive to light. EOM are normal.  Neck: Normal range of motion. Neck supple. No JVD present. No tracheal deviation present. No thyromegaly present.  Cardiovascular: Normal rate, regular rhythm and normal heart sounds. Exam reveals no gallop and no friction rub.  No murmur heard. Pulmonary/Chest: Effort normal and breath sounds  normal. No respiratory distress. She has no wheezes. She has no rales. She exhibits no tenderness.  Very scant, expiratory wheezes heard in lower lung fields. They are otherwise clear and equal thorughout the lung fields.   Abdominal: Soft. Bowel sounds are normal. There is no tenderness.  Musculoskeletal: Normal range of motion.  Lymphadenopathy:    She has no cervical adenopathy.  Neurological: She is alert and oriented to person, place, and time. No cranial nerve deficit.  Skin: Skin is warm and dry. Rash noted. She is not diaphoretic.  Psychiatric: She has a normal mood and affect. Her behavior is normal. Judgment and thought content normal.  Nursing note and vitals reviewed.  Assessment/Plan: 1. Uncontrolled type 2 diabetes mellitus with hyperglycemia (HCC) - POCT HgB A1C 6.3 today. Continue to control blood sugars through current medications. Closely adhere to ADA diet.   2. Atopic dermatitis, unspecified type - triamcinolone (KENALOG) 0.025 % cream; Apply 1 application topically 2 (two) times daily.  Dispense: 80 g; Refill: 2  3. Severe asthma without complication, unspecified whether persistent Continue to use inhalers and respiratory medications as prescribed.  - albuterol (PROAIR HFA) 108 (90 Base) MCG/ACT inhaler; Inhale 2 puffs into the lungs every 4 (four) hours as needed for wheezing or shortness of breath.  Dispense: 3 Inhaler; Refill: 3  4. Hypokalemia - potassium chloride (K-DUR,KLOR-CON) 10 MEQ tablet; Take 1 tablet (10 mEq total) by mouth 2 (two) times daily.  Dispense: 180 tablet; Refill: 5  5. Essential hypertension Stable. Continue bp medication as prescribed   General Counseling: jalon blackwelder understanding of the findings of todays visit and agrees with plan of treatment. I have discussed any further diagnostic evaluation that may be needed or ordered today. We also reviewed her medications today. she has been encouraged to call the office with any questions  or concerns that should arise related to todays visit.  Diabetes Counseling:  1. Addition of ACE inh/ ARB'S for nephroprotection. 2. Diabetic foot care, prevention of complications.  3.Exercise and lose weight.  4. Diabetic eye examination, 5. Monitor blood sugar closlely. nutrition counseling.  6.Sign and symptoms of hypoglycemia including shaking  sweating,confusion and headaches.  This patient was seen by Leretha Pol, FNP- C in Collaboration with Dr Lavera Guise as a part of collaborative care agreement   Orders Placed This Encounter  Procedures  . POCT HgB A1C    Meds ordered this encounter  Medications  . triamcinolone (KENALOG) 0.025 % cream    Sig: Apply 1 application topically 2 (two) times daily.    Dispense:  80 g    Refill:  2    Order Specific Question:   Supervising Provider    Answer:   Lavera Guise [[id number]]  . albuterol (PROAIR HFA) 108 (90 Base) MCG/ACT inhaler    Sig: Inhale 2 puffs into the lungs every 4 (four) hours as needed for wheezing or shortness of breath.    Dispense:  3 Inhaler    Refill:  3    Order Specific Question:   Supervising Provider    Answer:   Lavera Guise [[id number]]  . potassium chloride (K-DUR,KLOR-CON) 10 MEQ tablet    Sig: Take 1 tablet (10 mEq total) by mouth 2 (two) times daily.    Dispense:  180 tablet    Refill:  5    Order Specific Question:   Supervising Provider    Answer:   Lavera Guise [[id number]]    Time spent: 4 Minutes     Dr Lavera Guise Internal medicine

## 2017-05-22 ENCOUNTER — Ambulatory Visit (INDEPENDENT_AMBULATORY_CARE_PROVIDER_SITE_OTHER): Payer: PPO | Admitting: Internal Medicine

## 2017-05-22 ENCOUNTER — Encounter: Payer: Self-pay | Admitting: Internal Medicine

## 2017-05-22 ENCOUNTER — Ambulatory Visit: Payer: Self-pay | Admitting: Internal Medicine

## 2017-05-22 VITALS — BP 144/90 | HR 120 | Resp 16 | Ht 63.0 in | Wt 197.4 lb

## 2017-05-22 DIAGNOSIS — K219 Gastro-esophageal reflux disease without esophagitis: Secondary | ICD-10-CM | POA: Diagnosis not present

## 2017-05-22 DIAGNOSIS — J440 Chronic obstructive pulmonary disease with acute lower respiratory infection: Secondary | ICD-10-CM | POA: Diagnosis not present

## 2017-05-22 DIAGNOSIS — J209 Acute bronchitis, unspecified: Secondary | ICD-10-CM

## 2017-05-22 DIAGNOSIS — J4551 Severe persistent asthma with (acute) exacerbation: Secondary | ICD-10-CM | POA: Diagnosis not present

## 2017-05-22 MED ORDER — AZITHROMYCIN 250 MG PO TABS: ORAL_TABLET | ORAL | 0 refills | Status: DC

## 2017-05-22 NOTE — Progress Notes (Signed)
[facility] [address] [place], [place] [address]  Pulmonary Sleep Medicine  Office Visit Note  Patient Name: Cassie Ross DOB: 08/04/1960 MRN [id number]  Date of Service: 05/22/2017  Complaints/HPI:  She was seen recently for primary care was doing fairly well.  Now she has noticed some increased congestion and cough.  She states that she is having more wheezing than normal.  She denies having any fevers or chills at this time.  She has had no body aches noted.  She started taking her prednisone at home.  She does not have any antibiotics  ROS  General: (-) fever, (-) chills, (-) night sweats, (-) weakness Skin: (-) rashes, (-) itching,. Eyes: (-) visual changes, (-) redness, (-) itching. Nose and Sinuses: (-) nasal stuffiness or itchiness, (-) postnasal drip, (-) nosebleeds, (-) sinus trouble. Mouth and Throat: (-) sore throat, (-) hoarseness. Neck: (-) swollen glands, (-) enlarged thyroid, (-) neck pain. Respiratory: + cough, (-) bloody sputum, + shortness of breath, + wheezing. Cardiovascular: + ankle swelling, (-) chest pain. Lymphatic: (-) lymph node enlargement. Neurologic: (-) numbness, (-) tingling. Psychiatric: (-) anxiety, (-) depression   Current Medication: Outpatient Encounter Medications as of 05/22/2017  Medication Sig Note  . albuterol (PROAIR HFA) 108 (90 Base) MCG/ACT inhaler Inhale 2 puffs into the lungs every 4 (four) hours as needed for wheezing or shortness of breath.   . ALPRAZolam (XANAX) 0.5 MG tablet 0.5-1 mg.    . aspirin EC 81 MG tablet Take 81 mg by mouth daily.   . atorvastatin (LIPITOR) 10 MG tablet Take 1 tablet (10 mg total) by mouth daily.   . budesonide-formoterol (SYMBICORT) 160-4.5 MCG/ACT inhaler Inhale 1 puff into the lungs 2 (two) times daily.   . Calcium Citrate-Vitamin D (CALCIUM + D PO) Take 2 tablets by mouth 2 (two) times daily.   . Canagliflozin-Metformin HCl (INVOKAMET) 150-500 MG TABS Take 1 tablet by mouth at  bedtime.   . Chlorpheniramine-APAP (CORICIDIN) 2-325 MG TABS Take 2 tablets by mouth daily as needed.   . diltiazem (CARDIZEM CD) 180 MG 24 hr capsule Take 1 capsule (180 mg total) by mouth 2 (two) times daily.   . furosemide (LASIX) 40 MG tablet Take 40 mg by mouth daily.  03/19/2017: Pt only takes a second dose if needed.   . glucose blood (TRUE METRIX BLOOD GLUCOSE TEST) test strip    . insulin lispro (HUMALOG) 100 UNIT/ML injection Inject 2-10 Units into the skin 3 (three) times daily before meals. Based on sliding scale. For BS of 150-200, take 2 units, 201-250, take 4 units, 251-300, take 6 units, 301-350 take 8 units, 351-400 take 10 units. Over 400 contact physician.   . loratadine (CLARITIN) 10 MG tablet Take 10 mg by mouth daily.   . montelukast (SINGULAIR) 10 MG tablet Take 1 tablet (10 mg total) by mouth at bedtime.   . Multiple Vitamins-Minerals (ONE-A-DAY MENOPAUSE FORMULA PO) Take 1 tablet by mouth daily.   . potassium chloride (K-DUR,KLOR-CON) 10 MEQ tablet Take 1 tablet (10 mEq total) by mouth 2 (two) times daily.   . predniSONE (DELTASONE) 20 MG tablet Take 20 mg by mouth daily with breakfast. Pt tapers on her own depending on breathing. Currently 60 mg for 4 days and taper slowly down by 10 mg every 4 days. On day 3 of 60 mg taper.   . theophylline (THEODUR) 300 MG 12 hr tablet TAKE 1 TABLET BY MOUTH ONCE DAILY   . triamcinolone (KENALOG) 0.025 %   cream Apply 1 application topically 2 (two) times daily.   . TRUEPLUS LANCETS 28G MISC     No facility-administered encounter medications on file as of 05/22/2017.     Surgical History: Past Surgical History:  Procedure Laterality Date  . cataract surgery    . CESAREAN SECTION    . CHOLECYSTECTOMY    . ESOPHAGEAL DILATION    . HERNIA REPAIR    . hiatial hernia repair    . NASAL SINUS SURGERY      Medical History: Past Medical History:  Diagnosis Date  . Asthma   . CHF (congestive heart failure) ([place])    1991- unknown after  asthma attack  . COPD (chronic obstructive pulmonary disease) ([place])   . Diabetes ([place])   . GERD (gastroesophageal reflux disease)   . Hypertension   . Hypertension     Family History: Family History  Problem Relation Age of Onset  . Hypertension Mother     Social History: Social History   Socioeconomic History  . Marital status: Married    Spouse name: Not on file  . Number of children: Not on file  . Years of education: Not on file  . Highest education level: Not on file  Social Needs  . Financial resource strain: Not on file  . Food insecurity - worry: Not on file  . Food insecurity - inability: Not on file  . Transportation needs - medical: Not on file  . Transportation needs - non-medical: Not on file  Occupational History  . Not on file  Tobacco Use  . Smoking status: Never Smoker  . Smokeless tobacco: Never Used  Substance and Sexual Activity  . Alcohol use: No  . Drug use: No  . Sexual activity: Not on file  Other Topics Concern  . Not on file  Social History Narrative   Lives at home with family. Independent    Vital Signs: Blood pressure (!) 144/90, pulse (!) 120, resp. rate 16, height 5' 3" (1.6 m), weight 197 lb 6.4 oz (89.5 kg), SpO2 96 %.  Examination: General Appearance: The patient is well-developed, well-nourished, and in no distress. Skin: Gross inspection of skin unremarkable. Head: normocephalic, no gross deformities. Eyes: no gross deformities noted. ENT: ears appear grossly normal no exudates. Neck: Supple. No thyromegaly. No LAD. Respiratory: scattered rhonchi noted. Cardiovascular: Normal S1 and S2 without murmur or rub. Extremities: No cyanosis. pulses are equal. Neurologic: Alert and oriented. No involuntary movements.  LABS: Recent Results (from the past 2160 hour(s))  Basic metabolic panel     Status: Abnormal   Collection Time: 03/19/17  9:18 PM  Result Value Ref Range   Sodium 139 135 - 145 mmol/L   Potassium 4.1 3.5 - 5.1  mmol/L   Chloride 103 101 - 111 mmol/L   CO2 25 22 - 32 mmol/L   Glucose, Bld 122 (H) 65 - 99 mg/dL   BUN 17 6 - 20 mg/dL   Creatinine, Ser 0.74 0.44 - 1.00 mg/dL   Calcium 9.1 8.9 - 10.3 mg/dL   GFR calc non Af Amer >60 >60 mL/min   GFR calc Af Amer >60 >60 mL/min    Comment: (NOTE) The eGFR has been calculated using the CKD EPI equation. This calculation has not been validated in all clinical situations. eGFR's persistently <60 mL/min signify possible Chronic Kidney Disease.    Anion gap 11 5 - 15    Comment: Performed at [facility], [address] [place], [place] [address]  CBC     Status: Abnormal   Collection Time: 03/19/17  9:18 PM  Result Value Ref Range   WBC 12.5 (H) 3.6 - 11.0 K/uL   RBC 4.87 3.80 - 5.20 MIL/uL   Hemoglobin 14.8 12.0 - 16.0 g/dL   HCT 44.7 35.0 - 47.0 %   MCV 91.8 80.0 - 100.0 fL   MCH 30.4 26.0 - 34.0 pg   MCHC 33.1 32.0 - 36.0 g/dL   RDW 14.0 11.5 - 14.5 %   Platelets 285 150 - 440 K/uL    Comment: Performed at [facility], [address] [place], [place] [address]  Troponin I     Status: None   Collection Time: 03/19/17  9:18 PM  Result Value Ref Range   Troponin I <0.03 <0.03 ng/mL    Comment: Performed at [facility], [address] [place], [place] [address]  Blood gas, venous     Status: Abnormal   Collection Time: 03/19/17  9:21 PM  Result Value Ref Range   pH, Ven 7.43 7.250 - 7.430   pCO2, Ven 42 (L) 44.0 - 60.0 mmHg   pO2, Ven 130.0 (H) 32.0 - 45.0 mmHg   Bicarbonate 27.9 20.0 - 28.0 mmol/L   Acid-Base Excess 3.1 (H) 0.0 - 2.0 mmol/L   O2 Saturation 99.0 %   Patient temperature 37.0    Collection site VENOUS    Sample type VENOUS     Comment: Performed at [facility], [address] [place], [place] [address]  Influenza panel by PCR (type A & B)     Status: None   Collection Time: 03/19/17 10:52 PM  Result Value Ref Range   Influenza A By PCR NEGATIVE NEGATIVE   Influenza B By  PCR NEGATIVE NEGATIVE    Comment: (NOTE) The Xpert Xpress Flu assay is intended as an aid in the diagnosis of  influenza and should not be used as a sole basis for treatment.  This  assay is FDA approved for nasopharyngeal swab specimens only. Nasal  washings and aspirates are unacceptable for Xpert Xpress Flu testing. Performed at [facility], [address] [place], [place] [address]   Glucose, capillary     Status: Abnormal   Collection Time: 03/20/17  1:07 AM  Result Value Ref Range   Glucose-Capillary 175 (H) 65 - 99 mg/dL  Basic metabolic panel     Status: Abnormal   Collection Time: 03/20/17  4:45 AM  Result Value Ref Range   Sodium 140 135 - 145 mmol/L   Potassium 3.8 3.5 - 5.1 mmol/L   Chloride 104 101 - 111 mmol/L   CO2 22 22 - 32 mmol/L   Glucose, Bld 186 (H) 65 - 99 mg/dL   BUN 18 6 - 20 mg/dL   Creatinine, Ser 0.59 0.44 - 1.00 mg/dL   Calcium 8.5 (L) 8.9 - 10.3 mg/dL   GFR calc non Af Amer >60 >60 mL/min   GFR calc Af Amer >60 >60 mL/min    Comment: (NOTE) The eGFR has been calculated using the CKD EPI equation. This calculation has not been validated in all clinical situations. eGFR's persistently <60 mL/min signify possible Chronic Kidney Disease.    Anion gap 14 5 - 15    Comment: Performed at [facility], [address] [place], [place] [address]  CBC     Status: None   Collection Time: 03/20/17  4:45 AM  Result Value Ref Range   WBC   8.7 3.6 - 11.0 K/uL   RBC 4.36 3.80 - 5.20 MIL/uL   Hemoglobin 13.5 12.0 - 16.0 g/dL   HCT 39.9 35.0 - 47.0 %   MCV 91.6 80.0 - 100.0 fL   MCH 30.9 26.0 - 34.0 pg   MCHC 33.7 32.0 - 36.0 g/dL   RDW 13.8 11.5 - 14.5 %   Platelets 237 150 - 440 K/uL    Comment: Performed at [facility], [address] [place], [place] [address]  Glucose, capillary     Status: Abnormal   Collection Time: 03/20/17  7:51 AM  Result Value Ref Range   Glucose-Capillary 169 (H) 65 - 99 mg/dL  Glucose,  capillary     Status: Abnormal   Collection Time: 03/20/17 12:09 PM  Result Value Ref Range   Glucose-Capillary 220 (H) 65 - 99 mg/dL  POCT HgB A1C     Status: None   Collection Time: 05/19/17  1:00 PM  Result Value Ref Range   Hemoglobin A1C 6.3     Radiology: Dg Chest Port 1 View  Result Date: 03/19/2017 CLINICAL DATA:  57 year old female with shortness of breath. EXAM: PORTABLE CHEST 1 VIEW COMPARISON:  Chest CT dated 08/19/2016 FINDINGS: The heart size and mediastinal contours are within normal limits. Both lungs are clear. The visualized skeletal structures are unremarkable. IMPRESSION: No active disease. Electronically Signed   By: Anner Crete M.D.   On: 03/19/2017 21:43    No results found.  No results found.    Assessment and Plan: Patient Active Problem List   Diagnosis Date Noted  . Influenza A 04/22/2016  . HTN (hypertension) 04/21/2016  . Diabetes ([place]) 04/21/2016  . GERD (gastroesophageal reflux disease) 04/21/2016  . Pneumonia 02/26/2016  . Asthma exacerbation 02/21/2016  . Primary osteoarthritis of left knee 12/27/2014  . DDD (degenerative disc disease), lumbar 09/28/2013  . Lumbar radiculitis 09/28/2013    1. Chronic Asthma as she has severe asthma by history and has been on off of steroids.  She will continue with the steroid taper that she has started.  In addition will give prescription for azithromycin. In addition will add antihistamine to her regimen with beginning of allergy season 2. Acute bronchitis  Zithromax on Z-Pak will be given for acute bronchitis likely the cause of her flareup 3. GERD currently controlled she will continue with the patient's supportive care  General Counseling: I have discussed the findings of the evaluation and examination with Neoma Laming.  I have also discussed any further diagnostic evaluation thatmay be needed or ordered today. Nylee verbalizes understanding of the findings of todays visit. We also reviewed her  medications today and discussed drug interactions and side effects including but not limited excessive drowsiness and altered mental states. We also discussed that there is always a risk not just to her but also people around her. she has been encouraged to call the office with any questions or concerns that should arise related to todays visit.    Time spent: 4mn  I have personally obtained a history, examined the patient, evaluated laboratory and imaging results, formulated the assessment and plan and placed orders.    SAllyne Gee MD [facility] CampusPulmonary and Critical Care Sleep medicine

## 2017-05-22 NOTE — Patient Instructions (Signed)

## 2017-05-26 DIAGNOSIS — H3554 Dystrophies primarily involving the retinal pigment epithelium: Secondary | ICD-10-CM | POA: Diagnosis not present

## 2017-05-31 ENCOUNTER — Other Ambulatory Visit: Payer: Self-pay | Admitting: Internal Medicine

## 2017-06-04 ENCOUNTER — Other Ambulatory Visit: Payer: Self-pay

## 2017-06-05 ENCOUNTER — Other Ambulatory Visit: Payer: Self-pay

## 2017-06-05 MED ORDER — THEOPHYLLINE ER 300 MG PO TB12: 300.0000 mg | ORAL_TABLET | Freq: Every day | ORAL | 1 refills | Status: DC

## 2017-06-08 DIAGNOSIS — J45909 Unspecified asthma, uncomplicated: Secondary | ICD-10-CM | POA: Insufficient documentation

## 2017-06-08 DIAGNOSIS — L209 Atopic dermatitis, unspecified: Secondary | ICD-10-CM | POA: Insufficient documentation

## 2017-06-08 DIAGNOSIS — E876 Hypokalemia: Secondary | ICD-10-CM | POA: Insufficient documentation

## 2017-06-08 DIAGNOSIS — E1165 Type 2 diabetes mellitus with hyperglycemia: Secondary | ICD-10-CM | POA: Insufficient documentation

## 2017-06-17 ENCOUNTER — Other Ambulatory Visit: Payer: Self-pay | Admitting: Nurse Practitioner

## 2017-06-17 ENCOUNTER — Other Ambulatory Visit: Payer: Self-pay | Admitting: Internal Medicine

## 2017-06-17 DIAGNOSIS — F411 Generalized anxiety disorder: Secondary | ICD-10-CM

## 2017-06-17 MED ORDER — ALPRAZOLAM 0.5 MG PO TABS: ORAL_TABLET | ORAL | 2 refills | Status: DC

## 2017-06-17 NOTE — Progress Notes (Signed)
Refilled alprazolam 0.5mg  bid prn and sent new rx to [address].

## 2017-06-17 NOTE — Telephone Encounter (Signed)
Refilled alprazolam 0.5mg  bid prn and sent new rx to [address].

## 2017-06-17 NOTE — Telephone Encounter (Signed)
Can you please send her xanax

## 2017-07-28 ENCOUNTER — Other Ambulatory Visit: Payer: Self-pay | Admitting: Internal Medicine

## 2017-08-14 ENCOUNTER — Ambulatory Visit (INDEPENDENT_AMBULATORY_CARE_PROVIDER_SITE_OTHER): Payer: PPO | Admitting: Internal Medicine

## 2017-08-14 ENCOUNTER — Encounter: Payer: Self-pay | Admitting: Internal Medicine

## 2017-08-14 VITALS — BP 120/88 | HR 91 | Resp 16 | Ht 63.0 in | Wt 202.0 lb

## 2017-08-14 DIAGNOSIS — K219 Gastro-esophageal reflux disease without esophagitis: Secondary | ICD-10-CM

## 2017-08-14 DIAGNOSIS — J301 Allergic rhinitis due to pollen: Secondary | ICD-10-CM

## 2017-08-14 DIAGNOSIS — J4520 Mild intermittent asthma, uncomplicated: Secondary | ICD-10-CM | POA: Diagnosis not present

## 2017-08-14 DIAGNOSIS — E11649 Type 2 diabetes mellitus with hypoglycemia without coma: Secondary | ICD-10-CM

## 2017-08-14 LAB — POCT GLYCOSYLATED HEMOGLOBIN (HGB A1C): Hemoglobin A1C: 6.9 % — AB (ref 4.0–5.6)

## 2017-08-14 NOTE — Progress Notes (Signed)
[facility] [place], [place] [address]  Pulmonary Sleep Medicine   Office Visit Note  Patient Name: Cassie Ross DOB: 25-Aug-1960 MRN [id number]  Date of Service: 08/14/2017  Complaints/HPI: She is here for follow-up of chronic obstructive asthma.  She is doing well.  Has had no flareups no admissions to the hospital.  Denies having any cough congestion at this time.  No chest pain is noted.  She does have some localized wheezing which is her usual location.  She has not had any increased need of inhalers at this time.  She does have some increased allergy symptoms which is normal for her during this time of the year.  ROS  General: (-) fever, (-) chills, (-) night sweats, (-) weakness Skin: (-) rashes, (-) itching,. Eyes: (-) visual changes, (-) redness, (-) itching. Nose and Sinuses: (-) nasal stuffiness or itchiness, (-) postnasal drip, (-) nosebleeds, (-) sinus trouble. Mouth and Throat: (-) sore throat, (-) hoarseness. Neck: (-) swollen glands, (-) enlarged thyroid, (-) neck pain. Respiratory: + cough, (-) bloody sputum, + shortness of breath, - wheezing. Cardiovascular: - ankle swelling, (-) chest pain. Lymphatic: (-) lymph node enlargement. Neurologic: (-) numbness, (-) tingling. Psychiatric: (-) anxiety, (-) depression   Current Medication: Outpatient Encounter Medications as of 08/14/2017  Medication Sig Note  . albuterol (PROAIR HFA) 108 (90 Base) MCG/ACT inhaler Inhale 2 puffs into the lungs every 4 (four) hours as needed for wheezing or shortness of breath.   Marland Kitchen albuterol (PROVENTIL) (2.5 MG/3ML) 0.083% nebulizer solution USE 1 VIAL IN NEBULIZER EVERY 6 HOURS AS NEEDED   . ALPRAZolam (XANAX) 0.5 MG tablet TAKE 1/2 (ONE-HALF) TABLET BY MOUTH IN THE MORNING AND TAKE 1 TABLET IN THE EVENING.   . ALPRAZolam (XANAX) 0.5 MG tablet Take 1 tablet po bid prn anxiety   . aspirin EC 81 MG tablet Take 81 mg by mouth daily.   Marland Kitchen atorvastatin (LIPITOR) 10  MG tablet Take 1 tablet (10 mg total) by mouth daily.   Marland Kitchen azithromycin (ZITHROMAX) 250 MG tablet 1 zpk as directed   . budesonide-formoterol (SYMBICORT) 160-4.5 MCG/ACT inhaler Inhale 1 puff into the lungs 2 (two) times daily.   . Calcium Citrate-Vitamin D (CALCIUM + D PO) Take 2 tablets by mouth 2 (two) times daily.   . Canagliflozin-Metformin HCl (INVOKAMET) 150-500 MG TABS Take 1 tablet by mouth at bedtime.   . Chlorpheniramine-APAP (CORICIDIN) 2-325 MG TABS Take 2 tablets by mouth daily as needed.   . diltiazem (CARDIZEM CD) 180 MG 24 hr capsule Take 1 capsule (180 mg total) by mouth 2 (two) times daily.   . furosemide (LASIX) 40 MG tablet Take 40 mg by mouth daily.  03/19/2017: Pt only takes a second dose if needed.   Marland Kitchen glucose blood (TRUE METRIX BLOOD GLUCOSE TEST) test strip    . insulin lispro (HUMALOG) 100 UNIT/ML injection Inject 2-10 Units into the skin 3 (three) times daily before meals. Based on sliding scale. For BS of 150-200, take 2 units, 201-250, take 4 units, 251-300, take 6 units, 301-350 take 8 units, 351-400 take 10 units. Over 400 contact physician.   . loratadine (CLARITIN) 10 MG tablet Take 10 mg by mouth daily.   . montelukast (SINGULAIR) 10 MG tablet Take 1 tablet (10 mg total) by mouth at bedtime.   . Multiple Vitamins-Minerals (ONE-A-DAY MENOPAUSE FORMULA PO) Take 1 tablet by mouth daily.   . potassium chloride (K-DUR,KLOR-CON) 10 MEQ tablet Take 1 tablet (10 mEq  total) by mouth 2 (two) times daily.   . predniSONE (DELTASONE) 20 MG tablet Take 20 mg by mouth daily with breakfast. Pt tapers on her own depending on breathing. Currently 60 mg for 4 days and taper slowly down by 10 mg every 4 days. On day 3 of 60 mg taper.   . theophylline (THEODUR) 300 MG 12 hr tablet Take 1 tablet (300 mg total) by mouth daily.   Marland Kitchen triamcinolone (KENALOG) 0.025 % cream Apply 1 application topically 2 (two) times daily.   . TRUEPLUS LANCETS 28G MISC     No facility-administered encounter  medications on file as of 08/14/2017.     Surgical History: Past Surgical History:  Procedure Laterality Date  . cataract surgery    . CESAREAN SECTION    . CHOLECYSTECTOMY    . ESOPHAGEAL DILATION    . HERNIA REPAIR    . hiatial hernia repair    . NASAL SINUS SURGERY      Medical History: Past Medical History:  Diagnosis Date  . Asthma   . CHF (congestive heart failure) ([place])    1991- unknown after asthma attack  . COPD (chronic obstructive pulmonary disease) ([place])   . Diabetes ([place])   . GERD (gastroesophageal reflux disease)   . Hypertension   . Hypertension     Family History: Family History  Problem Relation Age of Onset  . Hypertension Mother     Social History: Social History   Socioeconomic History  . Marital status: Married    Spouse name: Not on file  . Number of children: Not on file  . Years of education: Not on file  . Highest education level: Not on file  Occupational History  . Not on file  Social Needs  . Financial resource strain: Not on file  . Food insecurity:    Worry: Not on file    Inability: Not on file  . Transportation needs:    Medical: Not on file    Non-medical: Not on file  Tobacco Use  . Smoking status: Never Smoker  . Smokeless tobacco: Never Used  Substance and Sexual Activity  . Alcohol use: No  . Drug use: No  . Sexual activity: Not on file  Lifestyle  . Physical activity:    Days per week: Not on file    Minutes per session: Not on file  . Stress: Not on file  Relationships  . Social connections:    Talks on phone: Not on file    Gets together: Not on file    Attends religious service: Not on file    Active member of club or organization: Not on file    Attends meetings of clubs or organizations: Not on file    Relationship status: Not on file  . Intimate partner violence:    Fear of current or ex partner: Not on file    Emotionally abused: Not on file    Physically abused: Not on file    Forced sexual  activity: Not on file  Other Topics Concern  . Not on file  Social History Narrative   Lives at home with family. Independent    Vital Signs: Blood pressure 120/88, pulse 91, resp. rate 16, height 5\' 3"  (1.6 m), weight 202 lb (91.6 kg), SpO2 95 %.  Examination: General Appearance: The patient is well-developed, well-nourished, and in no distress. Skin: Gross inspection of skin unremarkable. Head: normocephalic, no gross deformities. Eyes: no gross deformities noted. ENT: ears appear grossly  normal no exudates. Neck: Supple. No thyromegaly. No LAD. Respiratory: few rhonchi noted at this time. Cardiovascular: Normal S1 and S2 without murmur or rub. Extremities: No cyanosis. pulses are equal. Neurologic: Alert and oriented. No involuntary movements.  LABS: Recent Results (from the past 2160 hour(s))  POCT HgB A1C     Status: None   Collection Time: 05/19/17  1:00 PM  Result Value Ref Range   Hemoglobin A1C 6.3   POCT HgB A1C     Status: Abnormal   Collection Time: 08/14/17 12:25 PM  Result Value Ref Range   Hemoglobin A1C 6.9 (A) 4.0 - 5.6 %   HbA1c, POC (prediabetic range)  5.7 - 6.4 %   HbA1c, POC (controlled diabetic range)  0.0 - 7.0 %    Radiology: Dg Chest Port 1 View  Result Date: 03/19/2017 CLINICAL DATA:  57 year old female with shortness of breath. EXAM: PORTABLE CHEST 1 VIEW COMPARISON:  Chest CT dated 08/19/2016 FINDINGS: The heart size and mediastinal contours are within normal limits. Both lungs are clear. The visualized skeletal structures are unremarkable. IMPRESSION: No active disease. Electronically Signed   By: Anner Crete M.D.   On: 03/19/2017 21:43    No results found.  No results found.    Assessment and Plan: Patient Active Problem List   Diagnosis Date Noted  . Uncontrolled type 2 diabetes mellitus with hyperglycemia ([place]) 06/08/2017  . Atopic dermatitis 06/08/2017  . Severe asthma without complication 62/13/0865  . Hypokalemia 06/08/2017   . Influenza A 04/22/2016  . HTN (hypertension) 04/21/2016  . Diabetes ([place]) 04/21/2016  . GERD (gastroesophageal reflux disease) 04/21/2016  . Pneumonia 02/26/2016  . Asthma exacerbation 02/21/2016  . Primary osteoarthritis of left knee 12/27/2014  . DDD (degenerative disc disease), lumbar 09/28/2013  . Lumbar radiculitis 09/28/2013    1. Chronic Asthma stable at this time no exacerbations will continue with present management continue with current inhaler regimen as ordered. 2. GERD controlled at this time we will continue with present therapy 3. Allergic Rhinitis stable right now encouraged to use antihistamines as ordered we will continue present therapy  General Counseling: I have discussed the findings of the evaluation and examination with Neoma Laming.  I have also discussed any further diagnostic evaluation thatmay be needed or ordered today. Lizet verbalizes understanding of the findings of todays visit. We also reviewed her medications today and discussed drug interactions and side effects including but not limited excessive drowsiness and altered mental states. We also discussed that there is always a risk not just to her but also people around her. she has been encouraged to call the office with any questions or concerns that should arise related to todays visit.    Time spent: 75min  I have personally obtained a history, examined the patient, evaluated laboratory and imaging results, formulated the assessment and plan and placed orders.    Allyne Gee, MD [facility] Pulmonary and Critical Care Sleep medicine

## 2017-08-14 NOTE — Patient Instructions (Signed)

## 2017-08-20 ENCOUNTER — Other Ambulatory Visit: Payer: Self-pay

## 2017-08-20 MED ORDER — PREDNISONE 10 MG PO TABS: ORAL_TABLET | ORAL | 1 refills | Status: DC

## 2017-08-28 ENCOUNTER — Ambulatory Visit: Payer: Self-pay | Admitting: Internal Medicine

## 2017-09-03 ENCOUNTER — Other Ambulatory Visit: Payer: Self-pay | Admitting: Internal Medicine

## 2017-09-03 DIAGNOSIS — J440 Chronic obstructive pulmonary disease with acute lower respiratory infection: Principal | ICD-10-CM

## 2017-09-03 DIAGNOSIS — J209 Acute bronchitis, unspecified: Secondary | ICD-10-CM

## 2017-09-18 ENCOUNTER — Ambulatory Visit: Payer: Self-pay | Admitting: Nurse Practitioner

## 2017-09-23 ENCOUNTER — Other Ambulatory Visit: Payer: Self-pay

## 2017-09-23 ENCOUNTER — Ambulatory Visit (INDEPENDENT_AMBULATORY_CARE_PROVIDER_SITE_OTHER): Payer: PPO | Admitting: Internal Medicine

## 2017-09-23 ENCOUNTER — Encounter: Payer: Self-pay | Admitting: Nurse Practitioner

## 2017-09-23 VITALS — BP 138/80 | HR 101 | Resp 16 | Ht 63.0 in | Wt 199.8 lb

## 2017-09-23 DIAGNOSIS — E782 Mixed hyperlipidemia: Secondary | ICD-10-CM

## 2017-09-23 DIAGNOSIS — Z1231 Encounter for screening mammogram for malignant neoplasm of breast: Secondary | ICD-10-CM

## 2017-09-23 DIAGNOSIS — R300 Dysuria: Secondary | ICD-10-CM | POA: Diagnosis not present

## 2017-09-23 DIAGNOSIS — I10 Essential (primary) hypertension: Secondary | ICD-10-CM

## 2017-09-23 DIAGNOSIS — M7989 Other specified soft tissue disorders: Secondary | ICD-10-CM

## 2017-09-23 DIAGNOSIS — I739 Peripheral vascular disease, unspecified: Secondary | ICD-10-CM

## 2017-09-23 DIAGNOSIS — Z1211 Encounter for screening for malignant neoplasm of colon: Secondary | ICD-10-CM

## 2017-09-23 DIAGNOSIS — Z0001 Encounter for general adult medical examination with abnormal findings: Secondary | ICD-10-CM

## 2017-09-23 DIAGNOSIS — E1165 Type 2 diabetes mellitus with hyperglycemia: Secondary | ICD-10-CM | POA: Diagnosis not present

## 2017-09-23 DIAGNOSIS — Z1239 Encounter for other screening for malignant neoplasm of breast: Secondary | ICD-10-CM

## 2017-09-23 LAB — POCT GLYCOSYLATED HEMOGLOBIN (HGB A1C): Hemoglobin A1C: 6.5 % — AB (ref 4.0–5.6)

## 2017-09-23 NOTE — Progress Notes (Signed)
[facility] [place], [place] [address]  Internal MEDICINE  Office Visit Note  Patient Name: Cassie Ross  [id number]  [id number]  Date of Service: 09/23/2017  Chief Complaint  Patient presents with  . Health Maintenance    4 month   . Asthma  . Gastroesophageal Reflux  . Hypertension  . Diabetes  . Hyperlipidemia   HPI Pt is here for routine health maintenance examination. Pt has multiple medical problems, baseline controlled. C/o LEE with pain. Diabetes unde rbetetr control, still struggles with her weight. Pt has Best disease and has decreased vision   Current Medication: Outpatient Encounter Medications as of 09/23/2017  Medication Sig Note  . albuterol (PROAIR HFA) 108 (90 Base) MCG/ACT inhaler Inhale 2 puffs into the lungs every 4 (four) hours as needed for wheezing or shortness of breath.   Marland Kitchen albuterol (PROVENTIL) (2.5 MG/3ML) 0.083% nebulizer solution USE 1 VIAL IN NEBULIZER EVERY 6 HOURS AS NEEDED   . ALPRAZolam (XANAX) 0.5 MG tablet TAKE 1/2 (ONE-HALF) TABLET BY MOUTH IN THE MORNING AND TAKE 1 TABLET IN THE EVENING.   . ALPRAZolam (XANAX) 0.5 MG tablet Take 1 tablet po bid prn anxiety   . aspirin EC 81 MG tablet Take 81 mg by mouth daily.   Marland Kitchen atorvastatin (LIPITOR) 10 MG tablet Take 1 tablet (10 mg total) by mouth daily.   Marland Kitchen azithromycin (ZITHROMAX) 250 MG tablet TAKE 1 TABLET BY MOUTH ONCE DAILY   . budesonide-formoterol (SYMBICORT) 160-4.5 MCG/ACT inhaler Inhale 1 puff into the lungs 2 (two) times daily.   . Calcium Citrate-Vitamin D (CALCIUM + D PO) Take 2 tablets by mouth 2 (two) times daily.   . Canagliflozin-Metformin HCl (INVOKAMET) 150-500 MG TABS Take 1 tablet by mouth at bedtime.   . Chlorpheniramine-APAP (CORICIDIN) 2-325 MG TABS Take 2 tablets by mouth daily as needed.   . diltiazem (CARDIZEM CD) 180 MG 24 hr capsule Take 1 capsule (180 mg total) by mouth 2 (two) times daily.   . furosemide (LASIX) 40 MG tablet Take 40 mg by  mouth daily.  03/19/2017: Pt only takes a second dose if needed.   Marland Kitchen glucose blood (TRUE METRIX BLOOD GLUCOSE TEST) test strip    . insulin lispro (HUMALOG) 100 UNIT/ML injection Inject 2-10 Units into the skin 3 (three) times daily before meals. Based on sliding scale. For BS of 150-200, take 2 units, 201-250, take 4 units, 251-300, take 6 units, 301-350 take 8 units, 351-400 take 10 units. Over 400 contact physician.   . loratadine (CLARITIN) 10 MG tablet Take 10 mg by mouth daily.   . montelukast (SINGULAIR) 10 MG tablet Take 1 tablet (10 mg total) by mouth at bedtime.   . Multiple Vitamins-Minerals (ONE-A-DAY MENOPAUSE FORMULA PO) Take 1 tablet by mouth daily.   . potassium chloride (K-DUR,KLOR-CON) 10 MEQ tablet Take 1 tablet (10 mEq total) by mouth 2 (two) times daily.   . predniSONE (DELTASONE) 10 MG tablet Take 2 tab po 3 times a day  As needed total upto 60 mg   . theophylline (THEODUR) 300 MG 12 hr tablet Take 1 tablet (300 mg total) by mouth daily.   Marland Kitchen triamcinolone (KENALOG) 0.025 % cream Apply 1 application topically 2 (two) times daily.   . TRUEPLUS LANCETS 28G MISC     No facility-administered encounter medications on file as of 09/23/2017.     Surgical History: Past Surgical History:  Procedure Laterality Date  . cataract surgery    .  CESAREAN SECTION    . CHOLECYSTECTOMY    . ESOPHAGEAL DILATION    . HERNIA REPAIR    . hiatial hernia repair    . NASAL SINUS SURGERY      Medical History: Past Medical History:  Diagnosis Date  . Asthma   . CHF (congestive heart failure) ([place])    1991- unknown after asthma attack  . COPD (chronic obstructive pulmonary disease) ([place])   . Diabetes ([place])   . GERD (gastroesophageal reflux disease)   . Hypertension   . Hypertension     Family History: Family History  Problem Relation Age of Onset  . Hypertension Mother    Review of Systems  Constitutional: Negative for chills, diaphoresis and fatigue.  HENT: Negative for ear pain,  postnasal drip and sinus pressure.   Eyes: Negative for photophobia, discharge, redness, itching and visual disturbance.  Respiratory: Negative for cough, shortness of breath and wheezing.   Cardiovascular: Positive for leg swelling. Negative for chest pain and palpitations.  Gastrointestinal: Negative for abdominal pain, constipation, diarrhea, nausea and vomiting.  Genitourinary: Negative for dysuria and flank pain.  Musculoskeletal: Negative for arthralgias, back pain, gait problem and neck pain.  Skin: Negative for color change.  Allergic/Immunologic: Negative for environmental allergies and food allergies.  Neurological: Negative for dizziness and headaches.  Hematological: Does not bruise/bleed easily.  Psychiatric/Behavioral: Negative for agitation, behavioral problems (depression) and hallucinations.   Vital Signs: BP 138/80 (BP Location: Left Arm, Patient Position: Sitting, Cuff Size: Large)   Pulse (!) 101   Resp 16   Ht 5\' 3"  (1.6 m)   Wt 199 lb 12.8 oz (90.6 kg)   SpO2 93%   BMI 35.39 kg/m    Physical Exam  Constitutional: She is oriented to person, place, and time. She appears well-developed and well-nourished. No distress.  HENT:  Head: Normocephalic and atraumatic.  Mouth/Throat: Oropharynx is clear and moist. No oropharyngeal exudate.  Eyes: Pupils are equal, round, and reactive to light. EOM are normal.  Neck: Normal range of motion. Neck supple. No JVD present. No tracheal deviation present. No thyromegaly present.  Cardiovascular: Normal rate, regular rhythm and normal heart sounds. Exam reveals no gallop and no friction rub.  No murmur heard. Pulmonary/Chest: Effort normal. No respiratory distress. She has no wheezes. She has no rales. She exhibits no tenderness. Right breast exhibits no inverted nipple and no mass. Left breast exhibits no inverted nipple and no mass. No breast swelling.  Abdominal: Soft. Bowel sounds are normal.  Musculoskeletal: Normal range  of motion.  Lymphadenopathy:    She has no cervical adenopathy.  Neurological: She is alert and oriented to person, place, and time. No cranial nerve deficit.  Skin: Skin is warm and dry. She is not diaphoretic.  Psychiatric: She has a normal mood and affect. Her behavior is normal. Judgment and thought content normal.   LABS: Recent Results (from the past 2160 hour(s))  POCT HgB A1C     Status: Abnormal   Collection Time: 08/14/17 12:25 PM  Result Value Ref Range   Hemoglobin A1C 6.9 (A) 4.0 - 5.6 %   HbA1c, POC (prediabetic range)  5.7 - 6.4 %   HbA1c, POC (controlled diabetic range)  0.0 - 7.0 %  POCT HgB A1C     Status: Abnormal   Collection Time: 09/23/17 12:18 PM  Result Value Ref Range   Hemoglobin A1C 6.5 (A) 4.0 - 5.6 %   HbA1c POC (<> result, manual entry)  4.0 - 5.6 %  HbA1c, POC (prediabetic range)  5.7 - 6.4 %   HbA1c, POC (controlled diabetic range)  0.0 - 7.0 %    Assessment/Plan:   General Counseling: Sayre verbalizes understanding of the findings of todays visit and agrees with plan of treatment. I have discussed any further diagnostic evaluation that may be needed or ordered today. We also reviewed her medications today. she has been encouraged to call the office with any questions or concerns that should arise related to todays visit.  Diabetes Counseling:  1. Addition of ACE inh/ ARB'S for nephroprotection. Microalbumin is updated  2. Diabetic foot care, prevention of complications. Podiatry consult 3. Exercise and lose weight.  4. Diabetic eye examination, Diabetic eye exam is updated  5. Monitor blood sugar closlely. nutrition counseling.  6. Sign and symptoms of hypoglycemia including shaking sweating,confusion and headaches.    Orders Placed This Encounter  Procedures  . MM DIGITAL SCREENING BILATERAL  . Pneumococcal conjugate vaccine 13-valent  . UA/M w/rflx Culture, Routine  . Microalbumin, urine  . CBC with Differential/Platelet  . Lipid  Panel With LDL/HDL Ratio  . TSH  . T4, free  . Comprehensive metabolic panel  . Ambulatory referral to Podiatry  . Ambulatory referral to Gastroenterology  . POCT HgB A1C  . Ankle Brachial Index Assessment (BFP)  . POCT ABI Screening Pilot No Charge    Time spent:25 Minutes  Lavera Guise, MD  Internal Medicine

## 2017-09-26 LAB — MICROSCOPIC EXAMINATION: Casts: NONE SEEN /lpf

## 2017-09-26 LAB — UA/M W/RFLX CULTURE, ROUTINE
Bilirubin, UA: NEGATIVE
Ketones, UA: NEGATIVE
Nitrite, UA: NEGATIVE
Protein, UA: NEGATIVE
Specific Gravity, UA: 1.024 (ref 1.005–1.030)
Urobilinogen, Ur: 1 mg/dL (ref 0.2–1.0)
pH, UA: 6.5 (ref 5.0–7.5)

## 2017-09-26 LAB — URINE CULTURE, REFLEX

## 2017-10-16 DIAGNOSIS — E119 Type 2 diabetes mellitus without complications: Secondary | ICD-10-CM | POA: Diagnosis not present

## 2017-10-16 DIAGNOSIS — Z794 Long term (current) use of insulin: Secondary | ICD-10-CM | POA: Diagnosis not present

## 2017-10-16 DIAGNOSIS — M2012 Hallux valgus (acquired), left foot: Secondary | ICD-10-CM | POA: Diagnosis not present

## 2017-10-20 ENCOUNTER — Other Ambulatory Visit: Payer: Self-pay | Admitting: Internal Medicine

## 2017-10-20 MED ORDER — DILTIAZEM HCL ER COATED BEADS 180 MG PO CP24: 180.0000 mg | ORAL_CAPSULE | Freq: Two times a day (BID) | ORAL | 1 refills | Status: DC

## 2017-10-24 ENCOUNTER — Other Ambulatory Visit: Payer: Self-pay | Admitting: Nurse Practitioner

## 2017-10-24 MED ORDER — ALPRAZOLAM 0.5 MG PO TABS: ORAL_TABLET | ORAL | 1 refills | Status: DC

## 2017-11-07 ENCOUNTER — Other Ambulatory Visit: Payer: Self-pay | Admitting: Gastroenterology

## 2017-11-07 DIAGNOSIS — R1314 Dysphagia, pharyngoesophageal phase: Secondary | ICD-10-CM

## 2017-11-07 DIAGNOSIS — K210 Gastro-esophageal reflux disease with esophagitis: Secondary | ICD-10-CM | POA: Diagnosis not present

## 2017-11-07 DIAGNOSIS — Z86010 Personal history of colonic polyps: Secondary | ICD-10-CM | POA: Diagnosis not present

## 2017-11-07 IMAGING — CR DG CHEST 2V
2 series · 2 of 2 positions shown · non-contrast
Comparison: 02/21/2016

CLINICAL DATA: SOB, Dyspnea that started this AM. Patient states
that it is an asthma attack.. Hx: Non-smoker. COPD, CHF, HTN, and is
diabetic when on prednisone according to patient's husband.

EXAM:
CHEST  2 VIEW

[chest lat]
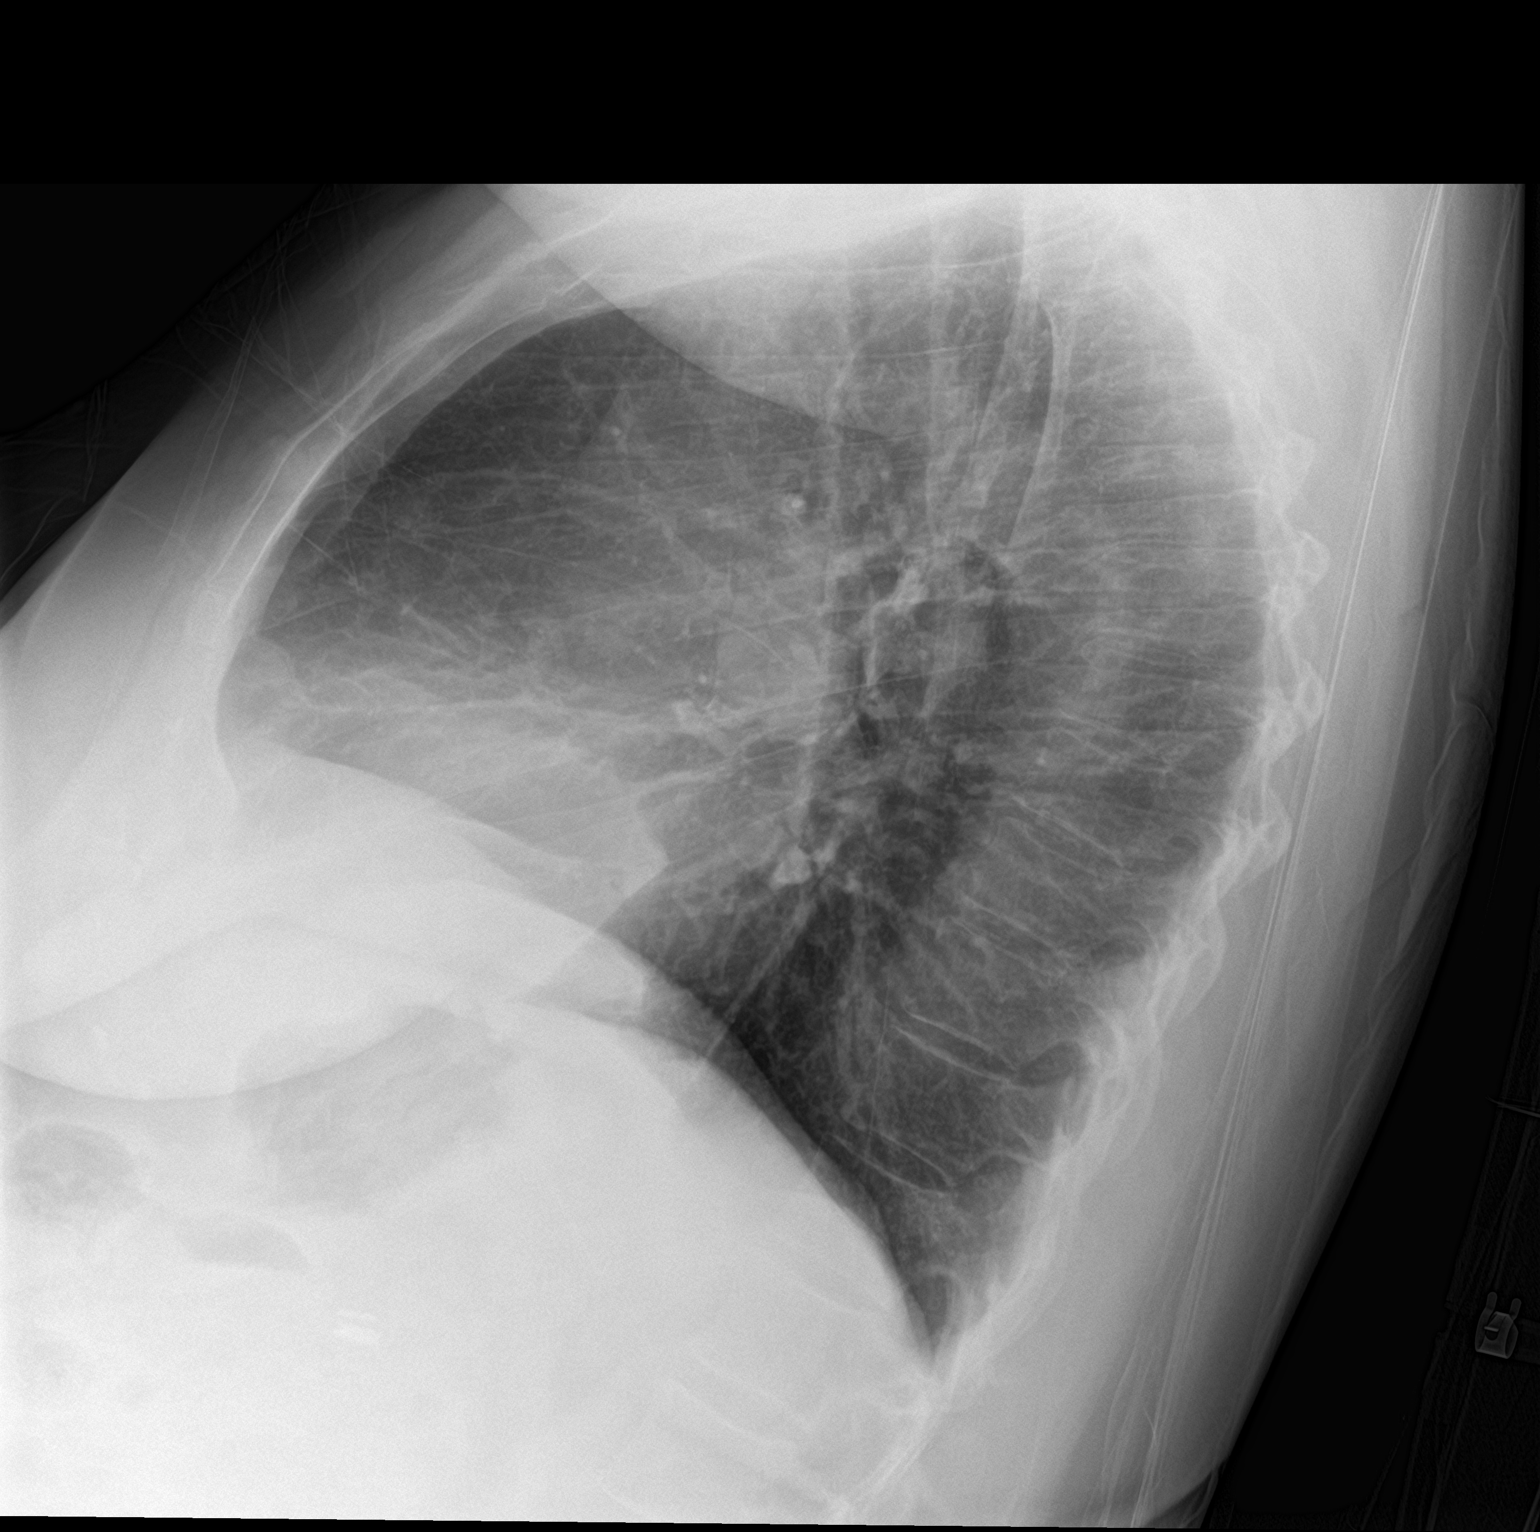

[chest ap]
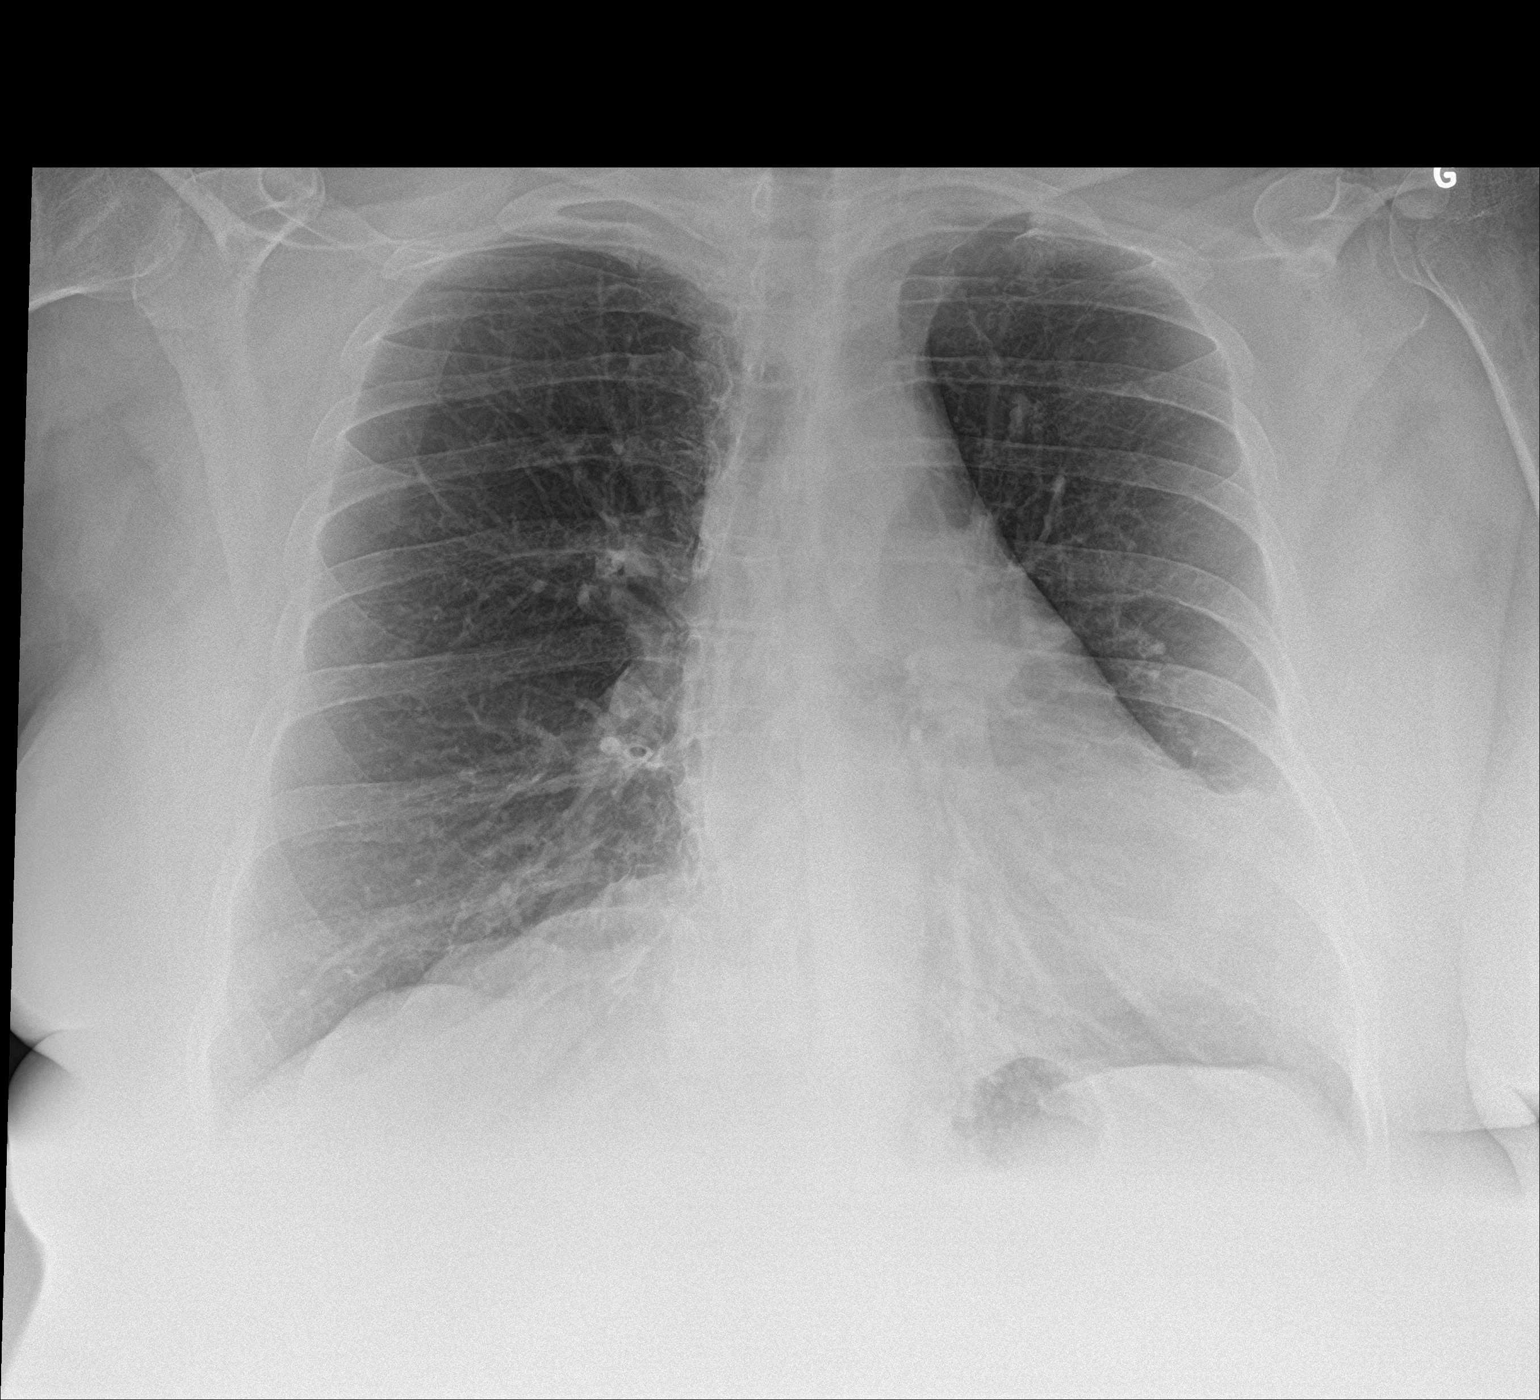

[2 of 2 positions shown; findings below may reference images not displayed]

FINDINGS: Airway thickening is present, suggesting bronchitis or reactive
airways disease. Accentuated lingular opacity favoring atelectasis
or pneumonia over epicardial adipose tissue given the prominence.
Reverse lordotic projection complicates this assessment.

The lungs appear otherwise clear.
IMPRESSION: 1. Airway thickening is present, suggesting bronchitis or reactive
airways disease.
2. Difficult to exclude airspace opacity along the lingula. The
projection is reverse lordotic on the frontal view, which might tend
to exaggerate the epicardial adipose tissue. Correlate with anterior
breath sounds along the left lung base.

## 2017-11-12 ENCOUNTER — Ambulatory Visit
Admission: RE | Admit: 2017-11-12 | Discharge: 2017-11-12 | Disposition: A | Discharge status: Home / Self Care | Payer: PPO | Source: Ambulatory Visit | Attending: Gastroenterology | Admitting: Gastroenterology

## 2017-11-12 DIAGNOSIS — R1314 Dysphagia, pharyngoesophageal phase: Secondary | ICD-10-CM | POA: Diagnosis not present

## 2017-11-12 DIAGNOSIS — K449 Diaphragmatic hernia without obstruction or gangrene: Secondary | ICD-10-CM | POA: Insufficient documentation

## 2017-11-24 DIAGNOSIS — E119 Type 2 diabetes mellitus without complications: Secondary | ICD-10-CM | POA: Diagnosis not present

## 2017-12-01 ENCOUNTER — Encounter: Payer: Self-pay | Admitting: Nurse Practitioner

## 2017-12-01 ENCOUNTER — Ambulatory Visit (INDEPENDENT_AMBULATORY_CARE_PROVIDER_SITE_OTHER): Payer: PPO | Admitting: Nurse Practitioner

## 2017-12-01 VITALS — BP 129/79 | HR 72 | Resp 16 | Ht 63.0 in | Wt 195.0 lb

## 2017-12-01 DIAGNOSIS — Z23 Encounter for immunization: Secondary | ICD-10-CM

## 2017-12-01 DIAGNOSIS — I83029 Varicose veins of left lower extremity with ulcer of unspecified site: Secondary | ICD-10-CM

## 2017-12-01 DIAGNOSIS — I10 Essential (primary) hypertension: Secondary | ICD-10-CM

## 2017-12-01 DIAGNOSIS — L97929 Non-pressure chronic ulcer of unspecified part of left lower leg with unspecified severity: Secondary | ICD-10-CM | POA: Diagnosis not present

## 2017-12-01 DIAGNOSIS — R609 Edema, unspecified: Secondary | ICD-10-CM

## 2017-12-01 DIAGNOSIS — I83892 Varicose veins of left lower extremities with other complications: Secondary | ICD-10-CM

## 2017-12-01 NOTE — Progress Notes (Signed)
[facility] [place], [place] [address]  Internal MEDICINE  Office Visit Note  Patient Name: Cassie Ross  [id number]  [id number]  Date of Service: 12/07/2017   Pt is here for a sick visit.  Chief Complaint  Patient presents with  . Bleeding/Bruising    bruised back of the leg/ankle, didnt fall 5 days ago had a pain run down left leg.  more on the left leg on the right lower leg pt stated it looks like a small pink rash bruises not painful, no fever in bruised areas     The patient noted area of bruising at the base of her ankle, left lower extremity. Area has been getting larger. Not really tender. Initially was treated as though it were area of eczema. She has been using this but has not noted any improvement.        Current Medication:  Outpatient Encounter Medications as of 12/01/2017  Medication Sig Note  . albuterol (PROAIR HFA) 108 (90 Base) MCG/ACT inhaler Inhale 2 puffs into the lungs every 4 (four) hours as needed for wheezing or shortness of breath.   Marland Kitchen albuterol (PROVENTIL) (2.5 MG/3ML) 0.083% nebulizer solution USE 1 VIAL IN NEBULIZER EVERY 6 HOURS AS NEEDED   . ALPRAZolam (XANAX) 0.5 MG tablet Take 1 tablet po bid prn anxiety   . ALPRAZolam (XANAX) 0.5 MG tablet TAKE 1/2 (ONE-HALF) TABLET BY MOUTH IN THE MORNING AND TAKE 1 TABLET IN THE EVENING.   Marland Kitchen aspirin EC 81 MG tablet Take 81 mg by mouth daily.   Marland Kitchen atorvastatin (LIPITOR) 10 MG tablet Take 1 tablet (10 mg total) by mouth daily.   Marland Kitchen azithromycin (ZITHROMAX) 250 MG tablet TAKE 1 TABLET BY MOUTH ONCE DAILY   . budesonide-formoterol (SYMBICORT) 160-4.5 MCG/ACT inhaler Inhale 1 puff into the lungs 2 (two) times daily.   . Calcium Citrate-Vitamin D (CALCIUM + D PO) Take 2 tablets by mouth 2 (two) times daily.   . Canagliflozin-Metformin HCl (INVOKAMET) 150-500 MG TABS Take 1 tablet by mouth at bedtime.   . Chlorpheniramine-APAP (CORICIDIN) 2-325 MG TABS Take 2 tablets by mouth daily as  needed.   . diltiazem (CARDIZEM CD) 180 MG 24 hr capsule Take 1 capsule (180 mg total) by mouth 2 (two) times daily.   . furosemide (LASIX) 40 MG tablet Take 40 mg by mouth daily.  03/19/2017: Pt only takes a second dose if needed.   Marland Kitchen glucose blood (TRUE METRIX BLOOD GLUCOSE TEST) test strip    . insulin lispro (HUMALOG) 100 UNIT/ML injection Inject 2-10 Units into the skin 3 (three) times daily before meals. Based on sliding scale. For BS of 150-200, take 2 units, 201-250, take 4 units, 251-300, take 6 units, 301-350 take 8 units, 351-400 take 10 units. Over 400 contact physician.   . loratadine (CLARITIN) 10 MG tablet Take 10 mg by mouth daily.   . montelukast (SINGULAIR) 10 MG tablet Take 1 tablet (10 mg total) by mouth at bedtime.   . Multiple Vitamins-Minerals (ONE-A-DAY MENOPAUSE FORMULA PO) Take 1 tablet by mouth daily.   . Pantoprazole Sodium (PROTONIX PO) Take by mouth.   . potassium chloride (K-DUR,KLOR-CON) 10 MEQ tablet Take 1 tablet (10 mEq total) by mouth 2 (two) times daily.   . theophylline (THEODUR) 300 MG 12 hr tablet Take 1 tablet (300 mg total) by mouth daily.   Marland Kitchen triamcinolone (KENALOG) 0.025 % cream Apply 1 application topically 2 (two) times daily.   . TRUEPLUS LANCETS  28G MISC    . predniSONE (DELTASONE) 10 MG tablet Take 2 tab po 3 times a day  As needed total upto 60 mg (Patient not taking: Reported on 12/01/2017)    No facility-administered encounter medications on file as of 12/01/2017.       Medical History: Past Medical History:  Diagnosis Date  . Asthma   . CHF (congestive heart failure) ([place])    1991- unknown after asthma attack  . COPD (chronic obstructive pulmonary disease) ([place])   . Diabetes ([place])   . GERD (gastroesophageal reflux disease)   . Hypertension   . Hypertension      Vital Signs: BP 129/79 (BP Location: Right Arm, Patient Position: Sitting, Cuff Size: Large)   Pulse 72   Resp 16   Ht 5\' 3"  (1.6 m)   Wt 195 lb (88.5 kg)   SpO2 94%    BMI 34.54 kg/m    Review of Systems  Constitutional: Negative for chills, diaphoresis, fatigue and unexpected weight change.  HENT: Negative for ear pain, postnasal drip, sinus pressure and sore throat.   Eyes: Negative.  Negative for photophobia, discharge, redness, itching and visual disturbance.  Respiratory: Negative for cough, chest tightness, shortness of breath and wheezing.   Cardiovascular: Positive for leg swelling. Negative for chest pain and palpitations.       Now with area of bruising at the base of the lower leg, right above the heel.   Gastrointestinal: Negative for abdominal pain, constipation, diarrhea, nausea and vomiting.  Endocrine: Negative for cold intolerance, heat intolerance, polydipsia, polyphagia and polyuria.  Genitourinary: Negative.  Negative for dysuria and flank pain.  Musculoskeletal: Positive for myalgias. Negative for arthralgias, back pain, gait problem and neck pain.  Skin: Negative for color change.  Allergic/Immunologic: Negative for environmental allergies and food allergies.  Neurological: Negative for dizziness and headaches.  Hematological: Negative for adenopathy. Does not bruise/bleed easily.  Psychiatric/Behavioral: Negative for agitation, behavioral problems (depression) and hallucinations. The patient is nervous/anxious.     Physical Exam  Constitutional: She is oriented to person, place, and time. She appears well-developed and well-nourished. No distress.  HENT:  Head: Normocephalic and atraumatic.  Nose: Nose normal.  Mouth/Throat: Oropharynx is clear and moist. No oropharyngeal exudate.  Eyes: Pupils are equal, round, and reactive to light. Conjunctivae and EOM are normal.  Neck: Normal range of motion. Neck supple. No JVD present. No tracheal deviation present. No thyromegaly present.  Cardiovascular: Normal rate, regular rhythm and normal heart sounds. Exam reveals no gallop and no friction rub.  No murmur heard. There is mild,  bilateral lower extremity swelling, more severe on the left side. Pedal pulse are palpable, bilaterally.   Pulmonary/Chest: Effort normal and breath sounds normal. No respiratory distress. She has no wheezes. She has no rales. She exhibits no tenderness. Right breast exhibits no inverted nipple and no mass. Left breast exhibits no inverted nipple and no mass. No breast swelling.  Abdominal: Soft. Bowel sounds are normal. There is no tenderness.  Genitourinary: No breast swelling.  Musculoskeletal: Normal range of motion.  Lymphadenopathy:    She has no cervical adenopathy.  Neurological: She is alert and oriented to person, place, and time. No cranial nerve deficit.  Skin: Skin is warm and dry. Capillary refill takes 2 to 3 seconds. She is not diaphoretic.  Area of bruising present on posterior lower left leg, just above the heel. Skin in this area is slightly rough in texture ad non tender to palpated.  Psychiatric: She has a normal mood and affect. Her behavior is normal. Judgment and thought content normal.  Nursing note and vitals reviewed.  Assessment/Plan: 1. Venous stasis ulcer of left lower leg with edema of left lower leg (HCC) Will get venous ultrasound of left lower leg for further evaluation. Will refer to vein and vascular as indicated.  - US Venous Img Lower Unilateral Left; Future  2. Essential hypertension Stable. Continue bp medication as prescribed .  3. Needs flu shot Flu vaccine administered today.  General Counseling: yadira hada understanding of the findings of todays visit and agrees with plan of treatment. I have discussed any further diagnostic evaluation that may be needed or ordered today. We also reviewed her medications today. she has been encouraged to call the office with any questions or concerns that should arise related to todays visit.    Counseling:  This patient was seen by Leretha Pol FNP Collaboration with Dr Lavera Guise as a part of  collaborative care agreement  Orders Placed This Encounter  Procedures  . US Venous Img Lower Unilateral Left      Time spent: 15 Minutes

## 2017-12-04 DIAGNOSIS — I10 Essential (primary) hypertension: Secondary | ICD-10-CM | POA: Diagnosis not present

## 2017-12-04 DIAGNOSIS — E1165 Type 2 diabetes mellitus with hyperglycemia: Secondary | ICD-10-CM | POA: Diagnosis not present

## 2017-12-04 DIAGNOSIS — E782 Mixed hyperlipidemia: Secondary | ICD-10-CM | POA: Diagnosis not present

## 2017-12-05 LAB — CBC WITH DIFFERENTIAL/PLATELET
BASOS ABS: 0.1 10*3/uL (ref 0.0–0.2)
BASOS: 1 %
EOS (ABSOLUTE): 0.3 10*3/uL (ref 0.0–0.4)
Eos: 3 %
HEMATOCRIT: 41.5 % (ref 34.0–46.6)
HEMOGLOBIN: 14.1 g/dL (ref 11.1–15.9)
IMMATURE GRANS (ABS): 0.3 10*3/uL — AB (ref 0.0–0.1)
Immature Granulocytes: 3 %
LYMPHS: 27 %
Lymphocytes Absolute: 2.8 10*3/uL (ref 0.7–3.1)
MCH: 30.5 pg (ref 26.6–33.0)
MCHC: 34 g/dL (ref 31.5–35.7)
MCV: 90 fL (ref 79–97)
MONOCYTES: 9 %
Monocytes Absolute: 0.9 10*3/uL (ref 0.1–0.9)
NEUTROS ABS: 6 10*3/uL (ref 1.4–7.0)
Neutrophils: 57 %
Platelets: 347 10*3/uL (ref 150–450)
RBC: 4.63 x10E6/uL (ref 3.77–5.28)
RDW: 13.5 % (ref 12.3–15.4)
WBC: 10.3 10*3/uL (ref 3.4–10.8)

## 2017-12-05 LAB — COMPREHENSIVE METABOLIC PANEL
A/G RATIO: 2.4 — AB (ref 1.2–2.2)
ALT: 16 IU/L (ref 0–32)
AST: 13 IU/L (ref 0–40)
Albumin: 4.6 g/dL (ref 3.5–5.5)
Alkaline Phosphatase: 100 IU/L (ref 39–117)
BILIRUBIN TOTAL: 0.5 mg/dL (ref 0.0–1.2)
BUN/Creatinine Ratio: 24 — ABNORMAL HIGH (ref 9–23)
BUN: 18 mg/dL (ref 6–24)
CALCIUM: 9.5 mg/dL (ref 8.7–10.2)
CHLORIDE: 102 mmol/L (ref 96–106)
CO2: 27 mmol/L (ref 20–29)
Creatinine, Ser: 0.74 mg/dL (ref 0.57–1.00)
GFR calc Af Amer: 103 mL/min/{1.73_m2} (ref >59)
GFR, EST NON AFRICAN AMERICAN: 90 mL/min/{1.73_m2} (ref >59)
GLOBULIN, TOTAL: 1.9 g/dL (ref 1.5–4.5)
Glucose: 71 mg/dL (ref 65–99)
POTASSIUM: 4.2 mmol/L (ref 3.5–5.2)
SODIUM: 147 mmol/L — AB (ref 134–144)
Total Protein: 6.5 g/dL (ref 6.0–8.5)

## 2017-12-05 LAB — TSH: TSH: 1.89 u[IU]/mL (ref 0.450–4.500)

## 2017-12-05 LAB — VITAMIN D 25 HYDROXY (VIT D DEFICIENCY, FRACTURES): VIT D 25 HYDROXY: 40.6 ng/mL (ref 30.0–100.0)

## 2017-12-05 LAB — MICROALBUMIN / CREATININE URINE RATIO
Creatinine, Urine: 103.3 mg/dL
MICROALB/CREAT RATIO: 11.7 mg/g{creat} (ref 0.0–30.0)
Microalbumin, Urine: 12.1 ug/mL

## 2017-12-05 LAB — LIPID PANEL WITH LDL/HDL RATIO
CHOLESTEROL TOTAL: 163 mg/dL (ref 100–199)
HDL: 71 mg/dL (ref >39)
LDL CALC: 73 mg/dL (ref 0–99)
LDL/HDL RATIO: 1 ratio (ref 0.0–3.2)
Triglycerides: 95 mg/dL (ref 0–149)
VLDL CHOLESTEROL CAL: 19 mg/dL (ref 5–40)

## 2017-12-05 LAB — T4, FREE: FREE T4: 1.28 ng/dL (ref 0.82–1.77)

## 2017-12-05 LAB — T3: T3, Total: 102 ng/dL (ref 71–180)

## 2017-12-07 DIAGNOSIS — I83029 Varicose veins of left lower extremity with ulcer of unspecified site: Secondary | ICD-10-CM | POA: Insufficient documentation

## 2017-12-07 DIAGNOSIS — Z23 Encounter for immunization: Secondary | ICD-10-CM | POA: Insufficient documentation

## 2017-12-07 DIAGNOSIS — R609 Edema, unspecified: Principal | ICD-10-CM

## 2017-12-07 DIAGNOSIS — L97929 Non-pressure chronic ulcer of unspecified part of left lower leg with unspecified severity: Principal | ICD-10-CM

## 2017-12-07 DIAGNOSIS — I83892 Varicose veins of left lower extremities with other complications: Principal | ICD-10-CM

## 2017-12-08 ENCOUNTER — Other Ambulatory Visit: Payer: Self-pay

## 2017-12-08 MED ORDER — ATORVASTATIN CALCIUM 10 MG PO TABS: 10.0000 mg | ORAL_TABLET | Freq: Every day | ORAL | 1 refills | Status: DC

## 2017-12-08 MED ORDER — MONTELUKAST SODIUM 10 MG PO TABS: 10.0000 mg | ORAL_TABLET | Freq: Every day | ORAL | 1 refills | Status: DC

## 2017-12-12 ENCOUNTER — Ambulatory Visit: Payer: PPO

## 2017-12-12 DIAGNOSIS — I83029 Varicose veins of left lower extremity with ulcer of unspecified site: Secondary | ICD-10-CM

## 2017-12-12 DIAGNOSIS — L97929 Non-pressure chronic ulcer of unspecified part of left lower leg with unspecified severity: Principal | ICD-10-CM

## 2017-12-12 DIAGNOSIS — R600 Localized edema: Secondary | ICD-10-CM | POA: Diagnosis not present

## 2017-12-12 DIAGNOSIS — I83892 Varicose veins of left lower extremities with other complications: Principal | ICD-10-CM

## 2017-12-12 DIAGNOSIS — R609 Edema, unspecified: Principal | ICD-10-CM

## 2017-12-22 ENCOUNTER — Ambulatory Visit: Payer: Self-pay | Admitting: Internal Medicine

## 2017-12-25 ENCOUNTER — Ambulatory Visit: Payer: Self-pay | Admitting: Nurse Practitioner

## 2018-01-01 ENCOUNTER — Other Ambulatory Visit: Payer: Self-pay | Admitting: Internal Medicine

## 2018-01-01 MED ORDER — THEOPHYLLINE ER 300 MG PO TB12: 300.0000 mg | ORAL_TABLET | Freq: Every day | ORAL | 1 refills | Status: DC

## 2018-01-02 ENCOUNTER — Other Ambulatory Visit: Payer: Self-pay

## 2018-01-02 ENCOUNTER — Telehealth: Payer: Self-pay

## 2018-01-02 NOTE — Telephone Encounter (Signed)
Called  Pt and advised pres for theophylline Er 300 for 10 days

## 2018-01-04 IMAGING — CR DG CHEST 2V
2 series · 2 of 2 positions shown · non-contrast
Comparison: Three days ago

CLINICAL DATA: Cough and fever.  Follow-up.

EXAM:
CHEST  2 VIEW

[chest pa]
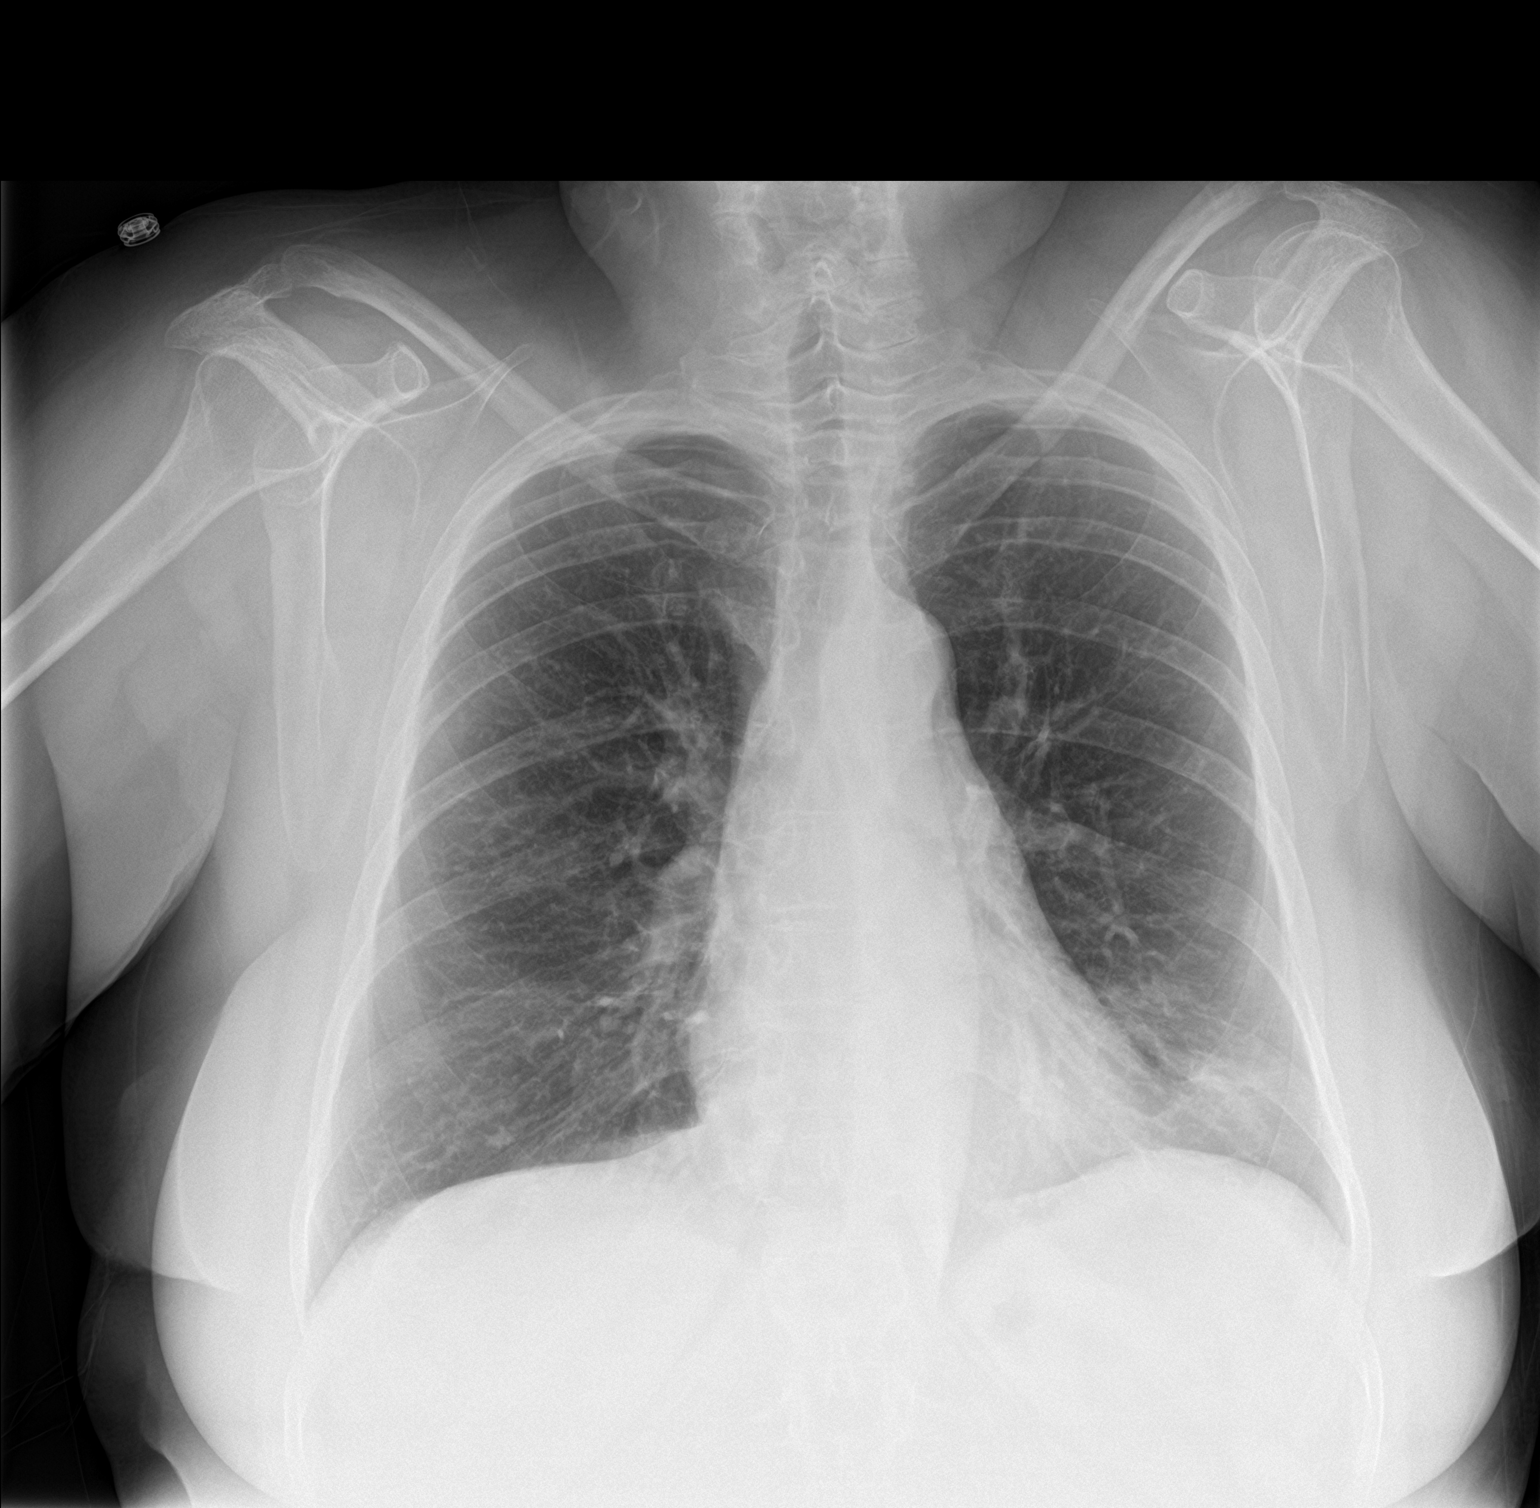

[chest lat]
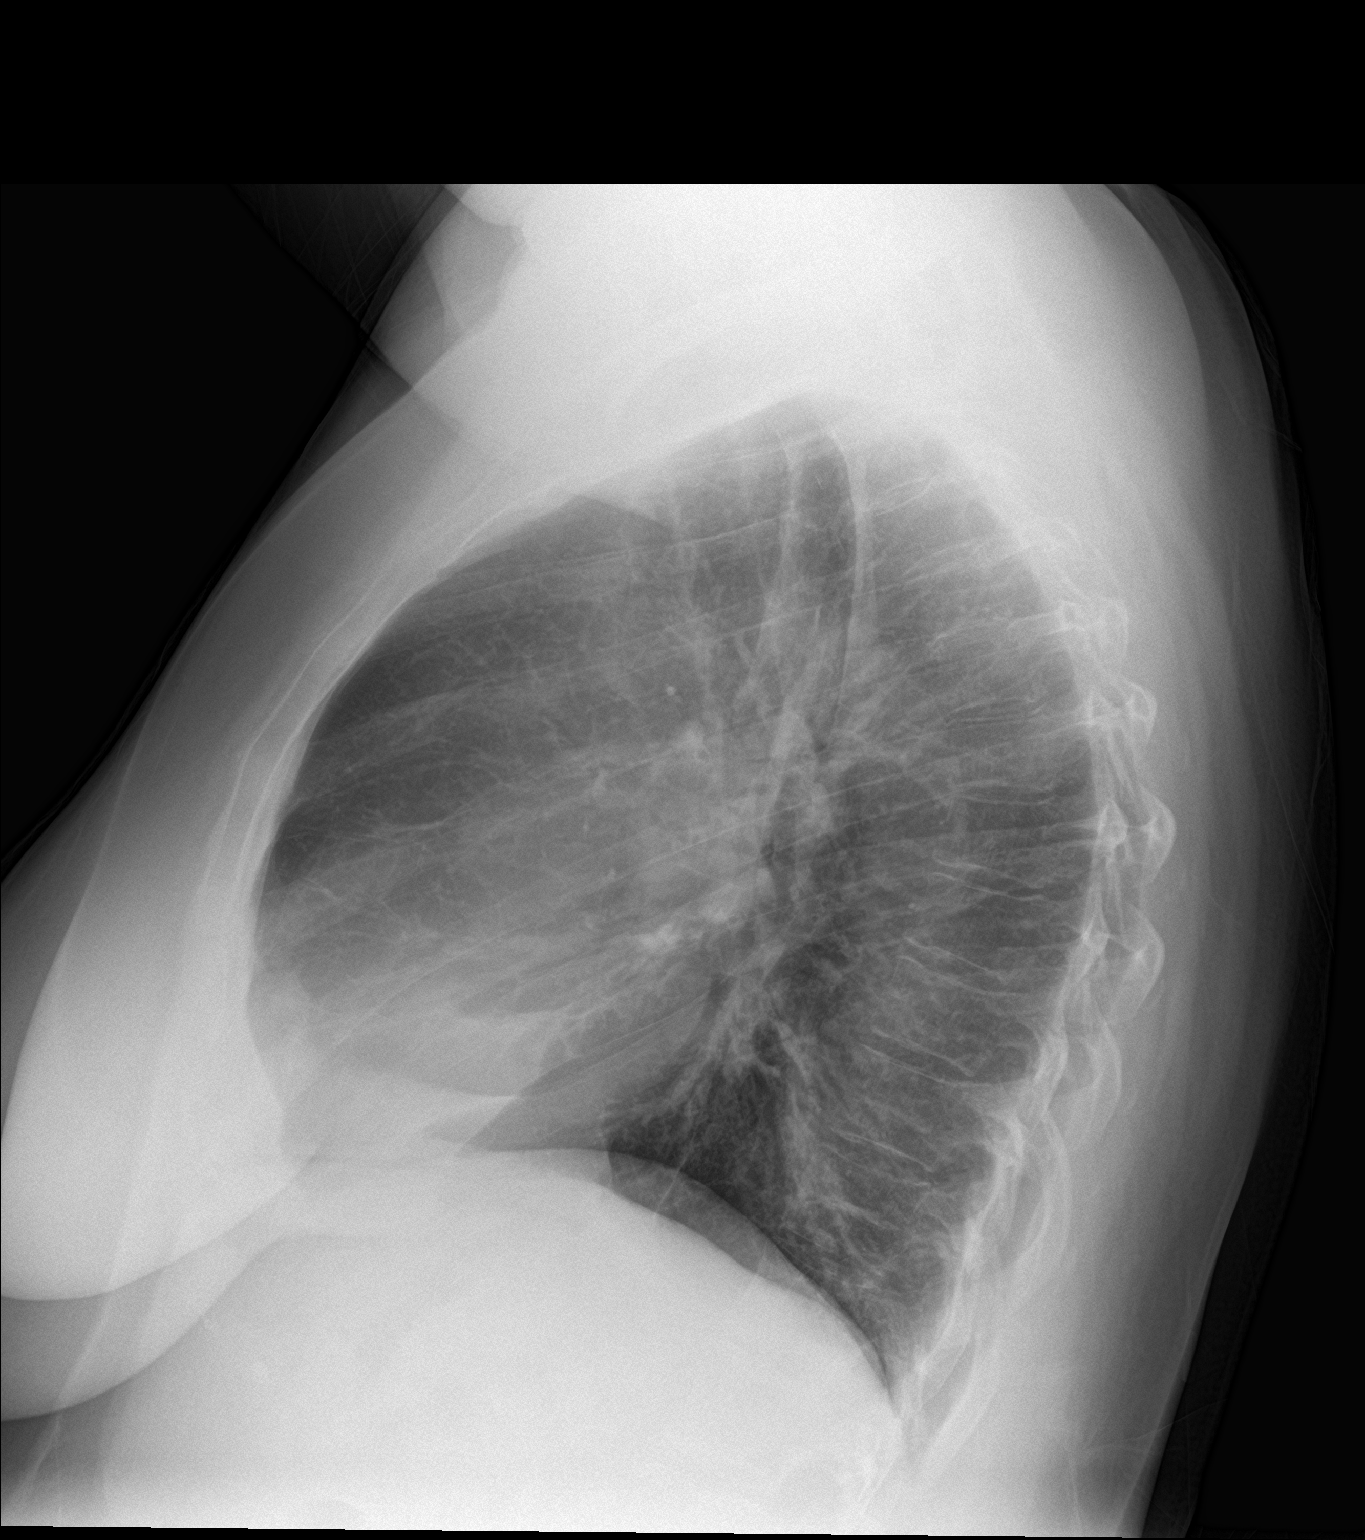

[2 of 2 positions shown; findings below may reference images not displayed]

FINDINGS: Hazy lingular opacity that correlates with fat pad and mild lung
opacification. Normal heart size and mediastinal contours. Chronic
hyperinflation and mild bronchitic airway thickening.
IMPRESSION: Stable compared to 3 days ago.  No suspected acute disease.

## 2018-01-06 ENCOUNTER — Ambulatory Visit
Admission: RE | Admit: 2018-01-06 | Discharge: 2018-01-06 | Disposition: A | Discharge status: Home / Self Care | Payer: PPO | Source: Ambulatory Visit | Attending: Gastroenterology | Admitting: Gastroenterology

## 2018-01-06 ENCOUNTER — Encounter: Payer: Self-pay | Admitting: Anesthesiology

## 2018-01-06 ENCOUNTER — Ambulatory Visit: Payer: PPO | Admitting: Anesthesiology

## 2018-01-06 ENCOUNTER — Encounter
Admission: RE | Disposition: A | Discharge status: Home / Self Care | Payer: Self-pay | Source: Ambulatory Visit | Attending: Gastroenterology

## 2018-01-06 DIAGNOSIS — K5790 Diverticulosis of intestine, part unspecified, without perforation or abscess without bleeding: Secondary | ICD-10-CM | POA: Diagnosis not present

## 2018-01-06 DIAGNOSIS — K2210 Ulcer of esophagus without bleeding: Secondary | ICD-10-CM | POA: Diagnosis not present

## 2018-01-06 DIAGNOSIS — Z800 Family history of malignant neoplasm of digestive organs: Secondary | ICD-10-CM | POA: Insufficient documentation

## 2018-01-06 DIAGNOSIS — I110 Hypertensive heart disease with heart failure: Secondary | ICD-10-CM | POA: Insufficient documentation

## 2018-01-06 DIAGNOSIS — K5730 Diverticulosis of large intestine without perforation or abscess without bleeding: Secondary | ICD-10-CM | POA: Diagnosis not present

## 2018-01-06 DIAGNOSIS — Z86718 Personal history of other venous thrombosis and embolism: Secondary | ICD-10-CM | POA: Insufficient documentation

## 2018-01-06 DIAGNOSIS — Z880 Allergy status to penicillin: Secondary | ICD-10-CM | POA: Diagnosis not present

## 2018-01-06 DIAGNOSIS — K625 Hemorrhage of anus and rectum: Secondary | ICD-10-CM | POA: Diagnosis not present

## 2018-01-06 DIAGNOSIS — K2970 Gastritis, unspecified, without bleeding: Secondary | ICD-10-CM | POA: Insufficient documentation

## 2018-01-06 DIAGNOSIS — Z794 Long term (current) use of insulin: Secondary | ICD-10-CM | POA: Insufficient documentation

## 2018-01-06 DIAGNOSIS — K208 Other esophagitis: Secondary | ICD-10-CM | POA: Diagnosis not present

## 2018-01-06 DIAGNOSIS — D123 Benign neoplasm of transverse colon: Secondary | ICD-10-CM | POA: Diagnosis not present

## 2018-01-06 DIAGNOSIS — K3189 Other diseases of stomach and duodenum: Secondary | ICD-10-CM | POA: Diagnosis not present

## 2018-01-06 DIAGNOSIS — Z881 Allergy status to other antibiotic agents status: Secondary | ICD-10-CM | POA: Diagnosis not present

## 2018-01-06 DIAGNOSIS — K219 Gastro-esophageal reflux disease without esophagitis: Secondary | ICD-10-CM | POA: Diagnosis not present

## 2018-01-06 DIAGNOSIS — E119 Type 2 diabetes mellitus without complications: Secondary | ICD-10-CM | POA: Diagnosis not present

## 2018-01-06 DIAGNOSIS — J449 Chronic obstructive pulmonary disease, unspecified: Secondary | ICD-10-CM | POA: Insufficient documentation

## 2018-01-06 DIAGNOSIS — E785 Hyperlipidemia, unspecified: Secondary | ICD-10-CM | POA: Diagnosis not present

## 2018-01-06 DIAGNOSIS — Z7982 Long term (current) use of aspirin: Secondary | ICD-10-CM | POA: Diagnosis not present

## 2018-01-06 DIAGNOSIS — K449 Diaphragmatic hernia without obstruction or gangrene: Secondary | ICD-10-CM | POA: Diagnosis not present

## 2018-01-06 DIAGNOSIS — I509 Heart failure, unspecified: Secondary | ICD-10-CM | POA: Insufficient documentation

## 2018-01-06 DIAGNOSIS — K210 Gastro-esophageal reflux disease with esophagitis: Secondary | ICD-10-CM | POA: Diagnosis not present

## 2018-01-06 DIAGNOSIS — K635 Polyp of colon: Secondary | ICD-10-CM | POA: Diagnosis not present

## 2018-01-06 DIAGNOSIS — Z8371 Family history of colonic polyps: Secondary | ICD-10-CM | POA: Diagnosis not present

## 2018-01-06 DIAGNOSIS — K6289 Other specified diseases of anus and rectum: Secondary | ICD-10-CM | POA: Insufficient documentation

## 2018-01-06 DIAGNOSIS — D120 Benign neoplasm of cecum: Secondary | ICD-10-CM | POA: Insufficient documentation

## 2018-01-06 DIAGNOSIS — K621 Rectal polyp: Secondary | ICD-10-CM | POA: Diagnosis not present

## 2018-01-06 DIAGNOSIS — Z79899 Other long term (current) drug therapy: Secondary | ICD-10-CM | POA: Insufficient documentation

## 2018-01-06 DIAGNOSIS — K2950 Unspecified chronic gastritis without bleeding: Secondary | ICD-10-CM | POA: Diagnosis not present

## 2018-01-06 DIAGNOSIS — Z86010 Personal history of colonic polyps: Secondary | ICD-10-CM | POA: Diagnosis not present

## 2018-01-06 DIAGNOSIS — K319 Disease of stomach and duodenum, unspecified: Secondary | ICD-10-CM | POA: Diagnosis not present

## 2018-01-06 DIAGNOSIS — R1310 Dysphagia, unspecified: Secondary | ICD-10-CM | POA: Diagnosis not present

## 2018-01-06 DIAGNOSIS — K209 Esophagitis, unspecified: Secondary | ICD-10-CM | POA: Diagnosis not present

## 2018-01-06 DIAGNOSIS — K469 Unspecified abdominal hernia without obstruction or gangrene: Secondary | ICD-10-CM | POA: Diagnosis not present

## 2018-01-06 HISTORY — DX: Varicella without complication: B01.9

## 2018-01-06 HISTORY — DX: Diaphragmatic hernia without obstruction or gangrene: K44.9

## 2018-01-06 HISTORY — DX: Other seasonal allergic rhinitis: J30.2

## 2018-01-06 HISTORY — PX: COLONOSCOPY WITH PROPOFOL: SHX5780

## 2018-01-06 HISTORY — DX: Acute embolism and thrombosis of unspecified deep veins of unspecified lower extremity: I82.409

## 2018-01-06 HISTORY — PX: ESOPHAGOGASTRODUODENOSCOPY (EGD) WITH PROPOFOL: SHX5813

## 2018-01-06 HISTORY — DX: Hyperlipidemia, unspecified: E78.5

## 2018-01-06 LAB — GLUCOSE, CAPILLARY: GLUCOSE-CAPILLARY: 84 mg/dL (ref 70–99)

## 2018-01-06 SURGERY — COLONOSCOPY WITH PROPOFOL
Anesthesia: General

## 2018-01-06 MED ORDER — PROPOFOL 10 MG/ML IV BOLUS
INTRAVENOUS | Status: AC
  Filled 2018-01-06: qty 20

## 2018-01-06 MED ORDER — PROPOFOL 500 MG/50ML IV EMUL
INTRAVENOUS | Status: AC
  Filled 2018-01-06: qty 50

## 2018-01-06 MED ORDER — SODIUM CHLORIDE 0.9 % IJ SOLN
INTRAMUSCULAR | Status: DC | PRN
  Administered 2018-01-06: .00001 mL
  Administered 2018-01-06: 3 mL

## 2018-01-06 MED ORDER — SODIUM CHLORIDE 0.9 % IV SOLN
INTRAVENOUS | Status: DC
  Administered 2018-01-06: 11:00:00 via INTRAVENOUS

## 2018-01-06 MED ORDER — LIDOCAINE HCL (CARDIAC) PF 100 MG/5ML IV SOSY
PREFILLED_SYRINGE | INTRAVENOUS | Status: DC | PRN
  Administered 2018-01-06: 80 mg via INTRAVENOUS
  Administered 2018-01-06: 20 mg via INTRAVENOUS

## 2018-01-06 MED ORDER — LIDOCAINE HCL (PF) 2 % IJ SOLN
INTRAMUSCULAR | Status: AC
  Filled 2018-01-06: qty 10

## 2018-01-06 MED ORDER — PROPOFOL 500 MG/50ML IV EMUL
INTRAVENOUS | Status: DC | PRN
  Administered 2018-01-06: 100 ug/kg/min via INTRAVENOUS

## 2018-01-06 MED ORDER — PROPOFOL 10 MG/ML IV BOLUS
INTRAVENOUS | Status: DC | PRN
  Administered 2018-01-06: 50 mg via INTRAVENOUS
  Administered 2018-01-06: 100 mg via INTRAVENOUS
  Administered 2018-01-06: 50 mg via INTRAVENOUS
  Administered 2018-01-06 (×2): 100 mg via INTRAVENOUS

## 2018-01-06 MED ORDER — IPRATROPIUM-ALBUTEROL 0.5-2.5 (3) MG/3ML IN SOLN: 3.0000 mL | Freq: Once | RESPIRATORY_TRACT | Status: DC

## 2018-01-06 NOTE — Anesthesia Postprocedure Evaluation (Signed)
Anesthesia Post Note  Patient: Cassie Ross  Procedure(s) Performed: COLONOSCOPY WITH PROPOFOL (N/A ) ESOPHAGOGASTRODUODENOSCOPY (EGD) WITH PROPOFOL (N/A )  Patient location during evaluation: Endoscopy Anesthesia Type: General Level of consciousness: awake and alert Pain management: pain level controlled Vital Signs Assessment: post-procedure vital signs reviewed and stable Respiratory status: spontaneous breathing, nonlabored ventilation, respiratory function stable and patient connected to nasal cannula oxygen Cardiovascular status: blood pressure returned to baseline and stable Postop Assessment: no apparent nausea or vomiting Anesthetic complications: no     Last Vitals:  Vitals:   01/06/18 1250 01/06/18 1300  BP: 120/88 123/75  Pulse: 90 75  Resp: 16 16  Temp:    SpO2: 99% 98%    Last Pain:  Vitals:   01/06/18 1300  TempSrc:   PainSc: 0-No pain                 Precious Haws Piscitello

## 2018-01-06 NOTE — Op Note (Signed)
[facility] Gastroenterology Patient Name: Cassie Ross Procedure Date: 01/06/2018 10:22 AM MRN: [id number] Account #: [id number] Date of Birth: 03-04-1961 Admit Type: Outpatient Age: 57 Room: [facility] ENDO ROOM 2 Gender: Female Note Status: Finalized Procedure:            Upper GI endoscopy Indications:          Dysphagia, Odynophagia Providers:            Lollie Sails, MD Referring MD:         Lavera Guise, MD (Referring MD) Medicines:            Monitored Anesthesia Care Complications:        No immediate complications. Procedure:            Pre-Anesthesia Assessment:                       - ASA Grade Assessment: III - A patient with severe                        systemic disease.                       After obtaining informed consent, the endoscope was                        passed under direct vision. Throughout the procedure,                        the patient's blood pressure, pulse, and oxygen                        saturations were monitored continuously. The Endoscope                        was introduced through the mouth, and advanced to the                        third part of duodenum. The upper GI endoscopy was                        accomplished without difficulty. The patient tolerated                        the procedure well. Findings:      LA Grade D (one or more mucosal breaks involving at least 75% of       esophageal circumference) esophagitis with friable surfaces was found.       Biopsies were taken with a cold forceps for histology from the GE       junction and about 1 cm above the junction in an area of inflamation and       atypical mucosa. .      A medium-sized hiatal hernia was present. Retroflexed view of the upper       stomach shows a hiatal hernia.      Diffuse moderate inflammation characterized by congestion (edema) and       erythema was found in the entire examined stomach. Biopsies were taken       with a cold  forceps for histology.      Diffuse mucosal flattening was found in the entire examined duodenum.  Biopsies were taken with a cold forceps for histology. Impression:           - LA Grade D erosive esophagitis. Biopsied.                       - Medium-sized hiatal hernia.                       - Gastritis. Biopsied.                       - Flattened mucosa was found in the duodenum, not                        consistent with celiac disease. Biopsied. Recommendation:       - Use Protonix (pantoprazole) 40 mg PO BID for 6 weeks.                       - Return to GI clinic in 4 weeks.                       - Repeat upper endoscopy in 6 weeks to evaluate the                        response to therapy. Procedure Code(s):    --- Professional ---                       [phone], Esophagogastroduodenoscopy, flexible, transoral;                        with biopsy, single or multiple Diagnosis Code(s):    --- Professional ---                       K20.8, Other esophagitis                       K44.9, Diaphragmatic hernia without obstruction or                        gangrene                       K29.70, Gastritis, unspecified, without bleeding                       K31.89, Other diseases of stomach and duodenum                       R13.10, Dysphagia, unspecified CPT copyright 2018 [employer]. All rights reserved. The codes documented in this report are preliminary and upon coder review may  be revised to meet current compliance requirements. Lollie Sails, MD 01/06/2018 11:51:08 AM This report has been signed electronically. Number of Addenda: 0 Note Initiated On: 01/06/2018 10:22 AM      [facility]

## 2018-01-06 NOTE — Anesthesia Post-op Follow-up Note (Signed)
Anesthesia QCDR form completed.        

## 2018-01-06 NOTE — Op Note (Signed)
[facility] Gastroenterology Patient Name: Cassie Ross Procedure Date: 01/06/2018 10:19 AM MRN: [id number] Account #: [id number] Date of Birth: Jul 31, 1960 Admit Type: Outpatient Age: 57 Room: [facility] ENDO ROOM 2 Gender: Female Note Status: Finalized Procedure:            Colonoscopy Indications:          Family history of colon cancer in a first-degree                        relative, Personal history of colonic polyps Providers:            Lollie Sails, MD Referring MD:         Lavera Guise, MD (Referring MD) Medicines:            Monitored Anesthesia Care Complications:        No immediate complications. Procedure:            Pre-Anesthesia Assessment:                       - ASA Grade Assessment: III - A patient with severe                        systemic disease.                       After obtaining informed consent, the colonoscope was                        passed under direct vision. Throughout the procedure,                        the patient's blood pressure, pulse, and oxygen                        saturations were monitored continuously. The                        Colonoscope was introduced through the anus and                        advanced to the the cecum, identified by appendiceal                        orifice and ileocecal valve. The colonoscopy was                        performed with moderate difficulty due to a redundant                        colon and the patient's body habitus. Successful                        completion of the procedure was aided by changing the                        patient to a supine position and changing the patient                        to a prone position. The patient tolerated the  procedure well. The quality of the bowel preparation                        was good. Findings:      Multiple medium-mouthed diverticula were found in the sigmoid colon,       descending colon,  transverse colon, ascending colon and cecum.      A 38 mm polyp was found in the hepatic flexure. The polyp was       carpet-like and extended over the major fold of the hepatic flexure and       up 2 more proximal folds adjacent. Biopsies were taken with a cold       forceps for histology. Area was tattooed with an injection of 3 mL of       [place] ink.      Two sessile polyps were found in the cecum. The polyps were 1 to 3 mm in       size. These polyps were removed with a cold biopsy forceps. Resection       and retrieval were complete.      A localized area of mildly granular mucosa was found at the anus       adjacent to the anal verge, at about 4-5 O'clock ( anterior 12:00).       Biopsies were taken with a cold forceps for histology.      The digital rectal exam was normal. Impression:           - Diverticulosis in the sigmoid colon, in the                        descending colon, in the transverse colon, in the                        ascending colon and in the cecum.                       - One 38 mm polyp at the hepatic flexure. Biopsied.                        Tattooed.                       - Two 1 to 3 mm polyps in the cecum, removed with a                        cold biopsy forceps. Resected and retrieved.                       - Granular mucosa at the anus. Biopsied. Recommendation:       - Discharge patient to home.                       - Full liquid diet today, then advance as tolerated to                        soft diet for 1 day.                       - Telephone GI clinic for pathology results in 1 week.                       -  Return to GI clinic in 3 weeks. Procedure Code(s):    --- Professional ---                       [phone], Colonoscopy, flexible; with directed submucosal                        injection(s), any substance                       [id number], Colonoscopy, flexible; with biopsy, single or                        multiple Diagnosis Code(s):    ---  Professional ---                       D12.3, Benign neoplasm of transverse colon (hepatic                        flexure or splenic flexure)                       D12.0, Benign neoplasm of cecum                       K62.89, Other specified diseases of anus and rectum                       Z80.0, Family history of malignant neoplasm of                        digestive organs                       Z86.010, Personal history of colonic polyps                       K57.30, Diverticulosis of large intestine without                        perforation or abscess without bleeding CPT copyright 2018 [employer]. All rights reserved. The codes documented in this report are preliminary and upon coder review may  be revised to meet current compliance requirements. Lollie Sails, MD 01/06/2018 12:45:11 PM This report has been signed electronically. Number of Addenda: 0 Note Initiated On: 01/06/2018 10:19 AM Scope Withdrawal Time: 0 hours 23 minutes 11 seconds  Total Procedure Duration: 0 hours 41 minutes 58 seconds       [facility]

## 2018-01-06 NOTE — H&P (Signed)
Outpatient short stay form Pre-procedure 01/06/2018 11:02 AM Lollie Sails MD  Primary Physician: Dr. Clayborn Bigness  Reason for visit: EGD and colonoscopy  History of present illness: Patient is a 57 year old female presenting today for EGD and colonoscopy.  She has a personal history of colon polyps family history of colon cancer in primary relative and colon polyps in another primary relative.  She also has a history of problems with dysphagia.  She had a barium swallow on 11/12/2017 that showed a hiatal hernia possibly a paraesophageal component as well as a possible Schatzki ring.  Tablet did seem to hang however this was more so in the hernia portion of the stomach rather than at the GE junction any other area of fixed stricture.  It is interesting to note that she had a acute surgery for a strangulated volvulus with a hernia component in the 1990s.  H she has had about 6 dilations these do not seem to be alleviating her symptoms.  She tolerated her prep well.  Takes an 81 mg aspirin that she had held.  He denies a current use of any other products containing aspirin including the Coricidin D. she also takes a daily proton pump inhibitor.  Did have a breathing treatment earlier this morning feels her breathing is good.    Current Facility-Administered Medications:  .  0.9 %  sodium chloride infusion, , Intravenous, Continuous, Lollie Sails, MD .  ipratropium-albuterol (DUONEB) 0.5-2.5 (3) MG/3ML nebulizer solution 3 mL, 3 mL, Nebulization, Once, Piscitello, Precious Haws, MD  Medications Prior to Admission  Medication Sig Dispense Refill Last Dose  . acetaminophen (TYLENOL) 500 MG tablet Take 500 mg by mouth as needed (Take 2 capsules by mouth as needed).     Marland Kitchen ibuprofen (ADVIL,MOTRIN) 800 MG tablet Take 800 mg by mouth every 8 (eight) hours as needed.     . sucralfate (CARAFATE) 1 g tablet Take 1 g by mouth 4 (four) times daily.     Marland Kitchen albuterol (PROAIR HFA) 108 (90 Base) MCG/ACT inhaler  Inhale 2 puffs into the lungs every 4 (four) hours as needed for wheezing or shortness of breath. 3 Inhaler 3 Taking  . albuterol (PROVENTIL) (2.5 MG/3ML) 0.083% nebulizer solution USE 1 VIAL IN NEBULIZER EVERY 6 HOURS AS NEEDED 300 mL 5 01/06/2018 at 0800  . ALPRAZolam (XANAX) 0.5 MG tablet Take 1 tablet po bid prn anxiety 60 tablet 2 Taking  . ALPRAZolam (XANAX) 0.5 MG tablet TAKE 1/2 (ONE-HALF) TABLET BY MOUTH IN THE MORNING AND TAKE 1 TABLET IN THE EVENING. 60 tablet 1 Taking  . aspirin EC 81 MG tablet Take 81 mg by mouth daily.   01/04/2018  . atorvastatin (LIPITOR) 10 MG tablet Take 1 tablet (10 mg total) by mouth daily. 90 tablet 1   . azithromycin (ZITHROMAX) 250 MG tablet TAKE 1 TABLET BY MOUTH ONCE DAILY 15 tablet 1 Taking  . budesonide-formoterol (SYMBICORT) 160-4.5 MCG/ACT inhaler Inhale 1 puff into the lungs 2 (two) times daily.   Taking  . Calcium Citrate-Vitamin D (CALCIUM + D PO) Take 2 tablets by mouth 2 (two) times daily.   Taking  . Canagliflozin-Metformin HCl (INVOKAMET) 150-500 MG TABS Take 1 tablet by mouth at bedtime.   Taking  . Chlorpheniramine-APAP (CORICIDIN) 2-325 MG TABS Take 2 tablets by mouth daily as needed.   Taking  . diltiazem (CARDIZEM CD) 180 MG 24 hr capsule Take 1 capsule (180 mg total) by mouth 2 (two) times daily. 180 capsule 1 01/06/2018  at 0400  . furosemide (LASIX) 40 MG tablet Take 80 mg by mouth daily.    Taking  . glucose blood (TRUE METRIX BLOOD GLUCOSE TEST) test strip    Taking  . insulin lispro (HUMALOG) 100 UNIT/ML injection Inject 2-10 Units into the skin 3 (three) times daily before meals. Based on sliding scale. For BS of 150-200, take 2 units, 201-250, take 4 units, 251-300, take 6 units, 301-350 take 8 units, 351-400 take 10 units. Over 400 contact physician.   Taking  . loratadine (CLARITIN) 10 MG tablet Take 10 mg by mouth daily.   Taking  . montelukast (SINGULAIR) 10 MG tablet Take 1 tablet (10 mg total) by mouth at bedtime. 90 tablet 1   .  Multiple Vitamins-Minerals (ONE-A-DAY MENOPAUSE FORMULA PO) Take 1 tablet by mouth daily.   Taking  . Pantoprazole Sodium (PROTONIX PO) Take by mouth.   Taking  . potassium chloride (K-DUR,KLOR-CON) 10 MEQ tablet Take 1 tablet (10 mEq total) by mouth 2 (two) times daily. 180 tablet 5 Taking  . predniSONE (DELTASONE) 10 MG tablet Take 2 tab po 3 times a day  As needed total upto 60 mg (Patient not taking: Reported on 12/01/2017) 100 tablet 1 Not Taking  . theophylline (THEODUR) 300 MG 12 hr tablet Take 1 tablet (300 mg total) by mouth daily. 90 tablet 1 01/06/2018 at 0400  . triamcinolone (KENALOG) 0.025 % cream Apply 1 application topically 2 (two) times daily. 80 g 2 Taking  . TRUEPLUS LANCETS 28G MISC    Taking     Allergies  Allergen Reactions  . Penicillins     Has patient had a PCN reaction causing immediate rash, facial/tongue/throat swelling, SOB or lightheadedness with hypotension: no Has patient had a PCN reaction causing severe rash involving mucus membranes or skin necrosis: no Has patient had a PCN reaction that required hospitalization no Has patient had a PCN reaction occurring within the last 10 years: no If all of the above answers are "NO", then may proceed with Cephalosporin use.   . Shellfish Allergy Swelling  . Sunflower Oil Swelling    seeds  . Levaquin [Levofloxacin] Rash    Allergy to pill form only. Patient has had IV with no problem.     Past Medical History:  Diagnosis Date  . Asthma   . CHF (congestive heart failure) ([place])    1991- unknown after asthma attack  . Chicken pox   . COPD (chronic obstructive pulmonary disease) ([place])   . Diabetes ([place])   . DVT (deep venous thrombosis) ([place])   . GERD (gastroesophageal reflux disease)   . Hernia, hiatal   . HLD (hyperlipidemia)   . Hypertension   . Hypertension   . Seasonal allergies     Review of systems:      Physical Exam    Heart and lungs: Regular rate and rhythm without rub or gallop, lungs are  bilaterally clear.    HEENT: Normocephalic atraumatic eyes are anicteric    Other:    Pertinant exam for procedure: Soft nontender nondistended bowel sounds positive normoactive    Planned proceedures: EGD, colonoscopy and indicated procedures. I have discussed the risks benefits and complications of procedures to include not limited to bleeding, infection, perforation and the risk of sedation and the patient wishes to proceed.    Lollie Sails, MD Gastroenterology 01/06/2018  11:02 AM

## 2018-01-06 NOTE — Transfer of Care (Signed)
Immediate Anesthesia Transfer of Care Note  Patient: Cassie Ross  Procedure(s) Performed: COLONOSCOPY WITH PROPOFOL (N/A ) ESOPHAGOGASTRODUODENOSCOPY (EGD) WITH PROPOFOL (N/A )  Patient Location: PACU and Endoscopy Unit  Anesthesia Type:General  Level of Consciousness: drowsy and patient cooperative  Airway & Oxygen Therapy: Patient Spontanous Breathing  Post-op Assessment: Report given to RN, Post -op Vital signs reviewed and stable and Patient moving all extremities  Post vital signs: Reviewed and stable  Last Vitals:  Vitals Value Taken Time  BP    Temp    Pulse    Resp    SpO2      Last Pain:  Vitals:   01/06/18 1022  TempSrc: Tympanic         Complications: No apparent anesthesia complications

## 2018-01-06 NOTE — Anesthesia Preprocedure Evaluation (Signed)
Anesthesia Evaluation  Patient identified by MRN, date of birth, ID band Patient awake    Reviewed: Allergy & Precautions, H&P , NPO status , Patient's Chart, lab work & pertinent test results  History of Anesthesia Complications Negative for: history of anesthetic complications  Airway Mallampati: III  TM Distance: <3 FB Neck ROM: full    Dental  (+) Chipped, Poor Dentition, Missing   Pulmonary neg shortness of breath, asthma , pneumonia, COPD,           Cardiovascular Exercise Tolerance: Good hypertension, (-) angina+CHF  (-) Past MI and (-) DOE      Neuro/Psych  Neuromuscular disease negative neurological ROS  negative psych ROS   GI/Hepatic Neg liver ROS, hiatal hernia, GERD  Medicated and Controlled,  Endo/Other  diabetes, Type 2, Insulin Dependent  Renal/GU negative Renal ROS  negative genitourinary   Musculoskeletal  (+) Arthritis ,   Abdominal   Peds  Hematology negative hematology ROS (+)   Anesthesia Other Findings Past Medical History: No date: Asthma No date: CHF (congestive heart failure) ([facility])     Comment:  1991- unknown after asthma attack No date: Chicken pox No date: COPD (chronic obstructive pulmonary disease) ([facility]) No date: Diabetes ([facility]) No date: DVT (deep venous thrombosis) ([facility]) No date: GERD (gastroesophageal reflux disease) No date: Hernia, hiatal No date: HLD (hyperlipidemia) No date: Hypertension No date: Hypertension No date: Seasonal allergies  Past Surgical History: No date: cataract surgery No date: CESAREAN SECTION No date: CHOLECYSTECTOMY No date: ESOPHAGEAL DILATION No date: HERNIA REPAIR No date: hiatial hernia repair No date: NASAL SINUS SURGERY  BMI    Body Mass Index:  34.56 kg/m      Reproductive/Obstetrics negative OB ROS                             Anesthesia Physical Anesthesia Plan  ASA: III  Anesthesia Plan: General    Post-op Pain Management:    Induction: Intravenous  PONV Risk Score and Plan: Propofol infusion and TIVA  Airway Management Planned: Natural Airway and Nasal Cannula  Additional Equipment:   Intra-op Plan:   Post-operative Plan:   Informed Consent: I have reviewed the patients History and Physical, chart, labs and discussed the procedure including the risks, benefits and alternatives for the proposed anesthesia with the patient or authorized representative who has indicated his/her understanding and acceptance.   Dental Advisory Given  Plan Discussed with: Anesthesiologist, CRNA and Surgeon  Anesthesia Plan Comments: (Patient consented for risks of anesthesia including but not limited to:  - adverse reactions to medications - risk of intubation if required - damage to teeth, lips or other oral mucosa - sore throat or hoarseness - Damage to heart, brain, lungs or loss of life  Patient voiced understanding.)        Anesthesia Quick Evaluation

## 2018-01-07 ENCOUNTER — Encounter: Payer: Self-pay | Admitting: Gastroenterology

## 2018-01-10 LAB — SURGICAL PATHOLOGY

## 2018-01-12 ENCOUNTER — Other Ambulatory Visit: Payer: Self-pay | Admitting: Nurse Practitioner

## 2018-01-12 NOTE — Telephone Encounter (Signed)
As per dr Humphrey Rolls

## 2018-01-12 NOTE — Telephone Encounter (Signed)
LAST 9/19 AND NEXT 01/22/18 IF WE CAN CALL IN 10 DAYS

## 2018-01-16 ENCOUNTER — Encounter: Payer: Self-pay | Admitting: Adult Health

## 2018-01-16 ENCOUNTER — Ambulatory Visit (INDEPENDENT_AMBULATORY_CARE_PROVIDER_SITE_OTHER): Payer: PPO | Admitting: Adult Health

## 2018-01-16 VITALS — BP 118/80 | HR 87 | Resp 16 | Ht 63.0 in | Wt 192.0 lb

## 2018-01-16 DIAGNOSIS — K219 Gastro-esophageal reflux disease without esophagitis: Secondary | ICD-10-CM | POA: Diagnosis not present

## 2018-01-16 DIAGNOSIS — J45909 Unspecified asthma, uncomplicated: Secondary | ICD-10-CM

## 2018-01-16 DIAGNOSIS — J301 Allergic rhinitis due to pollen: Secondary | ICD-10-CM

## 2018-01-16 DIAGNOSIS — R0602 Shortness of breath: Secondary | ICD-10-CM

## 2018-01-16 NOTE — Progress Notes (Signed)
[facility] [place], [place] [address]  Pulmonary Sleep Medicine   Office Visit Note  Patient Name: Cassie Ross DOB: 18-Dec-1960 MRN [id number]  Date of Service: 01/16/2018  Complaints/HPI: Patient is here for follow-up on severe obstructive asthma.  She is doing well at this time, denies any exacerbations or recent hospital admissions.  She does not have a cough or congestion at this time.  Denies chest pain, overt shortness of breath, fever or chills.  She does report some mild wheezing which is normal for her.  She has not needed her inhalers to be increased as of recent and she is very happy with this.  Her allergy symptoms seem to be better controlled at this visit, the weather change seems to be helping.  ROS  General: (-) fever, (-) chills, (-) night sweats, (-) weakness Skin: (-) rashes, (-) itching,. Eyes: (-) visual changes, (-) redness, (-) itching. Nose and Sinuses: (-) nasal stuffiness or itchiness, (-) postnasal drip, (-) nosebleeds, (-) sinus trouble. Mouth and Throat: (-) sore throat, (-) hoarseness. Neck: (-) swollen glands, (-) enlarged thyroid, (-) neck pain. Respiratory: + cough, (-) bloody sputum, + shortness of breath, +wheezing. Cardiovascular: - ankle swelling, (-) chest pain. Lymphatic: (-) lymph node enlargement. Neurologic: (-) numbness, (-) tingling. Psychiatric: (-) anxiety, (-) depression   Current Medication: Outpatient Encounter Medications as of 01/16/2018  Medication Sig Note  . acetaminophen (TYLENOL) 500 MG tablet Take 500 mg by mouth as needed (Take 2 capsules by mouth as needed).   Marland Kitchen albuterol (PROAIR HFA) 108 (90 Base) MCG/ACT inhaler Inhale 2 puffs into the lungs every 4 (four) hours as needed for wheezing or shortness of breath.   Marland Kitchen albuterol (PROVENTIL) (2.5 MG/3ML) 0.083% nebulizer solution USE 1 VIAL IN NEBULIZER EVERY 6 HOURS AS NEEDED   . ALPRAZolam (XANAX) 0.5 MG tablet TAKE 1/2 (ONE-HALF) TABLET BY MOUTH  IN THE MORNING AND TAKE 1 TABLET IN THE EVENING.   . ALPRAZolam (XANAX) 0.5 MG tablet TAKE 1 TABLET BY MOUTH TWICE DAILY AS NEEDED FOR ANXIETY   . aspirin EC 81 MG tablet Take 81 mg by mouth daily.   Marland Kitchen atorvastatin (LIPITOR) 10 MG tablet Take 1 tablet (10 mg total) by mouth daily.   . budesonide-formoterol (SYMBICORT) 160-4.5 MCG/ACT inhaler Inhale 1 puff into the lungs 2 (two) times daily.   . Calcium Citrate-Vitamin D (CALCIUM + D PO) Take 2 tablets by mouth 2 (two) times daily.   . Canagliflozin-Metformin HCl (INVOKAMET) 150-500 MG TABS Take 1 tablet by mouth at bedtime.   . Chlorpheniramine-APAP (CORICIDIN) 2-325 MG TABS Take 2 tablets by mouth daily as needed.   . diltiazem (CARDIZEM CD) 180 MG 24 hr capsule Take 1 capsule (180 mg total) by mouth 2 (two) times daily.   . furosemide (LASIX) 40 MG tablet Take 80 mg by mouth daily.  03/19/2017: Pt only takes a second dose if needed.   Marland Kitchen glucose blood (TRUE METRIX BLOOD GLUCOSE TEST) test strip    . ibuprofen (ADVIL,MOTRIN) 800 MG tablet Take 800 mg by mouth every 8 (eight) hours as needed.   . insulin lispro (HUMALOG) 100 UNIT/ML injection Inject 2-10 Units into the skin 3 (three) times daily before meals. Based on sliding scale. For BS of 150-200, take 2 units, 201-250, take 4 units, 251-300, take 6 units, 301-350 take 8 units, 351-400 take 10 units. Over 400 contact physician.   . loratadine (CLARITIN) 10 MG tablet Take 10 mg by mouth daily.   Marland Kitchen  montelukast (SINGULAIR) 10 MG tablet Take 1 tablet (10 mg total) by mouth at bedtime.   . Multiple Vitamins-Minerals (ONE-A-DAY MENOPAUSE FORMULA PO) Take 1 tablet by mouth daily.   . Pantoprazole Sodium (PROTONIX PO) Take 40 mg by mouth.    . potassium chloride (K-DUR,KLOR-CON) 10 MEQ tablet Take 1 tablet (10 mEq total) by mouth 2 (two) times daily.   . sucralfate (CARAFATE) 1 g tablet Take 1 g by mouth 4 (four) times daily.   . theophylline (THEODUR) 300 MG 12 hr tablet Take 1 tablet (300 mg total) by  mouth daily.   Marland Kitchen triamcinolone (KENALOG) 0.025 % cream Apply 1 application topically 2 (two) times daily.   . TRUEPLUS LANCETS 28G MISC    . [DISCONTINUED] ALPRAZolam (XANAX) 0.5 MG tablet Take 1 tablet po bid prn anxiety (Patient not taking: Reported on 01/16/2018)   . [DISCONTINUED] azithromycin (ZITHROMAX) 250 MG tablet TAKE 1 TABLET BY MOUTH ONCE DAILY (Patient not taking: Reported on 01/16/2018)   . [DISCONTINUED] predniSONE (DELTASONE) 10 MG tablet Take 2 tab po 3 times a day  As needed total upto 60 mg (Patient not taking: Reported on 01/16/2018)    No facility-administered encounter medications on file as of 01/16/2018.     Surgical History: Past Surgical History:  Procedure Laterality Date  . cataract surgery    . CESAREAN SECTION    . CHOLECYSTECTOMY    . COLONOSCOPY WITH PROPOFOL N/A 01/06/2018   Procedure: COLONOSCOPY WITH PROPOFOL;  Surgeon: Lollie Sails, MD;  Location: [facility] ENDOSCOPY;  Service: Endoscopy;  Laterality: N/A;  . ESOPHAGEAL DILATION    . ESOPHAGOGASTRODUODENOSCOPY (EGD) WITH PROPOFOL N/A 01/06/2018   Procedure: ESOPHAGOGASTRODUODENOSCOPY (EGD) WITH PROPOFOL;  Surgeon: Lollie Sails, MD;  Location: [facility] ENDOSCOPY;  Service: Endoscopy;  Laterality: N/A;  . HERNIA REPAIR    . hiatial hernia repair    . NASAL SINUS SURGERY      Medical History: Past Medical History:  Diagnosis Date  . Asthma   . CHF (congestive heart failure) ([place])    1991- unknown after asthma attack  . Chicken pox   . COPD (chronic obstructive pulmonary disease) ([place])   . Diabetes ([place])   . DVT (deep venous thrombosis) ([place])   . GERD (gastroesophageal reflux disease)   . Hernia, hiatal   . HLD (hyperlipidemia)   . Hypertension   . Hypertension   . Seasonal allergies     Family History: Family History  Problem Relation Age of Onset  . Hypertension Mother     Social History: Social History   Socioeconomic History  . Marital status: Married    Spouse name: Not on  file  . Number of children: Not on file  . Years of education: Not on file  . Highest education level: Not on file  Occupational History  . Not on file  Social Needs  . Financial resource strain: Not on file  . Food insecurity:    Worry: Not on file    Inability: Not on file  . Transportation needs:    Medical: Not on file    Non-medical: Not on file  Tobacco Use  . Smoking status: Never Smoker  . Smokeless tobacco: Never Used  Substance and Sexual Activity  . Alcohol use: Yes    Comment: rarely  . Drug use: No  . Sexual activity: Not on file  Lifestyle  . Physical activity:    Days per week: Not on file    Minutes per session: Not  on file  . Stress: Not on file  Relationships  . Social connections:    Talks on phone: Not on file    Gets together: Not on file    Attends religious service: Not on file    Active member of club or organization: Not on file    Attends meetings of clubs or organizations: Not on file    Relationship status: Not on file  . Intimate partner violence:    Fear of current or ex partner: Not on file    Emotionally abused: Not on file    Physically abused: Not on file    Forced sexual activity: Not on file  Other Topics Concern  . Not on file  Social History Narrative   Lives at home with family. Independent    Vital Signs: Blood pressure 118/80, pulse 87, resp. rate 16, height 5\' 3"  (1.6 m), weight 192 lb (87.1 kg), SpO2 95 %.  Examination: General Appearance: The patient is well-developed, well-nourished, and in no distress. Skin: Gross inspection of skin unremarkable. Head: normocephalic, no gross deformities. Eyes: no gross deformities noted. ENT: ears appear grossly normal no exudates. Neck: Supple. No thyromegaly. No LAD. Respiratory: Clear to auscultation. Cardiovascular: Normal S1 and S2 without murmur or rub. Extremities: No cyanosis. pulses are equal. Neurologic: Alert and oriented. No involuntary movements.  LABS: Recent  Results (from the past 2160 hour(s))  CBC with Differential/Platelet     Status: Abnormal   Collection Time: 12/04/17  9:29 AM  Result Value Ref Range   WBC 10.3 3.4 - 10.8 x10E3/uL   RBC 4.63 3.77 - 5.28 x10E6/uL   Hemoglobin 14.1 11.1 - 15.9 g/dL   Hematocrit 41.5 34.0 - 46.6 %   MCV 90 79 - 97 fL   MCH 30.5 26.6 - 33.0 pg   MCHC 34.0 31.5 - 35.7 g/dL   RDW 13.5 12.3 - 15.4 %   Platelets 347 150 - 450 x10E3/uL   Neutrophils 57 Not Estab. %   Lymphs 27 Not Estab. %   Monocytes 9 Not Estab. %   Eos 3 Not Estab. %   Basos 1 Not Estab. %   Neutrophils Absolute 6.0 1.4 - 7.0 x10E3/uL   Lymphocytes Absolute 2.8 0.7 - 3.1 x10E3/uL   Monocytes Absolute 0.9 0.1 - 0.9 x10E3/uL   EOS (ABSOLUTE) 0.3 0.0 - 0.4 x10E3/uL   Basophils Absolute 0.1 0.0 - 0.2 x10E3/uL   Immature Granulocytes 3 Not Estab. %   Immature Grans (Abs) 0.3 (H) 0.0 - 0.1 x10E3/uL    Comment: (An elevated percentage of Immature Granulocytes has not been found to be clinically significant as a sole clinical predictor of disease. Does NOT include bands or blast cells.  Pregnancy associated physiological leukocytosis may also show increased immature granulocytes without clinical significance.)   Lipid Panel With LDL/HDL Ratio     Status: None   Collection Time: 12/04/17  9:29 AM  Result Value Ref Range   Cholesterol, Total 163 100 - 199 mg/dL   Triglycerides 95 0 - 149 mg/dL   HDL 71 >39 mg/dL   VLDL Cholesterol Cal 19 5 - 40 mg/dL   LDL Calculated 73 0 - 99 mg/dL   LDl/HDL Ratio 1.0 0.0 - 3.2 ratio    Comment:                                     LDL/HDL Ratio  Men  Women                               1/2 Avg.Risk  1.0    1.5                                   Avg.Risk  3.6    3.2                                2X Avg.Risk  6.2    5.0                                3X Avg.Risk  8.0    6.1   TSH     Status: None   Collection Time: 12/04/17  9:29 AM  Result Value Ref Range    TSH 1.890 0.450 - 4.500 uIU/mL  T4, free     Status: None   Collection Time: 12/04/17  9:29 AM  Result Value Ref Range   Free T4 1.28 0.82 - 1.77 ng/dL  Comprehensive metabolic panel     Status: Abnormal   Collection Time: 12/04/17  9:29 AM  Result Value Ref Range   Glucose 71 65 - 99 mg/dL   BUN 18 6 - 24 mg/dL   Creatinine, Ser 0.74 0.57 - 1.00 mg/dL   GFR calc non Af Amer 90 >59 mL/min/1.73   GFR calc Af Amer 103 >59 mL/min/1.73   BUN/Creatinine Ratio 24 (H) 9 - 23   Sodium 147 (H) 134 - 144 mmol/L   Potassium 4.2 3.5 - 5.2 mmol/L   Chloride 102 96 - 106 mmol/L   CO2 27 20 - 29 mmol/L   Calcium 9.5 8.7 - 10.2 mg/dL   Total Protein 6.5 6.0 - 8.5 g/dL   Albumin 4.6 3.5 - 5.5 g/dL   Globulin, Total 1.9 1.5 - 4.5 g/dL   Albumin/Globulin Ratio 2.4 (H) 1.2 - 2.2   Bilirubin Total 0.5 0.0 - 1.2 mg/dL   Alkaline Phosphatase 100 39 - 117 IU/L   AST 13 0 - 40 IU/L   ALT 16 0 - 32 IU/L  Microalbumin / creatinine urine ratio     Status: None   Collection Time: 12/04/17  9:29 AM  Result Value Ref Range   Creatinine, Urine 103.3 Not Estab. mg/dL   Microalbumin, Urine 12.1 Not Estab. ug/mL   Microalb/Creat Ratio 11.7 0.0 - 30.0 mg/g creat    Comment:                      Normal:                0.0 -  30.0                      Albuminuria:          31.0 - 300.0                      Clinical albuminuria:       >300.0   VITAMIN D 25 Hydroxy (Vit-D Deficiency, Fractures)     Status: None   Collection Time: 12/04/17  9:29 AM  Result Value Ref Range   Vit D,  25-Hydroxy 40.6 30.0 - 100.0 ng/mL    Comment: Vitamin D deficiency has been defined by the [place] practice guideline as a level of serum 25-OH vitamin D less than 20 ng/mL (1,2). The Endocrine Society went on to further define vitamin D insufficiency as a level between 21 and 29 ng/mL (2). 1. IOM (Institute of Medicine). 2010. Dietary reference    intakes for calcium and D. [place]:  The    [employer]. 2. Holick MF, Binkley [place], Bischoff-Ferrari HA, et al.    Evaluation, treatment, and prevention of vitamin D    deficiency: an Endocrine Society clinical practice    guideline. JCEM. 2011 Jul; 96(7):1911-30.   T3     Status: None   Collection Time: 12/04/17  9:29 AM  Result Value Ref Range   T3, Total 102 71 - 180 ng/dL  Glucose, capillary     Status: None   Collection Time: 01/06/18 10:21 AM  Result Value Ref Range   Glucose-Capillary 84 70 - 99 mg/dL  Surgical pathology     Status: None   Collection Time: 01/06/18 11:29 AM  Result Value Ref Range   SURGICAL PATHOLOGY      Surgical Pathology CASE: [id number] PATIENT: [facility] Surgical Pathology Report     SPECIMEN SUBMITTED: A. Duodenum, atypical mucosa; cbx B. Stomach, antrum; cbx C. Stomach, body; cbx D. GEJ; cbx E. Esophagus, distal; cbx F. Colon polyp, hepatic flexure; cbx G. Colon polyp x2, cecum; cbx H. Rectum, distal; cbx  CLINICAL HISTORY: None provided  PRE-OPERATIVE DIAGNOSIS: PH polyps dysphagia  POST-OPERATIVE DIAGNOSIS: Esophagitis grade D; gastrophy; hernia; diverticulosis     DIAGNOSIS: A.  DUODENUM, ATYPICAL MUCOSA; COLD BIOPSY: - DUODENAL MUCOSA WITH INTACT VILLI. - NEGATIVE FOR ACTIVE INFLAMMATION, INTRAEPITHELIAL LYMPHOCYTOSIS, AND INFECTIOUS AGENTS.  B. STOMACH, ANTRUM; COLD BIOPSY: - MODERATE REACTIVE GASTROPATHY. - NEGATIVE FOR ACTIVE INFLAMMATION, H. PYLORI, INTESTINAL METAPLASIA, DYSPLASIA, AND MALIGNANCY.  C.  STOMACH, BODY; COLD BIOPSY: - UNREMARKABLE OXYNTIC MUCOSA. - NEGATIVE FOR H. PYLORI, INTESTINAL METAPLASIA, DYSPLASIA, A ND MALIGNANCY.  D.  GASTROESOPHAGEAL JUNCTION; COLD BIOPSY: - SQUAMOCOLUMNAR MUCOSA WITH MILD CHRONIC ACTIVE INFLAMMATION AND REACTIVE EPITHELIAL CHANGES CONSISTENT WITH REFLUX ESOPHAGITIS. - COLUMNAR-LINED MUCOSA WITH REACTIVE FOVEOLAR HYPERPLASIA AND EDEMA. - NEGATIVE FOR GOBLET CELLS, DYSPLASIA, AND  MALIGNANCY.  E.  ESOPHAGUS, DISTAL; COLD BIOPSY: - EROSIVE ESOPHAGITIS. - NO VIRAL CYTOPATHIC EFFECTS OR PILL FRAGMENTS IDENTIFIED. - NEGATIVE FOR DYSPLASIA AND MALIGNANCY.  F.  COLON POLYP, HEPATIC FLEXURE; COLD BIOPSY: - TUBULAR ADENOMA 3 FRAGMENTS. - NEGATIVE FOR HIGH-GRADE DYSPLASIA AND MALIGNANCY.  G.  COLON POLYP X 2, CECUM; COLD BIOPSY: - TUBULAR ADENOMAS, 2 FRAGMENTS. - NEGATIVE FOR HIGH-GRADE DYSPLASIA AND MALIGNANCY.  H.  RECTUM, DISTAL; COLD BIOPSY: - NONSPECIFIC [place] HYPERPLASIA. - NEGATIVE FOR PROCTITIS, DYSPLASIA, AND MALIGNANCY.   GROSS DESCRIPTION: A. Labeled: Cbx atypical mucosa duodenum Received: In formalin Tissue fragment(s): 1 Size: 0. 5 cm Description: Tan fragment Entirely submitted in one cassette.  B. Labeled: Cbx gastric antrum Received: In formalin Tissue fragment(s): 1 Size: 0.6 cm Description: Tan fragment Entirely submitted in one cassette.  C. Labeled: Cbx gastric body Received: In formalin Tissue fragment(s): 2 Size: 0.2 and 0.4 cm Description: Tan fragments Entirely submitted in one cassette.  D. Labeled: Cbx GEJ Received: In formalin Tissue fragment(s): 2 Size: 0.5 and 0.6 cm Description: Tan fragments Entirely submitted in one cassette.  E. Labeled: Cbx distal esophagus Received: In formalin Tissue fragment(s): 2 Size: 0.1-0.2 cm Description: Tan-yellow  fragments Entirely submitted in one cassette.  F. Labeled: Cbx hepatic flexure polyp Received: In formalin Tissue fragment(s): 3 Size: 0.3-0.4 cm Description: Tan fragments Entirely submitted in one cassette.  G. Labeled: Cbx cecal polyp x2 Received: In formalin Tissue fragment(s): 2 Size: 0.3-0.4 cm Description : Tan fragments with a minimal amount of yellow fecal material Entirely submitted in one cassette.  H. Labeled: Cbx distal rectum Received: In formalin Tissue fragment(s): 2 Size: 0.1-0.15 cm Description: Tan-brown fragments Entirely submitted in  one cassette.   Final Diagnosis performed by Bryan Lemma, MD.   Electronically signed 01/10/2018 3:24:21PM The electronic signature indicates that the named Attending Pathologist has evaluated the specimen  Technical component performed at [facility], [address] [place], [address] [address] Lab: [phone] Dir: Rush Farmer, MD, MMM  Professional component performed at [facility], [facility], [place], [place],  [address] Lab: [phone] Dir: Dellia Nims. Reuel Derby, MD     Radiology: No results found.  No results found.  No results found.    Assessment and Plan: Patient Active Problem List   Diagnosis Date Noted  . Venous stasis ulcer of left lower leg with edema of left lower leg (HCC) 12/07/2017  . Needs flu shot 12/07/2017  . Uncontrolled type 2 diabetes mellitus with hyperglycemia ([place]) 06/08/2017  . Atopic dermatitis 06/08/2017  . Severe asthma without complication 95/63/8756  . Hypokalemia 06/08/2017  . Influenza A 04/22/2016  . Essential hypertension 04/21/2016  . Diabetes ([place]) 04/21/2016  . GERD (gastroesophageal reflux disease) 04/21/2016  . Pneumonia 02/26/2016  . Asthma exacerbation 02/21/2016  . Primary osteoarthritis of left knee 12/27/2014  . DDD (degenerative disc disease), lumbar 09/28/2013  . Lumbar radiculitis 09/28/2013   1. Severe asthma without complication, unspecified whether persistent Patient has persistent wheezing that is normal for her.  She states that her breathing actually feels pretty good today.  Encourage patient to continue using inhaler as directed.  2. Seasonal allergic rhinitis due to pollen Stable, continue current medication regimen  3. GERD without esophagitis Controlled, continue current therapy.  4. SOB (shortness of breath) - Spirometry with Graph   General Counseling: I have discussed the findings of the evaluation and examination with Cassie Ross.  I have also discussed any further diagnostic  evaluation thatmay be needed or ordered today. Cassie Ross verbalizes understanding of the findings of todays visit. We also reviewed her medications today and discussed drug interactions and side effects including but not limited excessive drowsiness and altered mental states. We also discussed that there is always a risk not just to her but also people around her. she has been encouraged to call the office with any questions or concerns that should arise related to todays visit.    Time spent: 25 This patient was seen by Orson Gear AGNP-C in Collaboration with Dr. Devona Konig as a part of collaborative care agreement.   I have personally obtained a history, examined the patient, evaluated laboratory and imaging results, formulated the assessment and plan and placed orders.    Allyne Gee, MD [facility] Pulmonary and Critical Care Sleep medicine

## 2018-01-16 NOTE — Patient Instructions (Signed)

## 2018-01-22 ENCOUNTER — Ambulatory Visit (INDEPENDENT_AMBULATORY_CARE_PROVIDER_SITE_OTHER): Payer: PPO | Admitting: Nurse Practitioner

## 2018-01-22 ENCOUNTER — Encounter: Payer: Self-pay | Admitting: Nurse Practitioner

## 2018-01-22 VITALS — BP 138/76 | HR 77 | Resp 16 | Ht 63.0 in | Wt 196.0 lb

## 2018-01-22 DIAGNOSIS — J4551 Severe persistent asthma with (acute) exacerbation: Secondary | ICD-10-CM | POA: Diagnosis not present

## 2018-01-22 DIAGNOSIS — E1165 Type 2 diabetes mellitus with hyperglycemia: Secondary | ICD-10-CM | POA: Diagnosis not present

## 2018-01-22 DIAGNOSIS — F411 Generalized anxiety disorder: Secondary | ICD-10-CM | POA: Diagnosis not present

## 2018-01-22 DIAGNOSIS — J069 Acute upper respiratory infection, unspecified: Secondary | ICD-10-CM | POA: Diagnosis not present

## 2018-01-22 DIAGNOSIS — I10 Essential (primary) hypertension: Secondary | ICD-10-CM | POA: Diagnosis not present

## 2018-01-22 DIAGNOSIS — J45909 Unspecified asthma, uncomplicated: Secondary | ICD-10-CM

## 2018-01-22 LAB — POCT GLYCOSYLATED HEMOGLOBIN (HGB A1C): Hemoglobin A1C: 6.3 % — AB (ref 4.0–5.6)

## 2018-01-22 MED ORDER — ALBUTEROL SULFATE HFA 108 (90 BASE) MCG/ACT IN AERS: 2.0000 | INHALATION_SPRAY | RESPIRATORY_TRACT | 3 refills | Status: DC | PRN

## 2018-01-22 MED ORDER — ALPRAZOLAM 0.5 MG PO TABS: ORAL_TABLET | ORAL | 3 refills | Status: DC

## 2018-01-22 MED ORDER — DOXYCYCLINE HYCLATE 100 MG PO TABS: 100.0000 mg | ORAL_TABLET | Freq: Two times a day (BID) | ORAL | 2 refills | Status: DC

## 2018-01-22 NOTE — Progress Notes (Signed)
[facility] [place], [place] [address]  Internal MEDICINE  Office Visit Note  Patient Name: Cassie Ross  [id number]  [id number]  Date of Service: 01/22/2018  Chief Complaint  Patient presents with  . Medical Management of Chronic Issues    3 month follow up   . Labs Only    review labs and testing results    The patient is here for routine follow up visit. Today, she is c/o increased wheezing and cough. Started about 3 days ago. Does have severe, intermittent asthma. Was around her granddaughter, who was recently diagnosed with croup. The patient is susceptible to respiratory infections.. Started her normal treatments with x-pack and prednisone. Has started watching her blood sugars, as prednisone makes her sugars high. Her HgbA1c is 6.3 today, indicating good, overall control of her sugars. She will alternate her treatment for respiratory infections  Between z-pack and doxycycline. She does need a new prescription for doxycycline to start once she finishes her z-pack.  She states that she needs a new prescription for nebulizer machine. She is having to take more breathing treatments. Motor on her current nebulizer sounds as though it's getting ready to give out. She also needs a new prescription for her alprazolam and her proair rescue inhaler.       Current Medication: Outpatient Encounter Medications as of 01/22/2018  Medication Sig Note  . acetaminophen (TYLENOL) 500 MG tablet Take 500 mg by mouth as needed (Take 2 capsules by mouth as needed).   Marland Kitchen albuterol (PROAIR HFA) 108 (90 Base) MCG/ACT inhaler Inhale 2 puffs into the lungs every 4 (four) hours as needed for wheezing or shortness of breath.   Marland Kitchen albuterol (PROVENTIL) (2.5 MG/3ML) 0.083% nebulizer solution USE 1 VIAL IN NEBULIZER EVERY 6 HOURS AS NEEDED   . ALPRAZolam (XANAX) 0.5 MG tablet TAKE 1/2 (ONE-HALF) TABLET BY MOUTH IN THE MORNING AND TAKE 1 TABLET IN THE EVENING.   Marland Kitchen aspirin EC 81 MG  tablet Take 81 mg by mouth daily.   Marland Kitchen atorvastatin (LIPITOR) 10 MG tablet Take 1 tablet (10 mg total) by mouth daily.   . budesonide-formoterol (SYMBICORT) 160-4.5 MCG/ACT inhaler Inhale 1 puff into the lungs 2 (two) times daily.   . Calcium Citrate-Vitamin D (CALCIUM + D PO) Take 2 tablets by mouth 2 (two) times daily.   . Canagliflozin-Metformin HCl (INVOKAMET) 150-500 MG TABS Take 1 tablet by mouth at bedtime.   . Chlorpheniramine-APAP (CORICIDIN) 2-325 MG TABS Take 2 tablets by mouth daily as needed.   . diltiazem (CARDIZEM CD) 180 MG 24 hr capsule Take 1 capsule (180 mg total) by mouth 2 (two) times daily.   . furosemide (LASIX) 40 MG tablet Take 80 mg by mouth daily.  03/19/2017: Pt only takes a second dose if needed.   Marland Kitchen glucose blood (TRUE METRIX BLOOD GLUCOSE TEST) test strip    . ibuprofen (ADVIL,MOTRIN) 800 MG tablet Take 800 mg by mouth every 8 (eight) hours as needed.   . insulin lispro (HUMALOG) 100 UNIT/ML injection Inject 2-10 Units into the skin 3 (three) times daily before meals. Based on sliding scale. For BS of 150-200, take 2 units, 201-250, take 4 units, 251-300, take 6 units, 301-350 take 8 units, 351-400 take 10 units. Over 400 contact physician.   . loratadine (CLARITIN) 10 MG tablet Take 10 mg by mouth daily.   . montelukast (SINGULAIR) 10 MG tablet Take 1 tablet (10 mg total) by mouth at bedtime.   Marland Kitchen  Multiple Vitamins-Minerals (ONE-A-DAY MENOPAUSE FORMULA PO) Take 1 tablet by mouth daily.   . Pantoprazole Sodium (PROTONIX PO) Take 40 mg by mouth.    . potassium chloride (K-DUR,KLOR-CON) 10 MEQ tablet Take 1 tablet (10 mEq total) by mouth 2 (two) times daily.   . sucralfate (CARAFATE) 1 g tablet Take 1 g by mouth 4 (four) times daily.   . theophylline (THEODUR) 300 MG 12 hr tablet Take 1 tablet (300 mg total) by mouth daily.   Marland Kitchen triamcinolone (KENALOG) 0.025 % cream Apply 1 application topically 2 (two) times daily.   . TRUEPLUS LANCETS 28G MISC    . [DISCONTINUED]  albuterol (PROAIR HFA) 108 (90 Base) MCG/ACT inhaler Inhale 2 puffs into the lungs every 4 (four) hours as needed for wheezing or shortness of breath.   . [DISCONTINUED] ALPRAZolam (XANAX) 0.5 MG tablet TAKE 1/2 (ONE-HALF) TABLET BY MOUTH IN THE MORNING AND TAKE 1 TABLET IN THE EVENING.   . [DISCONTINUED] ALPRAZolam (XANAX) 0.5 MG tablet TAKE 1 TABLET BY MOUTH TWICE DAILY AS NEEDED FOR ANXIETY   . [DISCONTINUED] ALPRAZolam (XANAX) 0.5 MG tablet TAKE 1/2 (ONE-HALF) TABLET BY MOUTH IN THE MORNING AND TAKE 1 TABLET IN THE EVENING.   Marland Kitchen doxycycline (VIBRA-TABS) 100 MG tablet Take 1 tablet (100 mg total) by mouth 2 (two) times daily.    No facility-administered encounter medications on file as of 01/22/2018.     Surgical History: Past Surgical History:  Procedure Laterality Date  . cataract surgery    . CESAREAN SECTION    . CHOLECYSTECTOMY    . COLONOSCOPY WITH PROPOFOL N/A 01/06/2018   Procedure: COLONOSCOPY WITH PROPOFOL;  Surgeon: Lollie Sails, MD;  Location: [facility] ENDOSCOPY;  Service: Endoscopy;  Laterality: N/A;  . ESOPHAGEAL DILATION    . ESOPHAGOGASTRODUODENOSCOPY (EGD) WITH PROPOFOL N/A 01/06/2018   Procedure: ESOPHAGOGASTRODUODENOSCOPY (EGD) WITH PROPOFOL;  Surgeon: Lollie Sails, MD;  Location: [facility] ENDOSCOPY;  Service: Endoscopy;  Laterality: N/A;  . HERNIA REPAIR    . hiatial hernia repair    . NASAL SINUS SURGERY      Medical History: Past Medical History:  Diagnosis Date  . Asthma   . CHF (congestive heart failure) ([place])    1991- unknown after asthma attack  . Chicken pox   . COPD (chronic obstructive pulmonary disease) ([place])   . Diabetes ([place])   . DVT (deep venous thrombosis) ([place])   . GERD (gastroesophageal reflux disease)   . Hernia, hiatal   . HLD (hyperlipidemia)   . Hypertension   . Hypertension   . Seasonal allergies     Family History: Family History  Problem Relation Age of Onset  . Hypertension Mother     Social History   Socioeconomic  History  . Marital status: Married    Spouse name: Not on file  . Number of children: Not on file  . Years of education: Not on file  . Highest education level: Not on file  Occupational History  . Not on file  Social Needs  . Financial resource strain: Not on file  . Food insecurity:    Worry: Not on file    Inability: Not on file  . Transportation needs:    Medical: Not on file    Non-medical: Not on file  Tobacco Use  . Smoking status: Never Smoker  . Smokeless tobacco: Never Used  Substance and Sexual Activity  . Alcohol use: Not Currently    Comment: pt has not had alcohol since october  .  Drug use: No  . Sexual activity: Not on file  Lifestyle  . Physical activity:    Days per week: Not on file    Minutes per session: Not on file  . Stress: Not on file  Relationships  . Social connections:    Talks on phone: Not on file    Gets together: Not on file    Attends religious service: Not on file    Active member of club or organization: Not on file    Attends meetings of clubs or organizations: Not on file    Relationship status: Not on file  . Intimate partner violence:    Fear of current or ex partner: Not on file    Emotionally abused: Not on file    Physically abused: Not on file    Forced sexual activity: Not on file  Other Topics Concern  . Not on file  Social History Narrative   Lives at home with family. Independent      Review of Systems  Constitutional: Negative for chills, diaphoresis, fatigue and unexpected weight change.  HENT: Positive for rhinorrhea. Negative for ear pain, postnasal drip, sinus pressure and sore throat.   Eyes: Negative.  Negative for photophobia, discharge, redness, itching and visual disturbance.  Respiratory: Positive for cough, chest tightness and wheezing. Negative for shortness of breath.   Cardiovascular: Negative for chest pain, palpitations and leg swelling.       Bruising/redniss of the skin of left lower extremity is  still present and unchanged. Ultrasound performed was negative for clot or other vascular abnormality.   Gastrointestinal: Negative for abdominal pain, constipation, diarrhea, nausea and vomiting.  Endocrine: Negative for cold intolerance, heat intolerance, polydipsia, polyphagia and polyuria.       Blood sugars doing well   Genitourinary: Negative.   Musculoskeletal: Positive for myalgias. Negative for arthralgias, back pain, gait problem and neck pain.  Skin: Negative for color change.  Allergic/Immunologic: Negative for environmental allergies and food allergies.  Neurological: Negative for dizziness and headaches.  Hematological: Negative for adenopathy. Does not bruise/bleed easily.  Psychiatric/Behavioral: Negative for agitation, behavioral problems (depression) and hallucinations. The patient is nervous/anxious.     Vital Signs: BP 138/76 (BP Location: Right Arm, Patient Position: Sitting, Cuff Size: Large)   Pulse 77   Resp 16   Ht 5\' 3"  (1.6 m)   Wt 196 lb (88.9 kg)   SpO2 97%   BMI 34.72 kg/m    Physical Exam  Constitutional: She is oriented to person, place, and time. She appears well-developed and well-nourished. No distress.  HENT:  Head: Normocephalic and atraumatic.  Nose: Nose normal.  Mouth/Throat: Oropharynx is clear and moist. No oropharyngeal exudate.  Eyes: Pupils are equal, round, and reactive to light. Conjunctivae and EOM are normal.  Neck: Normal range of motion. Neck supple. No JVD present. No tracheal deviation present. No thyromegaly present.  Cardiovascular: Normal rate, regular rhythm and normal heart sounds. Exam reveals no gallop and no friction rub.  No murmur heard. Pulmonary/Chest: Effort normal. No respiratory distress. She has wheezes. She has no rales. She exhibits no tenderness. Right breast exhibits no inverted nipple and no mass. Left breast exhibits no inverted nipple and no mass. No breast swelling.  Dry, harsch, non-productive cough  present.   Abdominal: Soft. Bowel sounds are normal. There is no tenderness.  Genitourinary: No breast swelling.  Musculoskeletal: Normal range of motion.  Lymphadenopathy:    She has no cervical adenopathy.  Neurological: She is alert  and oriented to person, place, and time. No cranial nerve deficit.  Skin: Skin is warm and dry. Capillary refill takes 2 to 3 seconds. She is not diaphoretic.  Area of bruising/redness present on posterior lower left leg, just above the heel. Skin in this area is slightly rough in texture ad non tender to palpated.   Psychiatric: She has a normal mood and affect. Her behavior is normal. Judgment and thought content normal.  Nursing note and vitals reviewed.  Assessment/Plan: 1. Severe persistent asthma with acute exacerbation Patient should continue z-pack and prednisone as previously prescribed. A new, written prescription for nebulizer machine given to the patient. Should use for treatments as needed and as indicated.  Continue to use inhalers and respiratory medications as prescribed.   2. Acute upper respiratory infection Add back doxycycline 100mg  twice daily to start after she completes treatment with z-pack.  - doxycycline (VIBRA-TABS) 100 MG tablet; Take 1 tablet (100 mg total) by mouth 2 (two) times daily.  Dispense: 42 tablet; Refill: 2  3. Uncontrolled type 2 diabetes mellitus with hyperglycemia (HCC) - POCT HgB A1C 6.3 today. Monitor blood sugars closely, especially while taking prednisone treatments.   4. Essential hypertension Stable. Continue bp medication as prescribed.   5. Generalized anxiety disorder May continue alprazolam 0.5mg  twice daily as needed for acute anxiety. New prescription sent to her pharmacy.  - ALPRAZolam (XANAX) 0.5 MG tablet; TAKE 1/2 (ONE-HALF) TABLET BY MOUTH IN THE MORNING AND TAKE 1 TABLET IN THE EVENING.  Dispense: 60 tablet; Refill: 3   General Counseling: Kalicia verbalizes understanding of the findings of  todays visit and agrees with plan of treatment. I have discussed any further diagnostic evaluation that may be needed or ordered today. We also reviewed her medications today. she has been encouraged to call the office with any questions or concerns that should arise related to todays visit.  Pt was instructed to be careful to exposures to smoking, to sick people and to extreme weathers. To notify the office as soon as the patient starts to have any symptoms of cough , fatigue and fever. Proper use of MDI is stressed along with compliance.   Rest and increase fluids. Continue using OTC medication to control symptoms.   This patient was seen by Leretha Pol FNP Collaboration with Dr Lavera Guise as a part of collaborative care agreement  Orders Placed This Encounter  Procedures  . POCT HgB A1C    Meds ordered this encounter  Medications  . albuterol (PROAIR HFA) 108 (90 Base) MCG/ACT inhaler    Sig: Inhale 2 puffs into the lungs every 4 (four) hours as needed for wheezing or shortness of breath.    Dispense:  3 Inhaler    Refill:  3    Order Specific Question:   Supervising Provider    Answer:   Lavera Guise [[id number]]  . DISCONTD: ALPRAZolam (XANAX) 0.5 MG tablet    Sig: TAKE 1/2 (ONE-HALF) TABLET BY MOUTH IN THE MORNING AND TAKE 1 TABLET IN THE EVENING.    Dispense:  60 tablet    Refill:  3    Order Specific Question:   Supervising Provider    Answer:   Lavera Guise [[id number]]  . ALPRAZolam (XANAX) 0.5 MG tablet    Sig: TAKE 1/2 (ONE-HALF) TABLET BY MOUTH IN THE MORNING AND TAKE 1 TABLET IN THE EVENING.    Dispense:  60 tablet    Refill:  3    Order Specific Question:  Supervising Provider    Answer:   Lavera Guise [[id number]]  . doxycycline (VIBRA-TABS) 100 MG tablet    Sig: Take 1 tablet (100 mg total) by mouth 2 (two) times daily.    Dispense:  42 tablet    Refill:  2    Order Specific Question:   Supervising Provider    Answer:   Lavera Guise [[id number]]    Time spent: 83  Minutes      Dr Lavera Guise Internal medicine

## 2018-01-29 DIAGNOSIS — D369 Benign neoplasm, unspecified site: Secondary | ICD-10-CM | POA: Diagnosis not present

## 2018-02-09 ENCOUNTER — Other Ambulatory Visit: Payer: Self-pay | Admitting: Internal Medicine

## 2018-02-10 DIAGNOSIS — D126 Benign neoplasm of colon, unspecified: Secondary | ICD-10-CM | POA: Diagnosis not present

## 2018-02-10 DIAGNOSIS — J4551 Severe persistent asthma with (acute) exacerbation: Secondary | ICD-10-CM | POA: Diagnosis not present

## 2018-02-26 ENCOUNTER — Ambulatory Visit (INDEPENDENT_AMBULATORY_CARE_PROVIDER_SITE_OTHER): Payer: PPO | Admitting: Internal Medicine

## 2018-02-26 VITALS — BP 142/88 | HR 81 | Resp 16 | Ht 63.0 in | Wt 193.0 lb

## 2018-02-26 DIAGNOSIS — J45909 Unspecified asthma, uncomplicated: Secondary | ICD-10-CM | POA: Diagnosis not present

## 2018-02-26 DIAGNOSIS — I10 Essential (primary) hypertension: Secondary | ICD-10-CM | POA: Diagnosis not present

## 2018-02-26 DIAGNOSIS — R918 Other nonspecific abnormal finding of lung field: Secondary | ICD-10-CM | POA: Diagnosis not present

## 2018-02-26 DIAGNOSIS — F411 Generalized anxiety disorder: Secondary | ICD-10-CM

## 2018-02-26 NOTE — Progress Notes (Signed)
[facility] [place], [place] [address]  Pulmonary Sleep Medicine   Office Visit Note  Patient Name: Cassie Ross DOB: 24-Aug-1960 MRN [id number]  Date of Service: 02/26/2018  Complaints/HPI: Pt is here for surgical clearance.  She states that on her last colonoscopy there was an area they could not reach that they were concerned about.  She has been told she will either need to have a colonoscopy guided surgery or she will need an open surgery to remove part of her intestine.  She would like clearance to have the open surgery as it has a better probability of getting 100% of the lesion removed.  She is here today to discuss surgical clearance.  ROS  General: (-) fever, (-) chills, (-) night sweats, (-) weakness Skin: (-) rashes, (-) itching,. Eyes: (-) visual changes, (-) redness, (-) itching. Nose and Sinuses: (-) nasal stuffiness or itchiness, (-) postnasal drip, (-) nosebleeds, (-) sinus trouble. Mouth and Throat: (-) sore throat, (-) hoarseness. Neck: (-) swollen glands, (-) enlarged thyroid, (-) neck pain. Respiratory: + cough, (-) bloody sputum, - shortness of breath, + wheezing. Cardiovascular: - ankle swelling, (-) chest pain. Lymphatic: (-) lymph node enlargement. Neurologic: (-) numbness, (-) tingling. Psychiatric: (-) anxiety, (-) depression   Current Medication: Outpatient Encounter Medications as of 02/26/2018  Medication Sig Note  . acetaminophen (TYLENOL) 500 MG tablet Take 500 mg by mouth as needed (Take 2 capsules by mouth as needed).   Marland Kitchen albuterol (PROAIR HFA) 108 (90 Base) MCG/ACT inhaler Inhale 2 puffs into the lungs every 4 (four) hours as needed for wheezing or shortness of breath.   Marland Kitchen albuterol (PROVENTIL) (2.5 MG/3ML) 0.083% nebulizer solution USE 1 VIAL IN NEBULIZER EVERY 6 HOURS AS NEEDED   . ALPRAZolam (XANAX) 0.5 MG tablet TAKE 1/2 (ONE-HALF) TABLET BY MOUTH IN THE MORNING AND TAKE 1 TABLET IN THE EVENING.   Marland Kitchen aspirin EC  81 MG tablet Take 81 mg by mouth daily.   Marland Kitchen atorvastatin (LIPITOR) 10 MG tablet Take 1 tablet (10 mg total) by mouth daily.   . budesonide-formoterol (SYMBICORT) 160-4.5 MCG/ACT inhaler Inhale 1 puff into the lungs 2 (two) times daily.   . Canagliflozin-Metformin HCl (INVOKAMET) 150-500 MG TABS Take 1 tablet by mouth at bedtime.   Marland Kitchen doxycycline (VIBRA-TABS) 100 MG tablet Take 1 tablet (100 mg total) by mouth 2 (two) times daily.   . furosemide (LASIX) 80 MG tablet Take 80 mg by mouth 2 (two) times daily.   Marland Kitchen glucose blood (TRUE METRIX BLOOD GLUCOSE TEST) test strip    . ibuprofen (ADVIL,MOTRIN) 800 MG tablet Take 800 mg by mouth every 8 (eight) hours as needed.   . insulin lispro (HUMALOG) 100 UNIT/ML injection Inject 2-10 Units into the skin 3 (three) times daily before meals. Based on sliding scale. For BS of 150-200, take 2 units, 201-250, take 4 units, 251-300, take 6 units, 301-350 take 8 units, 351-400 take 10 units. Over 400 contact physician.   . INVOKAMET XR 150-500 MG TB24 TAKE 1 TABLET BY MOUTH EVERY MORNING   . loratadine (CLARITIN) 10 MG tablet Take 10 mg by mouth daily.   . montelukast (SINGULAIR) 10 MG tablet Take 1 tablet (10 mg total) by mouth at bedtime.   . Multiple Vitamins-Minerals (ONE-A-DAY MENOPAUSE FORMULA PO) Take 1 tablet by mouth daily.   . Pantoprazole Sodium (PROTONIX PO) Take 40 mg by mouth.    . potassium chloride (K-DUR,KLOR-CON) 10 MEQ tablet Take 1 tablet (10  mEq total) by mouth 2 (two) times daily.   . predniSONE (DELTASONE) 10 MG tablet Take 10 mg by mouth daily with breakfast. Take 2 tab po 3 times a day   . sucralfate (CARAFATE) 1 g tablet Take 1 g by mouth 4 (four) times daily.   . theophylline (THEODUR) 300 MG 12 hr tablet Take 1 tablet (300 mg total) by mouth daily.   . TRUEPLUS LANCETS 28G MISC    . Chlorpheniramine-APAP (CORICIDIN) 2-325 MG TABS Take 2 tablets by mouth daily as needed.   . diltiazem (CARDIZEM CD) 180 MG 24 hr capsule Take 1 capsule (180  mg total) by mouth 2 (two) times daily.   Marland Kitchen triamcinolone (KENALOG) 0.025 % cream Apply 1 application topically 2 (two) times daily. (Patient not taking: Reported on 02/26/2018)   . [DISCONTINUED] Calcium Citrate-Vitamin D (CALCIUM + D PO) Take 2 tablets by mouth 2 (two) times daily.   . [DISCONTINUED] furosemide (LASIX) 40 MG tablet Take 80 mg by mouth daily.  03/19/2017: Pt only takes a second dose if needed.    No facility-administered encounter medications on file as of 02/26/2018.     Surgical History: Past Surgical History:  Procedure Laterality Date  . cataract surgery    . CESAREAN SECTION    . CHOLECYSTECTOMY    . COLONOSCOPY WITH PROPOFOL N/A 01/06/2018   Procedure: COLONOSCOPY WITH PROPOFOL;  Surgeon: Lollie Sails, MD;  Location: [facility] ENDOSCOPY;  Service: Endoscopy;  Laterality: N/A;  . ESOPHAGEAL DILATION    . ESOPHAGOGASTRODUODENOSCOPY (EGD) WITH PROPOFOL N/A 01/06/2018   Procedure: ESOPHAGOGASTRODUODENOSCOPY (EGD) WITH PROPOFOL;  Surgeon: Lollie Sails, MD;  Location[facility] ENDOSCOPY;  Service: Endoscopy;  Laterality: N/A;  . HERNIA REPAIR    . hiatial hernia repair    . NASAL SINUS SURGERY      Medical History: Past Medical History:  Diagnosis Date  . Asthma   . CHF (congestive heart failure) ([place])    1991- unknown after asthma attack  . Chicken pox   . COPD (chronic obstructive pulmonary disease) ([place])   . Diabetes ([place])   . DVT (deep venous thrombosis) ([place])   . GERD (gastroesophageal reflux disease)   . Hernia, hiatal   . HLD (hyperlipidemia)   . Hypertension   . Hypertension   . Seasonal allergies     Family History: Family History  Problem Relation Age of Onset  . Hypertension Mother     Social History: Social History   Socioeconomic History  . Marital status: Married    Spouse name: Not on file  . Number of children: Not on file  . Years of education: Not on file  . Highest education level: Not on file  Occupational History  . Not  on file  Social Needs  . Financial resource strain: Not on file  . Food insecurity:    Worry: Not on file    Inability: Not on file  . Transportation needs:    Medical: Not on file    Non-medical: Not on file  Tobacco Use  . Smoking status: Never Smoker  . Smokeless tobacco: Never Used  Substance and Sexual Activity  . Alcohol use: Not Currently    Comment: pt has not had alcohol since october  . Drug use: No  . Sexual activity: Not on file  Lifestyle  . Physical activity:    Days per week: Not on file    Minutes per session: Not on file  . Stress: Not on file  Relationships  . Social connections:    Talks on phone: Not on file    Gets together: Not on file    Attends religious service: Not on file    Active member of club or organization: Not on file    Attends meetings of clubs or organizations: Not on file    Relationship status: Not on file  . Intimate partner violence:    Fear of current or ex partner: Not on file    Emotionally abused: Not on file    Physically abused: Not on file    Forced sexual activity: Not on file  Other Topics Concern  . Not on file  Social History Narrative   Lives at home with family. Independent    Vital Signs: Blood pressure (!) 142/88, pulse 81, resp. rate 16, height 5\' 3"  (1.6 m), weight 193 lb (87.5 kg), SpO2 95 %.  Examination: General Appearance: The patient is well-developed, well-nourished, and in no distress. Skin: Gross inspection of skin unremarkable. Head: normocephalic, no gross deformities. Eyes: no gross deformities noted. ENT: ears appear grossly normal no exudates. Neck: Supple. No thyromegaly. No LAD. Respiratory: clear, diminished at times. Cardiovascular: Normal S1 and S2 without murmur or rub. Extremities: No cyanosis. pulses are equal. Neurologic: Alert and oriented. No involuntary movements.  LABS: Recent Results (from the past 2160 hour(s))  CBC with Differential/Platelet     Status: Abnormal    Collection Time: 12/04/17  9:29 AM  Result Value Ref Range   WBC 10.3 3.4 - 10.8 x10E3/uL   RBC 4.63 3.77 - 5.28 x10E6/uL   Hemoglobin 14.1 11.1 - 15.9 g/dL   Hematocrit 41.5 34.0 - 46.6 %   MCV 90 79 - 97 fL   MCH 30.5 26.6 - 33.0 pg   MCHC 34.0 31.5 - 35.7 g/dL   RDW 13.5 12.3 - 15.4 %   Platelets 347 150 - 450 x10E3/uL   Neutrophils 57 Not Estab. %   Lymphs 27 Not Estab. %   Monocytes 9 Not Estab. %   Eos 3 Not Estab. %   Basos 1 Not Estab. %   Neutrophils Absolute 6.0 1.4 - 7.0 x10E3/uL   Lymphocytes Absolute 2.8 0.7 - 3.1 x10E3/uL   Monocytes Absolute 0.9 0.1 - 0.9 x10E3/uL   EOS (ABSOLUTE) 0.3 0.0 - 0.4 x10E3/uL   Basophils Absolute 0.1 0.0 - 0.2 x10E3/uL   Immature Granulocytes 3 Not Estab. %   Immature Grans (Abs) 0.3 (H) 0.0 - 0.1 x10E3/uL    Comment: (An elevated percentage of Immature Granulocytes has not been found to be clinically significant as a sole clinical predictor of disease. Does NOT include bands or blast cells.  Pregnancy associated physiological leukocytosis may also show increased immature granulocytes without clinical significance.)   Lipid Panel With LDL/HDL Ratio     Status: None   Collection Time: 12/04/17  9:29 AM  Result Value Ref Range   Cholesterol, Total 163 100 - 199 mg/dL   Triglycerides 95 0 - 149 mg/dL   HDL 71 >39 mg/dL   VLDL Cholesterol Cal 19 5 - 40 mg/dL   LDL Calculated 73 0 - 99 mg/dL   LDl/HDL Ratio 1.0 0.0 - 3.2 ratio    Comment:                                     LDL/HDL Ratio  Men  Women                               1/2 Avg.Risk  1.0    1.5                                   Avg.Risk  3.6    3.2                                2X Avg.Risk  6.2    5.0                                3X Avg.Risk  8.0    6.1   TSH     Status: None   Collection Time: 12/04/17  9:29 AM  Result Value Ref Range   TSH 1.890 0.450 - 4.500 uIU/mL  T4, free     Status: None   Collection Time: 12/04/17   9:29 AM  Result Value Ref Range   Free T4 1.28 0.82 - 1.77 ng/dL  Comprehensive metabolic panel     Status: Abnormal   Collection Time: 12/04/17  9:29 AM  Result Value Ref Range   Glucose 71 65 - 99 mg/dL   BUN 18 6 - 24 mg/dL   Creatinine, Ser 0.74 0.57 - 1.00 mg/dL   GFR calc non Af Amer 90 >59 mL/min/1.73   GFR calc Af Amer 103 >59 mL/min/1.73   BUN/Creatinine Ratio 24 (H) 9 - 23   Sodium 147 (H) 134 - 144 mmol/L   Potassium 4.2 3.5 - 5.2 mmol/L   Chloride 102 96 - 106 mmol/L   CO2 27 20 - 29 mmol/L   Calcium 9.5 8.7 - 10.2 mg/dL   Total Protein 6.5 6.0 - 8.5 g/dL   Albumin 4.6 3.5 - 5.5 g/dL   Globulin, Total 1.9 1.5 - 4.5 g/dL   Albumin/Globulin Ratio 2.4 (H) 1.2 - 2.2   Bilirubin Total 0.5 0.0 - 1.2 mg/dL   Alkaline Phosphatase 100 39 - 117 IU/L   AST 13 0 - 40 IU/L   ALT 16 0 - 32 IU/L  Microalbumin / creatinine urine ratio     Status: None   Collection Time: 12/04/17  9:29 AM  Result Value Ref Range   Creatinine, Urine 103.3 Not Estab. mg/dL   Microalbumin, Urine 12.1 Not Estab. ug/mL   Microalb/Creat Ratio 11.7 0.0 - 30.0 mg/g creat    Comment:                      Normal:                0.0 -  30.0                      Albuminuria:          31.0 - 300.0                      Clinical albuminuria:       >300.0   VITAMIN D 25 Hydroxy (Vit-D Deficiency, Fractures)     Status: None   Collection Time: 12/04/17  9:29 AM  Result Value Ref Range   Vit D,  25-Hydroxy 40.6 30.0 - 100.0 ng/mL    Comment: Vitamin D deficiency has been defined by the [facility] practice guideline as a level of serum 25-OH vitamin D less than 20 ng/mL (1,2). The Endocrine Society went on to further define vitamin D insufficiency as a level between 21 and 29 ng/mL (2). 1. IOM (Institute of Medicine). 2010. Dietary reference    intakes for calcium and D. [place]: The    [employer]. 2. Holick MF, Binkley [place]on, Bischoff-Ferrari HA, et al.     Evaluation, treatment, and prevention of vitamin D    deficiency: an Endocrine Society clinical practice    guideline. JCEM. 2011 Jul; 96(7):1911-30.   T3     Status: None   Collection Time: 12/04/17  9:29 AM  Result Value Ref Range   T3, Total 102 71 - 180 ng/dL  Glucose, capillary     Status: None   Collection Time: 01/06/18 10:21 AM  Result Value Ref Range   Glucose-Capillary 84 70 - 99 mg/dL  Surgical pathology     Status: None   Collection Time: 01/06/18 11:29 AM  Result Value Ref Range   SURGICAL PATHOLOGY      Surgical Pathology CASE: [id number] PATIENT: [facility] Surgical Pathology Report     SPECIMEN SUBMITTED: A. Duodenum, atypical mucosa; cbx B. Stomach, antrum; cbx C. Stomach, body; cbx D. GEJ; cbx E. Esophagus, distal; cbx F. Colon polyp, hepatic flexure; cbx G. Colon polyp x2, cecum; cbx H. Rectum, distal; cbx  CLINICAL HISTORY: None provided  PRE-OPERATIVE DIAGNOSIS: PH polyps dysphagia  POST-OPERATIVE DIAGNOSIS: Esophagitis grade D; gastrophy; hernia; diverticulosis     DIAGNOSIS: A.  DUODENUM, ATYPICAL MUCOSA; COLD BIOPSY: - DUODENAL MUCOSA WITH INTACT VILLI. - NEGATIVE FOR ACTIVE INFLAMMATION, INTRAEPITHELIAL LYMPHOCYTOSIS, AND INFECTIOUS AGENTS.  B. STOMACH, ANTRUM; COLD BIOPSY: - MODERATE REACTIVE GASTROPATHY. - NEGATIVE FOR ACTIVE INFLAMMATION, H. PYLORI, INTESTINAL METAPLASIA, DYSPLASIA, AND MALIGNANCY.  C.  STOMACH, BODY; COLD BIOPSY: - UNREMARKABLE OXYNTIC MUCOSA. - NEGATIVE FOR H. PYLORI, INTESTINAL METAPLASIA, DYSPLASIA, A ND MALIGNANCY.  D.  GASTROESOPHAGEAL JUNCTION; COLD BIOPSY: - SQUAMOCOLUMNAR MUCOSA WITH MILD CHRONIC ACTIVE INFLAMMATION AND REACTIVE EPITHELIAL CHANGES CONSISTENT WITH REFLUX ESOPHAGITIS. - COLUMNAR-LINED MUCOSA WITH REACTIVE FOVEOLAR HYPERPLASIA AND EDEMA. - NEGATIVE FOR GOBLET CELLS, DYSPLASIA, AND MALIGNANCY.  E.  ESOPHAGUS, DISTAL; COLD BIOPSY: - EROSIVE ESOPHAGITIS. - NO VIRAL CYTOPATHIC  EFFECTS OR PILL FRAGMENTS IDENTIFIED. - NEGATIVE FOR DYSPLASIA AND MALIGNANCY.  F.  COLON POLYP, HEPATIC FLEXURE; COLD BIOPSY: - TUBULAR ADENOMA 3 FRAGMENTS. - NEGATIVE FOR HIGH-GRADE DYSPLASIA AND MALIGNANCY.  G.  COLON POLYP X 2, CECUM; COLD BIOPSY: - TUBULAR ADENOMAS, 2 FRAGMENTS. - NEGATIVE FOR HIGH-GRADE DYSPLASIA AND MALIGNANCY.  H.  RECTUM, DISTAL; COLD BIOPSY: - NONSPECIFIC [place]YPT HYPERPLASIA. - NEGATIVE FOR PROCTITIS, DYSPLASIA, AND MALIGNANCY.   GROSS DESCRIPTION: A. Labeled: Cbx atypical mucosa duodenum Received: In formalin Tissue fragment(s): 1 Size: 0. 5 cm Description: Tan fragment Entirely submitted in one cassette.  B. Labeled: Cbx gastric antrum Received: In formalin Tissue fragment(s): 1 Size: 0.6 cm Description: Tan fragment Entirely submitted in one cassette.  C. Labeled: Cbx gastric body Received: In formalin Tissue fragment(s): 2 Size: 0.2 and 0.4 cm Description: Tan fragments Entirely submitted in one cassette.  D. Labeled: Cbx GEJ Received: In formalin Tissue fragment(s): 2 Size: 0.5 and 0.6 cm Description: Tan fragments Entirely submitted in one cassette.  E. Labeled: Cbx distal esophagus Received: In formalin Tissue fragment(s): 2 Size: 0.1-0.2 cm Description: Tan-yellow  fragments Entirely submitted in one cassette.  F. Labeled: Cbx hepatic flexure polyp Received: In formalin Tissue fragment(s): 3 Size: 0.3-0.4 cm Description: Tan fragments Entirely submitted in one cassette.  G. Labeled: Cbx cecal polyp x2 Received: In formalin Tissue fragment(s): 2 Size: 0.3-0.4 cm Description : Tan fragments with a minimal amount of yellow fecal material Entirely submitted in one cassette.  H. Labeled: Cbx distal rectum Received: In formalin Tissue fragment(s): 2 Size: 0.1-0.15 cm Description: Tan-brown fragments Entirely submitted in one cassette.   Final Diagnosis performed by Bryan Lemma, MD.   Electronically signed 01/10/2018  3:24:21PM The electronic signature indicates that the named Attending Pathologist has evaluated the specimen  Technical component performed at [facility], [address] [place], [place] [address] Lab: [phone] Dir: Rush Farmer, MD, MMM  Professional component performed at [facility], [facility], [place], [place], [place] [address] Lab: [phone] Dir: Dellia Nims. Rubinas, MD   POCT HgB A1C     Status: Abnormal   Collection Time: 01/22/18  3:33 PM  Result Value Ref Range   Hemoglobin A1C 6.3 (A) 4.0 - 5.6 %   HbA1c POC (<> result, manual entry)     HbA1c, POC (prediabetic range)     HbA1c, POC (controlled diabetic range)      Radiology: No results found.  No results found.  No results found.    Assessment and Plan: Patient Active Problem List   Diagnosis Date Noted  . Acute upper respiratory infection 01/22/2018  . Generalized anxiety disorder 01/22/2018  . Venous stasis ulcer of left lower leg with edema of left lower leg (HCC) 12/07/2017  . Needs flu shot 12/07/2017  . Uncontrolled type 2 diabetes mellitus with hyperglycemia ([place]) 06/08/2017  . Atopic dermatitis 06/08/2017  . Severe asthma without complication 73/71/0626  . Hypokalemia 06/08/2017  . Influenza A 04/22/2016  . Essential hypertension 04/21/2016  . Diabetes ([place]) 04/21/2016  . GERD (gastroesophageal reflux disease) 04/21/2016  . Pneumonia 02/26/2016  . Severe persistent asthma with acute exacerbation 02/21/2016  . Primary osteoarthritis of left knee 12/27/2014  . DDD (degenerative disc disease), lumbar 09/28/2013  . Lumbar radiculitis 09/28/2013    1. Severe asthma without complication, unspecified whether persistent Stable, continue current medication regimen.  As for surgical clearance patient is considered high risk due to the physiology of her disease processes.  The risk and benefits of surgical procedures as always should be considered by the patient and surgical  team.  2. Generalized anxiety disorder Patient's anxiety is well controlled at this time.  3. Essential hypertension Stable, slightly elevated at this visit.  We will continue to monitor in the future.  General Counseling: I have discussed the findings of the evaluation and examination with Neoma Laming.  I have also discussed any further diagnostic evaluation thatmay be needed or ordered today. Laqueena verbalizes understanding of the findings of todays visit. We also reviewed her medications today and discussed drug interactions and side effects including but not limited excessive drowsiness and altered mental states. We also discussed that there is always a risk not just to her but also people around her. she has been encouraged to call the office with any questions or concerns that should arise related to todays visit.    Time spent: 25 This patient was seen by Orson Gear AGNP-C in Collaboration with Dr. Devona Konig as a part of collaborative care agreement.   I have personally obtained a history, examined the patient, evaluated laboratory and imaging results, formulated the assessment  and plan and placed orders.    Saadat A Khan, MD FCCP Pulmonary and Critical Care Sleep medicine 

## 2018-03-09 ENCOUNTER — Other Ambulatory Visit: Payer: Self-pay

## 2018-03-09 ENCOUNTER — Telehealth: Payer: Self-pay

## 2018-03-09 MED ORDER — ALBUTEROL SULFATE (2.5 MG/3ML) 0.083% IN NEBU: INHALATION_SOLUTION | RESPIRATORY_TRACT | 5 refills | Status: DC

## 2018-03-09 NOTE — Telephone Encounter (Signed)
Faxed note for surgical clearance to surgeon and pt advised

## 2018-03-13 ENCOUNTER — Inpatient Hospital Stay: Admission: RE | Admit: 2018-03-13 | Payer: PPO | Source: Ambulatory Visit

## 2018-03-23 ENCOUNTER — Other Ambulatory Visit: Payer: Self-pay

## 2018-03-23 ENCOUNTER — Inpatient Hospital Stay: Admit: 2018-03-23 | Payer: PPO | Admitting: General Surgery

## 2018-03-23 SURGERY — COLECTOMY, RIGHT, LAPAROSCOPIC
Anesthesia: Choice | Laterality: Right

## 2018-03-23 MED ORDER — THEOPHYLLINE ER 300 MG PO TB12: 300.0000 mg | ORAL_TABLET | Freq: Every day | ORAL | 1 refills | Status: DC

## 2018-04-14 ENCOUNTER — Other Ambulatory Visit: Payer: Self-pay | Admitting: Internal Medicine

## 2018-04-24 ENCOUNTER — Ambulatory Visit: Payer: Self-pay | Admitting: Nurse Practitioner

## 2018-04-27 ENCOUNTER — Encounter: Payer: Self-pay | Admitting: Nurse Practitioner

## 2018-04-27 ENCOUNTER — Ambulatory Visit (INDEPENDENT_AMBULATORY_CARE_PROVIDER_SITE_OTHER): Payer: PPO | Admitting: Nurse Practitioner

## 2018-04-27 VITALS — BP 130/87 | HR 106 | Resp 16 | Ht 63.0 in | Wt 203.8 lb

## 2018-04-27 DIAGNOSIS — J4550 Severe persistent asthma, uncomplicated: Secondary | ICD-10-CM | POA: Diagnosis not present

## 2018-04-27 DIAGNOSIS — D485 Neoplasm of uncertain behavior of skin: Secondary | ICD-10-CM

## 2018-04-27 DIAGNOSIS — E1165 Type 2 diabetes mellitus with hyperglycemia: Secondary | ICD-10-CM

## 2018-04-27 DIAGNOSIS — R911 Solitary pulmonary nodule: Secondary | ICD-10-CM | POA: Diagnosis not present

## 2018-04-27 DIAGNOSIS — Z800 Family history of malignant neoplasm of digestive organs: Secondary | ICD-10-CM

## 2018-04-27 DIAGNOSIS — D379 Neoplasm of uncertain behavior of digestive organ, unspecified: Secondary | ICD-10-CM

## 2018-04-27 DIAGNOSIS — I10 Essential (primary) hypertension: Secondary | ICD-10-CM | POA: Diagnosis not present

## 2018-04-27 LAB — POCT GLYCOSYLATED HEMOGLOBIN (HGB A1C): Hemoglobin A1C: 7.9 % — AB (ref 4.0–5.6)

## 2018-04-27 MED ORDER — INSULIN REGULAR HUMAN 100 UNIT/ML IJ SOLN: INTRAMUSCULAR | 5 refills | Status: DC

## 2018-04-27 MED ORDER — SEMAGLUTIDE(0.25 OR 0.5MG/DOS) 2 MG/1.5ML ~~LOC~~ SOPN: 0.5000 mg | PEN_INJECTOR | SUBCUTANEOUS | 3 refills | Status: DC

## 2018-04-27 NOTE — Progress Notes (Signed)
[facility] [place], [place] [address]  Internal MEDICINE  Office Visit Note  Patient Name: Cassie Ross  [id number]  [id number]  Date of Service: 05/01/2018  Chief Complaint  Patient presents with  . Medical Management of Chronic Issues    3 month follow up, pt stated her breathing was bothering her yesterday morning and she is currently on prednisone, pt stopped invokana due to painful urination  . Diabetes  . Hyperlipidemia  . Hypertension    Recently had colonoscopy and endoscopy performed. Found carpet polyp during colonoscopy. Measures about 39mm in diameter.  surgery no approved due to breathing issues.she and GI provider are unsure of what next step should be. GI provider feels as though a PET scan may be appropriate, as she does have history of pulmonary nodules as well as thyroid nodules which are being watched.  Has noted new development of nodular type lesions on face, just below the right eye which is nodular and itchy. Has had skin cancer in this area in the past. Overdue to have skin check by dermatology. The patient also states that blood sugar has been high. Was started on oral medication which she was not able to tolerate. She has been using sliding scale insulin prior to meals and nothing else to treat her blood sugars.       Current Medication: Outpatient Encounter Medications as of 04/27/2018  Medication Sig Note  . acetaminophen (TYLENOL) 500 MG tablet Take 1,000 mg by mouth every 8 (eight) hours as needed for moderate pain.    Marland Kitchen albuterol (PROAIR HFA) 108 (90 Base) MCG/ACT inhaler Inhale 2 puffs into the lungs every 4 (four) hours as needed for wheezing or shortness of breath.   Marland Kitchen albuterol (PROVENTIL) (2.5 MG/3ML) 0.083% nebulizer solution USE 1 VIAL IN NEBULIZER EVERY 6 HOURS AS NEEDED   . ALPRAZolam (XANAX) 0.5 MG tablet TAKE 1/2 (ONE-HALF) TABLET BY MOUTH IN THE MORNING AND TAKE 1 TABLET IN THE EVENING. (Patient taking  differently: Take 0.25-0.5 mg by mouth See admin instructions. TAKE 1/2 (ONE-HALF) TABLET BY MOUTH IN THE MORNING AND TAKE 1 TABLET IN THE EVENING.)   . aspirin EC 81 MG tablet Take 81 mg by mouth daily.   Marland Kitchen atorvastatin (LIPITOR) 10 MG tablet Take 1 tablet (10 mg total) by mouth daily.   . budesonide-formoterol (SYMBICORT) 160-4.5 MCG/ACT inhaler Inhale 1 puff into the lungs 2 (two) times daily.   . Chlorpheniramine-APAP (CORICIDIN) 2-325 MG TABS Take 2 tablets by mouth daily as needed (cold symptoms).    Marland Kitchen diltiazem (CARDIZEM CD) 180 MG 24 hr capsule TAKE 1 CAPSULE BY MOUTH TWICE DAILY   . doxycycline (VIBRA-TABS) 100 MG tablet Take 1 tablet (100 mg total) by mouth 2 (two) times daily.   . fluticasone (FLONASE) 50 MCG/ACT nasal spray Place 1 spray into both nostrils every morning.   . furosemide (LASIX) 80 MG tablet Take 80 mg by mouth 2 (two) times daily. 03/05/2018: MED ON HOLD   . glucose blood (TRUE METRIX BLOOD GLUCOSE TEST) test strip    . ibuprofen (ADVIL,MOTRIN) 200 MG tablet Take 200 mg by mouth every 8 (eight) hours as needed (pain).    . insulin regular (HUMULIN R) 100 units/mL injection Sliding scale - use as directed QAC per sliding scale instructions. Max daily dose is 25 units in 24 hours. E11.65   . loratadine (CLARITIN) 10 MG tablet Take 10 mg by mouth at bedtime.    . montelukast (SINGULAIR) 10  MG tablet Take 1 tablet (10 mg total) by mouth at bedtime.   . Multiple Vitamins-Minerals (ONE-A-DAY MENOPAUSE FORMULA PO) Take 1 tablet by mouth daily.   . pantoprazole (PROTONIX) 40 MG tablet Take 40 mg by mouth 2 (two) times daily.    . potassium chloride (K-DUR,KLOR-CON) 10 MEQ tablet Take 1 tablet (10 mEq total) by mouth 2 (two) times daily.   . predniSONE (DELTASONE) 20 MG tablet Take 20 mg by mouth daily with breakfast.    . theophylline (THEODUR) 300 MG 12 hr tablet Take 1 tablet (300 mg total) by mouth daily.   . TRUEPLUS LANCETS 28G MISC    . [DISCONTINUED] insulin regular  (HUMULIN R) 100 units/mL injection Inject 2-6 Units into the skin 3 (three) times daily as needed for high blood sugar.   . [DISCONTINUED] insulin regular (HUMULIN R) 100 units/mL injection Sliding scale - use as directed QAC per sliding scale instructions   . Semaglutide,0.25 or 0.5MG /DOS, (OZEMPIC, 0.25 OR 0.5 MG/DOSE,) 2 MG/1.5ML SOPN Inject 0.5 mg into the skin once a week.    No facility-administered encounter medications on file as of 04/27/2018.     Surgical History: Past Surgical History:  Procedure Laterality Date  . cataract surgery    . CESAREAN SECTION    . CHOLECYSTECTOMY    . COLONOSCOPY WITH PROPOFOL N/A 01/06/2018   Procedure: COLONOSCOPY WITH PROPOFOL;  Surgeon: Lollie Sails, MD;  Location: [facility] ENDOSCOPY;  Service: Endoscopy;  Laterality: N/A;  . ESOPHAGEAL DILATION    . ESOPHAGOGASTRODUODENOSCOPY (EGD) WITH PROPOFOL N/A 01/06/2018   Procedure: ESOPHAGOGASTRODUODENOSCOPY (EGD) WITH PROPOFOL;  Surgeon: Lollie Sails, MD;  Location[facility] ENDOSCOPY;  Service: Endoscopy;  Laterality: N/A;  . HERNIA REPAIR    . hiatial hernia repair    . NASAL SINUS SURGERY      Medical History: Past Medical History:  Diagnosis Date  . Asthma   . CHF (congestive heart failure) ([place])    1991- unknown after asthma attack  . Chicken pox   . COPD (chronic obstructive pulmonary disease) ([place])   . Diabetes ([place])   . DVT (deep venous thrombosis) ([place])   . GERD (gastroesophageal reflux disease)   . Hernia, hiatal   . HLD (hyperlipidemia)   . Hypertension   . Hypertension   . Seasonal allergies     Family History: Family History  Problem Relation Age of Onset  . Hypertension Mother     Social History   Socioeconomic History  . Marital status: Married    Spouse name: Not on file  . Number of children: Not on file  . Years of education: Not on file  . Highest education level: Not on file  Occupational History  . Not on file  Social Needs  . Financial resource  strain: Not on file  . Food insecurity:    Worry: Not on file    Inability: Not on file  . Transportation needs:    Medical: Not on file    Non-medical: Not on file  Tobacco Use  . Smoking status: Never Smoker  . Smokeless tobacco: Never Used  Substance and Sexual Activity  . Alcohol use: Not Currently    Comment: pt has not had alcohol since october  . Drug use: No  . Sexual activity: Not on file  Lifestyle  . Physical activity:    Days per week: Not on file    Minutes per session: Not on file  . Stress: Not on file  Relationships  .  Social connections:    Talks on phone: Not on file    Gets together: Not on file    Attends religious service: Not on file    Active member of club or organization: Not on file    Attends meetings of clubs or organizations: Not on file    Relationship status: Not on file  . Intimate partner violence:    Fear of current or ex partner: Not on file    Emotionally abused: Not on file    Physically abused: Not on file    Forced sexual activity: Not on file  Other Topics Concern  . Not on file  Social History Narrative   Lives at home with family. Independent      Review of Systems  Constitutional: Negative for chills, diaphoresis, fatigue and unexpected weight change.  HENT: Negative for ear pain, postnasal drip, sinus pressure and sore throat.   Respiratory: Positive for shortness of breath and wheezing. Negative for cough and chest tightness.        Currently on prednisone taper due to flare of asthma.   Cardiovascular: Negative for chest pain and palpitations.  Gastrointestinal: Negative for abdominal pain, constipation, diarrhea, nausea and vomiting.  Endocrine: Negative for cold intolerance, heat intolerance, polydipsia and polyuria.       Elevated blod sugars .  Musculoskeletal: Positive for myalgias. Negative for arthralgias, back pain, gait problem and neck pain.  Skin: Positive for rash. Negative for color change.       Has noted  development of rash type lesion under the right eye. Very itchy and feels like nodules under the skin.   Allergic/Immunologic: Positive for environmental allergies. Negative for food allergies.  Neurological: Negative for dizziness and headaches.  Hematological: Negative for adenopathy. Does not bruise/bleed easily.  Psychiatric/Behavioral: Negative for agitation, behavioral problems (depression) and hallucinations. The patient is nervous/anxious.     Vital Signs: BP 130/87 (BP Location: Right Arm, Patient Position: Sitting, Cuff Size: Large)   Pulse (!) 106   Resp 16   Ht 5\' 3"  (1.6 m)   Wt 203 lb 12.8 oz (92.4 kg)   SpO2 94%   BMI 36.10 kg/m    Physical Exam Vitals signs and nursing note reviewed.  Constitutional:      General: She is not in acute distress.    Appearance: Normal appearance. She is well-developed. She is not diaphoretic.  HENT:     Head: Normocephalic and atraumatic.     Nose: Nose normal.     Mouth/Throat:     Pharynx: No oropharyngeal exudate.  Eyes:     Conjunctiva/sclera: Conjunctivae normal.     Pupils: Pupils are equal, round, and reactive to light.  Neck:     Musculoskeletal: Normal range of motion and neck supple.     Thyroid: No thyromegaly.     Vascular: No JVD.     Trachea: No tracheal deviation.  Cardiovascular:     Rate and Rhythm: Normal rate and regular rhythm.     Heart sounds: Normal heart sounds. No murmur. No friction rub. No gallop.   Pulmonary:     Effort: Pulmonary effort is normal. No respiratory distress.     Breath sounds: Wheezing present. No rales.  Chest:     Chest wall: No tenderness.     Breasts:        Right: No inverted nipple or mass.        Left: No inverted nipple or mass.  Abdominal:  General: Bowel sounds are normal.     Palpations: Abdomen is soft.     Tenderness: There is no abdominal tenderness.  Musculoskeletal: Normal range of motion.  Lymphadenopathy:     Cervical: No cervical adenopathy.  Skin:     General: Skin is warm and dry.     Capillary Refill: Capillary refill takes 2 to 3 seconds.       Neurological:     Mental Status: She is alert and oriented to person, place, and time.     Cranial Nerves: No cranial nerve deficit.  Psychiatric:        Behavior: Behavior normal.        Thought Content: Thought content normal.        Judgment: Judgment normal.    Assessment/Plan: 1. Uncontrolled type 2 diabetes mellitus with hyperglycemia (HCC) - POCT HgB A1C 7.5 today. Will start Ozempic. Sample provided. Instructions provided to start 0.25mg  weekly for 3 weeks, then increase to 0.5mg  weekly. Continue to use sliding scale insulin prior to meals. Monitor blood sugars closely. Reviewed ADA diet guidelines.  - Semaglutide,0.25 or 0.5MG /DOS, (OZEMPIC, 0.25 OR 0.5 MG/DOSE,) 2 MG/1.5ML SOPN; Inject 0.5 mg into the skin once a week.  Dispense: 1 pen; Refill: 3 - insulin regular (HUMULIN R) 100 units/mL injection; Sliding scale - use as directed QAC per sliding scale instructions. Max daily dose is 25 units in 24 hours. E11.65  Dispense: 10 mL; Refill: 5  2. Essential hypertension Stable. Continue bp medication as prescribed   3. Severe persistent asthma without complication Continue to use all inhalers and respiratory medications as prescribed   4. 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 Will get PET scan for further evaluation.  - NM PET (AXUMIN) SKULL BASE TO MID THIGH; Future  5. Family history of colon cancer Surgical removal of "carpet" lesion not recommended due to severity of asthma. Will get PET scan for further evaluation.  - NM PET (AXUMIN) SKULL BASE TO MID THIGH; Future  6. Pulmonary nodule PET scan ordered to furter evaluate pulmonary nodules.  - NM PET (AXUMIN) SKULL BASE TO MID THIGH; Future  7. Neoplasm of uncertain behavior of skin of face - Ambulatory referral to  Dermatology  General Counseling: brittni hult understanding of the findings of todays visit and agrees with plan of treatment. I have discussed any further diagnostic evaluation that may be needed or ordered today. We also reviewed her medications today. she has been encouraged to call the office with any questions or concerns that should arise related to todays visit.  Diabetes Counseling:  1. Addition of ACE inh/ ARB'S for nephroprotection. Microalbumin is updated  2. Diabetic foot care, prevention of complications. Podiatry consult 3. Exercise and lose weight.  4. Diabetic eye examination, Diabetic eye exam is updated  5. Monitor blood sugar closlely. nutrition counseling.  6. Sign and symptoms of hypoglycemia including shaking sweating,confusion and headaches.  This patient was seen by Leretha Pol FNP Collaboration with Dr Lavera Guise as a part of collaborative care agreement  Orders Placed This Encounter  Procedures  . NM PET (AXUMIN) SKULL BASE TO MID THIGH  . Ambulatory referral to Dermatology  . POCT HgB A1C    Meds ordered this encounter  Medications  . Semaglutide,0.25 or 0.5MG /DOS, (OZEMPIC, 0.25 OR 0.5 MG/DOSE,) 2 MG/1.5ML SOPN    Sig: Inject 0.5 mg into the skin once a week.    Dispense:  1 pen    Refill:  3    Sample for starting dose given today.    Order Specific Question:   Supervising Provider    Answer:   Lavera Guise [[id number]]  . DISCONTD: insulin regular (HUMULIN R) 100 units/mL injection    Sig: Sliding scale - use as directed QAC per sliding scale instructions    Dispense:  10 mL    Refill:  5    Order Specific Question:   Supervising Provider    Answer:   Lavera Guise [place]  . insulin regular (HUMULIN R) 100 units/mL injection    Sig: Sliding scale - use as directed QAC per sliding scale instructions. Max daily dose is 25 units in 24 hours. E11.65    Dispense:  10 mL    Refill:  5    Order Specific Question:   Supervising Provider    Answer:    Lavera Guise [[id number]]    Time spent: 60 Minutes      Dr Lavera Guise Internal medicine

## 2018-04-29 MED ORDER — INSULIN REGULAR HUMAN 100 UNIT/ML IJ SOLN: INTRAMUSCULAR | 5 refills | Status: DC

## 2018-05-01 DIAGNOSIS — Z800 Family history of malignant neoplasm of digestive organs: Secondary | ICD-10-CM | POA: Insufficient documentation

## 2018-05-01 DIAGNOSIS — R911 Solitary pulmonary nodule: Secondary | ICD-10-CM | POA: Insufficient documentation

## 2018-05-01 DIAGNOSIS — D379 Neoplasm of uncertain behavior of digestive organ, unspecified: Secondary | ICD-10-CM | POA: Insufficient documentation

## 2018-05-01 DIAGNOSIS — D485 Neoplasm of uncertain behavior of skin: Secondary | ICD-10-CM | POA: Insufficient documentation

## 2018-05-05 DIAGNOSIS — X32XXXA Exposure to sunlight, initial encounter: Secondary | ICD-10-CM | POA: Diagnosis not present

## 2018-05-05 DIAGNOSIS — D2371 Other benign neoplasm of skin of right lower limb, including hip: Secondary | ICD-10-CM | POA: Diagnosis not present

## 2018-05-05 DIAGNOSIS — L814 Other melanin hyperpigmentation: Secondary | ICD-10-CM | POA: Diagnosis not present

## 2018-05-05 DIAGNOSIS — L821 Other seborrheic keratosis: Secondary | ICD-10-CM | POA: Diagnosis not present

## 2018-05-05 DIAGNOSIS — L810 Postinflammatory hyperpigmentation: Secondary | ICD-10-CM | POA: Diagnosis not present

## 2018-05-13 DIAGNOSIS — K2210 Ulcer of esophagus without bleeding: Secondary | ICD-10-CM | POA: Diagnosis not present

## 2018-05-13 DIAGNOSIS — D126 Benign neoplasm of colon, unspecified: Secondary | ICD-10-CM | POA: Diagnosis not present

## 2018-05-15 ENCOUNTER — Other Ambulatory Visit: Payer: Self-pay | Admitting: Internal Medicine

## 2018-05-15 MED ORDER — AZITHROMYCIN 250 MG PO TABS: ORAL_TABLET | ORAL | 1 refills | Status: DC

## 2018-05-15 NOTE — Telephone Encounter (Signed)
As per heather send azithromycin and prednisone

## 2018-05-18 ENCOUNTER — Encounter: Payer: Self-pay | Admitting: Internal Medicine

## 2018-05-18 ENCOUNTER — Other Ambulatory Visit: Payer: Self-pay | Admitting: Internal Medicine

## 2018-05-18 ENCOUNTER — Ambulatory Visit: Payer: PPO | Admitting: Internal Medicine

## 2018-05-18 ENCOUNTER — Other Ambulatory Visit: Payer: Self-pay

## 2018-05-18 VITALS — BP 120/80 | HR 72 | Temp 98.2°F | Resp 16 | Ht 63.0 in | Wt 201.0 lb

## 2018-05-18 DIAGNOSIS — R0602 Shortness of breath: Secondary | ICD-10-CM

## 2018-05-18 DIAGNOSIS — J4551 Severe persistent asthma with (acute) exacerbation: Secondary | ICD-10-CM | POA: Diagnosis not present

## 2018-05-18 DIAGNOSIS — K219 Gastro-esophageal reflux disease without esophagitis: Secondary | ICD-10-CM

## 2018-05-18 DIAGNOSIS — D379 Neoplasm of uncertain behavior of digestive organ, unspecified: Secondary | ICD-10-CM | POA: Diagnosis not present

## 2018-05-18 NOTE — Patient Instructions (Signed)

## 2018-05-18 NOTE — Progress Notes (Signed)
[facility] [place], [place] [address]  Pulmonary Sleep Medicine   Office Visit Note  Patient Name: Cassie Ross DOB: August 28, 1960 MRN [id number]  Date of Service: 05/18/2018  Complaints/HPI: She states that she has a colon polyp which needs to be taken out. Patient needs preop evaluation. Patient has history of Asthma which has been more stable of late. She uses her inhalers as prescribed. Patient states that she has daily wheeze which is her norm. She has had surgery in the past 2007 and she did ok with coming off. Her spirometry shows FEV1 of 1.1 Liters. She has not been recently admitted to the hospital. It is felt that she is at risk for colon cancer so the hemicolectomy is felt needed. In addition there is a strong family history. Currently on albuterol and symbicort as well as singulair  ROS  General: (-) fever, (-) chills, (-) night sweats, (-) weakness Skin: (-) rashes, (-) itching,. Eyes: (-) visual changes, (-) redness, (-) itching. Nose and Sinuses: (-) nasal stuffiness or itchiness, (-) postnasal drip, (-) nosebleeds, (-) sinus trouble. Mouth and Throat: (-) sore throat, (-) hoarseness. Neck: (-) swollen glands, (-) enlarged thyroid, (-) neck pain. Respiratory: + cough, (-) bloody sputum, + shortness of breath, - wheezing. Cardiovascular: - ankle swelling, (-) chest pain. Lymphatic: (-) lymph node enlargement. Neurologic: (-) numbness, (-) tingling. Psychiatric: (-) anxiety, (-) depression   Current Medication: Outpatient Encounter Medications as of 05/18/2018  Medication Sig Note  . acetaminophen (TYLENOL) 500 MG tablet Take 1,000 mg by mouth every 8 (eight) hours as needed for moderate pain.    Marland Kitchen albuterol (PROAIR HFA) 108 (90 Base) MCG/ACT inhaler Inhale 2 puffs into the lungs every 4 (four) hours as needed for wheezing or shortness of breath.   Marland Kitchen albuterol (PROVENTIL) (2.5 MG/3ML) 0.083% nebulizer solution USE 1 VIAL IN NEBULIZER EVERY 6  HOURS AS NEEDED   . ALPRAZolam (XANAX) 0.5 MG tablet TAKE 1/2 (ONE-HALF) TABLET BY MOUTH IN THE MORNING AND TAKE 1 TABLET IN THE EVENING. (Patient taking differently: Take 0.25-0.5 mg by mouth See admin instructions. TAKE 1/2 (ONE-HALF) TABLET BY MOUTH IN THE MORNING AND TAKE 1 TABLET IN THE EVENING.)   . aspirin EC 81 MG tablet Take 81 mg by mouth daily.   Marland Kitchen atorvastatin (LIPITOR) 10 MG tablet Take 1 tablet (10 mg total) by mouth daily.   Marland Kitchen azithromycin (ZITHROMAX) 250 MG tablet Take 1 tab po daily   . budesonide-formoterol (SYMBICORT) 160-4.5 MCG/ACT inhaler Inhale 1 puff into the lungs 2 (two) times daily.   . Chlorpheniramine-APAP (CORICIDIN) 2-325 MG TABS Take 2 tablets by mouth daily as needed (cold symptoms).    Marland Kitchen diltiazem (CARDIZEM CD) 180 MG 24 hr capsule TAKE 1 CAPSULE BY MOUTH TWICE DAILY   . doxycycline (VIBRA-TABS) 100 MG tablet Take 1 tablet (100 mg total) by mouth 2 (two) times daily.   . fluticasone (FLONASE) 50 MCG/ACT nasal spray Place 1 spray into both nostrils every morning.   . furosemide (LASIX) 80 MG tablet Take 80 mg by mouth 2 (two) times daily. 03/05/2018: MED ON HOLD   . glucose blood (TRUE METRIX BLOOD GLUCOSE TEST) test strip    . ibuprofen (ADVIL,MOTRIN) 200 MG tablet Take 200 mg by mouth every 8 (eight) hours as needed (pain).    . insulin regular (HUMULIN R) 100 units/mL injection Sliding scale - use as directed QAC per sliding scale instructions. Max daily dose is 25 units in 24 hours.  E11.65   . loratadine (CLARITIN) 10 MG tablet Take 10 mg by mouth at bedtime.    . montelukast (SINGULAIR) 10 MG tablet Take 1 tablet (10 mg total) by mouth at bedtime.   . Multiple Vitamins-Minerals (ONE-A-DAY MENOPAUSE FORMULA PO) Take 1 tablet by mouth daily.   . pantoprazole (PROTONIX) 40 MG tablet Take 40 mg by mouth 2 (two) times daily.    . potassium chloride (K-DUR,KLOR-CON) 10 MEQ tablet Take 1 tablet (10 mEq total) by mouth 2 (two) times daily.   . predniSONE (DELTASONE)  20 MG tablet TAKE AS DIRECTED   . Semaglutide,0.25 or 0.5MG /DOS, (OZEMPIC, 0.25 OR 0.5 MG/DOSE,) 2 MG/1.5ML SOPN Inject 0.5 mg into the skin once a week.   . theophylline (THEODUR) 300 MG 12 hr tablet Take 1 tablet (300 mg total) by mouth daily.   . TRUEPLUS LANCETS 28G MISC     No facility-administered encounter medications on file as of 05/18/2018.     Surgical History: Past Surgical History:  Procedure Laterality Date  . cataract surgery    . CESAREAN SECTION    . CHOLECYSTECTOMY    . COLONOSCOPY WITH PROPOFOL N/A 01/06/2018   Procedure: COLONOSCOPY WITH PROPOFOL;  Surgeon: Lollie Sails, MD;  Location: [facility] ENDOSCOPY;  Service: Endoscopy;  Laterality: N/A;  . ESOPHAGEAL DILATION    . ESOPHAGOGASTRODUODENOSCOPY (EGD) WITH PROPOFOL N/A 01/06/2018   Procedure: ESOPHAGOGASTRODUODENOSCOPY (EGD) WITH PROPOFOL;  Surgeon: Lollie Sails, MD;  Location: [facility] ENDOSCOPY;  Service: Endoscopy;  Laterality: N/A;  . HERNIA REPAIR    . hiatial hernia repair    . NASAL SINUS SURGERY      Medical History: Past Medical History:  Diagnosis Date  . Asthma   . CHF (congestive heart failure) ([place])    1991- unknown after asthma attack  . Chicken pox   . COPD (chronic obstructive pulmonary disease) ([place])   . Diabetes ([place])   . DVT (deep venous thrombosis) ([place])   . GERD (gastroesophageal reflux disease)   . Hernia, hiatal   . HLD (hyperlipidemia)   . Hypertension   . Hypertension   . Seasonal allergies     Family History: Family History  Problem Relation Age of Onset  . Hypertension Mother     Social History: Social History   Socioeconomic History  . Marital status: Married    Spouse name: Not on file  . Number of children: Not on file  . Years of education: Not on file  . Highest education level: Not on file  Occupational History  . Not on file  Social Needs  . Financial resource strain: Not on file  . Food insecurity:    Worry: Not on file    Inability: Not on file   . Transportation needs:    Medical: Not on file    Non-medical: Not on file  Tobacco Use  . Smoking status: Never Smoker  . Smokeless tobacco: Never Used  Substance and Sexual Activity  . Alcohol use: Not Currently    Comment: pt has not had alcohol since october  . Drug use: No  . Sexual activity: Not on file  Lifestyle  . Physical activity:    Days per week: Not on file    Minutes per session: Not on file  . Stress: Not on file  Relationships  . Social connections:    Talks on phone: Not on file    Gets together: Not on file    Attends religious service: Not on file  Active member of club or organization: Not on file    Attends meetings of clubs or organizations: Not on file    Relationship status: Not on file  . Intimate partner violence:    Fear of current or ex partner: Not on file    Emotionally abused: Not on file    Physically abused: Not on file    Forced sexual activity: Not on file  Other Topics Concern  . Not on file  Social History Narrative   Lives at home with family. Independent    Vital Signs: Blood pressure 120/80, pulse 72, temperature 98.2 F (36.8 C), resp. rate 16, height 5\' 3"  (1.6 m), weight 201 lb (91.2 kg), SpO2 96 %.  Examination: General Appearance: The patient is well-developed, well-nourished, and in no distress. Skin: Gross inspection of skin unremarkable. Head: normocephalic, no gross deformities. Eyes: no gross deformities noted. ENT: ears appear grossly normal no exudates. Neck: Supple. No thyromegaly. No LAD. Respiratory: few very distant rhonchi noted. Cardiovascular: Normal S1 and S2 without murmur or rub. Extremities: No cyanosis. pulses are equal. Neurologic: Alert and oriented. No involuntary movements.  LABS: Recent Results (from the past 2160 hour(s))  POCT HgB A1C     Status: Abnormal   Collection Time: 04/27/18  4:17 PM  Result Value Ref Range   Hemoglobin A1C 7.9 (A) 4.0 - 5.6 %   HbA1c POC (<> result, manual  entry)     HbA1c, POC (prediabetic range)     HbA1c, POC (controlled diabetic range)      Radiology: No results found.  No results found.  No results found.    Assessment and Plan: Patient Active Problem List   Diagnosis Date Noted  . 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 Family history of colon cancer 05/01/2018  . Pulmonary nodule 05/01/2018  . Neoplasm of uncertain behavior of skin of face 05/01/2018  . Acute upper respiratory infection 01/22/2018  . Generalized anxiety disorder 01/22/2018  . Venous stasis ulcer of left lower leg with edema of left lower leg (HCC) 12/07/2017  . Needs flu shot 12/07/2017  . Uncontrolled type 2 diabetes mellitus with hyperglycemia ([place]) 06/08/2017  . Atopic dermatitis 06/08/2017  . Severe asthma without complication 74/25/9563  . Hypokalemia 06/08/2017  . Influenza A 04/22/2016  . Essential hypertension 04/21/2016  . Diabetes ([place]) 04/21/2016  . GERD (gastroesophageal reflux disease) 04/21/2016  . Pneumonia 02/26/2016  . Severe persistent asthma with acute exacerbation 02/21/2016  . Primary osteoarthritis of left knee 12/27/2014  . DDD (degenerative disc disease), lumbar 09/28/2013  . Lumbar radiculitis 09/28/2013    1. Chronic obstructive Asthma she has severe obstructive pattern on her spiro. Will get complete PFT andalso would suggest an ABG to be done.  2. Colon polyps Needs surgery as per GI recs 3. GERD Stable at this time  General Counseling: I have discussed the findings of the evaluation and examination with Neoma Laming.  I have also discussed any further diagnostic evaluation thatmay be needed or ordered today. [place]rence verbalizes understanding of the findings of todays visit. We also reviewed her medications today and discussed drug interactions and side effects including but not limited excessive drowsiness and altered mental states. We also discussed that there is always a risk not just to her but also  people around her. she has been encouraged to call the office with any questions or concerns that should arise related to todays visit.    Time spent: 27min  I have personally  obtained a history, examined the patient, evaluated laboratory and imaging results, formulated the assessment and plan and placed orders.    Allyne Gee, MD [facility] Pulmonary and Critical Care Sleep medicine

## 2018-05-19 ENCOUNTER — Other Ambulatory Visit: Payer: Self-pay

## 2018-05-19 MED ORDER — ONETOUCH VERIO W/DEVICE KIT: PACK | 0 refills | Status: DC

## 2018-05-19 MED ORDER — GLUCOSE BLOOD VI STRP: ORAL_STRIP | 3 refills | Status: DC

## 2018-05-20 ENCOUNTER — Other Ambulatory Visit: Payer: Self-pay

## 2018-05-20 ENCOUNTER — Ambulatory Visit: Payer: PPO | Attending: Internal Medicine

## 2018-05-20 DIAGNOSIS — R0602 Shortness of breath: Secondary | ICD-10-CM | POA: Insufficient documentation

## 2018-05-20 LAB — BLOOD GAS, ARTERIAL
Acid-Base Excess: 3.5 mmol/L — ABNORMAL HIGH (ref 0.0–2.0)
BICARBONATE: 27.8 mmol/L (ref 20.0–28.0)
FIO2: 0.21
O2 Saturation: 98.1 %
PO2 ART: 86 mmHg (ref 83.0–108.0)
Patient temperature: 37
pCO2 arterial: 40 mmHg (ref 32.0–48.0)
pH, Arterial: 7.45 (ref 7.350–7.450)

## 2018-05-21 ENCOUNTER — Other Ambulatory Visit: Payer: Self-pay

## 2018-05-21 MED ORDER — MONTELUKAST SODIUM 10 MG PO TABS: 10.0000 mg | ORAL_TABLET | Freq: Every day | ORAL | 1 refills | Status: DC

## 2018-05-27 ENCOUNTER — Ambulatory Visit (INDEPENDENT_AMBULATORY_CARE_PROVIDER_SITE_OTHER): Payer: PPO | Admitting: Internal Medicine

## 2018-05-27 DIAGNOSIS — J4551 Severe persistent asthma with (acute) exacerbation: Secondary | ICD-10-CM

## 2018-05-27 LAB — PULMONARY FUNCTION TEST

## 2018-06-04 ENCOUNTER — Ambulatory Visit: Payer: Self-pay | Admitting: Adult Health

## 2018-06-05 ENCOUNTER — Telehealth: Payer: Self-pay

## 2018-06-10 ENCOUNTER — Telehealth: Payer: Self-pay

## 2018-06-10 NOTE — Telephone Encounter (Signed)
[facility] from insurance they do telephone visit do over for pt so they want [place] to they going send progress note they not taking over pt we still her pcp

## 2018-06-11 ENCOUNTER — Other Ambulatory Visit: Payer: Self-pay

## 2018-06-11 DIAGNOSIS — E876 Hypokalemia: Secondary | ICD-10-CM

## 2018-06-11 MED ORDER — ATORVASTATIN CALCIUM 10 MG PO TABS: 10.0000 mg | ORAL_TABLET | Freq: Every day | ORAL | 1 refills | Status: DC

## 2018-06-11 MED ORDER — POTASSIUM CHLORIDE CRYS ER 10 MEQ PO TBCR: 10.0000 meq | EXTENDED_RELEASE_TABLET | Freq: Two times a day (BID) | ORAL | 5 refills | Status: DC

## 2018-06-16 NOTE — Procedures (Signed)
[facility] [place], [id number]  DATE OF SERVICE: May 27, 2018  Complete Pulmonary Function Testing Interpretation:  FINDINGS:  The forced vital capacity is mildly decreased.  The FEV1 is 1.2 L which is 50% of predicted and is moderately decreased.  The FEV1 FVC ratio is moderately decreased.  Postbronchodilator there is no significant change in the FEV1 however clinical improvement may occur in the absence of spirometric improvement.  The total lung capacity is normal residual volume is increased residual volume total lung capacity ratio is increased total gas volume is normal.  DLCO is within normal limits.  IMPRESSION:  This pulmonary function study is consistent with moderate obstructive lung disease.  There is no significant change in the FEV1 postbronchodilator.  Allyne Gee, MD [facility] Pulmonary Critical Care Medicine Sleep Medicine

## 2018-07-01 ENCOUNTER — Encounter: Payer: Self-pay | Admitting: Nurse Practitioner

## 2018-07-07 ENCOUNTER — Telehealth: Payer: Self-pay

## 2018-07-07 ENCOUNTER — Other Ambulatory Visit: Payer: Self-pay | Admitting: Nurse Practitioner

## 2018-07-07 DIAGNOSIS — F411 Generalized anxiety disorder: Secondary | ICD-10-CM

## 2018-07-07 MED ORDER — ALPRAZOLAM 0.5 MG PO TABS: ORAL_TABLET | ORAL | 0 refills | Status: DC

## 2018-07-07 NOTE — Telephone Encounter (Signed)
Renewed rx for alprazolam 0.mg per request and sent to [place].

## 2018-07-07 NOTE — Telephone Encounter (Signed)
Pt advised we send med keep follow up

## 2018-07-07 NOTE — Progress Notes (Signed)
Renewed rx for alprazolam 0.mg per request and sent to [place].

## 2018-07-14 ENCOUNTER — Other Ambulatory Visit: Payer: Self-pay | Admitting: Nurse Practitioner

## 2018-07-14 MED ORDER — DILTIAZEM HCL ER COATED BEADS 180 MG PO CP24: 180.0000 mg | ORAL_CAPSULE | Freq: Two times a day (BID) | ORAL | 0 refills | Status: DC

## 2018-07-27 ENCOUNTER — Encounter: Payer: Self-pay | Admitting: Adult Health

## 2018-07-27 ENCOUNTER — Ambulatory Visit: Payer: PPO | Admitting: Adult Health

## 2018-07-27 ENCOUNTER — Other Ambulatory Visit: Payer: Self-pay

## 2018-07-27 DIAGNOSIS — R0602 Shortness of breath: Secondary | ICD-10-CM | POA: Diagnosis not present

## 2018-07-27 DIAGNOSIS — K219 Gastro-esophageal reflux disease without esophagitis: Secondary | ICD-10-CM

## 2018-07-27 DIAGNOSIS — J449 Chronic obstructive pulmonary disease, unspecified: Secondary | ICD-10-CM

## 2018-07-27 DIAGNOSIS — Z01818 Encounter for other preprocedural examination: Secondary | ICD-10-CM

## 2018-07-27 NOTE — Progress Notes (Signed)
[facility] [place], [place] [address]  Internal MEDICINE  Telephone Visit  Patient Name: Cassie Ross  [id number]  [id number]  Date of Service: 07/27/2018  I connected with the patient at 950 by video and verified the patients identity using two identifiers.   I discussed the limitations, risks, security and privacy concerns of performing an evaluation and management service by telephone and the availability of in person appointments. I also discussed with the patient that there may be a patient responsible charge related to the service.  The patient expressed understanding and agrees to proceed.    Chief Complaint  Patient presents with  . Telephone Assessment  . Telephone Screen  . Follow-up    PFT    HPI  Pt is seen via video for follow up on PFT and ABG results.  She reports Dr. Jaquita Rector is going to operate on a colon polyop and would like surgical clearance from pulmonary.  Pt reports her breathing has been well controlled.  Denies any recent issues or hospitalizations.  She is using her medications as prescribed without difficulty.  Her ABG resulted as normal, and her PFT shows moderate obstructive lung disease as expected.  Her FEV1  Was 1.2L which is 50% of the pre-predicted value.     Current Medication: Outpatient Encounter Medications as of 07/27/2018  Medication Sig Note  . acetaminophen (TYLENOL) 500 MG tablet Take 1,000 mg by mouth every 8 (eight) hours as needed for moderate pain.    Marland Kitchen albuterol (PROAIR HFA) 108 (90 Base) MCG/ACT inhaler Inhale 2 puffs into the lungs every 4 (four) hours as needed for wheezing or shortness of breath.   Marland Kitchen albuterol (PROVENTIL) (2.5 MG/3ML) 0.083% nebulizer solution USE 1 VIAL IN NEBULIZER EVERY 6 HOURS AS NEEDED   . ALPRAZolam (XANAX) 0.5 MG tablet TAKE 1/2 (ONE-HALF) TABLET BY MOUTH IN THE MORNING AND TAKE 1 TABLET IN THE EVENING.   Marland Kitchen aspirin EC 81 MG tablet Take 81 mg by mouth daily.   Marland Kitchen atorvastatin  (LIPITOR) 10 MG tablet Take 1 tablet (10 mg total) by mouth daily.   Marland Kitchen azithromycin (ZITHROMAX) 250 MG tablet Take 1 tab po daily   . Blood Glucose Monitoring Suppl (ONETOUCH VERIO) w/Device KIT Use as directed diag   . budesonide-formoterol (SYMBICORT) 160-4.5 MCG/ACT inhaler Inhale 1 puff into the lungs 2 (two) times daily.   . Chlorpheniramine-APAP (CORICIDIN) 2-325 MG TABS Take 2 tablets by mouth daily as needed (cold symptoms).    Marland Kitchen diltiazem (CARDIZEM CD) 180 MG 24 hr capsule Take 1 capsule (180 mg total) by mouth 2 (two) times daily.   Marland Kitchen doxycycline (VIBRA-TABS) 100 MG tablet Take 1 tablet (100 mg total) by mouth 2 (two) times daily.   . fluticasone (FLONASE) 50 MCG/ACT nasal spray Place 1 spray into both nostrils every morning.   . furosemide (LASIX) 80 MG tablet Take 80 mg by mouth 2 (two) times daily. 03/05/2018: MED ON HOLD   . glucose blood (ONETOUCH VERIO) test strip Use as instructed three times daily diag e11.65   . ibuprofen (ADVIL,MOTRIN) 200 MG tablet Take 200 mg by mouth every 8 (eight) hours as needed (pain).    . insulin regular (HUMULIN R) 100 units/mL injection Sliding scale - use as directed QAC per sliding scale instructions. Max daily dose is 25 units in 24 hours. E11.65   . loratadine (CLARITIN) 10 MG tablet Take 10 mg by mouth at bedtime.    . montelukast (SINGULAIR) 10  MG tablet Take 1 tablet (10 mg total) by mouth at bedtime.   . Multiple Vitamins-Minerals (ONE-A-DAY MENOPAUSE FORMULA PO) Take 1 tablet by mouth daily.   . pantoprazole (PROTONIX) 40 MG tablet Take 40 mg by mouth 2 (two) times daily.    . potassium chloride (K-DUR,KLOR-CON) 10 MEQ tablet Take 1 tablet (10 mEq total) by mouth 2 (two) times daily.   . predniSONE (DELTASONE) 20 MG tablet TAKE AS DIRECTED   . Semaglutide,0.25 or 0.5MG/DOS, (OZEMPIC, 0.25 OR 0.5 MG/DOSE,) 2 MG/1.5ML SOPN Inject 0.5 mg into the skin once a week.   . theophylline (THEODUR) 300 MG 12 hr tablet Take 1 tablet (300 mg total) by  mouth daily.    No facility-administered encounter medications on file as of 07/27/2018.     Surgical History: Past Surgical History:  Procedure Laterality Date  . cataract surgery    . CESAREAN SECTION    . CHOLECYSTECTOMY    . COLONOSCOPY WITH PROPOFOL N/A 01/06/2018   Procedure: COLONOSCOPY WITH PROPOFOL;  Surgeon: Lollie Sails, MD;  Location: [facility] ENDOSCOPY;  Service: Endoscopy;  Laterality: N/A;  . ESOPHAGEAL DILATION    . ESOPHAGOGASTRODUODENOSCOPY (EGD) WITH PROPOFOL N/A 01/06/2018   Procedure: ESOPHAGOGASTRODUODENOSCOPY (EGD) WITH PROPOFOL;  Surgeon: Lollie Sails, MD;  Location: [facility] ENDOSCOPY;  Service: Endoscopy;  Laterality: N/A;  . HERNIA REPAIR    . hiatial hernia repair    . NASAL SINUS SURGERY      Medical History: Past Medical History:  Diagnosis Date  . Asthma   . CHF (congestive heart failure) ([place])    1991- unknown after asthma attack  . Chicken pox   . COPD (chronic obstructive pulmonary disease) ([place])   . Diabetes ([place])   . DVT (deep venous thrombosis) ([place])   . GERD (gastroesophageal reflux disease)   . Hernia, hiatal   . HLD (hyperlipidemia)   . Hypertension   . Hypertension   . Seasonal allergies     Family History: Family History  Problem Relation Age of Onset  . Hypertension Mother     Social History   Socioeconomic History  . Marital status: Married    Spouse name: Not on file  . Number of children: Not on file  . Years of education: Not on file  . Highest education level: Not on file  Occupational History  . Not on file  Social Needs  . Financial resource strain: Not on file  . Food insecurity:    Worry: Not on file    Inability: Not on file  . Transportation needs:    Medical: Not on file    Non-medical: Not on file  Tobacco Use  . Smoking status: Never Smoker  . Smokeless tobacco: Never Used  Substance and Sexual Activity  . Alcohol use: Not Currently    Comment: pt has not had alcohol since october  . Drug  use: No  . Sexual activity: Not on file  Lifestyle  . Physical activity:    Days per week: Not on file    Minutes per session: Not on file  . Stress: Not on file  Relationships  . Social connections:    Talks on phone: Not on file    Gets together: Not on file    Attends religious service: Not on file    Active member of club or organization: Not on file    Attends meetings of clubs or organizations: Not on file    Relationship status: Not on file  .  Intimate partner violence:    Fear of current or ex partner: Not on file    Emotionally abused: Not on file    Physically abused: Not on file    Forced sexual activity: Not on file  Other Topics Concern  . Not on file  Social History Narrative   Lives at home with family. Independent      Review of Systems  Constitutional: Negative for chills, fatigue and unexpected weight change.  HENT: Negative for congestion, rhinorrhea, sneezing and sore throat.   Eyes: Negative for photophobia, pain and redness.  Respiratory: Negative for cough, chest tightness and shortness of breath.   Cardiovascular: Negative for chest pain and palpitations.  Gastrointestinal: Negative for abdominal pain, constipation, diarrhea, nausea and vomiting.  Endocrine: Negative.   Genitourinary: Negative for dysuria and frequency.  Musculoskeletal: Negative for arthralgias, back pain, joint swelling and neck pain.  Skin: Negative for rash.  Allergic/Immunologic: Negative.   Neurological: Negative for tremors and numbness.  Hematological: Negative for adenopathy. Does not bruise/bleed easily.  Psychiatric/Behavioral: Negative for behavioral problems and sleep disturbance. The patient is not nervous/anxious.     Vital Signs: There were no vitals taken for this visit.   Observation/Objective: Well appearing, NAD noted. Conversing with ease, speaking in full sentences.    Assessment/Plan: 1. Encounter for preoperative examination for general surgical  procedure Pt is clear for surgical procedure, as always we encourage surgical team and anesthesia to weigh risks and benefits of procedure with patient.    2. Chronic obstructive pulmonary disease, unspecified COPD type ([place]) Moderate disease, controlled at this time.  Continue with current medications.   3. Gastroesophageal reflux disease without esophagitis Stable, continue present management.   4. Shortness of breath Pt had consolidation noted on 2018 scan, and has had no follow up.  Will get Chest CT at this time.  - CT CHEST WO CONTRAST; Future  General Counseling: eusebia grulke understanding of the findings of today's phone visit and agrees with plan of treatment. I have discussed any further diagnostic evaluation that may be needed or ordered today. We also reviewed her medications today. she has been encouraged to call the office with any questions or concerns that should arise related to todays visit.    No orders of the defined types were placed in this encounter.   No orders of the defined types were placed in this encounter.   Time spent: [place] AGNP-C Internal medicine

## 2018-07-27 NOTE — Patient Instructions (Signed)
Chronic Obstructive Pulmonary Disease Chronic obstructive pulmonary disease (COPD) is a long-term (chronic) lung problem. When you have COPD, it is hard for air to get in and out of your lungs. Usually the condition gets worse over time, and your lungs will never return to normal. There are things you can do to keep yourself as healthy as possible.  Your doctor may treat your condition with: ? Medicines. ? Oxygen. ? Lung surgery.  Your doctor may also recommend: ? Rehabilitation. This includes steps to make your body work better. It may involve a team of specialists. ? Quitting smoking, if you smoke. ? Exercise and changes to your diet. ? Comfort measures (palliative care). Follow these instructions at home: Medicines  Take over-the-counter and prescription medicines only as told by your doctor.  Talk to your doctor before taking any cough or allergy medicines. You may need to avoid medicines that cause your lungs to be dry. Lifestyle  If you smoke, stop. Smoking makes the problem worse. If you need help quitting, ask your doctor.  Avoid being around things that make your breathing worse. This may include smoke, chemicals, and fumes.  Stay active, but remember to rest as well.  Learn and use tips on how to relax.  Make sure you get enough sleep. Most adults need at least 7 hours of sleep every night.  Eat healthy foods. Eat smaller meals more often. Rest before meals. Controlled breathing Learn and use tips on how to control your breathing as told by your doctor. Try:  Breathing in (inhaling) through your nose for 1 second. Then, pucker your lips and breath out (exhale) through your lips for 2 seconds.  Putting one hand on your belly (abdomen). Breathe in slowly through your nose for 1 second. Your hand on your belly should move out. Pucker your lips and breathe out slowly through your lips. Your hand on your belly should move in as you breathe out.  Controlled coughing Learn  and use controlled coughing to clear mucus from your lungs. Follow these steps: 1. Lean your head a little forward. 2. Breathe in deeply. 3. Try to hold your breath for 3 seconds. 4. Keep your mouth slightly open while coughing 2 times. 5. Spit any mucus out into a tissue. 6. Rest and do the steps again 1 or 2 times as needed. General instructions  Make sure you get all the shots (vaccines) that your doctor recommends. Ask your doctor about a flu shot and a pneumonia shot.  Use oxygen therapy and pulmonary rehabilitation if told by your doctor. If you need home oxygen therapy, ask your doctor if you should buy a tool to measure your oxygen level (oximeter).  Make a COPD action plan with your doctor. This helps you to know what to do if you feel worse than usual.  Manage any other conditions you have as told by your doctor.  Avoid going outside when it is very hot, cold, or humid.  Avoid people who have a sickness you can catch (contagious).  Keep all follow-up visits as told by your doctor. This is important. Contact a doctor if:  You cough up more mucus than usual.  There is a change in the color or thickness of the mucus.  It is harder to breathe than usual.  Your breathing is faster than usual.  You have trouble sleeping.  You need to use your medicines more often than usual.  You have trouble doing your normal activities such as getting dressed   or walking around the house. Get help right away if:  You have shortness of breath while resting.  You have shortness of breath that stops you from: ? Being able to talk. ? Doing normal activities.  Your chest hurts for longer than 5 minutes.  Your skin color is more blue than usual.  Your pulse oximeter shows that you have low oxygen for longer than 5 minutes.  You have a fever.  You feel too tired to breathe normally. Summary  Chronic obstructive pulmonary disease (COPD) is a long-term lung problem.  The way your  lungs work will never return to normal. Usually the condition gets worse over time. There are things you can do to keep yourself as healthy as possible.  Take over-the-counter and prescription medicines only as told by your doctor.  If you smoke, stop. Smoking makes the problem worse. This information is not intended to replace advice given to you by your health care provider. Make sure you discuss any questions you have with your health care provider. Document Released: 08/14/2007 Document Revised: 04/01/2016 Document Reviewed: 04/01/2016 Elsevier Interactive Patient Education  2019 [employer]  

## 2018-08-12 ENCOUNTER — Other Ambulatory Visit: Payer: Self-pay | Admitting: Gastroenterology

## 2018-08-12 ENCOUNTER — Other Ambulatory Visit (HOSPITAL_COMMUNITY): Payer: Self-pay | Admitting: Gastroenterology

## 2018-08-12 ENCOUNTER — Telehealth: Payer: Self-pay | Admitting: Internal Medicine

## 2018-08-12 DIAGNOSIS — R109 Unspecified abdominal pain: Secondary | ICD-10-CM

## 2018-08-12 DIAGNOSIS — R1011 Right upper quadrant pain: Secondary | ICD-10-CM | POA: Diagnosis not present

## 2018-08-12 NOTE — Telephone Encounter (Signed)
Done

## 2018-08-12 NOTE — Telephone Encounter (Signed)
HumuLIN R 100UNIT/ML solution   PA Case ID: [id number] - Rx #: [id number] Approved today PA Case: [id number], Status: Approved, Coverage Starts on: 08/12/2018 12:00:00 AM, Coverage Ends on: 03/11/2019 12:00:00 AM. Pharmacy notified .

## 2018-08-14 ENCOUNTER — Other Ambulatory Visit: Payer: Self-pay

## 2018-08-14 ENCOUNTER — Ambulatory Visit
Admission: RE | Admit: 2018-08-14 | Discharge: 2018-08-14 | Disposition: A | Discharge status: Home / Self Care | Payer: PPO | Source: Ambulatory Visit | Attending: Adult Health | Admitting: Adult Health

## 2018-08-14 DIAGNOSIS — R0602 Shortness of breath: Secondary | ICD-10-CM | POA: Diagnosis not present

## 2018-08-14 DIAGNOSIS — J984 Other disorders of lung: Secondary | ICD-10-CM | POA: Diagnosis not present

## 2018-08-18 ENCOUNTER — Encounter: Payer: Self-pay | Admitting: Internal Medicine

## 2018-08-18 ENCOUNTER — Ambulatory Visit (INDEPENDENT_AMBULATORY_CARE_PROVIDER_SITE_OTHER): Payer: PPO | Admitting: Internal Medicine

## 2018-08-18 ENCOUNTER — Other Ambulatory Visit: Payer: Self-pay

## 2018-08-18 VITALS — BP 137/78 | HR 103 | Resp 16 | Ht 63.0 in | Wt 207.0 lb

## 2018-08-18 DIAGNOSIS — Z01818 Encounter for other preprocedural examination: Secondary | ICD-10-CM

## 2018-08-18 DIAGNOSIS — R0602 Shortness of breath: Secondary | ICD-10-CM | POA: Diagnosis not present

## 2018-08-18 DIAGNOSIS — K219 Gastro-esophageal reflux disease without esophagitis: Secondary | ICD-10-CM

## 2018-08-18 DIAGNOSIS — J449 Chronic obstructive pulmonary disease, unspecified: Secondary | ICD-10-CM | POA: Diagnosis not present

## 2018-08-18 NOTE — Progress Notes (Signed)
[facility] [place], [place] [address]  Pulmonary Sleep Medicine   Office Visit Note  Patient Name: Cassie Ross DOB: 08/01/1960 MRN [id number]  Date of Service: 08/18/2018  Complaints/HPI: Pt here to review CT scan.    ROS  General: (-) fever, (-) chills, (-) night sweats, (-) weakness Skin: (-) rashes, (-) itching,. Eyes: (-) visual changes, (-) redness, (-) itching. Nose and Sinuses: (-) nasal stuffiness or itchiness, (-) postnasal drip, (-) nosebleeds, (-) sinus trouble. Mouth and Throat: (-) sore throat, (-) hoarseness. Neck: (-) swollen glands, (-) enlarged thyroid, (-) neck pain. Respiratory: - cough, (-) bloody sputum, - shortness of breath, - wheezing. Cardiovascular: - ankle swelling, (-) chest pain. Lymphatic: (-) lymph node enlargement. Neurologic: (-) numbness, (-) tingling. Psychiatric: (-) anxiety, (-) depression   Current Medication: Outpatient Encounter Medications as of 08/18/2018  Medication Sig Note  . acetaminophen (TYLENOL) 500 MG tablet Take 1,000 mg by mouth every 8 (eight) hours as needed for moderate pain.    Marland Kitchen albuterol (PROAIR HFA) 108 (90 Base) MCG/ACT inhaler Inhale 2 puffs into the lungs every 4 (four) hours as needed for wheezing or shortness of breath.   Marland Kitchen albuterol (PROVENTIL) (2.5 MG/3ML) 0.083% nebulizer solution USE 1 VIAL IN NEBULIZER EVERY 6 HOURS AS NEEDED   . ALPRAZolam (XANAX) 0.5 MG tablet TAKE 1/2 (ONE-HALF) TABLET BY MOUTH IN THE MORNING AND TAKE 1 TABLET IN THE EVENING.   Marland Kitchen aspirin EC 81 MG tablet Take 81 mg by mouth daily.   Marland Kitchen atorvastatin (LIPITOR) 10 MG tablet Take 1 tablet (10 mg total) by mouth daily.   . Blood Glucose Monitoring Suppl (ONETOUCH VERIO) w/Device KIT Use as directed diag   . budesonide-formoterol (SYMBICORT) 160-4.5 MCG/ACT inhaler Inhale 1 puff into the lungs 2 (two) times daily.   . Chlorpheniramine-APAP (CORICIDIN) 2-325 MG TABS Take 2 tablets by mouth daily as needed (cold  symptoms).    Marland Kitchen diltiazem (CARDIZEM CD) 180 MG 24 hr capsule Take 1 capsule (180 mg total) by mouth 2 (two) times daily.   Marland Kitchen doxycycline (VIBRA-TABS) 100 MG tablet Take 1 tablet (100 mg total) by mouth 2 (two) times daily.   . fluticasone (FLONASE) 50 MCG/ACT nasal spray Place 1 spray into both nostrils every morning.   . furosemide (LASIX) 80 MG tablet Take 80 mg by mouth 2 (two) times daily. 03/05/2018: MED ON HOLD   . glucose blood (ONETOUCH VERIO) test strip Use as instructed three times daily diag e11.65   . ibuprofen (ADVIL,MOTRIN) 200 MG tablet Take 200 mg by mouth every 8 (eight) hours as needed (pain).    . insulin regular (HUMULIN R) 100 units/mL injection Sliding scale - use as directed QAC per sliding scale instructions. Max daily dose is 25 units in 24 hours. E11.65   . loratadine (CLARITIN) 10 MG tablet Take 10 mg by mouth at bedtime.    . montelukast (SINGULAIR) 10 MG tablet Take 1 tablet (10 mg total) by mouth at bedtime.   . Multiple Vitamins-Minerals (ONE-A-DAY MENOPAUSE FORMULA PO) Take 1 tablet by mouth daily.   . pantoprazole (PROTONIX) 40 MG tablet Take 40 mg by mouth 2 (two) times daily.    . potassium chloride (K-DUR,KLOR-CON) 10 MEQ tablet Take 1 tablet (10 mEq total) by mouth 2 (two) times daily.   . predniSONE (DELTASONE) 20 MG tablet TAKE AS DIRECTED   . Semaglutide,0.25 or 0.5MG/DOS, (OZEMPIC, 0.25 OR 0.5 MG/DOSE,) 2 MG/1.5ML SOPN Inject 0.5 mg into the skin  once a week.   . theophylline (THEODUR) 300 MG 12 hr tablet Take 1 tablet (300 mg total) by mouth daily.   . [DISCONTINUED] azithromycin (ZITHROMAX) 250 MG tablet Take 1 tab po daily (Patient not taking: Reported on 08/18/2018)    No facility-administered encounter medications on file as of 08/18/2018.     Surgical History: Past Surgical History:  Procedure Laterality Date  . cataract surgery    . CESAREAN SECTION    . CHOLECYSTECTOMY    . COLONOSCOPY WITH PROPOFOL N/A 01/06/2018   Procedure: COLONOSCOPY WITH  PROPOFOL;  Surgeon: Lollie Sails, MD;  Location: [facility] ENDOSCOPY;  Service: Endoscopy;  Laterality: N/A;  . ESOPHAGEAL DILATION    . ESOPHAGOGASTRODUODENOSCOPY (EGD) WITH PROPOFOL N/A 01/06/2018   Procedure: ESOPHAGOGASTRODUODENOSCOPY (EGD) WITH PROPOFOL;  Surgeon: Lollie Sails, MD;  Location: [facility] ENDOSCOPY;  Service: Endoscopy;  Laterality: N/A;  . HERNIA REPAIR    . hiatial hernia repair    . NASAL SINUS SURGERY      Medical History: Past Medical History:  Diagnosis Date  . Asthma   . CHF (congestive heart failure) ([place])    1991- unknown after asthma attack  . Chicken pox   . COPD (chronic obstructive pulmonary disease) ([place])   . Diabetes ([place])   . DVT (deep venous thrombosis) ([place])   . GERD (gastroesophageal reflux disease)   . Hernia, hiatal   . HLD (hyperlipidemia)   . Hypertension   . Hypertension   . Seasonal allergies     Family History: Family History  Problem Relation Age of Onset  . Hypertension Mother     Social History: Social History   Socioeconomic History  . Marital status: Married    Spouse name: Not on file  . Number of children: Not on file  . Years of education: Not on file  . Highest education level: Not on file  Occupational History  . Not on file  Social Needs  . Financial resource strain: Not on file  . Food insecurity:    Worry: Not on file    Inability: Not on file  . Transportation needs:    Medical: Not on file    Non-medical: Not on file  Tobacco Use  . Smoking status: Never Smoker  . Smokeless tobacco: Never Used  Substance and Sexual Activity  . Alcohol use: Not Currently    Comment: pt has not had alcohol since october  . Drug use: No  . Sexual activity: Not on file  Lifestyle  . Physical activity:    Days per week: Not on file    Minutes per session: Not on file  . Stress: Not on file  Relationships  . Social connections:    Talks on phone: Not on file    Gets together: Not on file    Attends religious  service: Not on file    Active member of club or organization: Not on file    Attends meetings of clubs or organizations: Not on file    Relationship status: Not on file  . Intimate partner violence:    Fear of current or ex partner: Not on file    Emotionally abused: Not on file    Physically abused: Not on file    Forced sexual activity: Not on file  Other Topics Concern  . Not on file  Social History Narrative   Lives at home with family. Independent    Vital Signs: Blood pressure 137/78, pulse (!) 103, resp. rate 16,  height 5' 3"  (1.6 m), weight 207 lb (93.9 kg), SpO2 95 %.  Examination: General Appearance: The patient is well-developed, well-nourished, and in no distress. Skin: Gross inspection of skin unremarkable. Head: normocephalic, no gross deformities. Eyes: no gross deformities noted. ENT: ears appear grossly normal no exudates. Neck: Supple. No thyromegaly. No LAD. Respiratory: clear bilaterally. Cardiovascular: Normal S1 and S2 without murmur or rub. Extremities: No cyanosis. pulses are equal. Neurologic: Alert and oriented. No involuntary movements.  LABS: Recent Results (from the past 2160 hour(s))  Pulmonary function test     Status: None   Collection Time: 05/27/18  9:30 AM  Result Value Ref Range   FEV1     FVC     FEV1/FVC     TLC     DLCO      Radiology: Ct Chest Wo Contrast  Result Date: 08/15/2018 CLINICAL DATA:  Follow-up abnormal CT EXAM: CT CHEST WITHOUT CONTRAST TECHNIQUE: Multidetector CT imaging of the chest was performed following the standard protocol without IV contrast. COMPARISON:  08/19/2016 FINDINGS: Cardiovascular: No significant vascular findings. Normal heart size. Left coronary artery calcifications. No pericardial effusion. Mediastinum/Nodes: No enlarged mediastinal, hilar, or axillary lymph nodes. Thyroid gland, trachea, and esophagus demonstrate no significant findings. Lungs/Pleura: Bandlike scarring of the lingula at the site of  previously seen nodule and airspace opacity. Diffuse bilateral bronchial wall thickening. No pleural effusion or pneumothorax. Upper Abdomen: No acute abnormality. Musculoskeletal: No chest wall mass or suspicious bone lesions identified. IMPRESSION: 1. Bandlike scarring of the lingula at the site of previously seen nodule and airspace opacity, consistent with prior infection or inflammation. 2. Diffuse bilateral bronchial wall thickening, in keeping with nonspecific infectious or inflammatory bronchitis. 3.  Coronary artery disease. Electronically Signed   By: Eddie Candle M.D.   On: 08/15/2018 20:30    No results found.  Ct Chest Wo Contrast  Result Date: 08/15/2018 CLINICAL DATA:  Follow-up abnormal CT EXAM: CT CHEST WITHOUT CONTRAST TECHNIQUE: Multidetector CT imaging of the chest was performed following the standard protocol without IV contrast. COMPARISON:  08/19/2016 FINDINGS: Cardiovascular: No significant vascular findings. Normal heart size. Left coronary artery calcifications. No pericardial effusion. Mediastinum/Nodes: No enlarged mediastinal, hilar, or axillary lymph nodes. Thyroid gland, trachea, and esophagus demonstrate no significant findings. Lungs/Pleura: Bandlike scarring of the lingula at the site of previously seen nodule and airspace opacity. Diffuse bilateral bronchial wall thickening. No pleural effusion or pneumothorax. Upper Abdomen: No acute abnormality. Musculoskeletal: No chest wall mass or suspicious bone lesions identified. IMPRESSION: 1. Bandlike scarring of the lingula at the site of previously seen nodule and airspace opacity, consistent with prior infection or inflammation. 2. Diffuse bilateral bronchial wall thickening, in keeping with nonspecific infectious or inflammatory bronchitis. 3.  Coronary artery disease. Electronically Signed   By: Eddie Candle M.D.   On: 08/15/2018 20:30      Assessment and Plan: Patient Active Problem List   Diagnosis Date Noted  .  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 Family history of colon cancer 05/01/2018  . Pulmonary nodule 05/01/2018  . Neoplasm of uncertain behavior of skin of face 05/01/2018  . Acute upper respiratory infection 01/22/2018  . Generalized anxiety disorder 01/22/2018  . Venous stasis ulcer of left lower leg with edema of left lower leg (HCC) 12/07/2017  . Needs flu shot 12/07/2017  . Uncontrolled type 2 diabetes mellitus with hyperglycemia ([place]) 06/08/2017  . Atopic dermatitis 06/08/2017  . Severe asthma without complication 68/34/1962  .  Hypokalemia 06/08/2017  . Influenza A 04/22/2016  . Essential hypertension 04/21/2016  . Diabetes ([place]) 04/21/2016  . GERD (gastroesophageal reflux disease) 04/21/2016  . Pneumonia 02/26/2016  . Severe persistent asthma with acute exacerbation 02/21/2016  . Primary osteoarthritis of left knee 12/27/2014  . DDD (degenerative disc disease), lumbar 09/28/2013  . Lumbar radiculitis 09/28/2013    1. Chronic obstructive pulmonary disease, unspecified COPD type (HCC) Moderate disease, decently controlled at this time.  Continue medications as discussed.   2. Gastroesophageal reflux disease without esophagitis Stable, continue present management.   3. Encounter for preoperative examination for general surgical procedure Pt does not have an overt reason to be denied surgery.  She is however at moderate risk due to her respiratory issues.  The discression of anesthesia team should be used, when weighing risks vs benefits of the procedure.   4. SOB (shortness of breath) - Spirometry with Graph  General Counseling: I have discussed the findings of the evaluation and examination with Neoma Laming.  I have also discussed any further diagnostic evaluation thatmay be needed or ordered today. Rennie verbalizes understanding of the findings of todays visit. We also reviewed her medications today and discussed drug interactions and side effects  including but not limited excessive drowsiness and altered mental states. We also discussed that there is always a risk not just to her but also people around her. she has been encouraged to call the office with any questions or concerns that should arise related to todays visit.    Time spent: 20 This patient was seen by Orson Gear AGNP-C in Collaboration with Dr. Devona Konig as a part of collaborative care agreement.   I have personally obtained a history, examined the patient, evaluated laboratory and imaging results, formulated the assessment and plan and placed orders.    Allyne Gee, MD [facility] Pulmonary and Critical Care Sleep medicine

## 2018-08-27 DIAGNOSIS — D126 Benign neoplasm of colon, unspecified: Secondary | ICD-10-CM | POA: Diagnosis not present

## 2018-09-22 ENCOUNTER — Other Ambulatory Visit: Payer: Self-pay | Admitting: Internal Medicine

## 2018-09-22 MED ORDER — ALBUTEROL SULFATE (2.5 MG/3ML) 0.083% IN NEBU: INHALATION_SOLUTION | RESPIRATORY_TRACT | 5 refills | Status: DC

## 2018-10-02 DIAGNOSIS — D123 Benign neoplasm of transverse colon: Secondary | ICD-10-CM | POA: Diagnosis not present

## 2018-10-08 ENCOUNTER — Ambulatory Visit (INDEPENDENT_AMBULATORY_CARE_PROVIDER_SITE_OTHER): Payer: PPO | Admitting: Nurse Practitioner

## 2018-10-08 ENCOUNTER — Other Ambulatory Visit: Payer: Self-pay

## 2018-10-08 ENCOUNTER — Encounter: Payer: Self-pay | Admitting: Nurse Practitioner

## 2018-10-08 VITALS — Ht 63.0 in

## 2018-10-08 DIAGNOSIS — J45909 Unspecified asthma, uncomplicated: Secondary | ICD-10-CM | POA: Diagnosis not present

## 2018-10-08 DIAGNOSIS — Z0001 Encounter for general adult medical examination with abnormal findings: Secondary | ICD-10-CM

## 2018-10-08 DIAGNOSIS — F411 Generalized anxiety disorder: Secondary | ICD-10-CM | POA: Diagnosis not present

## 2018-10-08 DIAGNOSIS — I10 Essential (primary) hypertension: Secondary | ICD-10-CM

## 2018-10-08 DIAGNOSIS — R300 Dysuria: Secondary | ICD-10-CM | POA: Diagnosis not present

## 2018-10-08 DIAGNOSIS — E1165 Type 2 diabetes mellitus with hyperglycemia: Secondary | ICD-10-CM | POA: Diagnosis not present

## 2018-10-08 MED ORDER — ALPRAZOLAM 0.5 MG PO TABS: ORAL_TABLET | ORAL | 1 refills | Status: DC

## 2018-10-08 NOTE — Progress Notes (Signed)
[facility] [place], [place] [address]  Internal MEDICINE  Telephone Visit  Patient Name: Cassie Ross  [id number]  [id number]  Date of Service: 10/08/2018  I connected with the patient at 12:31pm by webcam and verified the patients identity using two identifiers.   I discussed the limitations, risks, security and privacy concerns of performing an evaluation and management service by webcam and the availability of in person appointments. I also discussed with the patient that there may be a patient responsible charge related to the service.  The patient expressed understanding and agrees to proceed.    Chief Complaint  Patient presents with  . Annual Exam  . Congestive Heart Failure  . Asthma  . COPD  . Diabetes  . Gastroesophageal Reflux  . Hyperlipidemia  . Hypertension  . Quality Metric Gaps    mammogram  . Telephone Assessment  . Telephone Screen  . Allergies  . Nasal Congestion    stuffy nose due to allergies. pt states she has tried flonase.     The patient has been contacted via webcam for follow up visit due to concerns for spread of novel coronavirus. She is scheduled to have removal of tubular adenoma on 10/15/2018. She states that she is having some mild issues with allergies. Breathing and asthma are currently well controlled. Feels well overall. She states blood sugars are doing well. She does take alpraaolam as needed for acute anxiety. She does need to have a refill for this today.       Current Medication: Outpatient Encounter Medications as of 10/08/2018  Medication Sig Note  . acetaminophen (TYLENOL) 500 MG tablet Take 1,000 mg by mouth every 8 (eight) hours as needed for moderate pain.    Marland Kitchen albuterol (PROAIR HFA) 108 (90 Base) MCG/ACT inhaler Inhale 2 puffs into the lungs every 4 (four) hours as needed for wheezing or shortness of breath.   Marland Kitchen albuterol (PROVENTIL) (2.5 MG/3ML) 0.083% nebulizer solution USE 1 VIAL IN NEBULIZER  EVERY 6 HOURS AS NEEDED   . ALPRAZolam (XANAX) 0.5 MG tablet TAKE 1/2 (ONE-HALF) TABLET BY MOUTH IN THE MORNING AND TAKE 1 TABLET IN THE EVENING.   Marland Kitchen aspirin EC 81 MG tablet Take 81 mg by mouth daily.   Marland Kitchen atorvastatin (LIPITOR) 10 MG tablet Take 1 tablet (10 mg total) by mouth daily.   . Blood Glucose Monitoring Suppl (ONETOUCH VERIO) w/Device KIT Use as directed diag   . budesonide-formoterol (SYMBICORT) 160-4.5 MCG/ACT inhaler Inhale 1 puff into the lungs 2 (two) times daily.   . Chlorpheniramine-APAP (CORICIDIN) 2-325 MG TABS Take 2 tablets by mouth daily as needed (cold symptoms).    Marland Kitchen diltiazem (CARDIZEM CD) 180 MG 24 hr capsule Take 1 capsule (180 mg total) by mouth 2 (two) times daily.   Marland Kitchen doxycycline (VIBRA-TABS) 100 MG tablet Take 1 tablet (100 mg total) by mouth 2 (two) times daily.   Marland Kitchen Fexofenadine-Pseudoephedrine (ALLEGRA-D 24 HOUR PO) Take by mouth at bedtime.   . fluticasone (FLONASE) 50 MCG/ACT nasal spray Place 1 spray into both nostrils every morning.   . furosemide (LASIX) 80 MG tablet Take 80 mg by mouth 2 (two) times daily. 03/05/2018: MED ON HOLD   . glucose blood (ONETOUCH VERIO) test strip Use as instructed three times daily diag e11.65   . ibuprofen (ADVIL,MOTRIN) 200 MG tablet Take 200 mg by mouth every 8 (eight) hours as needed (pain).    . insulin regular (HUMULIN R) 100 units/mL injection Sliding scale -  use as directed QAC per sliding scale instructions. Max daily dose is 25 units in 24 hours. E11.65   . montelukast (SINGULAIR) 10 MG tablet Take 1 tablet (10 mg total) by mouth at bedtime.   . Multiple Vitamins-Minerals (ONE-A-DAY MENOPAUSE FORMULA PO) Take 1 tablet by mouth daily.   . pantoprazole (PROTONIX) 40 MG tablet Take 40 mg by mouth 2 (two) times daily.    . potassium chloride (K-DUR,KLOR-CON) 10 MEQ tablet Take 1 tablet (10 mEq total) by mouth 2 (two) times daily.   . predniSONE (DELTASONE) 20 MG tablet TAKE AS DIRECTED   . Semaglutide,0.25 or 0.5MG/DOS,  (OZEMPIC, 0.25 OR 0.5 MG/DOSE,) 2 MG/1.5ML SOPN Inject 0.5 mg into the skin once a week.   . theophylline (THEODUR) 300 MG 12 hr tablet Take 1 tablet (300 mg total) by mouth daily.   . [DISCONTINUED] ALPRAZolam (XANAX) 0.5 MG tablet TAKE 1/2 (ONE-HALF) TABLET BY MOUTH IN THE MORNING AND TAKE 1 TABLET IN THE EVENING.   . [DISCONTINUED] loratadine (CLARITIN) 10 MG tablet Take 10 mg by mouth at bedtime.     No facility-administered encounter medications on file as of 10/08/2018.     Surgical History: Past Surgical History:  Procedure Laterality Date  . cataract surgery    . CESAREAN SECTION    . CHOLECYSTECTOMY    . COLONOSCOPY WITH PROPOFOL N/A 01/06/2018   Procedure: COLONOSCOPY WITH PROPOFOL;  Surgeon: Lollie Sails, MD;  Location: [facility] ENDOSCOPY;  Service: Endoscopy;  Laterality: N/A;  . ESOPHAGEAL DILATION    . ESOPHAGOGASTRODUODENOSCOPY (EGD) WITH PROPOFOL N/A 01/06/2018   Procedure: ESOPHAGOGASTRODUODENOSCOPY (EGD) WITH PROPOFOL;  Surgeon: Lollie Sails, MD;  Location: [facility] ENDOSCOPY;  Service: Endoscopy;  Laterality: N/A;  . HERNIA REPAIR    . hiatial hernia repair    . NASAL SINUS SURGERY      Medical History: Past Medical History:  Diagnosis Date  . Asthma   . CHF (congestive heart failure) ([place])    1991- unknown after asthma attack  . Chicken pox   . COPD (chronic obstructive pulmonary disease) ([place])   . Diabetes ([place])   . DVT (deep venous thrombosis) ([place])   . GERD (gastroesophageal reflux disease)   . Hernia, hiatal   . HLD (hyperlipidemia)   . Hypertension   . Hypertension   . Seasonal allergies     Family History: Family History  Problem Relation Age of Onset  . Hypertension Mother     Social History   Socioeconomic History  . Marital status: Married    Spouse name: Not on file  . Number of children: Not on file  . Years of education: Not on file  . Highest education level: Not on file  Occupational History  . Not on file  Social Needs   . Financial resource strain: Not on file  . Food insecurity    Worry: Not on file    Inability: Not on file  . Transportation needs    Medical: Not on file    Non-medical: Not on file  Tobacco Use  . Smoking status: Never Smoker  . Smokeless tobacco: Never Used  Substance and Sexual Activity  . Alcohol use: Not Currently    Comment: pt has not had alcohol since october  . Drug use: No  . Sexual activity: Not on file  Lifestyle  . Physical activity    Days per week: Not on file    Minutes per session: Not on file  . Stress: Not on file  Relationships  . Social Herbalist on phone: Not on file    Gets together: Not on file    Attends religious service: Not on file    Active member of club or organization: Not on file    Attends meetings of clubs or organizations: Not on file    Relationship status: Not on file  . Intimate partner violence    Fear of current or ex partner: Not on file    Emotionally abused: Not on file    Physically abused: Not on file    Forced sexual activity: Not on file  Other Topics Concern  . Not on file  Social History Narrative   Lives at home with family. Independent      Review of Systems  Constitutional: Negative for chills, diaphoresis, fatigue and unexpected weight change.  HENT: Negative for ear pain, postnasal drip, sinus pressure and sore throat.   Respiratory: Positive for shortness of breath and wheezing. Negative for cough and chest tightness.        Intermittent and currently well managed  Cardiovascular: Negative for chest pain and palpitations.  Gastrointestinal: Negative for abdominal pain, constipation, diarrhea, nausea and vomiting.  Endocrine: Negative for cold intolerance, heat intolerance, polydipsia and polyuria.       Blood sugars stable.   Musculoskeletal: Positive for myalgias. Negative for arthralgias, back pain, gait problem and neck pain.  Skin: Negative for color change and rash.           Allergic/Immunologic: Positive for environmental allergies. Negative for food allergies.  Neurological: Negative for dizziness and headaches.  Hematological: Negative for adenopathy. Does not bruise/bleed easily.  Psychiatric/Behavioral: Negative for agitation, behavioral problems (depression) and hallucinations. The patient is nervous/anxious.    Today's Vitals   10/08/18 1119  Height: _0  (1.6 m)   Body mass index is 36.67 kg/m.   Observation/Objective:   The patient is alert and oriented. She is pleasant and answers all questions appropriately. Breathing is non-labored. She is in no acute distress at this time.   Depression screen [facility] 2/9 10/08/2018 10/08/2018 07/27/2018 04/27/2018 04/27/2018  Decreased Interest 0 0 0 0 0  Down, Depressed, Hopeless 0 0 0 0 0  PHQ - 2 Score 0 0 0 0 0    Functional Status Survey: Is the patient deaf or have difficulty hearing?: No Does the patient have difficulty seeing, even when wearing glasses/contacts?: No Does the patient have difficulty concentrating, remembering, or making decisions?: No Does the patient have difficulty walking or climbing stairs?: No Does the patient have difficulty dressing or bathing?: No Does the patient have difficulty doing errands alone such as visiting a doctor's office or shopping?: No  MMSE - Mini Mental State Exam 09/23/2017  Orientation to time 5  Orientation to Place 5  Registration 3  Attention/ Calculation 5  Recall 3  Language- name 2 objects 2  Language- repeat 1  Language- follow 3 step command 3  Language- read & follow direction 1  Write a sentence 1  Copy design 1  Total score 30    Fall Risk  10/08/2018 10/08/2018 07/27/2018 04/27/2018 04/27/2018  Falls in the past year? 0 0 0 0 0  Number falls in past yr: - - 0 - 0  Injury with Fall? - - 0 - 0      Assessment/Plan: 1. Encounter for general adult medical examination with abnormal findings Annual health maintenance exam today  2.  Uncontrolled type 2 diabetes mellitus with  hyperglycemia ([place]) Continue diabetic medication as prescribed. Lab slip sent to check urine microalbumin. Will check HgbA1c at her next visit.   3. Severe asthma without complication, unspecified whether persistent Currently stable. Continue inhalers and respiratory medication as prescribed   4. Essential hypertension Stable. Continue bp medication as prescribed   5. Generali zed anxiety disorder May continue to take alprazolam as needed and as prescribed. New prescription sent to the pharmacy.  - ALPRAZolam (XANAX) 0.5 MG tablet; TAKE 1/2 (ONE-HALF) TABLET BY MOUTH IN THE MORNING AND TAKE 1 TABLET IN THE EVENING.  Dispense: 135 tablet; Refill: 1   General Counseling: Merlina verbalizes understanding of the findings of today's phone visit and agrees with plan of treatment. I have discussed any further diagnostic evaluation that may be needed or ordered today. We also reviewed her medications today. she has been encouraged to call the office with any questions or concerns that should arise related to todays visit.  Diabetes Counseling:  1. Addition of ACE inh/ ARB'S for nephroprotection. Microalbumin is updated  2. Diabetic foot care, prevention of complications. Podiatry consult 3. Exercise and lose weight.  4. Diabetic eye examination, Diabetic eye exam is updated  5. Monitor blood sugar closlely. nutrition counseling.  6. Sign and symptoms of hypoglycemia including shaking sweating,confusion and headaches.  This patient was seen by [place] with Dr Lavera Guise as a part of collaborative care agreement  Meds ordered this encounter  Medications  . ALPRAZolam (XANAX) 0.5 MG tablet    Sig: TAKE 1/2 (ONE-HALF) TABLET BY MOUTH IN THE MORNING AND TAKE 1 TABLET IN THE EVENING.    Dispense:  135 tablet    Refill:  1    Order Specific Question:   Supervising Provider    Answer:   Lavera Guise [[id number]]    Time spent: 50  Minutes    Dr Lavera Guise Internal medicine

## 2018-10-12 DIAGNOSIS — Z1159 Encounter for screening for other viral diseases: Secondary | ICD-10-CM | POA: Diagnosis not present

## 2018-10-15 ENCOUNTER — Other Ambulatory Visit: Payer: Self-pay

## 2018-10-15 DIAGNOSIS — K5730 Diverticulosis of large intestine without perforation or abscess without bleeding: Secondary | ICD-10-CM | POA: Diagnosis not present

## 2018-10-15 DIAGNOSIS — I10 Essential (primary) hypertension: Secondary | ICD-10-CM | POA: Diagnosis not present

## 2018-10-15 DIAGNOSIS — Z79899 Other long term (current) drug therapy: Secondary | ICD-10-CM | POA: Diagnosis not present

## 2018-10-15 DIAGNOSIS — Z6836 Body mass index (BMI) 36.0-36.9, adult: Secondary | ICD-10-CM | POA: Diagnosis not present

## 2018-10-15 DIAGNOSIS — D123 Benign neoplasm of transverse colon: Secondary | ICD-10-CM | POA: Diagnosis not present

## 2018-10-15 DIAGNOSIS — Z794 Long term (current) use of insulin: Secondary | ICD-10-CM | POA: Diagnosis not present

## 2018-10-15 DIAGNOSIS — Z86010 Personal history of colonic polyps: Secondary | ICD-10-CM | POA: Diagnosis not present

## 2018-10-15 DIAGNOSIS — J45909 Unspecified asthma, uncomplicated: Secondary | ICD-10-CM | POA: Diagnosis not present

## 2018-10-15 DIAGNOSIS — D125 Benign neoplasm of sigmoid colon: Secondary | ICD-10-CM | POA: Diagnosis not present

## 2018-10-15 DIAGNOSIS — Z7982 Long term (current) use of aspirin: Secondary | ICD-10-CM | POA: Diagnosis not present

## 2018-10-15 DIAGNOSIS — D126 Benign neoplasm of colon, unspecified: Secondary | ICD-10-CM | POA: Diagnosis not present

## 2018-10-15 DIAGNOSIS — Z880 Allergy status to penicillin: Secondary | ICD-10-CM | POA: Diagnosis not present

## 2018-10-15 DIAGNOSIS — E669 Obesity, unspecified: Secondary | ICD-10-CM | POA: Diagnosis not present

## 2018-10-15 DIAGNOSIS — Z9049 Acquired absence of other specified parts of digestive tract: Secondary | ICD-10-CM | POA: Diagnosis not present

## 2018-10-15 MED ORDER — THEOPHYLLINE ER 300 MG PO TB12: 300.0000 mg | ORAL_TABLET | Freq: Every day | ORAL | 1 refills | Status: DC

## 2018-10-20 ENCOUNTER — Other Ambulatory Visit: Payer: Self-pay | Admitting: Adult Health

## 2018-10-20 MED ORDER — DILTIAZEM HCL ER COATED BEADS 180 MG PO CP24: 180.0000 mg | ORAL_CAPSULE | Freq: Two times a day (BID) | ORAL | 2 refills | Status: DC

## 2018-10-23 ENCOUNTER — Other Ambulatory Visit: Payer: Self-pay | Admitting: Internal Medicine

## 2018-10-23 MED ORDER — ATORVASTATIN CALCIUM 10 MG PO TABS: 10.0000 mg | ORAL_TABLET | Freq: Every day | ORAL | 2 refills | Status: DC

## 2018-11-23 ENCOUNTER — Ambulatory Visit: Payer: PPO | Admitting: Internal Medicine

## 2018-11-23 ENCOUNTER — Encounter: Payer: Self-pay | Admitting: Internal Medicine

## 2018-11-23 ENCOUNTER — Other Ambulatory Visit: Payer: Self-pay

## 2018-11-23 DIAGNOSIS — Z79899 Other long term (current) drug therapy: Secondary | ICD-10-CM

## 2018-11-23 DIAGNOSIS — R918 Other nonspecific abnormal finding of lung field: Secondary | ICD-10-CM

## 2018-11-23 DIAGNOSIS — J45909 Unspecified asthma, uncomplicated: Secondary | ICD-10-CM

## 2018-11-23 DIAGNOSIS — I2510 Atherosclerotic heart disease of native coronary artery without angina pectoris: Secondary | ICD-10-CM | POA: Diagnosis not present

## 2018-11-23 NOTE — Progress Notes (Signed)
[facility] [place], [place] [address]  Internal MEDICINE  Telephone Visit  Patient Name: Cassie Ross  [id number]  [id number]  Date of Service: 11/24/2018  I connected with the patient at 1100 by telephone and verified the patients identity using two identifiers.   I discussed the limitations, risks, security and privacy concerns of performing an evaluation and management service by telephone and the availability of in person appointments. I also discussed with the patient that there may be a patient responsible charge related to the service.  The patient expressed understanding and agrees to proceed.    Chief Complaint  Patient presents with  . Telephone Screen  . Telephone Assessment  . COPD  . Medical Management of Chronic Issues    Need theophylline LABS     HPI  Pt is connected and seen by virtual visit to avoid the risks of pandemic for Covid 19. She has been on prednisone off and on to avoid flare of asthma, She is staying home most of the time to avoid risk of Corona virus. DM is under good control  Recent CT scan does show some chronic changes  1. Bandlike scarring of the lingula at the site of previously seen nodule and airspace opacity, consistent with prior infection or inflammation. 2. Diffuse bilateral bronchial wall thickening, in keeping with nonspecific infectious or inflammatory bronchitis. 3.  Coronary artery disease.  Current Medication:  Outpatient Encounter Medications as of 11/23/2018  Medication Sig Note  . acetaminophen (TYLENOL) 500 MG tablet Take 1,000 mg by mouth every 8 (eight) hours as needed for moderate pain.    Marland Kitchen albuterol (PROAIR HFA) 108 (90 Base) MCG/ACT inhaler Inhale 2 puffs into the lungs every 4 (four) hours as needed for wheezing or shortness of breath.   Marland Kitchen albuterol (PROVENTIL) (2.5 MG/3ML) 0.083% nebulizer solution USE 1 VIAL IN NEBULIZER EVERY 6 HOURS AS NEEDED   . ALPRAZolam (XANAX) 0.5 MG tablet TAKE  1/2 (ONE-HALF) TABLET BY MOUTH IN THE MORNING AND TAKE 1 TABLET IN THE EVENING.   Marland Kitchen aspirin EC 81 MG tablet Take 81 mg by mouth daily.   Marland Kitchen atorvastatin (LIPITOR) 10 MG tablet Take 1 tablet (10 mg total) by mouth daily.   . Blood Glucose Monitoring Suppl (ONETOUCH VERIO) w/Device KIT Use as directed diag   . budesonide-formoterol (SYMBICORT) 160-4.5 MCG/ACT inhaler Inhale 1 puff into the lungs 2 (two) times daily.   . Chlorpheniramine-APAP (CORICIDIN) 2-325 MG TABS Take 2 tablets by mouth daily as needed (cold symptoms).    Marland Kitchen diltiazem (CARDIZEM CD) 180 MG 24 hr capsule Take 1 capsule (180 mg total) by mouth 2 (two) times daily.   Marland Kitchen doxycycline (VIBRA-TABS) 100 MG tablet Take 1 tablet (100 mg total) by mouth 2 (two) times daily.   Marland Kitchen Fexofenadine-Pseudoephedrine (ALLEGRA-D 24 HOUR PO) Take by mouth at bedtime.   . fluticasone (FLONASE) 50 MCG/ACT nasal spray Place 1 spray into both nostrils every morning.   . furosemide (LASIX) 80 MG tablet Take 80 mg by mouth 2 (two) times daily. 03/05/2018: MED ON HOLD   . glucose blood (ONETOUCH VERIO) test strip Use as instructed three times daily diag e11.65   . ibuprofen (ADVIL,MOTRIN) 200 MG tablet Take 200 mg by mouth every 8 (eight) hours as needed (pain).    . insulin regular (HUMULIN R) 100 units/mL injection Sliding scale - use as directed QAC per sliding scale instructions. Max daily dose is 25 units in 24 hours. E11.65   .  montelukast (SINGULAIR) 10 MG tablet Take 1 tablet (10 mg total) by mouth at bedtime.   . Multiple Vitamins-Minerals (ONE-A-DAY MENOPAUSE FORMULA PO) Take 1 tablet by mouth daily.   . pantoprazole (PROTONIX) 40 MG tablet Take 40 mg by mouth 2 (two) times daily.    . potassium chloride (K-DUR,KLOR-CON) 10 MEQ tablet Take 1 tablet (10 mEq total) by mouth 2 (two) times daily.   . predniSONE (DELTASONE) 20 MG tablet TAKE AS DIRECTED   . Semaglutide,0.25 or 0.5MG/DOS, (OZEMPIC, 0.25 OR 0.5 MG/DOSE,) 2 MG/1.5ML SOPN Inject 0.5 mg into the  skin once a week.   . theophylline (THEODUR) 300 MG 12 hr tablet Take 1 tablet (300 mg total) by mouth daily.    No facility-administered encounter medications on file as of 11/23/2018.     Surgical History: Past Surgical History:  Procedure Laterality Date  . cataract surgery    . CESAREAN SECTION    . CHOLECYSTECTOMY    . COLONOSCOPY WITH PROPOFOL N/A 01/06/2018   Procedure: COLONOSCOPY WITH PROPOFOL;  Surgeon: Lollie Sails, MD;  Location[facility] ENDOSCOPY;  Service: Endoscopy;  Laterality: N/A;  . ESOPHAGEAL DILATION    . ESOPHAGOGASTRODUODENOSCOPY (EGD) WITH PROPOFOL N/A 01/06/2018   Procedure: ESOPHAGOGASTRODUODENOSCOPY (EGD) WITH PROPOFOL;  Surgeon: Lollie Sails, MD;  Location: [facility] ENDOSCOPY;  Service: Endoscopy;  Laterality: N/A;  . HERNIA REPAIR    . hiatial hernia repair    . NASAL SINUS SURGERY      Medical History: Past Medical History:  Diagnosis Date  . Asthma   . CHF (congestive heart failure) ([place])    1991- unknown after asthma attack  . Chicken pox   . COPD (chronic obstructive pulmonary disease) ([place])   . Diabetes ([place])   . DVT (deep venous thrombosis) ([place])   . GERD (gastroesophageal reflux disease)   . Hernia, hiatal   . HLD (hyperlipidemia)   . Hypertension   . Hypertension   . Seasonal allergies     Family History: Family History  Problem Relation Age of Onset  . Hypertension Mother     Social History   Socioeconomic History  . Marital status: Married    Spouse name: Not on file  . Number of children: Not on file  . Years of education: Not on file  . Highest education level: Not on file  Occupational History  . Not on file  Social Needs  . Financial resource strain: Not on file  . Food insecurity    Worry: Not on file    Inability: Not on file  . Transportation needs    Medical: Not on file    Non-medical: Not on file  Tobacco Use  . Smoking status: Never Smoker  . Smokeless tobacco: Never Used  Substance and Sexual  Activity  . Alcohol use: Not Currently    Comment: pt has not had alcohol since october  . Drug use: No  . Sexual activity: Not on file  Lifestyle  . Physical activity    Days per week: Not on file    Minutes per session: Not on file  . Stress: Not on file  Relationships  . Social Herbalist on phone: Not on file    Gets together: Not on file    Attends religious service: Not on file    Active member of club or organization: Not on file    Attends meetings of clubs or organizations: Not on file    Relationship status: Not on  file  . Intimate partner violence    Fear of current or ex partner: Not on file    Emotionally abused: Not on file    Physically abused: Not on file    Forced sexual activity: Not on file  Other Topics Concern  . Not on file  Social History Narrative   Lives at home with family. Independent    Review of Systems  Constitutional: Negative for chills, diaphoresis and fatigue.  HENT: Negative for ear pain, postnasal drip and sinus pressure.   Eyes: Negative for photophobia, discharge, redness, itching and visual disturbance.  Respiratory: Negative for cough, shortness of breath and wheezing.   Cardiovascular: Negative for chest pain, palpitations and leg swelling.  Gastrointestinal: Negative for abdominal pain, constipation, diarrhea, nausea and vomiting.  Genitourinary: Negative for dysuria and flank pain.  Musculoskeletal: Negative for arthralgias, back pain, gait problem and neck pain.  Skin: Negative for color change.  Allergic/Immunologic: Negative for environmental allergies and food allergies.  Neurological: Negative for dizziness and headaches.  Hematological: Does not bruise/bleed easily.  Psychiatric/Behavioral: Negative for agitation, behavioral problems (depression) and hallucinations.    Vital Signs: There were no vitals taken for this visit.   Observation/Objective: Pt was mildly sob however comfortable and NAD    Assessment/Plan: 1. Severe asthma without complication, unspecified whether persistent Continue all MDI as before, can be evaluated for newer modified asthma treatments   2. Encounter for long-term (current) use of medications - Theophylline level  3. Opacity of lung on imaging study - ANA w/Reflex if Positive; Future - Alpha-1-antitrypsin - Sed Rate (ESR)  4. Coronary artery calcification seen on CAT scan - Ambulatory referral to Cardiology.   General Counseling: darcus edds understanding of the findings of today's phone visit and agrees with plan of treatment. I have discussed any further diagnostic evaluation that may be needed or ordered today. We also reviewed her medications today. she has been encouraged to call the office with any questions or concerns that should arise related to todays visit.   Orders Placed This Encounter  Procedures  . Theophylline level  . ANA w/Reflex if Positive  . Alpha-1-antitrypsin  . Sed Rate (ESR)  . Ambulatory referral to Cardiology    Time spent:25Minutes  Dr Lavera Guise Internal medicine

## 2018-11-29 IMAGING — DX DG CHEST 1V PORT
1 series · 1 of 1 positions shown · non-contrast
Comparison: Chest CT dated 08/19/2016

CLINICAL DATA: 48-year-old female with shortness of breath.

EXAM:
PORTABLE CHEST 1 VIEW

[chest ap]
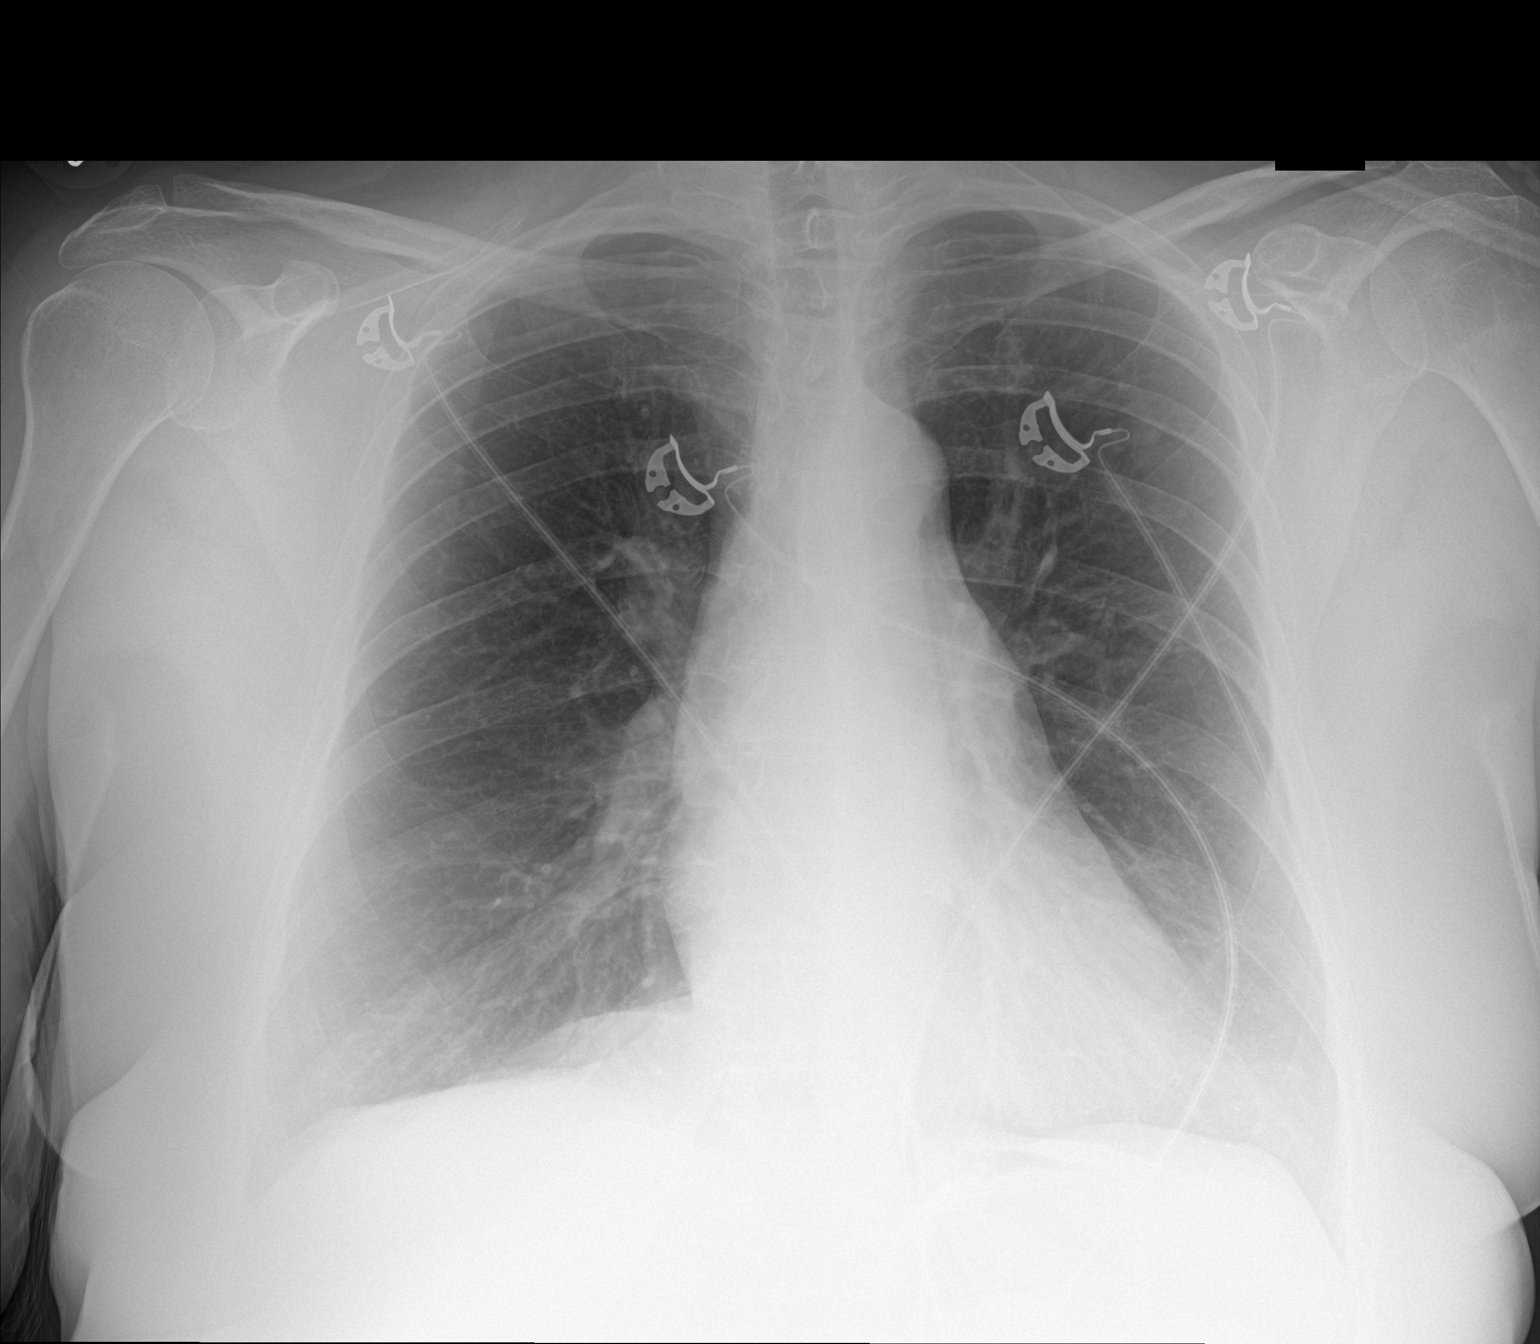

[1 of 1 positions shown; findings below may reference images not displayed]

FINDINGS: The heart size and mediastinal contours are within normal limits.
Both lungs are clear. The visualized skeletal structures are
unremarkable.
IMPRESSION: No active disease.

## 2018-11-30 DIAGNOSIS — Z6835 Body mass index (BMI) 35.0-35.9, adult: Secondary | ICD-10-CM | POA: Diagnosis not present

## 2018-11-30 DIAGNOSIS — E119 Type 2 diabetes mellitus without complications: Secondary | ICD-10-CM | POA: Diagnosis not present

## 2018-11-30 DIAGNOSIS — E782 Mixed hyperlipidemia: Secondary | ICD-10-CM | POA: Diagnosis not present

## 2018-11-30 DIAGNOSIS — J45909 Unspecified asthma, uncomplicated: Secondary | ICD-10-CM | POA: Diagnosis not present

## 2018-11-30 DIAGNOSIS — K219 Gastro-esophageal reflux disease without esophagitis: Secondary | ICD-10-CM | POA: Diagnosis not present

## 2018-11-30 DIAGNOSIS — R0602 Shortness of breath: Secondary | ICD-10-CM | POA: Diagnosis not present

## 2018-11-30 DIAGNOSIS — J449 Chronic obstructive pulmonary disease, unspecified: Secondary | ICD-10-CM | POA: Diagnosis not present

## 2018-11-30 DIAGNOSIS — E6609 Other obesity due to excess calories: Secondary | ICD-10-CM | POA: Diagnosis not present

## 2018-11-30 DIAGNOSIS — I208 Other forms of angina pectoris: Secondary | ICD-10-CM | POA: Diagnosis not present

## 2018-12-04 ENCOUNTER — Other Ambulatory Visit: Payer: Self-pay

## 2018-12-04 MED ORDER — ALBUTEROL SULFATE (2.5 MG/3ML) 0.083% IN NEBU: INHALATION_SOLUTION | RESPIRATORY_TRACT | 0 refills | Status: DC

## 2018-12-07 ENCOUNTER — Other Ambulatory Visit: Payer: Self-pay

## 2018-12-07 MED ORDER — ALBUTEROL SULFATE (2.5 MG/3ML) 0.083% IN NEBU: INHALATION_SOLUTION | RESPIRATORY_TRACT | 0 refills | Status: DC

## 2018-12-18 ENCOUNTER — Other Ambulatory Visit: Payer: Self-pay | Admitting: Nurse Practitioner

## 2018-12-18 ENCOUNTER — Other Ambulatory Visit: Payer: Self-pay

## 2018-12-18 DIAGNOSIS — I10 Essential (primary) hypertension: Secondary | ICD-10-CM | POA: Diagnosis not present

## 2018-12-18 DIAGNOSIS — E559 Vitamin D deficiency, unspecified: Secondary | ICD-10-CM | POA: Diagnosis not present

## 2018-12-18 DIAGNOSIS — E119 Type 2 diabetes mellitus without complications: Secondary | ICD-10-CM | POA: Diagnosis not present

## 2018-12-18 DIAGNOSIS — Z0001 Encounter for general adult medical examination with abnormal findings: Secondary | ICD-10-CM | POA: Diagnosis not present

## 2018-12-18 MED ORDER — MONTELUKAST SODIUM 10 MG PO TABS: 10.0000 mg | ORAL_TABLET | Freq: Every day | ORAL | 1 refills | Status: DC

## 2018-12-19 LAB — URINALYSIS, ROUTINE W REFLEX MICROSCOPIC
Bilirubin, UA: NEGATIVE
Glucose, UA: NEGATIVE
Ketones, UA: NEGATIVE
Nitrite, UA: NEGATIVE
Protein,UA: NEGATIVE
RBC, UA: NEGATIVE
Specific Gravity, UA: 1.01 (ref 1.005–1.030)
Urobilinogen, Ur: 0.2 mg/dL (ref 0.2–1.0)
pH, UA: 7 (ref 5.0–7.5)

## 2018-12-19 LAB — COMPREHENSIVE METABOLIC PANEL
ALT: 20 IU/L (ref 0–32)
AST: 19 IU/L (ref 0–40)
Albumin/Globulin Ratio: 1.8 (ref 1.2–2.2)
Albumin: 4.3 g/dL (ref 3.8–4.9)
Alkaline Phosphatase: 141 IU/L — ABNORMAL HIGH (ref 39–117)
BUN/Creatinine Ratio: 17 (ref 9–23)
BUN: 12 mg/dL (ref 6–24)
Bilirubin Total: 0.4 mg/dL (ref 0.0–1.2)
CO2: 24 mmol/L (ref 20–29)
Calcium: 9.4 mg/dL (ref 8.7–10.2)
Chloride: 104 mmol/L (ref 96–106)
Creatinine, Ser: 0.72 mg/dL (ref 0.57–1.00)
GFR calc Af Amer: 106 mL/min/{1.73_m2} (ref >59)
GFR calc non Af Amer: 92 mL/min/{1.73_m2} (ref >59)
Globulin, Total: 2.4 g/dL (ref 1.5–4.5)
Glucose: 161 mg/dL — ABNORMAL HIGH (ref 65–99)
Potassium: 4 mmol/L (ref 3.5–5.2)
Sodium: 146 mmol/L — ABNORMAL HIGH (ref 134–144)
Total Protein: 6.7 g/dL (ref 6.0–8.5)

## 2018-12-19 LAB — MICROALBUMIN / CREATININE URINE RATIO
Creatinine, Urine: 30.6 mg/dL
Microalb/Creat Ratio: <10 mg/g creat (ref 0–29)
Microalbumin, Urine: <3 ug/mL

## 2018-12-19 LAB — CBC
Hematocrit: 40.8 % (ref 34.0–46.6)
Hemoglobin: 13.8 g/dL (ref 11.1–15.9)
MCH: 30.7 pg (ref 26.6–33.0)
MCHC: 33.8 g/dL (ref 31.5–35.7)
MCV: 91 fL (ref 79–97)
Platelets: 288 10*3/uL (ref 150–450)
RBC: 4.5 x10E6/uL (ref 3.77–5.28)
RDW: 13 % (ref 11.7–15.4)
WBC: 7.9 10*3/uL (ref 3.4–10.8)

## 2018-12-19 LAB — LIPID PANEL W/O CHOL/HDL RATIO
Cholesterol, Total: 188 mg/dL (ref 100–199)
HDL: 77 mg/dL (ref >39)
LDL Chol Calc (NIH): 88 mg/dL (ref 0–99)
Triglycerides: 135 mg/dL (ref 0–149)
VLDL Cholesterol Cal: 23 mg/dL (ref 5–40)

## 2018-12-19 LAB — TSH: TSH: 2.53 u[IU]/mL (ref 0.450–4.500)

## 2018-12-19 LAB — MICROSCOPIC EXAMINATION
Bacteria, UA: NONE SEEN
Casts: NONE SEEN /lpf

## 2018-12-19 LAB — T4, FREE: Free T4: 1.3 ng/dL (ref 0.82–1.77)

## 2018-12-19 LAB — VITAMIN D 25 HYDROXY (VIT D DEFICIENCY, FRACTURES): Vit D, 25-Hydroxy: 32.4 ng/mL (ref 30.0–100.0)

## 2018-12-21 NOTE — Progress Notes (Signed)
Labs good. Discuss with patient at next visit .

## 2018-12-22 DIAGNOSIS — I208 Other forms of angina pectoris: Secondary | ICD-10-CM | POA: Diagnosis not present

## 2018-12-30 DIAGNOSIS — R0602 Shortness of breath: Secondary | ICD-10-CM | POA: Diagnosis not present

## 2018-12-30 DIAGNOSIS — M5416 Radiculopathy, lumbar region: Secondary | ICD-10-CM | POA: Diagnosis not present

## 2018-12-30 DIAGNOSIS — E6609 Other obesity due to excess calories: Secondary | ICD-10-CM | POA: Diagnosis not present

## 2018-12-30 DIAGNOSIS — E119 Type 2 diabetes mellitus without complications: Secondary | ICD-10-CM | POA: Diagnosis not present

## 2018-12-30 DIAGNOSIS — I208 Other forms of angina pectoris: Secondary | ICD-10-CM | POA: Diagnosis not present

## 2018-12-30 DIAGNOSIS — M1712 Unilateral primary osteoarthritis, left knee: Secondary | ICD-10-CM | POA: Diagnosis not present

## 2018-12-30 DIAGNOSIS — J449 Chronic obstructive pulmonary disease, unspecified: Secondary | ICD-10-CM | POA: Diagnosis not present

## 2018-12-30 DIAGNOSIS — M5136 Other intervertebral disc degeneration, lumbar region: Secondary | ICD-10-CM | POA: Diagnosis not present

## 2018-12-30 DIAGNOSIS — E782 Mixed hyperlipidemia: Secondary | ICD-10-CM | POA: Diagnosis not present

## 2018-12-30 DIAGNOSIS — Z6835 Body mass index (BMI) 35.0-35.9, adult: Secondary | ICD-10-CM | POA: Diagnosis not present

## 2018-12-30 DIAGNOSIS — J45909 Unspecified asthma, uncomplicated: Secondary | ICD-10-CM | POA: Diagnosis not present

## 2019-01-07 ENCOUNTER — Other Ambulatory Visit: Payer: Self-pay

## 2019-01-07 ENCOUNTER — Encounter: Payer: Self-pay | Admitting: Nurse Practitioner

## 2019-01-07 ENCOUNTER — Ambulatory Visit (INDEPENDENT_AMBULATORY_CARE_PROVIDER_SITE_OTHER): Payer: PPO | Admitting: Nurse Practitioner

## 2019-01-07 ENCOUNTER — Telehealth: Payer: Self-pay

## 2019-01-07 VITALS — Ht 63.0 in | Wt 205.0 lb

## 2019-01-07 DIAGNOSIS — J4550 Severe persistent asthma, uncomplicated: Secondary | ICD-10-CM

## 2019-01-07 DIAGNOSIS — E1165 Type 2 diabetes mellitus with hyperglycemia: Secondary | ICD-10-CM

## 2019-01-07 DIAGNOSIS — I10 Essential (primary) hypertension: Secondary | ICD-10-CM

## 2019-01-07 DIAGNOSIS — R918 Other nonspecific abnormal finding of lung field: Secondary | ICD-10-CM

## 2019-01-07 NOTE — Telephone Encounter (Signed)
Mailed labs slip  

## 2019-01-07 NOTE — Progress Notes (Signed)
[facility] [place], [place] [address]  Internal MEDICINE  Telephone Visit  Patient Name: Cassie Ross  [id number]  [id number]  Date of Service: 01/23/2019  I connected with the patient at 12:56pm by webcam and verified the patients identity using two identifiers.   I discussed the limitations, risks, security and privacy concerns of performing an evaluation and management service by webcam and the availability of in person appointments. I also discussed with the patient that there may be a patient responsible charge related to the service.  The patient expressed understanding and agrees to proceed.    Chief Complaint  Patient presents with  . Telephone Assessment  . Telephone Screen  . Diabetes  . Medication Management    bad constipation from ozempic   . Hypertension  . Hyperlipidemia    The patient has been contacted via telephone for follow up visit due to concerns for spread of novel coronavirus. States that her blood sugars were doing well while she was taking ozempic. She developed some significant constipation. She did try miralax. She did get good relief from this. She is due to have check of HgbA1c. Will mail her a lab slip to check this along with theophylline levels. Will also check ANA with reflex as she does have generalized joint pain.       Current Medication: Outpatient Encounter Medications as of 01/07/2019  Medication Sig Note  . acetaminophen (TYLENOL) 500 MG tablet Take 1,000 mg by mouth every 8 (eight) hours as needed for moderate pain.    Marland Kitchen albuterol (PROAIR HFA) 108 (90 Base) MCG/ACT inhaler Inhale 2 puffs into the lungs every 4 (four) hours as needed for wheezing or shortness of breath.   Marland Kitchen albuterol (PROVENTIL) (2.5 MG/3ML) 0.083% nebulizer solution USE 1 VIAL IN NEBULIZER EVERY 6 HOURS AS NEEDED   . ALPRAZolam (XANAX) 0.5 MG tablet TAKE 1/2 (ONE-HALF) TABLET BY MOUTH IN THE MORNING AND TAKE 1 TABLET IN THE EVENING.   Marland Kitchen  aspirin EC 81 MG tablet Take 81 mg by mouth daily.   Marland Kitchen atorvastatin (LIPITOR) 10 MG tablet Take 1 tablet (10 mg total) by mouth daily.   . Blood Glucose Monitoring Suppl (ONETOUCH VERIO) w/Device KIT Use as directed diag   . budesonide-formoterol (SYMBICORT) 160-4.5 MCG/ACT inhaler Inhale 1 puff into the lungs 2 (two) times daily.   . Chlorpheniramine-APAP (CORICIDIN) 2-325 MG TABS Take 2 tablets by mouth daily as needed (cold symptoms).    Marland Kitchen diltiazem (CARDIZEM CD) 180 MG 24 hr capsule Take 1 capsule (180 mg total) by mouth 2 (two) times daily.   Marland Kitchen doxycycline (VIBRA-TABS) 100 MG tablet Take 1 tablet (100 mg total) by mouth 2 (two) times daily.   Marland Kitchen Fexofenadine-Pseudoephedrine (ALLEGRA-D 24 HOUR PO) Take by mouth at bedtime.   . fluticasone (FLONASE) 50 MCG/ACT nasal spray Place 1 spray into both nostrils every morning.   . furosemide (LASIX) 80 MG tablet Take 80 mg by mouth 2 (two) times daily. 03/05/2018: MED ON HOLD   . glucose blood (ONETOUCH VERIO) test strip Use as instructed three times daily diag e11.65   . ibuprofen (ADVIL,MOTRIN) 200 MG tablet Take 200 mg by mouth every 8 (eight) hours as needed (pain).    . insulin regular (HUMULIN R) 100 units/mL injection Sliding scale - use as directed QAC per sliding scale instructions. Max daily dose is 25 units in 24 hours. E11.65   . montelukast (SINGULAIR) 10 MG tablet Take 1 tablet (10 mg total)  by mouth at bedtime.   . Multiple Vitamins-Minerals (ONE-A-DAY MENOPAUSE FORMULA PO) Take 1 tablet by mouth daily.   . pantoprazole (PROTONIX) 40 MG tablet Take 40 mg by mouth 2 (two) times daily.    . potassium chloride (K-DUR,KLOR-CON) 10 MEQ tablet Take 1 tablet (10 mEq total) by mouth 2 (two) times daily.   . Semaglutide,0.25 or 0.5MG/DOS, (OZEMPIC, 0.25 OR 0.5 MG/DOSE,) 2 MG/1.5ML SOPN Inject 0.5 mg into the skin once a week.   . theophylline (THEODUR) 300 MG 12 hr tablet Take 1 tablet (300 mg total) by mouth daily.   . [DISCONTINUED] predniSONE  (DELTASONE) 20 MG tablet TAKE AS DIRECTED    No facility-administered encounter medications on file as of 01/07/2019.     Surgical History: Past Surgical History:  Procedure Laterality Date  . cataract surgery    . CESAREAN SECTION    . CHOLECYSTECTOMY    . COLONOSCOPY WITH PROPOFOL N/A 01/06/2018   Procedure: COLONOSCOPY WITH PROPOFOL;  Surgeon: Lollie Sails, MD;  Location: [facility] ENDOSCOPY;  Service: Endoscopy;  Laterality: N/A;  . ESOPHAGEAL DILATION    . ESOPHAGOGASTRODUODENOSCOPY (EGD) WITH PROPOFOL N/A 01/06/2018   Procedure: ESOPHAGOGASTRODUODENOSCOPY (EGD) WITH PROPOFOL;  Surgeon: Lollie Sails, MD;  Location: [facility] ENDOSCOPY;  Service: Endoscopy;  Laterality: N/A;  . HERNIA REPAIR    . hiatial hernia repair    . NASAL SINUS SURGERY      Medical History: Past Medical History:  Diagnosis Date  . Asthma   . CHF (congestive heart failure) ([place])    1991- unknown after asthma attack  . Chicken pox   . COPD (chronic obstructive pulmonary disease) ([place])   . Diabetes ([place])   . DVT (deep venous thrombosis) ([place])   . GERD (gastroesophageal reflux disease)   . Hernia, hiatal   . HLD (hyperlipidemia)   . Hypertension   . Hypertension   . Seasonal allergies     Family History: Family History  Problem Relation Age of Onset  . Hypertension Mother     Social History   Socioeconomic History  . Marital status: Married    Spouse name: Not on file  . Number of children: Not on file  . Years of education: Not on file  . Highest education level: Not on file  Occupational History  . Not on file  Social Needs  . Financial resource strain: Not on file  . Food insecurity    Worry: Not on file    Inability: Not on file  . Transportation needs    Medical: Not on file    Non-medical: Not on file  Tobacco Use  . Smoking status: Never Smoker  . Smokeless tobacco: Never Used  Substance and Sexual Activity  . Alcohol use: Yes    Comment: glass of wine ocassionally    . Drug use: No  . Sexual activity: Not on file  Lifestyle  . Physical activity    Days per week: Not on file    Minutes per session: Not on file  . Stress: Not on file  Relationships  . Social Herbalist on phone: Not on file    Gets together: Not on file    Attends religious service: Not on file    Active member of club or organization: Not on file    Attends meetings of clubs or organizations: Not on file    Relationship status: Not on file  . Intimate partner violence    Fear of current or  ex partner: Not on file    Emotionally abused: Not on file    Physically abused: Not on file    Forced sexual activity: Not on file  Other Topics Concern  . Not on file  Social History Narrative   Lives at home with family. Independent      Review of Systems  Constitutional: Positive for fatigue. Negative for chills and unexpected weight change.  HENT: Negative for congestion, postnasal drip, rhinorrhea, sneezing and sore throat.   Respiratory: Negative for cough, chest tightness, shortness of breath and wheezing.   Cardiovascular: Negative for chest pain and palpitations.  Gastrointestinal: Negative for abdominal pain, constipation, diarrhea, nausea and vomiting.  Endocrine: Negative for cold intolerance, heat intolerance, polydipsia and polyuria.       Blood sugars are elevated recently. Was unable to tolerate ozempic due to side effect of constipation.   Musculoskeletal: Negative for arthralgias, back pain, joint swelling and neck pain.       Generalized joint pain.   Skin: Negative for rash.  Neurological: Negative for dizziness, tremors, numbness and headaches.  Hematological: Negative for adenopathy. Does not bruise/bleed easily.  Psychiatric/Behavioral: Negative for behavioral problems (Depression), sleep disturbance and suicidal ideas. The patient is nervous/anxious.     Today's Vitals   01/07/19 1205  Weight: 205 lb (93 kg)  Height: 5' 3"  (1.6 m)   Body mass  index is 36.31 kg/m.  Observation/Objective:   The patient is alert and oriented. She is pleasant and answers all questions appropriately. Breathing is non-labored. She is in no acute distress at this time.    Assessment/Plan: 1. Uncontrolled type 2 diabetes mellitus with hyperglycemia ([place]) Send lab slip to check HgbA1c. Consider adding farxiga to help lower blood sugars.   2. Essential hypertension Stable. Continue bp medication as prescribed   3. Severe persistent asthma without complication Continue inhalers and respiratory medications as prescribed. Check therophylline level and Alpha-1 for further evaluation. Adjust medication as indicated.   General Counseling: chelsa stout understanding of the findings of today's phone visit and agrees with plan of treatment. I have discussed any further diagnostic evaluation that may be needed or ordered today. We also reviewed her medications today. she has been encouraged to call the office with any questions or concerns that should arise related to todays visit.  Diabetes Counseling:  1. Addition of ACE inh/ ARB'S for nephroprotection. Microalbumin is updated  2. Diabetic foot care, prevention of complications. Podiatry consult 3. Exercise and lose weight.  4. Diabetic eye examination, Diabetic eye exam is updated  5. Monitor blood sugar closlely. nutrition counseling.  6. Sign and symptoms of hypoglycemia including shaking sweating,confusion and headaches.  This patient was seen by Leretha Pol FNP Collaboration with Dr Lavera Guise as a part of collaborative care agreement  Time spent: 33 Minutes    Dr Lavera Guise Internal medicine

## 2019-01-13 DIAGNOSIS — E1165 Type 2 diabetes mellitus with hyperglycemia: Secondary | ICD-10-CM | POA: Diagnosis not present

## 2019-01-13 DIAGNOSIS — R918 Other nonspecific abnormal finding of lung field: Secondary | ICD-10-CM | POA: Diagnosis not present

## 2019-01-14 ENCOUNTER — Other Ambulatory Visit: Payer: Self-pay

## 2019-01-14 LAB — ALPHA-1-ANTITRYPSIN: A-1 Antitrypsin: 115 mg/dL (ref 101–187)

## 2019-01-14 LAB — ANA W/REFLEX IF POSITIVE
Anti JO-1: <0.2 AI (ref 0.0–0.9)
Anti Nuclear Antibody (ANA): POSITIVE — AB
Centromere Ab Screen: <0.2 AI (ref 0.0–0.9)
Chromatin Ab SerPl-aCnc: <0.2 AI (ref 0.0–0.9)
ENA RNP Ab: 1.2 AI — ABNORMAL HIGH (ref 0.0–0.9)
ENA SM Ab Ser-aCnc: <0.2 AI (ref 0.0–0.9)
ENA SSA (RO) Ab: 0.2 AI (ref 0.0–0.9)
ENA SSB (LA) Ab: <0.2 AI (ref 0.0–0.9)
Scleroderma (Scl-70) (ENA) Antibody, IgG: <0.2 AI (ref 0.0–0.9)
dsDNA Ab: <1 IU/mL (ref 0–9)

## 2019-01-14 LAB — THEOPHYLLINE LEVEL: Theophylline, Serum: 3.3 ug/mL — ABNORMAL LOW (ref 10.0–20.0)

## 2019-01-14 LAB — HGB A1C W/O EAG: Hgb A1c MFr Bld: 7.8 % — ABNORMAL HIGH (ref 4.8–5.6)

## 2019-01-14 MED ORDER — PREDNISONE 20 MG PO TABS: ORAL_TABLET | ORAL | 0 refills | Status: DC

## 2019-01-14 NOTE — Progress Notes (Signed)
Farxiga

## 2019-01-23 DIAGNOSIS — J4550 Severe persistent asthma, uncomplicated: Secondary | ICD-10-CM | POA: Insufficient documentation

## 2019-01-29 ENCOUNTER — Other Ambulatory Visit: Payer: Self-pay | Admitting: Adult Health

## 2019-02-03 ENCOUNTER — Other Ambulatory Visit: Payer: Self-pay

## 2019-02-03 MED ORDER — ALBUTEROL SULFATE (2.5 MG/3ML) 0.083% IN NEBU: INHALATION_SOLUTION | RESPIRATORY_TRACT | 0 refills | Status: DC

## 2019-02-11 ENCOUNTER — Other Ambulatory Visit: Payer: Self-pay

## 2019-02-11 MED ORDER — PREDNISONE 20 MG PO TABS: ORAL_TABLET | ORAL | 0 refills | Status: DC

## 2019-02-15 ENCOUNTER — Telehealth: Payer: Self-pay

## 2019-02-15 NOTE — Telephone Encounter (Signed)
CONFIRMED AND SCREENED FOR 02-18-19 OV.

## 2019-02-18 ENCOUNTER — Ambulatory Visit (INDEPENDENT_AMBULATORY_CARE_PROVIDER_SITE_OTHER): Payer: PPO | Admitting: Nurse Practitioner

## 2019-02-18 ENCOUNTER — Other Ambulatory Visit: Payer: Self-pay

## 2019-02-18 ENCOUNTER — Encounter: Payer: Self-pay | Admitting: Nurse Practitioner

## 2019-02-18 VITALS — BP 134/101 | HR 97 | Resp 16 | Ht 63.0 in | Wt 210.0 lb

## 2019-02-18 DIAGNOSIS — E1165 Type 2 diabetes mellitus with hyperglycemia: Secondary | ICD-10-CM

## 2019-02-18 DIAGNOSIS — N61 Mastitis without abscess: Secondary | ICD-10-CM

## 2019-02-18 DIAGNOSIS — J4550 Severe persistent asthma, uncomplicated: Secondary | ICD-10-CM | POA: Diagnosis not present

## 2019-02-18 MED ORDER — THEOPHYLLINE ER 300 MG PO TB12: 300.0000 mg | ORAL_TABLET | Freq: Two times a day (BID) | ORAL | 1 refills | Status: DC

## 2019-02-18 MED ORDER — SULFAMETHOXAZOLE-TRIMETHOPRIM 800-160 MG PO TABS: 1.0000 | ORAL_TABLET | Freq: Two times a day (BID) | ORAL | 0 refills | Status: DC

## 2019-02-18 NOTE — Progress Notes (Signed)
[facility] [place], [place] [address]  Internal MEDICINE  Office Visit Note  Patient Name: Cassie Ross  [id number]  [id number]  Date of Service: 02/21/2019  Chief Complaint  Patient presents with  . Breast Problem    left breast sore, warm to touch, swollen, pain radiates to underam; started 2 weeks ago, last night was bad      The patient is here for acute visit. She has been having intermittent left breast tenderness for past few months. States that left breast gets noticeably larger than the right and becomes very tender, especially at night. She is sometimes able to express discharge from the left nipple. This is a tan color with no odor. She denies fever or chills. She denies headache, nausea, or vomiting.  Reviewed her most recent labs showing low theophylline levels. insurance had changed and she is currently taking regular theophylline 337m daily.  Prior to insurance change, she was taking XR theophylline 3040mdaily.   Pt is here for a sick visit.     Current Medication:  Outpatient Encounter Medications as of 02/18/2019  Medication Sig Note  . acetaminophen (TYLENOL) 500 MG tablet Take 1,000 mg by mouth every 8 (eight) hours as needed for moderate pain.    . Marland Kitchenlbuterol (PROAIR HFA) 108 (90 Base) MCG/ACT inhaler Inhale 2 puffs into the lungs every 4 (four) hours as needed for wheezing or shortness of breath.   . Marland Kitchenlbuterol (PROVENTIL) (2.5 MG/3ML) 0.083% nebulizer solution USE 1 VIAL IN NEBULIZER EVERY 6 HOURS AS NEEDED   . ALPRAZolam (XANAX) 0.5 MG tablet TAKE 1/2 (ONE-HALF) TABLET BY MOUTH IN THE MORNING AND TAKE 1 TABLET IN THE EVENING.   . Marland Kitchenspirin EC 81 MG tablet Take 81 mg by mouth daily.   . Marland Kitchentorvastatin (LIPITOR) 10 MG tablet Take 1 tablet (10 mg total) by mouth daily.   . Blood Glucose Monitoring Suppl (ONETOUCH VERIO) w/Device KIT Use as directed diag   . budesonide-formoterol (SYMBICORT) 160-4.5 MCG/ACT inhaler Inhale 1 puff into  the lungs 2 (two) times daily.   . Chlorpheniramine-APAP (CORICIDIN) 2-325 MG TABS Take 2 tablets by mouth daily as needed (cold symptoms).    . Marland Kitcheniltiazem (CARDIZEM CD) 180 MG 24 hr capsule Take 1 capsule (180 mg total) by mouth 2 (two) times daily.   . Marland Kitchenoxycycline (VIBRA-TABS) 100 MG tablet Take 1 tablet (100 mg total) by mouth 2 (two) times daily.   . Marland Kitchenexofenadine-Pseudoephedrine (ALLEGRA-D 24 HOUR PO) Take by mouth at bedtime.   . fluticasone (FLONASE) 50 MCG/ACT nasal spray Place 1 spray into both nostrils every morning.   . furosemide (LASIX) 80 MG tablet Take 80 mg by mouth 2 (two) times daily. 03/05/2018: MED ON HOLD   . glucose blood (ONETOUCH VERIO) test strip Use as instructed three times daily diag e11.65   . ibuprofen (ADVIL,MOTRIN) 200 MG tablet Take 200 mg by mouth every 8 (eight) hours as needed (pain).    . insulin regular (HUMULIN R) 100 units/mL injection Sliding scale - use as directed QAC per sliding scale instructions. Max daily dose is 25 units in 24 hours. E11.65   . montelukast (SINGULAIR) 10 MG tablet Take 1 tablet (10 mg total) by mouth at bedtime.   . Multiple Vitamins-Minerals (ONE-A-DAY MENOPAUSE FORMULA PO) Take 1 tablet by mouth daily.   . pantoprazole (PROTONIX) 40 MG tablet Take 40 mg by mouth 2 (two) times daily.    . potassium chloride (K-DUR,KLOR-CON) 10 MEQ tablet Take 1  tablet (10 mEq total) by mouth 2 (two) times daily.   . predniSONE (DELTASONE) 20 MG tablet TAKE AS DIRECTED   . Semaglutide,0.25 or 0.5MG/DOS, (OZEMPIC, 0.25 OR 0.5 MG/DOSE,) 2 MG/1.5ML SOPN Inject 0.5 mg into the skin once a week.   . theophylline (THEODUR) 300 MG 12 hr tablet Take 1 tablet (300 mg total) by mouth 2 (two) times daily.   . [DISCONTINUED] theophylline (THEODUR) 300 MG 12 hr tablet Take 1 tablet (300 mg total) by mouth daily.   Marland Kitchen sulfamethoxazole-trimethoprim (BACTRIM DS) 800-160 MG tablet Take 1 tablet by mouth 2 (two) times daily.    No facility-administered encounter  medications on file as of 02/18/2019.      Medical History: Past Medical History:  Diagnosis Date  . Asthma   . CHF (congestive heart failure) ([place])    1991- unknown after asthma attack  . Chicken pox   . COPD (chronic obstructive pulmonary disease) ([place])   . Diabetes ([place])   . DVT (deep venous thrombosis) ([place])   . GERD (gastroesophageal reflux disease)   . Hernia, hiatal   . HLD (hyperlipidemia)   . Hypertension   . Hypertension   . Seasonal allergies      Today's Vitals   02/18/19 1110  BP: (!) 134/101  Pulse: 97  Resp: 16  SpO2: 96%  Weight: 210 lb (95.3 kg)  Height: _0  (1.6 m)   Body mass index is 37.2 kg/m.   Review of Systems  Constitutional: Positive for fatigue. Negative for chills, fever and unexpected weight change.  HENT: Negative for congestion, postnasal drip, rhinorrhea, sneezing and sore throat.   Respiratory: Negative for cough, chest tightness, shortness of breath and wheezing.   Cardiovascular: Negative for chest pain and palpitations.  Gastrointestinal: Negative for abdominal pain, constipation, diarrhea, nausea and vomiting.  Endocrine:       She states that her blood sugars have been doing well.   Genitourinary: Negative for dysuria and frequency.       Intermittent left breast tenderness with swelling. Sometimes she is able to express tan discharge from the nipple.   Musculoskeletal: Positive for arthralgias. Negative for back pain, joint swelling and neck pain.  Skin: Negative for rash.  Neurological: Negative for dizziness, tremors, numbness and headaches.  Hematological: Negative for adenopathy. Does not bruise/bleed easily.  Psychiatric/Behavioral: Negative for behavioral problems (Depression), sleep disturbance and suicidal ideas. The patient is not nervous/anxious.     Physical Exam Vitals and nursing note reviewed.  Constitutional:      General: She is not in acute distress.    Appearance: Normal appearance. She is  well-developed. She is not diaphoretic.  HENT:     Head: Normocephalic and atraumatic.     Mouth/Throat:     Pharynx: No oropharyngeal exudate.  Eyes:     Pupils: Pupils are equal, round, and reactive to light.  Neck:     Thyroid: No thyromegaly.     Vascular: No JVD.     Trachea: No tracheal deviation.  Cardiovascular:     Rate and Rhythm: Normal rate and regular rhythm.     Heart sounds: Normal heart sounds. No murmur. No friction rub. No gallop.   Pulmonary:     Effort: Pulmonary effort is normal. No respiratory distress.     Breath sounds: Normal breath sounds. No wheezing or rales.  Chest:     Chest wall: No tenderness.     Breasts:        Right: Normal. No  swelling, bleeding, inverted nipple, mass, nipple discharge, skin change or tenderness.        Left: Tenderness present. No swelling or mass.     Comments: Currently no left breast swelling and no mass appreciated. The breast is warm and very tender to palpate. There is no discharge from the nipple at this time.  Abdominal:     Palpations: Abdomen is soft.  Musculoskeletal:        General: Normal range of motion.     Cervical back: Normal range of motion and neck supple.  Lymphadenopathy:     Cervical: No cervical adenopathy.     Upper Body:     Right upper body: No axillary adenopathy.     Left upper body: No axillary adenopathy.  Skin:    General: Skin is warm and dry.  Neurological:     Mental Status: She is alert and oriented to person, place, and time.     Cranial Nerves: No cranial nerve deficit.  Psychiatric:        Behavior: Behavior normal.        Thought Content: Thought content normal.        Judgment: Judgment normal.   Assessment/Plan: 1. Mastitis in female Suspect infection. Start bactrim DS bid for next 10 days. Will see back to reevaluate. Will need to get screening mammogram and diagnostic images of left breast when symptoms improve.  - sulfamethoxazole-trimethoprim (BACTRIM DS) 800-160 MG  tablet; Take 1 tablet by mouth 2 (two) times daily.  Dispense: 20 tablet; Refill: 0  2. Severe persistent asthma without complication Increase theophyline 337m to twice daily. Will recheck theophyline levels after her next visit.  - theophylline (THEODUR) 300 MG 12 hr tablet; Take 1 tablet (300 mg total) by mouth 2 (two) times daily.  Dispense: 180 tablet; Refill: 1  3. Uncontrolled type 2 diabetes mellitus with hyperglycemia ([place] Patient now on ozempic once weekly at 0.552m blood sugars improving. Will continue to monitor closely.   General Counseling: Demikalah skylesnderstanding of the findings of todays visit and agrees with plan of treatment. I have discussed any further diagnostic evaluation that may be needed or ordered today. We also reviewed her medications today. she has been encouraged to call the office with any questions or concerns that should arise related to todays visit.    Counseling:   This patient was seen by HeLa Plantith Dr FoLavera Guises a part of collaborative care agreement  Meds ordered this encounter  Medications  . sulfamethoxazole-trimethoprim (BACTRIM DS) 800-160 MG tablet    Sig: Take 1 tablet by mouth 2 (two) times daily.    Dispense:  20 tablet    Refill:  0    Order Specific Question:   Supervising Provider    Answer:   KHLavera Guise1[[id number]]. theophylline (THEODUR) 300 MG 12 hr tablet    Sig: Take 1 tablet (300 mg total) by mouth 2 (two) times daily.    Dispense:  180 tablet    Refill:  1    Please consider 90 day supplies to promote better adherence    Order Specific Question:   Supervising Provider    Answer:   KHLavera Guise1[[id number]]  Time spent: 25 Minutes

## 2019-02-20 NOTE — Progress Notes (Signed)
She was started on ozempic at the time of her visit. Doing well.

## 2019-02-21 DIAGNOSIS — N61 Mastitis without abscess: Secondary | ICD-10-CM | POA: Insufficient documentation

## 2019-02-26 ENCOUNTER — Telehealth: Payer: Self-pay

## 2019-02-26 NOTE — Telephone Encounter (Signed)
CONFIRMED AND SCREENED FOR 03-02-19 OV. 

## 2019-03-02 ENCOUNTER — Ambulatory Visit (INDEPENDENT_AMBULATORY_CARE_PROVIDER_SITE_OTHER): Payer: PPO | Admitting: Nurse Practitioner

## 2019-03-02 ENCOUNTER — Encounter: Payer: Self-pay | Admitting: Nurse Practitioner

## 2019-03-02 ENCOUNTER — Other Ambulatory Visit: Payer: Self-pay

## 2019-03-02 VITALS — BP 155/84 | HR 80 | Temp 97.3°F | Resp 16 | Ht 63.0 in | Wt 205.8 lb

## 2019-03-02 DIAGNOSIS — K219 Gastro-esophageal reflux disease without esophagitis: Secondary | ICD-10-CM | POA: Diagnosis not present

## 2019-03-02 DIAGNOSIS — Z5181 Encounter for therapeutic drug level monitoring: Secondary | ICD-10-CM | POA: Insufficient documentation

## 2019-03-02 DIAGNOSIS — J4550 Severe persistent asthma, uncomplicated: Secondary | ICD-10-CM

## 2019-03-02 DIAGNOSIS — Z1231 Encounter for screening mammogram for malignant neoplasm of breast: Secondary | ICD-10-CM | POA: Diagnosis not present

## 2019-03-02 DIAGNOSIS — N61 Mastitis without abscess: Secondary | ICD-10-CM | POA: Diagnosis not present

## 2019-03-02 NOTE — Progress Notes (Signed)
[facility] [place], [place] [address]  Internal MEDICINE  Office Visit Note  Patient Name: Cassie Ross  [id number]  [id number]  Date of Service: 03/02/2019  No chief complaint on file.   The patient is here for follow up. Last week, she had been having intermittent left breast tenderness.Left breast had been getting noticeably larger than the right and becomes very tender, especially at night. She was sometimes able to express discharge from the left nipple. This was a tan color with no odor. She was treated with Bactrim Ds bid for 10 days. She finished antibiotic yesterday. States that symptoms have resolved. She is overdue for screening mammogram. Would like to get this done after the first of the year. Also increased the dose of her theophylline at her last visit. She is now taking this twice daily. She states that breathing, even at night, seems to be gradually improving. She will need to have a recheck of theophylline level prior to her next visit.       Current Medication: Outpatient Encounter Medications as of 03/02/2019  Medication Sig Note  . acetaminophen (TYLENOL) 500 MG tablet Take 1,000 mg by mouth every 8 (eight) hours as needed for moderate pain.    Marland Kitchen albuterol (PROAIR HFA) 108 (90 Base) MCG/ACT inhaler Inhale 2 puffs into the lungs every 4 (four) hours as needed for wheezing or shortness of breath.   Marland Kitchen albuterol (PROVENTIL) (2.5 MG/3ML) 0.083% nebulizer solution USE 1 VIAL IN NEBULIZER EVERY 6 HOURS AS NEEDED   . ALPRAZolam (XANAX) 0.5 MG tablet TAKE 1/2 (ONE-HALF) TABLET BY MOUTH IN THE MORNING AND TAKE 1 TABLET IN THE EVENING.   Marland Kitchen aspirin EC 81 MG tablet Take 81 mg by mouth daily.   Marland Kitchen atorvastatin (LIPITOR) 10 MG tablet Take 1 tablet (10 mg total) by mouth daily.   . Blood Glucose Monitoring Suppl (ONETOUCH VERIO) w/Device KIT Use as directed diag   . budesonide-formoterol (SYMBICORT) 160-4.5 MCG/ACT inhaler Inhale 1 puff into the lungs 2  (two) times daily.   . Chlorpheniramine-APAP (CORICIDIN) 2-325 MG TABS Take 2 tablets by mouth daily as needed (cold symptoms).    Marland Kitchen diltiazem (CARDIZEM CD) 180 MG 24 hr capsule Take 1 capsule (180 mg total) by mouth 2 (two) times daily.   Marland Kitchen doxycycline (VIBRA-TABS) 100 MG tablet Take 1 tablet (100 mg total) by mouth 2 (two) times daily.   Marland Kitchen Fexofenadine-Pseudoephedrine (ALLEGRA-D 24 HOUR PO) Take by mouth at bedtime.   . fluticasone (FLONASE) 50 MCG/ACT nasal spray Place 1 spray into both nostrils every morning.   . furosemide (LASIX) 80 MG tablet Take 80 mg by mouth 2 (two) times daily. 03/05/2018: MED ON HOLD   . glucose blood (ONETOUCH VERIO) test strip Use as instructed three times daily diag e11.65   . ibuprofen (ADVIL,MOTRIN) 200 MG tablet Take 200 mg by mouth every 8 (eight) hours as needed (pain).    . insulin regular (HUMULIN R) 100 units/mL injection Sliding scale - use as directed QAC per sliding scale instructions. Max daily dose is 25 units in 24 hours. E11.65   . montelukast (SINGULAIR) 10 MG tablet Take 1 tablet (10 mg total) by mouth at bedtime.   . Multiple Vitamins-Minerals (ONE-A-DAY MENOPAUSE FORMULA PO) Take 1 tablet by mouth daily.   . pantoprazole (PROTONIX) 40 MG tablet Take 40 mg by mouth 2 (two) times daily.    . potassium chloride (K-DUR,KLOR-CON) 10 MEQ tablet Take 1 tablet (10 mEq total)  by mouth 2 (two) times daily.   . predniSONE (DELTASONE) 20 MG tablet TAKE AS DIRECTED   . Semaglutide,0.25 or 0.5MG/DOS, (OZEMPIC, 0.25 OR 0.5 MG/DOSE,) 2 MG/1.5ML SOPN Inject 0.5 mg into the skin once a week.   . sulfamethoxazole-trimethoprim (BACTRIM DS) 800-160 MG tablet Take 1 tablet by mouth 2 (two) times daily.   . theophylline (THEODUR) 300 MG 12 hr tablet Take 1 tablet (300 mg total) by mouth 2 (two) times daily.    No facility-administered encounter medications on file as of 03/02/2019.    Surgical History: Past Surgical History:  Procedure Laterality Date  . cataract  surgery    . CESAREAN SECTION    . CHOLECYSTECTOMY    . COLONOSCOPY WITH PROPOFOL N/A 01/06/2018   Procedure: COLONOSCOPY WITH PROPOFOL;  Surgeon: Lollie Sails, MD;  Location: [facility] ENDOSCOPY;  Service: Endoscopy;  Laterality: N/A;  . ESOPHAGEAL DILATION    . ESOPHAGOGASTRODUODENOSCOPY (EGD) WITH PROPOFOL N/A 01/06/2018   Procedure: ESOPHAGOGASTRODUODENOSCOPY (EGD) WITH PROPOFOL;  Surgeon: Lollie Sails, MD;  Location: [facility] ENDOSCOPY;  Service: Endoscopy;  Laterality: N/A;  . HERNIA REPAIR    . hiatial hernia repair    . NASAL SINUS SURGERY      Medical History: Past Medical History:  Diagnosis Date  . Asthma   . CHF (congestive heart failure) ([place])    1991- unknown after asthma attack  . Chicken pox   . COPD (chronic obstructive pulmonary disease) ([place])   . Diabetes ([place])   . DVT (deep venous thrombosis) ([place])   . GERD (gastroesophageal reflux disease)   . Hernia, hiatal   . HLD (hyperlipidemia)   . Hypertension   . Hypertension   . Seasonal allergies     Family History: Family History  Problem Relation Age of Onset  . Hypertension Mother     Social History   Socioeconomic History  . Marital status: Married    Spouse name: Not on file  . Number of children: Not on file  . Years of education: Not on file  . Highest education level: Not on file  Occupational History  . Not on file  Tobacco Use  . Smoking status: Never Smoker  . Smokeless tobacco: Never Used  Substance and Sexual Activity  . Alcohol use: Yes    Comment: glass of wine ocassionally   . Drug use: No  . Sexual activity: Not on file  Other Topics Concern  . Not on file  Social History Narrative   Lives at home with family. Independent   Social Determinants of Health   Financial Resource Strain:   . Difficulty of Paying Living Expenses: Not on file  Food Insecurity:   . Worried About Charity fundraiser in the Last Year: Not on file  . Ran Out of Food in the Last Year: Not on file   Transportation Needs:   . Lack of Transportation (Medical): Not on file  . Lack of Transportation (Non-Medical): Not on file  Physical Activity:   . Days of Exercise per Week: Not on file  . Minutes of Exercise per Session: Not on file  Stress:   . Feeling of Stress : Not on file  Social Connections:   . Frequency of Communication with Friends and Family: Not on file  . Frequency of Social Gatherings with Friends and Family: Not on file  . Attends Religious Services: Not on file  . Active Member of Clubs or Organizations: Not on file  . Attends Club or  Organization Meetings: Not on file  . Marital Status: Not on file  Intimate Partner Violence:   . Fear of Current or Ex-Partner: Not on file  . Emotionally Abused: Not on file  . Physically Abused: Not on file  . Sexually Abused: Not on file      Review of Systems  Constitutional: Positive for fatigue. Negative for chills, fever and unexpected weight change.  HENT: Negative for congestion, postnasal drip, rhinorrhea, sneezing and sore throat.   Respiratory: Negative for cough, chest tightness, shortness of breath and wheezing.   Cardiovascular: Negative for chest pain and palpitations.  Gastrointestinal: Negative for abdominal pain, constipation, diarrhea, nausea and vomiting.  Genitourinary: Negative for dysuria and frequency.       Intermittent left breast tenderness with swelling. Sometimes she is able to express tan discharge from the nipple. This has resolved since her most recent visit, following treatment with antibiotic.   Musculoskeletal: Positive for arthralgias. Negative for back pain, joint swelling and neck pain.  Skin: Negative for rash.  Neurological: Negative for dizziness, tremors, numbness and headaches.  Hematological: Negative for adenopathy. Does not bruise/bleed easily.  Psychiatric/Behavioral: Negative for behavioral problems (Depression), sleep disturbance and suicidal ideas. The patient is not  nervous/anxious.     Today's Vitals   03/02/19 1104  BP: (!) 155/84  Pulse: 80  Resp: 16  Temp: (!) 97.3 F (36.3 C)  SpO2: 97%  Weight: 205 lb 12.8 oz (93.4 kg)  Height: 5' 3" (1.6 m)   Body mass index is 36.46 kg/m.  Physical Exam Vitals and nursing note reviewed.  Constitutional:      General: She is not in acute distress.    Appearance: Normal appearance. She is well-developed. She is not diaphoretic.  HENT:     Head: Normocephalic and atraumatic.     Mouth/Throat:     Pharynx: No oropharyngeal exudate.  Eyes:     Pupils: Pupils are equal, round, and reactive to light.  Neck:     Thyroid: No thyromegaly.     Vascular: No JVD.     Trachea: No tracheal deviation.  Cardiovascular:     Rate and Rhythm: Normal rate and regular rhythm.     Heart sounds: Normal heart sounds. No murmur. No friction rub. No gallop.   Pulmonary:     Effort: Pulmonary effort is normal. No respiratory distress.     Breath sounds: Normal breath sounds. No wheezing or rales.  Chest:     Chest wall: No tenderness.     Breasts:        Right: No swelling, bleeding, inverted nipple, mass, nipple discharge, skin change or tenderness.        Left: No swelling, bleeding, inverted nipple, mass, nipple discharge, skin change or tenderness.  Abdominal:     General: Bowel sounds are normal.     Palpations: Abdomen is soft.  Musculoskeletal:        General: Normal range of motion.     Cervical back: Normal range of motion and neck supple.  Lymphadenopathy:     Cervical: No cervical adenopathy.     Upper Body:     Right upper body: No axillary adenopathy.     Left upper body: No axillary adenopathy.  Skin:    General: Skin is warm and dry.  Neurological:     Mental Status: She is alert and oriented to person, place, and time.     Cranial Nerves: No cranial nerve deficit.  Psychiatric:  Behavior: Behavior normal.        Thought Content: Thought content normal.        Judgment: Judgment  normal.     Assessment/Plan: 1. Mastitis in female Resolved. Will monitor  2. Encounter for screening mammogram for malignant neoplasm of breast Will get screening mammogram after the new year 2021. - scrrening mammo; Future  3. Severe persistent asthma without complication Improving with higher dose of theophylline. conitnue inhalers and respiratory medication as prescribed .  4. Encounter for therapeutic drug level monitoring Recheck theophylline level prior to next visit. Adjust dosing as indicated.   5. Gastroesophageal reflux disease without esophagitis Stable.   General Counseling: karmel patricelli understanding of the findings of todays visit and agrees with plan of treatment. I have discussed any further diagnostic evaluation that may be needed or ordered today. We also reviewed her medications today. she has been encouraged to call the office with any questions or concerns that should arise related to todays visit.  This patient was seen by Leretha Pol FNP Collaboration with Dr Lavera Guise as a part of collaborative care agreement  Orders Placed This Encounter  Procedures  . scrrening mammo      Time spent: 25 Minutes      Dr Lavera Guise Internal medicine

## 2019-03-29 ENCOUNTER — Other Ambulatory Visit: Payer: Self-pay

## 2019-03-30 ENCOUNTER — Ambulatory Visit
Admission: RE | Admit: 2019-03-30 | Discharge: 2019-03-30 | Disposition: A | Discharge status: Home / Self Care | Payer: PPO | Source: Ambulatory Visit | Attending: Nurse Practitioner | Admitting: Nurse Practitioner

## 2019-03-30 ENCOUNTER — Other Ambulatory Visit: Payer: Self-pay | Admitting: Nurse Practitioner

## 2019-03-30 DIAGNOSIS — Z1231 Encounter for screening mammogram for malignant neoplasm of breast: Secondary | ICD-10-CM | POA: Insufficient documentation

## 2019-03-30 DIAGNOSIS — F411 Generalized anxiety disorder: Secondary | ICD-10-CM

## 2019-03-30 MED ORDER — ALPRAZOLAM 0.5 MG PO TABS: ORAL_TABLET | ORAL | 1 refills | Status: DC

## 2019-03-30 NOTE — Progress Notes (Signed)
Approved refill request for alprazlam per pharamcy request

## 2019-03-31 NOTE — Progress Notes (Signed)
Negative mammogram

## 2019-04-07 ENCOUNTER — Other Ambulatory Visit: Payer: Self-pay | Admitting: Nurse Practitioner

## 2019-04-07 DIAGNOSIS — Z5181 Encounter for therapeutic drug level monitoring: Secondary | ICD-10-CM | POA: Diagnosis not present

## 2019-04-08 LAB — THEOPHYLLINE LEVEL: Theophylline, Serum: 16.1 ug/mL (ref 10.0–20.0)

## 2019-04-08 NOTE — Progress Notes (Signed)
Theophylline now at therapeutic level.

## 2019-04-09 ENCOUNTER — Telehealth: Payer: Self-pay

## 2019-04-09 NOTE — Telephone Encounter (Signed)
Confirmed 04-13-19 ov as virtual. 

## 2019-04-13 ENCOUNTER — Other Ambulatory Visit: Payer: Self-pay

## 2019-04-13 ENCOUNTER — Ambulatory Visit (INDEPENDENT_AMBULATORY_CARE_PROVIDER_SITE_OTHER): Payer: PPO | Admitting: Nurse Practitioner

## 2019-04-13 ENCOUNTER — Encounter: Payer: Self-pay | Admitting: Nurse Practitioner

## 2019-04-13 VITALS — BP 135/85 | Temp 97.4°F | Resp 16 | Ht 63.0 in | Wt 207.0 lb

## 2019-04-13 DIAGNOSIS — E1165 Type 2 diabetes mellitus with hyperglycemia: Secondary | ICD-10-CM | POA: Diagnosis not present

## 2019-04-13 DIAGNOSIS — Z79899 Other long term (current) drug therapy: Secondary | ICD-10-CM

## 2019-04-13 DIAGNOSIS — J4550 Severe persistent asthma, uncomplicated: Secondary | ICD-10-CM

## 2019-04-13 DIAGNOSIS — I10 Essential (primary) hypertension: Secondary | ICD-10-CM | POA: Diagnosis not present

## 2019-04-13 DIAGNOSIS — F411 Generalized anxiety disorder: Secondary | ICD-10-CM

## 2019-04-13 LAB — POCT GLYCOSYLATED HEMOGLOBIN (HGB A1C): Hemoglobin A1C: 7.3 % — AB (ref 4.0–5.6)

## 2019-04-13 LAB — POCT URINE DRUG SCREEN
POC Amphetamine UR: NOT DETECTED
POC BENZODIAZEPINES UR: POSITIVE — AB
POC Barbiturate UR: NOT DETECTED
POC Cocaine UR: NOT DETECTED
POC Ecstasy UR: NOT DETECTED
POC Marijuana UR: NOT DETECTED
POC Methadone UR: NOT DETECTED
POC Methamphetamine UR: NOT DETECTED
POC Opiate Ur: NOT DETECTED
POC Oxycodone UR: NOT DETECTED
POC PHENCYCLIDINE UR: NOT DETECTED
POC TRICYCLICS UR: NOT DETECTED

## 2019-04-13 NOTE — Progress Notes (Signed)
[facility] [place], [place] [address]  Internal MEDICINE  Office Visit Note  Patient Name: Cassie Ross  [id number]  [id number]  Date of Service: 04/14/2019  No chief complaint on file.   The patient is here for follow up visit. Blood sugars are improved. Was started on ozempic. Taking 0.66m weekly. Tolerating it well. HgbA1c improved from 7.8 to 7.3 today. Did have mammogram. Was negative. Report did show fibroglandular density which was not abnormal. She should follow up with screening mammogram in one year. She has no new concerns or complaints today.       Current Medication: Outpatient Encounter Medications as of 04/13/2019  Medication Sig Note  . acetaminophen (TYLENOL) 500 MG tablet Take 1,000 mg by mouth every 8 (eight) hours as needed for moderate pain.    .Marland Kitchenalbuterol (PROAIR HFA) 108 (90 Base) MCG/ACT inhaler Inhale 2 puffs into the lungs every 4 (four) hours as needed for wheezing or shortness of breath.   .Marland Kitchenalbuterol (PROVENTIL) (2.5 MG/3ML) 0.083% nebulizer solution USE 1 VIAL IN NEBULIZER EVERY 6 HOURS AS NEEDED   . ALPRAZolam (XANAX) 0.5 MG tablet TAKE 1/2 (ONE-HALF) TABLET BY MOUTH IN THE MORNING AND TAKE 1 TABLET IN THE EVENING.   .Marland Kitchenaspirin EC 81 MG tablet Take 81 mg by mouth daily.   .Marland Kitchenatorvastatin (LIPITOR) 10 MG tablet Take 1 tablet (10 mg total) by mouth daily.   . Blood Glucose Monitoring Suppl (ONETOUCH VERIO) w/Device KIT Use as directed diag   . budesonide-formoterol (SYMBICORT) 160-4.5 MCG/ACT inhaler Inhale 1 puff into the lungs 2 (two) times daily.   . Chlorpheniramine-APAP (CORICIDIN) 2-325 MG TABS Take 2 tablets by mouth daily as needed (cold symptoms).    .Marland Kitchendiltiazem (CARDIZEM CD) 180 MG 24 hr capsule Take 1 capsule (180 mg total) by mouth 2 (two) times daily.   .Marland Kitchendoxycycline (VIBRA-TABS) 100 MG tablet Take 1 tablet (100 mg total) by mouth 2 (two) times daily.   .Marland KitchenFexofenadine-Pseudoephedrine (ALLEGRA-D 24 HOUR PO) Take by  mouth at bedtime.   . fluticasone (FLONASE) 50 MCG/ACT nasal spray Place 1 spray into both nostrils every morning.   . furosemide (LASIX) 80 MG tablet Take 80 mg by mouth 2 (two) times daily. 03/05/2018: MED ON HOLD   . glucose blood (ONETOUCH VERIO) test strip Use as instructed three times daily diag e11.65   . ibuprofen (ADVIL,MOTRIN) 200 MG tablet Take 200 mg by mouth every 8 (eight) hours as needed (pain).    . insulin regular (HUMULIN R) 100 units/mL injection Sliding scale - use as directed QAC per sliding scale instructions. Max daily dose is 25 units in 24 hours. E11.65   . montelukast (SINGULAIR) 10 MG tablet Take 1 tablet (10 mg total) by mouth at bedtime.   . Multiple Vitamins-Minerals (ONE-A-DAY MENOPAUSE FORMULA PO) Take 1 tablet by mouth daily.   . pantoprazole (PROTONIX) 40 MG tablet Take 40 mg by mouth 2 (two) times daily.    . potassium chloride (K-DUR,KLOR-CON) 10 MEQ tablet Take 1 tablet (10 mEq total) by mouth 2 (two) times daily.   . predniSONE (DELTASONE) 20 MG tablet TAKE AS DIRECTED   . Semaglutide,0.25 or 0.5MG/DOS, (OZEMPIC, 0.25 OR 0.5 MG/DOSE,) 2 MG/1.5ML SOPN Inject 0.5 mg into the skin once a week.   . theophylline (THEODUR) 300 MG 12 hr tablet Take 1 tablet (300 mg total) by mouth 2 (two) times daily.   . [DISCONTINUED] sulfamethoxazole-trimethoprim (BACTRIM DS) 800-160 MG tablet Take  1 tablet by mouth 2 (two) times daily.    No facility-administered encounter medications on file as of 04/13/2019.    Surgical History: Past Surgical History:  Procedure Laterality Date  . cataract surgery    . CESAREAN SECTION    . CHOLECYSTECTOMY    . COLONOSCOPY WITH PROPOFOL N/A 01/06/2018   Procedure: COLONOSCOPY WITH PROPOFOL;  Surgeon: Lollie Sails, MD;  Location: [facility] ENDOSCOPY;  Service: Endoscopy;  Laterality: N/A;  . ESOPHAGEAL DILATION    . ESOPHAGOGASTRODUODENOSCOPY (EGD) WITH PROPOFOL N/A 01/06/2018   Procedure: ESOPHAGOGASTRODUODENOSCOPY (EGD) WITH PROPOFOL;   Surgeon: Lollie Sails, MD;  Location: [facility] ENDOSCOPY;  Service: Endoscopy;  Laterality: N/A;  . HERNIA REPAIR    . hiatial hernia repair    . NASAL SINUS SURGERY      Medical History: Past Medical History:  Diagnosis Date  . Asthma   . CHF (congestive heart failure) ([place])    1991- unknown after asthma attack  . Chicken pox   . COPD (chronic obstructive pulmonary disease) ([place])   . Diabetes ([place])   . DVT (deep venous thrombosis) ([place])   . GERD (gastroesophageal reflux disease)   . Hernia, hiatal   . HLD (hyperlipidemia)   . Hypertension   . Hypertension   . Seasonal allergies     Family History: Family History  Problem Relation Age of Onset  . Hypertension Mother     Social History   Socioeconomic History  . Marital status: Married    Spouse name: Not on file  . Number of children: Not on file  . Years of education: Not on file  . Highest education level: Not on file  Occupational History  . Not on file  Tobacco Use  . Smoking status: Never Smoker  . Smokeless tobacco: Never Used  Substance and Sexual Activity  . Alcohol use: Yes    Comment: glass of wine ocassionally   . Drug use: No  . Sexual activity: Not on file  Other Topics Concern  . Not on file  Social History Narrative   Lives at home with family. Independent   Social Determinants of Health   Financial Resource Strain:   . Difficulty of Paying Living Expenses: Not on file  Food Insecurity:   . Worried About Charity fundraiser in the Last Year: Not on file  . Ran Out of Food in the Last Year: Not on file  Transportation Needs:   . Lack of Transportation (Medical): Not on file  . Lack of Transportation (Non-Medical): Not on file  Physical Activity:   . Days of Exercise per Week: Not on file  . Minutes of Exercise per Session: Not on file  Stress:   . Feeling of Stress : Not on file  Social Connections:   . Frequency of Communication with Friends and Family: Not on file  . Frequency of  Social Gatherings with Friends and Family: Not on file  . Attends Religious Services: Not on file  . Active Member of Clubs or Organizations: Not on file  . Attends Archivist Meetings: Not on file  . Marital Status: Not on file  Intimate Partner Violence:   . Fear of Current or Ex-Partner: Not on file  . Emotionally Abused: Not on file  . Physically Abused: Not on file  . Sexually Abused: Not on file      Review of Systems  Constitutional: Negative for chills, fatigue and unexpected weight change.  HENT: Negative for congestion, postnasal  drip, rhinorrhea, sneezing and sore throat.   Respiratory: Positive for cough and wheezing. Negative for chest tightness and shortness of breath.        Has intermittent dry cough. Sometimes wheezing, especially at night. theophylline was increased to twice daily and she has felt better since dose increased.  Cardiovascular: Negative for chest pain and palpitations.  Gastrointestinal: Negative for abdominal pain, constipation, diarrhea, nausea and vomiting.  Endocrine: Negative for cold intolerance, heat intolerance, polydipsia and polyuria.       Patient states that her blood sugars have been doing better.  Musculoskeletal: Negative for arthralgias, back pain, joint swelling and neck pain.  Skin: Negative for rash.  Allergic/Immunologic: Positive for environmental allergies.  Neurological: Negative for dizziness, tremors, numbness and headaches.  Hematological: Negative for adenopathy. Does not bruise/bleed easily.  Psychiatric/Behavioral: Negative for behavioral problems (Depression), sleep disturbance and suicidal ideas. The patient is nervous/anxious.     Today's Vitals   04/13/19 1201  BP: 135/85  Resp: 16  Temp: (!) 97.4 F (36.3 C)  SpO2: 94%  Weight: 207 lb (93.9 kg)  Height: 5' 3"  (1.6 m)   Body mass index is 36.67 kg/m.  Physical Exam Vitals and nursing note reviewed.  Constitutional:      General: She is not in  acute distress.    Appearance: Normal appearance. She is well-developed. She is not diaphoretic.  HENT:     Head: Normocephalic and atraumatic.     Nose: Nose normal.     Mouth/Throat:     Pharynx: No oropharyngeal exudate.  Eyes:     Pupils: Pupils are equal, round, and reactive to light.  Neck:     Thyroid: No thyromegaly.     Vascular: No carotid bruit or JVD.     Trachea: No tracheal deviation.  Cardiovascular:     Rate and Rhythm: Normal rate and regular rhythm.     Heart sounds: Normal heart sounds. No murmur. No friction rub. No gallop.   Pulmonary:     Effort: Pulmonary effort is normal. No respiratory distress.     Breath sounds: Normal breath sounds. No wheezing or rales.     Comments: Mild, dry, intermittent cough present throughout her visit.  Chest:     Chest wall: No tenderness.  Abdominal:     Palpations: Abdomen is soft.  Musculoskeletal:        General: Normal range of motion.     Cervical back: Normal range of motion and neck supple.  Lymphadenopathy:     Cervical: No cervical adenopathy.  Skin:    General: Skin is warm and dry.  Neurological:     Mental Status: She is alert and oriented to person, place, and time.     Cranial Nerves: No cranial nerve deficit.  Psychiatric:        Behavior: Behavior normal.        Thought Content: Thought content normal.        Judgment: Judgment normal.    Assessment/Plan:  1. Uncontrolled type 2 diabetes mellitus with hyperglycemia (HCC) - POCT HgB A1C 7.3 today, down from 7.8 at the most recent check. Continue ozempic 0.17m weekly. Use sliding scale insulin when needed for mealtime coverage when taking prednisone.   2. Severe persistent asthma without complication Improved control. Continue inhalers and respiratory medications as prescribed.   3. Essential hypertension Stable. contineu bp medication as prescribed   4. Generalized anxiety disorder May continue to take alprazolam 0.549mas needed and as prescribed  5. Encounter for long-term (current) use of medications UDS appropriately positive for BZO today.  - POCT Urine Drug Screen   General Counseling: Deniece verbalizes understanding of the findings of todays visit and agrees with plan of treatment. I have discussed any further diagnostic evaluation that may be needed or ordered today. We also reviewed her medications today. she has been encouraged to call the office with any questions or concerns that should arise related to todays visit.  Diabetes Counseling:  1. Addition of ACE inh/ ARB'S for nephroprotection. Microalbumin is updated  2. Diabetic foot care, prevention of complications. Podiatry consult 3. Exercise and lose weight.  4. Diabetic eye examination, Diabetic eye exam is updated  5. Monitor blood sugar closlely. nutrition counseling.  6. Sign and symptoms of hypoglycemia including shaking sweating,confusion and headaches.  This patient was seen by Leretha Pol FNP Collaboration with Dr Lavera Guise as a part of collaborative care agreement  Orders Placed This Encounter  Procedures  . POCT HgB A1C  . POCT Urine Drug Screen     Total time spent: 30 Minutes   Time spent includes review of chart, medications, test results, and follow up plan with the patient.      Dr Lavera Guise Internal medicine

## 2019-04-14 DIAGNOSIS — Z79899 Other long term (current) drug therapy: Secondary | ICD-10-CM | POA: Insufficient documentation

## 2019-04-28 ENCOUNTER — Other Ambulatory Visit: Payer: Self-pay

## 2019-04-28 MED ORDER — ALBUTEROL SULFATE (2.5 MG/3ML) 0.083% IN NEBU: INHALATION_SOLUTION | RESPIRATORY_TRACT | 0 refills | Status: DC

## 2019-05-04 DIAGNOSIS — L814 Other melanin hyperpigmentation: Secondary | ICD-10-CM | POA: Diagnosis not present

## 2019-05-04 DIAGNOSIS — D2339 Other benign neoplasm of skin of other parts of face: Secondary | ICD-10-CM | POA: Diagnosis not present

## 2019-05-04 DIAGNOSIS — L821 Other seborrheic keratosis: Secondary | ICD-10-CM | POA: Diagnosis not present

## 2019-05-18 ENCOUNTER — Other Ambulatory Visit: Payer: Self-pay

## 2019-05-18 MED ORDER — ATORVASTATIN CALCIUM 10 MG PO TABS: 10.0000 mg | ORAL_TABLET | Freq: Every day | ORAL | 2 refills | Status: DC

## 2019-05-20 ENCOUNTER — Other Ambulatory Visit: Payer: Self-pay

## 2019-05-20 MED ORDER — MONTELUKAST SODIUM 10 MG PO TABS: 10.0000 mg | ORAL_TABLET | Freq: Every day | ORAL | 1 refills | Status: DC

## 2019-07-07 ENCOUNTER — Other Ambulatory Visit: Payer: Self-pay | Admitting: Adult Health

## 2019-07-08 ENCOUNTER — Telehealth: Payer: Self-pay

## 2019-07-08 NOTE — Telephone Encounter (Signed)
Lmom to confirm and screen for 07-12-19 ov.

## 2019-07-09 ENCOUNTER — Telehealth: Payer: Self-pay

## 2019-07-09 NOTE — Telephone Encounter (Signed)
Confirmed appointment on 07/13/2019 and screened for covid. klh

## 2019-07-12 ENCOUNTER — Other Ambulatory Visit: Payer: Self-pay

## 2019-07-12 ENCOUNTER — Ambulatory Visit (INDEPENDENT_AMBULATORY_CARE_PROVIDER_SITE_OTHER): Payer: PPO | Admitting: Nurse Practitioner

## 2019-07-12 ENCOUNTER — Encounter: Payer: Self-pay | Admitting: Nurse Practitioner

## 2019-07-12 VITALS — BP 146/82 | HR 92 | Temp 97.4°F | Resp 16 | Ht 63.0 in | Wt 203.6 lb

## 2019-07-12 DIAGNOSIS — K219 Gastro-esophageal reflux disease without esophagitis: Secondary | ICD-10-CM

## 2019-07-12 DIAGNOSIS — Z794 Long term (current) use of insulin: Secondary | ICD-10-CM

## 2019-07-12 DIAGNOSIS — J4550 Severe persistent asthma, uncomplicated: Secondary | ICD-10-CM

## 2019-07-12 DIAGNOSIS — I10 Essential (primary) hypertension: Secondary | ICD-10-CM

## 2019-07-12 DIAGNOSIS — E1165 Type 2 diabetes mellitus with hyperglycemia: Secondary | ICD-10-CM

## 2019-07-12 LAB — POCT GLYCOSYLATED HEMOGLOBIN (HGB A1C): Hemoglobin A1C: 7.9 % — AB (ref 4.0–5.6)

## 2019-07-12 NOTE — Progress Notes (Signed)
[facility] [place], [place] [address]  Internal MEDICINE  Office Visit Note  Patient Name: Cassie Ross  [id number]  [id number]  Date of Service: 07/25/2019  Chief Complaint  Patient presents with  . Diabetes  . Hypertension  . Hyperlipidemia  . Gastroesophageal Reflux  . Asthma  . COPD  . Allergies    The patient is here for follow up visit. She has really been having trouble with allergies. Cause her to severe congestion with cough. Has had a few rounds of prednisone. Causing blood sugars to be elevated. Her HgbA1c is 7.9 today. She does take ozempic 0.72m weekly. When on prednisone, she does take sliding scale insulin for mealtime coverage. Blood pressure is well managed, overall.       Current Medication: Outpatient Encounter Medications as of 07/12/2019  Medication Sig Note  . acetaminophen (TYLENOL) 500 MG tablet Take 1,000 mg by mouth every 8 (eight) hours as needed for moderate pain.    .Marland Kitchenalbuterol (PROAIR HFA) 108 (90 Base) MCG/ACT inhaler Inhale 2 puffs into the lungs every 4 (four) hours as needed for wheezing or shortness of breath.   .Marland Kitchenalbuterol (PROVENTIL) (2.5 MG/3ML) 0.083% nebulizer solution USE 1 VIAL IN NEBULIZER EVERY 6 HOURS AS NEEDED   . ALPRAZolam (XANAX) 0.5 MG tablet TAKE 1/2 (ONE-HALF) TABLET BY MOUTH IN THE MORNING AND TAKE 1 TABLET IN THE EVENING.   .Marland Kitchenaspirin EC 81 MG tablet Take 81 mg by mouth daily.   .Marland Kitchenatorvastatin (LIPITOR) 10 MG tablet Take 1 tablet (10 mg total) by mouth daily.   . Blood Glucose Monitoring Suppl (ONETOUCH VERIO) w/Device KIT Use as directed diag   . budesonide-formoterol (SYMBICORT) 160-4.5 MCG/ACT inhaler Inhale 1 puff into the lungs 2 (two) times daily.   . Chlorpheniramine-APAP (CORICIDIN) 2-325 MG TABS Take 2 tablets by mouth daily as needed (cold symptoms).    .Marland Kitchendiltiazem (CARDIZEM CD) 180 MG 24 hr capsule Take 1 capsule by mouth twice daily   . doxycycline (VIBRA-TABS) 100 MG tablet Take 1  tablet (100 mg total) by mouth 2 (two) times daily.   .Marland KitchenFexofenadine-Pseudoephedrine (ALLEGRA-D 24 HOUR PO) Take by mouth at bedtime.   . fluticasone (FLONASE) 50 MCG/ACT nasal spray Place 1 spray into both nostrils every morning.   . furosemide (LASIX) 80 MG tablet Take 80 mg by mouth 2 (two) times daily. 03/05/2018: MED ON HOLD   . glucose blood (ONETOUCH VERIO) test strip Use as instructed three times daily diag e11.65   . ibuprofen (ADVIL,MOTRIN) 200 MG tablet Take 200 mg by mouth every 8 (eight) hours as needed (pain).    . insulin regular (HUMULIN R) 100 units/mL injection Sliding scale - use as directed QAC per sliding scale instructions. Max daily dose is 25 units in 24 hours. E11.65   . montelukast (SINGULAIR) 10 MG tablet Take 1 tablet (10 mg total) by mouth at bedtime.   . Multiple Vitamins-Minerals (ONE-A-DAY MENOPAUSE FORMULA PO) Take 1 tablet by mouth daily.   . pantoprazole (PROTONIX) 40 MG tablet Take 40 mg by mouth 2 (two) times daily.    . potassium chloride (K-DUR,KLOR-CON) 10 MEQ tablet Take 1 tablet (10 mEq total) by mouth 2 (two) times daily.   . predniSONE (DELTASONE) 20 MG tablet TAKE AS DIRECTED   . Semaglutide,0.25 or 0.5MG/DOS, (OZEMPIC, 0.25 OR 0.5 MG/DOSE,) 2 MG/1.5ML SOPN Inject 0.5 mg into the skin once a week.   . theophylline (THEODUR) 300 MG 12 hr tablet  Take 1 tablet (300 mg total) by mouth 2 (two) times daily.    No facility-administered encounter medications on file as of 07/12/2019.    Surgical History: Past Surgical History:  Procedure Laterality Date  . cataract surgery    . CESAREAN SECTION    . CHOLECYSTECTOMY    . COLONOSCOPY WITH PROPOFOL N/A 01/06/2018   Procedure: COLONOSCOPY WITH PROPOFOL;  Surgeon: Lollie Sails, MD;  Location: [facility] ENDOSCOPY;  Service: Endoscopy;  Laterality: N/A;  . ESOPHAGEAL DILATION    . ESOPHAGOGASTRODUODENOSCOPY (EGD) WITH PROPOFOL N/A 01/06/2018   Procedure: ESOPHAGOGASTRODUODENOSCOPY (EGD) WITH PROPOFOL;   Surgeon: Lollie Sails, MD;  Location: [facility] ENDOSCOPY;  Service: Endoscopy;  Laterality: N/A;  . HERNIA REPAIR    . hiatial hernia repair    . NASAL SINUS SURGERY      Medical History: Past Medical History:  Diagnosis Date  . Asthma   . CHF (congestive heart failure) ([place])    1991- unknown after asthma attack  . Chicken pox   . COPD (chronic obstructive pulmonary disease) ([place])   . Diabetes ([place])   . DVT (deep venous thrombosis) ([place])   . GERD (gastroesophageal reflux disease)   . Hernia, hiatal   . HLD (hyperlipidemia)   . Hypertension   . Hypertension   . Seasonal allergies     Family History: Family History  Problem Relation Age of Onset  . Hypertension Mother     Social History   Socioeconomic History  . Marital status: Married    Spouse name: Not on file  . Number of children: Not on file  . Years of education: Not on file  . Highest education level: Not on file  Occupational History  . Not on file  Tobacco Use  . Smoking status: Never Smoker  . Smokeless tobacco: Never Used  Substance and Sexual Activity  . Alcohol use: Yes    Comment: glass of wine ocassionally   . Drug use: No  . Sexual activity: Not on file  Other Topics Concern  . Not on file  Social History Narrative   Lives at home with family. Independent   Social Determinants of Health   Financial Resource Strain:   . Difficulty of Paying Living Expenses:   Food Insecurity:   . Worried About Charity fundraiser in the Last Year:   . Arboriculturist in the Last Year:   Transportation Needs:   . Film/video editor (Medical):   Marland Kitchen Lack of Transportation (Non-Medical):   Physical Activity:   . Days of Exercise per Week:   . Minutes of Exercise per Session:   Stress:   . Feeling of Stress :   Social Connections:   . Frequency of Communication with Friends and Family:   . Frequency of Social Gatherings with Friends and Family:   . Attends Religious Services:   . Active Member of  Clubs or Organizations:   . Attends Archivist Meetings:   Marland Kitchen Marital Status:   Intimate Partner Violence:   . Fear of Current or Ex-Partner:   . Emotionally Abused:   Marland Kitchen Physically Abused:   . Sexually Abused:       Review of Systems  Constitutional: Negative for chills, fatigue and unexpected weight change.  HENT: Negative for congestion, postnasal drip, rhinorrhea, sneezing and sore throat.   Respiratory: Positive for cough and wheezing. Negative for chest tightness and shortness of breath.        Well managed, overall.  Cardiovascular: Negative for chest pain and palpitations.  Gastrointestinal: Negative for abdominal pain, constipation, diarrhea, nausea and vomiting.  Endocrine: Negative for cold intolerance, heat intolerance, polydipsia and polyuria.       Blood sugars have been a bit elevated recently as she has been on a few rounds of prednisone due to flares of COPD.   Musculoskeletal: Negative for arthralgias, back pain, joint swelling and neck pain.  Skin: Negative for rash.  Allergic/Immunologic: Positive for environmental allergies.  Neurological: Negative for dizziness, tremors, numbness and headaches.  Hematological: Negative for adenopathy. Does not bruise/bleed easily.  Psychiatric/Behavioral: Negative for behavioral problems (Depression), sleep disturbance and suicidal ideas. The patient is nervous/anxious.     Today's Vitals   07/12/19 1116  BP: (!) 146/82  Pulse: 92  Resp: 16  Temp: (!) 97.4 F (36.3 C)  SpO2: 98%  Weight: 203 lb 9.6 oz (92.4 kg)  Height: 5' 3"  (1.6 m)   Body mass index is 36.07 kg/m.  Physical Exam Vitals and nursing note reviewed.  Constitutional:      General: She is not in acute distress.    Appearance: Normal appearance. She is well-developed. She is not diaphoretic.  HENT:     Head: Normocephalic and atraumatic.     Nose: Congestion present.     Mouth/Throat:     Pharynx: No oropharyngeal exudate.  Eyes:      Pupils: Pupils are equal, round, and reactive to light.  Neck:     Thyroid: No thyromegaly.     Vascular: No carotid bruit or JVD.     Trachea: No tracheal deviation.  Cardiovascular:     Rate and Rhythm: Normal rate and regular rhythm.     Heart sounds: Normal heart sounds. No murmur. No friction rub. No gallop.   Pulmonary:     Effort: Pulmonary effort is normal. No respiratory distress.     Breath sounds: Normal breath sounds. No wheezing or rales.     Comments: There are expiratory wheezes in apices of both lungs.  Chest:     Chest wall: No tenderness.  Abdominal:     Palpations: Abdomen is soft.  Musculoskeletal:        General: Normal range of motion.     Cervical back: Normal range of motion and neck supple.  Lymphadenopathy:     Cervical: No cervical adenopathy.  Skin:    General: Skin is warm and dry.  Neurological:     Mental Status: She is alert and oriented to person, place, and time.     Cranial Nerves: No cranial nerve deficit.  Psychiatric:        Behavior: Behavior normal.        Thought Content: Thought content normal.        Judgment: Judgment normal.   Assessment/Plan: 1. Type 2 diabetes mellitus with hyperglycemia, with long-term current use of insulin (HCC) - POCT HgB A1C 7.9 today. Elevated from last check which was 7.3. will continue ozempic weekly. Patient should continue to take sliding scale, mealtime coverage as needed for elevated blood sugars.   2. Severe persistent asthma without complication Currently stable. Continue inhalers and respiratory medications as prescribed.   3. Essential hypertension Stable. Continue bp medication as prescribed   4. Gastroesophageal reflux disease without esophagitis Continue pantoprazole as prescribed .  General Counseling: brittani purdum understanding of the findings of todays visit and agrees with plan of treatment. I have discussed any further diagnostic evaluation that may be needed or ordered today.  We  also reviewed her medications today. she has been encouraged to call the office with any questions or concerns that should arise related to todays visit.  Diabetes Counseling:  1. Addition of ACE inh/ ARB'S for nephroprotection. Microalbumin is updated  2. Diabetic foot care, prevention of complications. Podiatry consult 3. Exercise and lose weight.  4. Diabetic eye examination, Diabetic eye exam is updated  5. Monitor blood sugar closlely. nutrition counseling.  6. Sign and symptoms of hypoglycemia including shaking sweating,confusion and headaches.  This patient was seen by Leretha Pol FNP Collaboration with Dr Lavera Guise as a part of collaborative care agreement  Orders Placed This Encounter  Procedures  . POCT HgB A1C     Total time spent: 30 Minutes   Time spent includes review of chart, medications, test results, and follow up plan with the patient.      Dr Lavera Guise Internal medicine

## 2019-07-29 ENCOUNTER — Other Ambulatory Visit: Payer: Self-pay

## 2019-07-29 DIAGNOSIS — E876 Hypokalemia: Secondary | ICD-10-CM

## 2019-07-29 MED ORDER — POTASSIUM CHLORIDE CRYS ER 10 MEQ PO TBCR: 10.0000 meq | EXTENDED_RELEASE_TABLET | Freq: Two times a day (BID) | ORAL | 5 refills | Status: DC

## 2019-09-06 ENCOUNTER — Other Ambulatory Visit: Payer: Self-pay | Admitting: Adult Health

## 2019-09-06 ENCOUNTER — Other Ambulatory Visit: Payer: Self-pay

## 2019-09-06 DIAGNOSIS — F411 Generalized anxiety disorder: Secondary | ICD-10-CM

## 2019-09-06 DIAGNOSIS — J4550 Severe persistent asthma, uncomplicated: Secondary | ICD-10-CM

## 2019-09-06 MED ORDER — ALPRAZOLAM 0.5 MG PO TABS: ORAL_TABLET | ORAL | 1 refills | Status: DC

## 2019-09-06 MED ORDER — THEOPHYLLINE ER 300 MG PO TB12: 300.0000 mg | ORAL_TABLET | Freq: Two times a day (BID) | ORAL | 1 refills | Status: DC

## 2019-10-04 ENCOUNTER — Other Ambulatory Visit: Payer: Self-pay

## 2019-10-04 ENCOUNTER — Other Ambulatory Visit: Payer: Self-pay | Admitting: Adult Health

## 2019-10-04 MED ORDER — DILTIAZEM HCL ER COATED BEADS 180 MG PO CP24: 180.0000 mg | ORAL_CAPSULE | Freq: Two times a day (BID) | ORAL | 1 refills | Status: DC

## 2019-10-07 ENCOUNTER — Telehealth: Payer: Self-pay

## 2019-10-07 NOTE — Telephone Encounter (Signed)
Lmom to confirm and screen for 10-11-19 ov. 

## 2019-10-07 NOTE — Telephone Encounter (Signed)
Confirmed and screened for 10-11-19 ov. °

## 2019-10-11 ENCOUNTER — Ambulatory Visit (INDEPENDENT_AMBULATORY_CARE_PROVIDER_SITE_OTHER): Payer: PPO | Admitting: Hospice and Palliative Medicine

## 2019-10-11 ENCOUNTER — Encounter: Payer: Self-pay | Admitting: Nurse Practitioner

## 2019-10-11 ENCOUNTER — Ambulatory Visit
Admission: RE | Admit: 2019-10-11 | Discharge: 2019-10-11 | Disposition: A | Discharge status: Home / Self Care | Payer: PPO | Attending: Internal Medicine | Admitting: Internal Medicine

## 2019-10-11 ENCOUNTER — Other Ambulatory Visit: Payer: Self-pay

## 2019-10-11 ENCOUNTER — Ambulatory Visit
Admission: RE | Admit: 2019-10-11 | Discharge: 2019-10-11 | Disposition: A | Discharge status: Home / Self Care | Payer: PPO | Source: Ambulatory Visit | Attending: Internal Medicine | Admitting: Internal Medicine

## 2019-10-11 VITALS — BP 139/89 | HR 88 | Temp 97.6°F | Resp 16 | Ht 63.0 in | Wt 203.0 lb

## 2019-10-11 DIAGNOSIS — Z0001 Encounter for general adult medical examination with abnormal findings: Secondary | ICD-10-CM

## 2019-10-11 DIAGNOSIS — R062 Wheezing: Secondary | ICD-10-CM | POA: Diagnosis not present

## 2019-10-11 DIAGNOSIS — Z794 Long term (current) use of insulin: Secondary | ICD-10-CM | POA: Diagnosis not present

## 2019-10-11 DIAGNOSIS — R0602 Shortness of breath: Secondary | ICD-10-CM | POA: Diagnosis not present

## 2019-10-11 DIAGNOSIS — E1165 Type 2 diabetes mellitus with hyperglycemia: Secondary | ICD-10-CM | POA: Diagnosis not present

## 2019-10-11 DIAGNOSIS — R300 Dysuria: Secondary | ICD-10-CM

## 2019-10-11 DIAGNOSIS — R0600 Dyspnea, unspecified: Secondary | ICD-10-CM | POA: Diagnosis not present

## 2019-10-11 LAB — POCT GLYCOSYLATED HEMOGLOBIN (HGB A1C): Hemoglobin A1C: 8.7 % — AB (ref 4.0–5.6)

## 2019-10-11 MED ORDER — DAPAGLIFLOZIN PROPANEDIOL 5 MG PO TABS: 5.0000 mg | ORAL_TABLET | Freq: Every day | ORAL | 2 refills | Status: DC

## 2019-10-11 MED ORDER — OZEMPIC (1 MG/DOSE) 2 MG/1.5ML ~~LOC~~ SOPN: 1.0000 mg | PEN_INJECTOR | SUBCUTANEOUS | 3 refills | Status: DC

## 2019-10-11 MED ORDER — METHYLPREDNISOLONE ACETATE 80 MG/ML IJ SUSP
80.0000 mg | Freq: Once | INTRAMUSCULAR | Status: AC
Start: 2019-10-11 — End: 2019-10-11
  Administered 2019-10-11: 80 mg via INTRAMUSCULAR

## 2019-10-11 MED ORDER — AZITHROMYCIN 250 MG PO TABS: ORAL_TABLET | ORAL | 0 refills | Status: DC

## 2019-10-11 NOTE — Progress Notes (Signed)
[facility] [place], [place] [address]  Internal MEDICINE  Office Visit Note  Patient Name: Cassie Ross  [id number]  [id number]  Date of Service: 10/11/2019  Chief Complaint  Patient presents with  . Medicare Wellness  . Diabetes  . Gastroesophageal Reflux  . Hyperlipidemia  . Hypertension  . Quality Metric Gaps    tetnaus     HPI Pt is here for routine health maintenance examination.  Today she complains of a flare of her asthma. Just completed a short prednisone taper last week and does not feel as though her symptoms have improved or resolved. Struggles with her asthma in the heat of the summer and wearing a mask for COVID-19 seems to trigger her flares. She is using her nebulizer treatment 3 times a day most days. Continues to use her Symbicort daily as well as albuterol inhaler as needed. Denies fever. A1C today is 8.7 which is more elevated from last check. Reports compliance with Ozempic as well as sliding scale insulin. Understands that with steroids her blood glucose levels will be elevated. Reports that lately even when not on steroids her fasting AM glucose readings are in 200's and her PM glucose readings are 300's.  She is scheduled on August 25th for  Colonoscopy. Advised that her breathing will need to be better controlled before procedure. Up to date on Mammograms  Current Medication: Outpatient Encounter Medications as of 10/11/2019  Medication Sig Note  . acetaminophen (TYLENOL) 500 MG tablet Take 1,000 mg by mouth every 8 (eight) hours as needed for moderate pain.    Marland Kitchen albuterol (PROAIR HFA) 108 (90 Base) MCG/ACT inhaler Inhale 2 puffs into the lungs every 4 (four) hours as needed for wheezing or shortness of breath.   Marland Kitchen albuterol (PROVENTIL) (2.5 MG/3ML) 0.083% nebulizer solution USE 1 VIAL IN NEBULIZER EVERY 6 HOURS AS NEEDED   . ALPRAZolam (XANAX) 0.5 MG tablet TAKE 1/2 (ONE-HALF) TABLET BY MOUTH IN THE MORNING AND TAKE 1 TABLET  IN THE EVENING.   Marland Kitchen aspirin EC 81 MG tablet Take 81 mg by mouth daily.   Marland Kitchen atorvastatin (LIPITOR) 10 MG tablet Take 1 tablet (10 mg total) by mouth daily.   . Blood Glucose Monitoring Suppl (ONETOUCH VERIO) w/Device KIT Use as directed diag   . budesonide-formoterol (SYMBICORT) 160-4.5 MCG/ACT inhaler Inhale 1 puff into the lungs 2 (two) times daily.   . Chlorpheniramine-APAP (CORICIDIN) 2-325 MG TABS Take 2 tablets by mouth daily as needed (cold symptoms).    Marland Kitchen diltiazem (CARDIZEM CD) 180 MG 24 hr capsule Take 1 capsule (180 mg total) by mouth 2 (two) times daily.   Marland Kitchen doxycycline (VIBRA-TABS) 100 MG tablet Take 1 tablet (100 mg total) by mouth 2 (two) times daily.   Marland Kitchen Fexofenadine-Pseudoephedrine (ALLEGRA-D 24 HOUR PO) Take by mouth at bedtime.   . fluticasone (FLONASE) 50 MCG/ACT nasal spray Place 1 spray into both nostrils every morning.   . furosemide (LASIX) 80 MG tablet Take 80 mg by mouth 2 (two) times daily. 03/05/2018: MED ON HOLD   . glucose blood (ONETOUCH VERIO) test strip Use as instructed three times daily diag e11.65   . ibuprofen (ADVIL,MOTRIN) 200 MG tablet Take 200 mg by mouth every 8 (eight) hours as needed (pain).    . insulin regular (HUMULIN R) 100 units/mL injection Sliding scale - use as directed QAC per sliding scale instructions. Max daily dose is 25 units in 24 hours. E11.65   . montelukast (SINGULAIR) 10 MG  tablet Take 1 tablet (10 mg total) by mouth at bedtime.   . Multiple Vitamins-Minerals (ONE-A-DAY MENOPAUSE FORMULA PO) Take 1 tablet by mouth daily.   . pantoprazole (PROTONIX) 40 MG tablet Take 40 mg by mouth 2 (two) times daily.    . potassium chloride (KLOR-CON) 10 MEQ tablet Take 1 tablet (10 mEq total) by mouth 2 (two) times daily.   . predniSONE (DELTASONE) 20 MG tablet TAKE AS DIRECTED   . theophylline (THEODUR) 300 MG 12 hr tablet Take 1 tablet (300 mg total) by mouth 2 (two) times daily.   . [DISCONTINUED] Semaglutide,0.25 or 0.5MG/DOS, (OZEMPIC, 0.25 OR  0.5 MG/DOSE,) 2 MG/1.5ML SOPN Inject 0.5 mg into the skin once a week.   Marland Kitchen azithromycin (ZITHROMAX) 250 MG tablet Take one tab a day for 10 days for uri   . dapagliflozin propanediol (FARXIGA) 5 MG TABS tablet Take 1 tablet (5 mg total) by mouth daily before breakfast. For dm   . Semaglutide, 1 MG/DOSE, (OZEMPIC, 1 MG/DOSE,) 2 MG/1.5ML SOPN Inject 0.75 mLs (1 mg total) into the skin once a week.   . [EXPIRED] methylPREDNISolone acetate (DEPO-MEDROL) injection 80 mg     No facility-administered encounter medications on file as of 10/11/2019.    Surgical History: Past Surgical History:  Procedure Laterality Date  . cataract surgery    . CESAREAN SECTION    . CHOLECYSTECTOMY    . COLONOSCOPY WITH PROPOFOL N/A 01/06/2018   Procedure: COLONOSCOPY WITH PROPOFOL;  Surgeon: Lollie Sails, MD;  Location: [facility] ENDOSCOPY;  Service: Endoscopy;  Laterality: N/A;  . ESOPHAGEAL DILATION    . ESOPHAGOGASTRODUODENOSCOPY (EGD) WITH PROPOFOL N/A 01/06/2018   Procedure: ESOPHAGOGASTRODUODENOSCOPY (EGD) WITH PROPOFOL;  Surgeon: Lollie Sails, MD;  Location: [facility] ENDOSCOPY;  Service: Endoscopy;  Laterality: N/A;  . HERNIA REPAIR    . hiatial hernia repair    . NASAL SINUS SURGERY      Medical History: Past Medical History:  Diagnosis Date  . Asthma   . CHF (congestive heart failure) ([place])    1991- unknown after asthma attack  . Chicken pox   . COPD (chronic obstructive pulmonary disease) ([place])   . Diabetes ([place])   . DVT (deep venous thrombosis) ([place])   . GERD (gastroesophageal reflux disease)   . Hernia, hiatal   . HLD (hyperlipidemia)   . Hypertension   . Hypertension   . Seasonal allergies     Family History: Family History  Problem Relation Age of Onset  . Hypertension Mother       Review of Systems  Constitutional: Negative for chills, diaphoresis and fatigue.  HENT: Negative for ear pain, postnasal drip and sinus pressure.   Eyes: Negative for photophobia, discharge,  redness, itching and visual disturbance.  Respiratory: Positive for chest tightness, shortness of breath and wheezing. Negative for cough.   Cardiovascular: Negative for chest pain, palpitations and leg swelling.  Gastrointestinal: Negative for abdominal pain, constipation, diarrhea, nausea and vomiting.  Genitourinary: Negative for dysuria and flank pain.  Musculoskeletal: Negative for arthralgias, back pain, gait problem and neck pain.  Skin: Negative for color change.  Allergic/Immunologic: Negative for environmental allergies and food allergies.  Neurological: Negative for dizziness and headaches.  Hematological: Does not bruise/bleed easily.  Psychiatric/Behavioral: Negative for agitation, behavioral problems (depression) and hallucinations.     Vital Signs: BP 139/89   Pulse 88   Temp 97.6 F (36.4 C)   Resp 16   Ht 5' 3" (1.6 m)   Wt 203 lb (  92.1 kg)   SpO2 96%   BMI 35.96 kg/m    Physical Exam Constitutional:      General: She is not in acute distress.    Appearance: She is well-developed. She is not diaphoretic.  HENT:     Head: Normocephalic and atraumatic.     Mouth/Throat:     Pharynx: No oropharyngeal exudate.  Eyes:     Pupils: Pupils are equal, round, and reactive to light.  Neck:     Thyroid: No thyromegaly.     Vascular: No JVD.     Trachea: No tracheal deviation.  Cardiovascular:     Rate and Rhythm: Normal rate and regular rhythm.     Heart sounds: Normal heart sounds. No murmur heard.  No friction rub. No gallop.   Pulmonary:     Effort: Pulmonary effort is normal. No respiratory distress.     Breath sounds: Wheezing present. No rales.  Chest:     Chest wall: No tenderness.  Abdominal:     General: Bowel sounds are normal.     Palpations: Abdomen is soft.  Musculoskeletal:        General: Normal range of motion.     Cervical back: Normal range of motion and neck supple.  Lymphadenopathy:     Cervical: No cervical adenopathy.  Skin:     General: Skin is warm and dry.  Neurological:     Mental Status: She is alert and oriented to person, place, and time.     Cranial Nerves: No cranial nerve deficit.  Psychiatric:        Behavior: Behavior normal.        Thought Content: Thought content normal.        Judgment: Judgment normal.      LABS: Recent Results (from the past 2160 hour(s))  POCT HgB A1C     Status: Abnormal   Collection Time: 10/11/19 11:51 AM  Result Value Ref Range   Hemoglobin A1C 8.7 (A) 4.0 - 5.6 %   HbA1c POC (<> result, manual entry)     HbA1c, POC (prediabetic range)     HbA1c, POC (controlled diabetic range)      Assessment/Plan: 1. Encounter for general adult medical examination with abnormal findings Up to date on PHM.  2. Type 2 diabetes mellitus with hyperglycemia, with long-term current use of insulin (HCC) Elevated A1C today at 8.7. Reports compliance with current medications. Reports blood glucose levels have been elevated lately even when not taking prednisone. Will increase Ozempic does to 1 mg and add Farxiga 5 mg, continue also with sliding scale insulin. Will continue to monitor glucose levels as well as A1C. - POCT HgB A1C - Semaglutide, 1 MG/DOSE, (OZEMPIC, 1 MG/DOSE,) 2 MG/1.5ML SOPN; Inject 0.75 mLs (1 mg total) into the skin once a week.  Dispense: 1.5 mL; Refill: 3 - dapagliflozin propanediol (FARXIGA) 5 MG TABS tablet; Take 1 tablet (5 mg total) by mouth daily before breakfast. For dm  Dispense: 30 tablet; Refill: 2 - Ambulatory referral to Ophthalmology  3. Wheezing Recently completed a course of PO prednisone and symptoms have not improved. Will obtain chest x-ray and start antibiotics. - DG Chest 2 View; Future - Spirometry with Graph - methylPREDNISolone acetate (DEPO-MEDROL) injection 80 mg - azithromycin (ZITHROMAX) 250 MG tablet; Take one tab a day for 10 days for uri  Dispense: 10 tablet; Refill: 0  4. SOB (shortness of breath) - Spirometry with Graph  5.  Dysuria - UA/M w/rflx Culture, Routine    General Counseling: elda dunkerson understanding of the findings of todays visit and agrees with plan of treatment. I have discussed any further diagnostic evaluation that may be needed or ordered today. We also reviewed her medications today. she has been encouraged to call the office with any questions or concerns that should arise related to todays visit.   Orders Placed This Encounter  Procedures  . DG Chest 2 View  . UA/M w/rflx Culture, Routine  . Ambulatory referral to Ophthalmology  . POCT HgB A1C  . Spirometry with Graph    Meds ordered this encounter  Medications  . Semaglutide, 1 MG/DOSE, (OZEMPIC, 1 MG/DOSE,) 2 MG/1.5ML SOPN    Sig: Inject 0.75 mLs (1 mg total) into the skin once a week.    Dispense:  1.5 mL    Refill:  3  . dapagliflozin propanediol (FARXIGA) 5 MG TABS tablet    Sig: Take 1 tablet (5 mg total) by mouth daily before breakfast. For dm    Dispense:  30 tablet    Refill:  2  . methylPREDNISolone acetate (DEPO-MEDROL) injection 80 mg  . azithromycin (ZITHROMAX) 250 MG tablet    Sig: Take one tab a day for 10 days for uri    Dispense:  10 tablet    Refill:  0    Total time spent: 30 Minutes  This patient was seen by Theodoro Grist, AGNP-C in collaboration with Dr. Lavera Guise as part of a collaborative care agreement.  Time spent includes review of chart, medications, test results, and follow up plan with the patient.   Theodoro Grist, AGNP-C Internal Medicine

## 2019-10-12 ENCOUNTER — Ambulatory Visit (INDEPENDENT_AMBULATORY_CARE_PROVIDER_SITE_OTHER): Payer: PPO | Admitting: Adult Health

## 2019-10-12 ENCOUNTER — Encounter: Payer: Self-pay | Admitting: Adult Health

## 2019-10-12 VITALS — BP 123/78 | HR 88 | Temp 97.5°F | Resp 16 | Ht 63.0 in | Wt 202.0 lb

## 2019-10-12 DIAGNOSIS — R0602 Shortness of breath: Secondary | ICD-10-CM | POA: Diagnosis not present

## 2019-10-12 DIAGNOSIS — I10 Essential (primary) hypertension: Secondary | ICD-10-CM | POA: Diagnosis not present

## 2019-10-12 DIAGNOSIS — J4521 Mild intermittent asthma with (acute) exacerbation: Secondary | ICD-10-CM

## 2019-10-12 MED ORDER — IPRATROPIUM-ALBUTEROL 0.5-2.5 (3) MG/3ML IN SOLN: 3.0000 mL | RESPIRATORY_TRACT | 0 refills | Status: DC | PRN

## 2019-10-12 MED ORDER — METHYLPREDNISOLONE ACETATE 80 MG/ML IJ SUSP: 80.0000 mg | Freq: Once | INTRAMUSCULAR | Status: DC

## 2019-10-12 MED ORDER — IPRATROPIUM-ALBUTEROL 0.5-2.5 (3) MG/3ML IN SOLN: 3.0000 mL | Freq: Once | RESPIRATORY_TRACT | Status: DC

## 2019-10-12 MED ORDER — IPRATROPIUM-ALBUTEROL 0.5-2.5 (3) MG/3ML IN SOLN
3.0000 mL | Freq: Once | RESPIRATORY_TRACT | Status: AC
  Administered 2019-10-12: 3 mL via RESPIRATORY_TRACT

## 2019-10-12 MED ORDER — METHYLPREDNISOLONE ACETATE 80 MG/ML IJ SUSP
80.0000 mg | Freq: Once | INTRAMUSCULAR | Status: AC
  Administered 2019-10-12: 60 mg via INTRAMUSCULAR

## 2019-10-12 NOTE — Progress Notes (Signed)
[facility] [place], [place] [address]  Internal MEDICINE  Office Visit Note  Patient Name: Cassie Ross  [id number]  [id number]  Date of Service: 10/12/2019  Chief Complaint  Patient presents with  . Follow-up    1 day follow  . Diabetes  . Gastroesophageal Reflux  . Hyperlipidemia  . Hypertension    HPI  Pt is here today for follow up.  She had CXR yesterday that shows hyperinflation without consolidation.  She continues to wheeze and have diminished breath sounds. She reports having an attack around 4 am.  She has been doing albuterol nebs, and she received steroids in the office yesterday.     Current Medication: Outpatient Encounter Medications as of 10/12/2019  Medication Sig Note  . acetaminophen (TYLENOL) 500 MG tablet Take 1,000 mg by mouth every 8 (eight) hours as needed for moderate pain.    Marland Kitchen albuterol (PROAIR HFA) 108 (90 Base) MCG/ACT inhaler Inhale 2 puffs into the lungs every 4 (four) hours as needed for wheezing or shortness of breath.   Marland Kitchen albuterol (PROVENTIL) (2.5 MG/3ML) 0.083% nebulizer solution USE 1 VIAL IN NEBULIZER EVERY 6 HOURS AS NEEDED   . ALPRAZolam (XANAX) 0.5 MG tablet TAKE 1/2 (ONE-HALF) TABLET BY MOUTH IN THE MORNING AND TAKE 1 TABLET IN THE EVENING.   Marland Kitchen aspirin EC 81 MG tablet Take 81 mg by mouth daily.   Marland Kitchen atorvastatin (LIPITOR) 10 MG tablet Take 1 tablet (10 mg total) by mouth daily.   Marland Kitchen azithromycin (ZITHROMAX) 250 MG tablet Take one tab a day for 10 days for uri   . Blood Glucose Monitoring Suppl (ONETOUCH VERIO) w/Device KIT Use as directed diag   . budesonide-formoterol (SYMBICORT) 160-4.5 MCG/ACT inhaler Inhale 1 puff into the lungs 2 (two) times daily.   . Chlorpheniramine-APAP (CORICIDIN) 2-325 MG TABS Take 2 tablets by mouth daily as needed (cold symptoms).    . dapagliflozin propanediol (FARXIGA) 5 MG TABS tablet Take 1 tablet (5 mg total) by mouth daily before breakfast. For dm   . diltiazem (CARDIZEM CD)  180 MG 24 hr capsule Take 1 capsule (180 mg total) by mouth 2 (two) times daily.   Marland Kitchen doxycycline (VIBRA-TABS) 100 MG tablet Take 1 tablet (100 mg total) by mouth 2 (two) times daily.   Marland Kitchen Fexofenadine-Pseudoephedrine (ALLEGRA-D 24 HOUR PO) Take by mouth at bedtime.   . fluticasone (FLONASE) 50 MCG/ACT nasal spray Place 1 spray into both nostrils every morning.   . furosemide (LASIX) 80 MG tablet Take 80 mg by mouth 2 (two) times daily. 03/05/2018: MED ON HOLD   . glucose blood (ONETOUCH VERIO) test strip Use as instructed three times daily diag e11.65   . ibuprofen (ADVIL,MOTRIN) 200 MG tablet Take 200 mg by mouth every 8 (eight) hours as needed (pain).    . insulin regular (HUMULIN R) 100 units/mL injection Sliding scale - use as directed QAC per sliding scale instructions. Max daily dose is 25 units in 24 hours. E11.65   . montelukast (SINGULAIR) 10 MG tablet Take 1 tablet (10 mg total) by mouth at bedtime.   . Multiple Vitamins-Minerals (ONE-A-DAY MENOPAUSE FORMULA PO) Take 1 tablet by mouth daily.   . pantoprazole (PROTONIX) 40 MG tablet Take 40 mg by mouth 2 (two) times daily.    . potassium chloride (KLOR-CON) 10 MEQ tablet Take 1 tablet (10 mEq total) by mouth 2 (two) times daily.   . predniSONE (DELTASONE) 20 MG tablet TAKE AS DIRECTED   .  Semaglutide, 1 MG/DOSE, (OZEMPIC, 1 MG/DOSE,) 2 MG/1.5ML SOPN Inject 0.75 mLs (1 mg total) into the skin once a week.   . theophylline (THEODUR) 300 MG 12 hr tablet Take 1 tablet (300 mg total) by mouth 2 (two) times daily.    No facility-administered encounter medications on file as of 10/12/2019.    Surgical History: Past Surgical History:  Procedure Laterality Date  . cataract surgery    . CESAREAN SECTION    . CHOLECYSTECTOMY    . COLONOSCOPY WITH PROPOFOL N/A 01/06/2018   Procedure: COLONOSCOPY WITH PROPOFOL;  Surgeon: Lollie Sails, MD;  Location: [facility] ENDOSCOPY;  Service: Endoscopy;  Laterality: N/A;  . ESOPHAGEAL DILATION    .  ESOPHAGOGASTRODUODENOSCOPY (EGD) WITH PROPOFOL N/A 01/06/2018   Procedure: ESOPHAGOGASTRODUODENOSCOPY (EGD) WITH PROPOFOL;  Surgeon: Lollie Sails, MD;  Location: [facility] ENDOSCOPY;  Service: Endoscopy;  Laterality: N/A;  . HERNIA REPAIR    . hiatial hernia repair    . NASAL SINUS SURGERY      Medical History: Past Medical History:  Diagnosis Date  . Asthma   . CHF (congestive heart failure) ([place])    1991- unknown after asthma attack  . Chicken pox   . COPD (chronic obstructive pulmonary disease) ([place])   . Diabetes ([place])   . DVT (deep venous thrombosis) ([place])   . GERD (gastroesophageal reflux disease)   . Hernia, hiatal   . HLD (hyperlipidemia)   . Hypertension   . Hypertension   . Seasonal allergies     Family History: Family History  Problem Relation Age of Onset  . Hypertension Mother     Social History   Socioeconomic History  . Marital status: Married    Spouse name: Not on file  . Number of children: Not on file  . Years of education: Not on file  . Highest education level: Not on file  Occupational History  . Not on file  Tobacco Use  . Smoking status: Never Smoker  . Smokeless tobacco: Never Used  Substance and Sexual Activity  . Alcohol use: Yes    Comment: glass of wine ocassionally   . Drug use: No  . Sexual activity: Not on file  Other Topics Concern  . Not on file  Social History Narrative   Lives at home with family. Independent   Social Determinants of Health   Financial Resource Strain:   . Difficulty of Paying Living Expenses:   Food Insecurity:   . Worried About Charity fundraiser in the Last Year:   . Arboriculturist in the Last Year:   Transportation Needs:   . Film/video editor (Medical):   Marland Kitchen Lack of Transportation (Non-Medical):   Physical Activity:   . Days of Exercise per Week:   . Minutes of Exercise per Session:   Stress:   . Feeling of Stress :   Social Connections:   . Frequency of Communication with Friends and  Family:   . Frequency of Social Gatherings with Friends and Family:   . Attends Religious Services:   . Active Member of Clubs or Organizations:   . Attends Archivist Meetings:   Marland Kitchen Marital Status:   Intimate Partner Violence:   . Fear of Current or Ex-Partner:   . Emotionally Abused:   Marland Kitchen Physically Abused:   . Sexually Abused:       Review of Systems  Constitutional: Negative for chills, fatigue and unexpected weight change.  HENT: Negative for congestion, rhinorrhea, sneezing  and sore throat.   Eyes: Negative for photophobia, pain and redness.  Respiratory: Negative for cough, chest tightness and shortness of breath.   Cardiovascular: Negative for chest pain and palpitations.  Gastrointestinal: Negative for abdominal pain, constipation, diarrhea, nausea and vomiting.  Endocrine: Negative.   Genitourinary: Negative for dysuria and frequency.  Musculoskeletal: Negative for arthralgias, back pain, joint swelling and neck pain.  Skin: Negative for rash.  Allergic/Immunologic: Negative.   Neurological: Negative for tremors and numbness.  Hematological: Negative for adenopathy. Does not bruise/bleed easily.  Psychiatric/Behavioral: Negative for behavioral problems and sleep disturbance. The patient is not nervous/anxious.     Vital Signs: BP 123/78   Pulse 88   Temp (!) 97.5 F (36.4 C)   Resp 16   Ht 5' 3"  (1.6 m)   Wt 202 lb (91.6 kg)   SpO2 95%   BMI 35.78 kg/m    Physical Exam Vitals and nursing note reviewed.  Constitutional:      General: She is not in acute distress.    Appearance: She is well-developed. She is not diaphoretic.  HENT:     Head: Normocephalic and atraumatic.     Mouth/Throat:     Pharynx: No oropharyngeal exudate.  Eyes:     Pupils: Pupils are equal, round, and reactive to light.  Neck:     Thyroid: No thyromegaly.     Vascular: No JVD.     Trachea: No tracheal deviation.  Cardiovascular:     Rate and Rhythm: Normal rate and  regular rhythm.     Heart sounds: Normal heart sounds. No murmur heard.  No friction rub. No gallop.   Pulmonary:     Effort: Pulmonary effort is normal. No respiratory distress.     Breath sounds: Normal breath sounds. No wheezing or rales.  Chest:     Chest wall: No tenderness.  Abdominal:     Palpations: Abdomen is soft.     Tenderness: There is no abdominal tenderness. There is no guarding.  Musculoskeletal:        General: Normal range of motion.     Cervical back: Normal range of motion and neck supple.  Lymphadenopathy:     Cervical: No cervical adenopathy.  Skin:    General: Skin is warm and dry.  Neurological:     Mental Status: She is alert and oriented to person, place, and time.     Cranial Nerves: No cranial nerve deficit.  Psychiatric:        Behavior: Behavior normal.        Thought Content: Thought content normal.        Judgment: Judgment normal.    Assessment/Plan: 1. Intermittent asthma with acute exacerbation, unspecified asthma severity Pt given Duoneb with good results.  Given 49m of IM depo-medrol also. Discussed that patient will start prednisone taper tomorrow, and call office if symptoms fail to improve or get worse.     2. SOB (shortness of breath) - Spirometry with Graph - methylPREDNISolone acetate (DEPO-MEDROL) injection 80 mg - ipratropium-albuterol (DUONEB) 0.5-2.5 (3) MG/3ML nebulizer solution 3 mL  3. Essential hypertension Controlled, continue current management.   General Counseling: Dleanda padmoreunderstanding of the findings of todays visit and agrees with plan of treatment. I have discussed any further diagnostic evaluation that may be needed or ordered today. We also reviewed her medications today. she has been encouraged to call the office with any questions or concerns that should arise related to todays visit.    Orders  Placed This Encounter  Procedures  . Spirometry with Graph    No orders of the defined types were  placed in this encounter.   Time spent: 30 Minutes   This patient was seen by Orson Gear AGNP-C in Collaboration with Dr Lavera Guise as a part of collaborative care agreement     Kendell Bane AGNP-C Internal medicine

## 2019-10-14 ENCOUNTER — Other Ambulatory Visit: Payer: Self-pay

## 2019-10-14 DIAGNOSIS — E1165 Type 2 diabetes mellitus with hyperglycemia: Secondary | ICD-10-CM

## 2019-10-14 MED ORDER — INSULIN REGULAR HUMAN 100 UNIT/ML IJ SOLN: INTRAMUSCULAR | 5 refills | Status: DC

## 2019-10-15 ENCOUNTER — Telehealth: Payer: Self-pay

## 2019-10-15 NOTE — Telephone Encounter (Signed)
Authorization approved for Farxiga 5 mg tablets approved through 03/10/2020 [facility]

## 2019-10-28 ENCOUNTER — Encounter: Payer: Self-pay | Admitting: Internal Medicine

## 2019-10-28 ENCOUNTER — Ambulatory Visit: Payer: PPO | Admitting: Internal Medicine

## 2019-10-28 ENCOUNTER — Other Ambulatory Visit: Payer: Self-pay

## 2019-10-28 DIAGNOSIS — J45909 Unspecified asthma, uncomplicated: Secondary | ICD-10-CM

## 2019-10-28 DIAGNOSIS — J449 Chronic obstructive pulmonary disease, unspecified: Secondary | ICD-10-CM | POA: Diagnosis not present

## 2019-10-28 DIAGNOSIS — I10 Essential (primary) hypertension: Secondary | ICD-10-CM | POA: Diagnosis not present

## 2019-10-28 NOTE — Progress Notes (Signed)
[facility] [place], [place] [address]  Pulmonary Sleep Medicine   Office Visit Note  Patient Name: Cassie Ross DOB: [birth year] MRN [id number]  Date of Service: 10/28/2019  Complaints/HPI: Patient is here for routine follow-up.  Seen earlier this month for severe asthma exacerbation, treated with steroids. Today she is feeling much better and her breathing is back to her baseline. Using Symbicort daily and since exacerbation has not had to use albuterol rescue inhaler. Recent CXR during exacerbation was stable. Discussed repeating PFT for monitoring. Has had sleep studies in the past that were negative.   ROS  General: (-) fever, (-) chills, (-) night sweats, (-) weakness Skin: (-) rashes, (-) itching,. Eyes: (-) visual changes, (-) redness, (-) itching. Nose and Sinuses: (-) nasal stuffiness or itchiness, (-) postnasal drip, (-) nosebleeds, (-) sinus trouble. Mouth and Throat: (-) sore throat, (-) hoarseness. Neck: (-) swollen glands, (-) enlarged thyroid, (-) neck pain. Respiratory: - cough, (-) bloody sputum, - shortness of breath, + wheezing. Cardiovascular: - ankle swelling, (-) chest pain. Lymphatic: (-) lymph node enlargement. Neurologic: (-) numbness, (-) tingling. Psychiatric: (-) anxiety, (-) depression   Current Medication: Outpatient Encounter Medications as of 10/28/2019  Medication Sig Note  . acetaminophen (TYLENOL) 500 MG tablet Take 1,000 mg by mouth every 8 (eight) hours as needed for moderate pain.    Marland Kitchen albuterol (PROAIR HFA) 108 (90 Base) MCG/ACT inhaler Inhale 2 puffs into the lungs every 4 (four) hours as needed for wheezing or shortness of breath.   Marland Kitchen albuterol (PROVENTIL) (2.5 MG/3ML) 0.083% nebulizer solution USE 1 VIAL IN NEBULIZER EVERY 6 HOURS AS NEEDED   . ALPRAZolam (XANAX) 0.5 MG tablet TAKE 1/2 (ONE-HALF) TABLET BY MOUTH IN THE MORNING AND TAKE 1 TABLET IN THE EVENING.   Marland Kitchen aspirin EC 81 MG tablet Take 81 mg by  mouth daily.   Marland Kitchen atorvastatin (LIPITOR) 10 MG tablet Take 1 tablet (10 mg total) by mouth daily.   . Blood Glucose Monitoring Suppl (ONETOUCH VERIO) w/Device KIT Use as directed diag   . budesonide-formoterol (SYMBICORT) 160-4.5 MCG/ACT inhaler Inhale 1 puff into the lungs 2 (two) times daily.   . Chlorpheniramine-APAP (CORICIDIN) 2-325 MG TABS Take 2 tablets by mouth daily as needed (cold symptoms).    . dapagliflozin propanediol (FARXIGA) 5 MG TABS tablet Take 1 tablet (5 mg total) by mouth daily before breakfast. For dm   . diltiazem (CARDIZEM CD) 180 MG 24 hr capsule Take 1 capsule (180 mg total) by mouth 2 (two) times daily.   Marland Kitchen doxycycline (VIBRA-TABS) 100 MG tablet Take 1 tablet (100 mg total) by mouth 2 (two) times daily.   Marland Kitchen Fexofenadine-Pseudoephedrine (ALLEGRA-D 24 HOUR PO) Take by mouth at bedtime.   . fluticasone (FLONASE) 50 MCG/ACT nasal spray Place 1 spray into both nostrils every morning.   . furosemide (LASIX) 80 MG tablet Take 80 mg by mouth 2 (two) times daily. 03/05/2018: MED ON HOLD   . glucose blood (ONETOUCH VERIO) test strip Use as instructed three times daily diag e11.65   . ibuprofen (ADVIL,MOTRIN) 200 MG tablet Take 200 mg by mouth every 8 (eight) hours as needed (pain).    . insulin regular (HUMULIN R) 100 units/mL injection Sliding scale - use as directed QAC per sliding scale instructions. Max daily dose is 25 units in 24 hours. E11.65   . montelukast (SINGULAIR) 10 MG tablet Take 1 tablet (10 mg total) by mouth at bedtime.   . Multiple Vitamins-Minerals (  ONE-A-DAY MENOPAUSE FORMULA PO) Take 1 tablet by mouth daily.   . pantoprazole (PROTONIX) 40 MG tablet Take 40 mg by mouth 2 (two) times daily.    . potassium chloride (KLOR-CON) 10 MEQ tablet Take 1 tablet (10 mEq total) by mouth 2 (two) times daily.   . Semaglutide, 1 MG/DOSE, (OZEMPIC, 1 MG/DOSE,) 2 MG/1.5ML SOPN Inject 0.75 mLs (1 mg total) into the skin once a week.   . theophylline (THEODUR) 300 MG 12 hr  tablet Take 1 tablet (300 mg total) by mouth 2 (two) times daily.   . [DISCONTINUED] azithromycin (ZITHROMAX) 250 MG tablet Take one tab a day for 10 days for uri (Patient not taking: Reported on 10/28/2019)   . [DISCONTINUED] predniSONE (DELTASONE) 20 MG tablet TAKE AS DIRECTED (Patient not taking: Reported on 10/28/2019)    No facility-administered encounter medications on file as of 10/28/2019.    Surgical History: Past Surgical History:  Procedure Laterality Date  . cataract surgery    . CESAREAN SECTION    . CHOLECYSTECTOMY    . COLONOSCOPY WITH PROPOFOL N/A 01/06/2018   Procedure: COLONOSCOPY WITH PROPOFOL;  Surgeon: Lollie Sails, MD;  Location: [facility] ENDOSCOPY;  Service: Endoscopy;  Laterality: N/A;  . ESOPHAGEAL DILATION    . ESOPHAGOGASTRODUODENOSCOPY (EGD) WITH PROPOFOL N/A 01/06/2018   Procedure: ESOPHAGOGASTRODUODENOSCOPY (EGD) WITH PROPOFOL;  Surgeon: Lollie Sails, MD;  Location: [facility] ENDOSCOPY;  Service: Endoscopy;  Laterality: N/A;  . HERNIA REPAIR    . hiatial hernia repair    . NASAL SINUS SURGERY      Medical History: Past Medical History:  Diagnosis Date  . Asthma   . CHF (congestive heart failure) ([place])    1991- unknown after asthma attack  . Chicken pox   . COPD (chronic obstructive pulmonary disease) ([place])   . Diabetes ([place])   . DVT (deep venous thrombosis) ([place])   . GERD (gastroesophageal reflux disease)   . Hernia, hiatal   . HLD (hyperlipidemia)   . Hypertension   . Hypertension   . Seasonal allergies     Family History: Family History  Problem Relation Age of Onset  . Hypertension Mother     Social History: Social History   Socioeconomic History  . Marital status: Married    Spouse name: Not on file  . Number of children: Not on file  . Years of education: Not on file  . Highest education level: Not on file  Occupational History  . Not on file  Tobacco Use  . Smoking status: Never Smoker  . Smokeless tobacco: Never Used   Substance and Sexual Activity  . Alcohol use: Yes    Comment: glass of wine ocassionally   . Drug use: No  . Sexual activity: Not on file  Other Topics Concern  . Not on file  Social History Narrative   Lives at home with family. Independent   Social Determinants of Health   Financial Resource Strain:   . Difficulty of Paying Living Expenses: Not on file  Food Insecurity:   . Worried About Charity fundraiser in the Last Year: Not on file  . Ran Out of Food in the Last Year: Not on file  Transportation Needs:   . Lack of Transportation (Medical): Not on file  . Lack of Transportation (Non-Medical): Not on file  Physical Activity:   . Days of Exercise per Week: Not on file  . Minutes of Exercise per Session: Not on file  Stress:   .  Feeling of Stress : Not on file  Social Connections:   . Frequency of Communication with Friends and Family: Not on file  . Frequency of Social Gatherings with Friends and Family: Not on file  . Attends Religious Services: Not on file  . Active Member of Clubs or Organizations: Not on file  . Attends Archivist Meetings: Not on file  . Marital Status: Not on file  Intimate Partner Violence:   . Fear of Current or Ex-Partner: Not on file  . Emotionally Abused: Not on file  . Physically Abused: Not on file  . Sexually Abused: Not on file    Vital Signs: Blood pressure 122/84, pulse 96, temperature (!) 97.3 F (36.3 C), resp. rate 16, height 5' 3"  (1.6 m), weight 199 lb 3.2 oz (90.4 kg), SpO2 97 %.  Examination: General Appearance: The patient is well-developed, well-nourished, and in no distress. Skin: Gross inspection of skin unremarkable. Head: normocephalic, no gross deformities. Eyes: no gross deformities noted. ENT: ears appear grossly normal no exudates. Neck: Supple. No thyromegaly. No LAD. Respiratory: Wheezing throughout, more prominent in bases. Cardiovascular: Normal S1 and S2 without murmur or rub. Extremities: No  cyanosis. pulses are equal. Neurologic: Alert and oriented. No involuntary movements.  LABS: Recent Results (from the past 2160 hour(s))  POCT HgB A1C     Status: Abnormal   Collection Time: 10/11/19 11:51 AM  Result Value Ref Range   Hemoglobin A1C 8.7 (A) 4.0 - 5.6 %   HbA1c POC (<> result, manual entry)     HbA1c, POC (prediabetic range)     HbA1c, POC (controlled diabetic range)      Radiology: DG Chest 2 View  Result Date: 10/11/2019 CLINICAL DATA:  Dyspnea and wheezing EXAM: CHEST - 2 VIEW COMPARISON:  03/19/2017 FINDINGS: Mild hyperinflation with mild bronchitic change. No consolidation or effusion. Normal heart size. No pneumothorax. IMPRESSION: Mild hyperinflation with bronchitic changes. No focal pulmonary infiltrate. Electronically Signed   By: Donavan Foil M.D.   On: 10/11/2019 22:03    No results found.  DG Chest 2 View  Result Date: 10/11/2019 CLINICAL DATA:  Dyspnea and wheezing EXAM: CHEST - 2 VIEW COMPARISON:  03/19/2017 FINDINGS: Mild hyperinflation with mild bronchitic change. No consolidation or effusion. Normal heart size. No pneumothorax. IMPRESSION: Mild hyperinflation with bronchitic changes. No focal pulmonary infiltrate. Electronically Signed   By: Donavan Foil M.D.   On: 10/11/2019 22:03      Assessment and Plan: Patient Active Problem List   Diagnosis Date Noted  . Encounter for long-term (current) use of medications 04/14/2019  . Encounter for screening mammogram for malignant neoplasm of breast 03/02/2019  . Encounter for therapeutic drug level monitoring 03/02/2019  . Gastroesophageal reflux disease without esophagitis 03/02/2019  . Mastitis in female 02/21/2019  . Severe persistent asthma without complication 00/86/7619  . Encounter for general adult medical examination with abnormal findings 10/08/2018  . Dysuria 10/08/2018  . 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 Family history of colon cancer 05/01/2018  .  Pulmonary nodule 05/01/2018  . Neoplasm of uncertain behavior of skin of face 05/01/2018  . Acute upper respiratory infection 01/22/2018  . Generalized anxiety disorder 01/22/2018  . Venous stasis ulcer of left lower leg with edema of left lower leg (HCC) 12/07/2017  . Needs flu shot 12/07/2017  . Uncontrolled type 2 diabetes mellitus with hyperglycemia ([place]) 06/08/2017  . Atopic dermatitis 06/08/2017  . Severe asthma without complication 50/93/2671  . Hypokalemia  06/08/2017  . Influenza A 04/22/2016  . Essential hypertension 04/21/2016  . Diabetes ([place]) 04/21/2016  . GERD (gastroesophageal reflux disease) 04/21/2016  . Pneumonia 02/26/2016  . Severe persistent asthma with acute exacerbation 02/21/2016  . Primary osteoarthritis of left knee 12/27/2014  . DDD (degenerative disc disease), lumbar 09/28/2013  . Lumbar radiculitis 09/28/2013    1. Chronic obstructive pulmonary disease, unspecified COPD type ([place]) Stable at this time, continue to monitor.  2. Severe asthma without complication, unspecified whether persistent Stable at this time, continue to monitor. Recent exacerbation with abnormal spirometry. Exacerbation resolved, will review PFT for comparison. - Pulmonary function test; Future  3. Essential hypertension Slightly elevated BP today, will monitor. Labs ordered for PCP annual PE. - CBC w/Diff/Platelet - COMPLETE METABOLIC PANEL WITH GFR - Lipid Panel With LDL/HDL Ratio - TSH + free T4 - Vitamin D 1,25 dihydroxy - B12  General Counseling: I have discussed the findings of the evaluation and examination with Cassie Ross.  I have also discussed any further diagnostic evaluation thatmay be needed or ordered today. Cassie Ross verbalizes understanding of the findings of todays visit. We also reviewed her medications today and discussed drug interactions and side effects including but not limited excessive drowsiness and altered mental states. We also discussed that there is always  a risk not just to her but also people around her. she has been encouraged to call the office with any questions or concerns that should arise related to todays visit.  Orders Placed This Encounter  Procedures  . CBC w/Diff/Platelet  . COMPLETE METABOLIC PANEL WITH GFR  . Lipid Panel With LDL/HDL Ratio  . TSH + free T4  . Vitamin D 1,25 dihydroxy  . B12  . Pulmonary function test    Standing Status:   Future    Standing Expiration Date:   10/27/2020    Order Specific Question:   Where should this test be performed?    Answer:   [facility]     Time spent: 38  I have personally obtained a history, examined the patient, evaluated laboratory and imaging results, formulated the assessment and plan and placed orders. This patient was seen by Casey Burkitt AGNP-C in Collaboration with Dr. Devona Konig as a part of collaborative care agreement.    Allyne Gee, MD [facility] Pulmonary and Critical Care Sleep medicine

## 2019-10-29 NOTE — Patient Instructions (Signed)
Asthma, Adult  Asthma is a long-term (chronic) condition in which the airways get tight and narrow. The airways are the breathing passages that lead from the nose and mouth down into the lungs. A person with asthma will have times when symptoms get worse. These are called asthma attacks. They can cause coughing, whistling sounds when you breathe (wheezing), shortness of breath, and chest pain. They can make it hard to breathe. There is no cure for asthma, but medicines and lifestyle changes can help control it. There are many things that can bring on an asthma attack or make asthma symptoms worse (triggers). Common triggers include:  Mold.  Dust.  Cigarette smoke.  Cockroaches.  Things that can cause allergy symptoms (allergens). These include animal skin flakes (dander) and pollen from trees or grass.  Things that pollute the air. These may include household cleaners, wood smoke, smog, or chemical odors.  Cold air, weather changes, and wind.  Crying or laughing hard.  Stress.  Certain medicines or drugs.  Certain foods such as dried fruit, potato chips, and grape juice.  Infections, such as a cold or the flu.  Certain medical conditions or diseases.  Exercise or tiring activities. Asthma may be treated with medicines and by staying away from the things that cause asthma attacks. Types of medicines may include:  Controller medicines. These help prevent asthma symptoms. They are usually taken every day.  Fast-acting reliever or rescue medicines. These quickly relieve asthma symptoms. They are used as needed and provide short-term relief.  Allergy medicines if your attacks are brought on by allergens.  Medicines to help control the body's defense (immune) system. Follow these instructions at home: Avoiding triggers in your home  Change your heating and air conditioning filter often.  Limit your use of fireplaces and wood stoves.  Get rid of pests (such as roaches and  mice) and their droppings.  Throw away plants if you see mold on them.  Clean your floors. Dust regularly. Use cleaning products that do not smell.  Have someone vacuum when you are not home. Use a vacuum cleaner with a HEPA filter if possible.  Replace carpet with wood, tile, or vinyl flooring. Carpet can trap animal skin flakes and dust.  Use allergy-proof pillows, mattress covers, and box spring covers.  Wash bed sheets and blankets every week in hot water. Dry them in a dryer.  Keep your bedroom free of any triggers.  Avoid pets and keep windows closed when things that cause allergy symptoms are in the air.  Use blankets that are made of polyester or cotton.  Clean bathrooms and kitchens with bleach. If possible, have someone repaint the walls in these rooms with mold-resistant paint. Keep out of the rooms that are being cleaned and painted.  Wash your hands often with soap and water. If soap and water are not available, use hand sanitizer.  Do not allow anyone to smoke in your home. General instructions  Take over-the-counter and prescription medicines only as told by your doctor. ? Talk with your doctor if you have questions about how or when to take your medicines. ? Make note if you need to use your medicines more often than usual.  Do not use any products that contain nicotine or tobacco, such as cigarettes and e-cigarettes. If you need help quitting, ask your doctor.  Stay away from secondhand smoke.  Avoid doing things outdoors when allergen counts are high and when air quality is low.  Wear a ski mask   when doing outdoor activities in the winter. The mask should cover your nose and mouth. Exercise indoors on cold days if you can.  Warm up before you exercise. Take time to cool down after exercise.  Use a peak flow meter as told by your doctor. A peak flow meter is a tool that measures how well the lungs are working.  Keep track of the peak flow meter's readings.  Write them down.  Follow your asthma action plan. This is a written plan for taking care of your asthma and treating your attacks.  Make sure you get all the shots (vaccines) that your doctor recommends. Ask your doctor about a flu shot and a pneumonia shot.  Keep all follow-up visits as told by your doctor. This is important. Contact a doctor if:  You have wheezing, shortness of breath, or a cough even while taking medicine to prevent attacks.  The mucus you cough up (sputum) is thicker than usual.  The mucus you cough up changes from clear or white to yellow, green, gray, or bloody.  You have problems from the medicine you are taking, such as: ? A rash. ? Itching. ? Swelling. ? Trouble breathing.  You need reliever medicines more than 2-3 times a week.  Your peak flow reading is still at 50-79% of your personal best after following the action plan for 1 hour.  You have a fever. Get help right away if:  You seem to be worse and are not responding to medicine during an asthma attack.  You are short of breath even at rest.  You get short of breath when doing very little activity.  You have trouble eating, drinking, or talking.  You have chest pain or tightness.  You have a fast heartbeat.  Your lips or fingernails start to turn blue.  You are light-headed or dizzy, or you faint.  Your peak flow is less than 50% of your personal best.  You feel too tired to breathe normally. Summary  Asthma is a long-term (chronic) condition in which the airways get tight and narrow. An asthma attack can make it hard to breathe.  Asthma cannot be cured, but medicines and lifestyle changes can help control it.  Make sure you understand how to avoid triggers and how and when to use your medicines. This information is not intended to replace advice given to you by your health care provider. Make sure you discuss any questions you have with your health care provider. Document Revised:  04/30/2018 Document Reviewed: 04/01/2016 Elsevier Patient Education  2020 [employer]  

## 2019-10-31 NOTE — Addendum Note (Signed)
Addended by: Devona Konig on: 10/31/2019 10:22 AM   Modules accepted: Level of Service

## 2019-11-03 DIAGNOSIS — D123 Benign neoplasm of transverse colon: Secondary | ICD-10-CM | POA: Diagnosis not present

## 2019-11-03 DIAGNOSIS — D369 Benign neoplasm, unspecified site: Secondary | ICD-10-CM | POA: Diagnosis not present

## 2019-11-03 DIAGNOSIS — K640 First degree hemorrhoids: Secondary | ICD-10-CM | POA: Diagnosis not present

## 2019-11-03 DIAGNOSIS — Z880 Allergy status to penicillin: Secondary | ICD-10-CM | POA: Diagnosis not present

## 2019-11-03 DIAGNOSIS — K635 Polyp of colon: Secondary | ICD-10-CM | POA: Diagnosis not present

## 2019-11-03 DIAGNOSIS — Z86010 Personal history of colonic polyps: Secondary | ICD-10-CM | POA: Diagnosis not present

## 2019-11-03 DIAGNOSIS — K5730 Diverticulosis of large intestine without perforation or abscess without bleeding: Secondary | ICD-10-CM | POA: Diagnosis not present

## 2019-11-03 DIAGNOSIS — I10 Essential (primary) hypertension: Secondary | ICD-10-CM | POA: Diagnosis not present

## 2019-11-03 DIAGNOSIS — Z9049 Acquired absence of other specified parts of digestive tract: Secondary | ICD-10-CM | POA: Diagnosis not present

## 2019-11-03 DIAGNOSIS — J45909 Unspecified asthma, uncomplicated: Secondary | ICD-10-CM | POA: Diagnosis not present

## 2019-11-03 DIAGNOSIS — Z1211 Encounter for screening for malignant neoplasm of colon: Secondary | ICD-10-CM | POA: Diagnosis not present

## 2019-11-11 ENCOUNTER — Telehealth: Payer: Self-pay

## 2019-11-11 DIAGNOSIS — Z1152 Encounter for screening for COVID-19: Secondary | ICD-10-CM | POA: Diagnosis not present

## 2019-11-11 DIAGNOSIS — Z03818 Encounter for observation for suspected exposure to other biological agents ruled out: Secondary | ICD-10-CM | POA: Diagnosis not present

## 2019-11-11 NOTE — Telephone Encounter (Signed)
none

## 2019-11-12 ENCOUNTER — Ambulatory Visit (INDEPENDENT_AMBULATORY_CARE_PROVIDER_SITE_OTHER): Payer: PPO | Admitting: Nurse Practitioner

## 2019-11-12 ENCOUNTER — Encounter: Payer: Self-pay | Admitting: Nurse Practitioner

## 2019-11-12 VITALS — Ht 63.0 in | Wt 199.0 lb

## 2019-11-12 DIAGNOSIS — J069 Acute upper respiratory infection, unspecified: Secondary | ICD-10-CM | POA: Diagnosis not present

## 2019-11-12 DIAGNOSIS — J029 Acute pharyngitis, unspecified: Secondary | ICD-10-CM

## 2019-11-12 DIAGNOSIS — J45909 Unspecified asthma, uncomplicated: Secondary | ICD-10-CM | POA: Diagnosis not present

## 2019-11-12 MED ORDER — MAGIC MOUTHWASH W/LIDOCAINE: 5.0000 mL | Freq: Four times a day (QID) | ORAL | 0 refills | Status: DC | PRN

## 2019-11-12 MED ORDER — DOXYCYCLINE HYCLATE 100 MG PO TABS: 100.0000 mg | ORAL_TABLET | Freq: Two times a day (BID) | ORAL | 2 refills | Status: DC

## 2019-11-12 NOTE — Progress Notes (Signed)
[facility] [place], [place] [address]  Internal MEDICINE  Telephone Visit  Patient Name: Cassie Ross  [id number]  [id number]  Date of Service: 12/01/2019  I connected with the patient at 4:41pm by telephone and verified the patients identity using two identifiers.   I discussed the limitations, risks, security and privacy concerns of performing an evaluation and management service by telephone and the availability of in person appointments. I also discussed with the patient that there may be a patient responsible charge related to the service.  The patient expressed understanding and agrees to proceed.    Chief Complaint  Patient presents with  . Telephone Screen  . Telephone Assessment  . Sore Throat  . Cough    The patient has been contacted via telephone for follow up visit due to concerns for spread of novel coronavirus. She is presenting for acute visit. She is having nasal congestion, headache, and sore/scratchy throat. This has been going on for several days and gradually been getting worse. She has completed treatment with z-pack and this has not helped. She denies fever, chills, or body aches. She has not been tested for COVID 19, but has not been out of her home to be exposed to COVID 19. She has mild congestion in her chest with mild cough which is non-productive. She is not wheezing or having more severe breathing issues than usual.       Current Medication: Outpatient Encounter Medications as of 11/12/2019  Medication Sig Note  . acetaminophen (TYLENOL) 500 MG tablet Take 1,000 mg by mouth every 8 (eight) hours as needed for moderate pain.    Marland Kitchen albuterol (PROAIR HFA) 108 (90 Base) MCG/ACT inhaler Inhale 2 puffs into the lungs every 4 (four) hours as needed for wheezing or shortness of breath.   Marland Kitchen albuterol (PROVENTIL) (2.5 MG/3ML) 0.083% nebulizer solution USE 1 VIAL IN NEBULIZER EVERY 6 HOURS AS NEEDED   . ALPRAZolam (XANAX) 0.5 MG tablet  TAKE 1/2 (ONE-HALF) TABLET BY MOUTH IN THE MORNING AND TAKE 1 TABLET IN THE EVENING.   Marland Kitchen aspirin EC 81 MG tablet Take 81 mg by mouth daily.   Marland Kitchen atorvastatin (LIPITOR) 10 MG tablet Take 1 tablet (10 mg total) by mouth daily.   . Blood Glucose Monitoring Suppl (ONETOUCH VERIO) w/Device KIT Use as directed diag   . budesonide-formoterol (SYMBICORT) 160-4.5 MCG/ACT inhaler Inhale 1 puff into the lungs 2 (two) times daily.   . Chlorpheniramine-APAP (CORICIDIN) 2-325 MG TABS Take 2 tablets by mouth daily as needed (cold symptoms).    . dapagliflozin propanediol (FARXIGA) 5 MG TABS tablet Take 1 tablet (5 mg total) by mouth daily before breakfast. For dm   . diltiazem (CARDIZEM CD) 180 MG 24 hr capsule Take 1 capsule (180 mg total) by mouth 2 (two) times daily.   Marland Kitchen doxycycline (VIBRA-TABS) 100 MG tablet Take 1 tablet (100 mg total) by mouth 2 (two) times daily.   Marland Kitchen Fexofenadine-Pseudoephedrine (ALLEGRA-D 24 HOUR PO) Take by mouth at bedtime.   . fluticasone (FLONASE) 50 MCG/ACT nasal spray Place 1 spray into both nostrils every morning.   . furosemide (LASIX) 80 MG tablet Take 80 mg by mouth 2 (two) times daily. 03/05/2018: MED ON HOLD   . glucose blood (ONETOUCH VERIO) test strip Use as instructed three times daily diag e11.65   . ibuprofen (ADVIL,MOTRIN) 200 MG tablet Take 200 mg by mouth every 8 (eight) hours as needed (pain).    . insulin regular (HUMULIN  R) 100 units/mL injection Sliding scale - use as directed QAC per sliding scale instructions. Max daily dose is 25 units in 24 hours. E11.65   . Multiple Vitamins-Minerals (ONE-A-DAY MENOPAUSE FORMULA PO) Take 1 tablet by mouth daily.   . pantoprazole (PROTONIX) 40 MG tablet Take 40 mg by mouth 2 (two) times daily.    . potassium chloride (KLOR-CON) 10 MEQ tablet Take 1 tablet (10 mEq total) by mouth 2 (two) times daily.   . Semaglutide, 1 MG/DOSE, (OZEMPIC, 1 MG/DOSE,) 2 MG/1.5ML SOPN Inject 0.75 mLs (1 mg total) into the skin once a week.   .  theophylline (THEODUR) 300 MG 12 hr tablet Take 1 tablet (300 mg total) by mouth 2 (two) times daily.   . [DISCONTINUED] doxycycline (VIBRA-TABS) 100 MG tablet Take 1 tablet (100 mg total) by mouth 2 (two) times daily.   . [DISCONTINUED] montelukast (SINGULAIR) 10 MG tablet Take 1 tablet (10 mg total) by mouth at bedtime.   . magic mouthwash w/lidocaine SOLN Take 5 mLs by mouth 4 (four) times daily as needed for mouth pain.    No facility-administered encounter medications on file as of 11/12/2019.    Surgical History: Past Surgical History:  Procedure Laterality Date  . cataract surgery    . CESAREAN SECTION    . CHOLECYSTECTOMY    . COLONOSCOPY WITH PROPOFOL N/A 01/06/2018   Procedure: COLONOSCOPY WITH PROPOFOL;  Surgeon: Skulskie, Martin U, MD;  Location: [facility] ENDOSCOPY;  Service: Endoscopy;  Laterality: N/A;  . ESOPHAGEAL DILATION    . ESOPHAGOGASTRODUODENOSCOPY (EGD) WITH PROPOFOL N/A 01/06/2018   Procedure: ESOPHAGOGASTRODUODENOSCOPY (EGD) WITH PROPOFOL;  Surgeon: Skulskie, Martin U, MD;  Location: [facility] ENDOSCOPY;  Service: Endoscopy;  Laterality: N/A;  . HERNIA REPAIR    . hiatial hernia repair    . NASAL SINUS SURGERY      Medical History: Past Medical History:  Diagnosis Date  . Asthma   . CHF (congestive heart failure) (HCC)    1991- unknown after asthma attack  . Chicken pox   . COPD (chronic obstructive pulmonary disease) (HCC)   . Diabetes (HCC)   . DVT (deep venous thrombosis) (HCC)   . GERD (gastroesophageal reflux disease)   . Hernia, hiatal   . HLD (hyperlipidemia)   . Hypertension   . Hypertension   . Seasonal allergies     Family History: Family History  Problem Relation Age of Onset  . Hypertension Mother     Social History   Socioeconomic History  . Marital status: Married    Spouse name: Not on file  . Number of children: Not on file  . Years of education: Not on file  . Highest education level: Not on file  Occupational History  . Not on  file  Tobacco Use  . Smoking status: Never Smoker  . Smokeless tobacco: Never Used  Substance and Sexual Activity  . Alcohol use: Yes    Comment: glass of wine ocassionally   . Drug use: No  . Sexual activity: Not on file  Other Topics Concern  . Not on file  Social History Narrative   Lives at home with family. Independent   Social Determinants of Health   Financial Resource Strain:   . Difficulty of Paying Living Expenses: Not on file  Food Insecurity:   . Worried About Running Out of Food in the Last Year: Not on file  . Ran Out of Food in the Last Year: Not on file  Transportation Needs:   .   Lack of Transportation (Medical): Not on file  . Lack of Transportation (Non-Medical): Not on file  Physical Activity:   . Days of Exercise per Week: Not on file  . Minutes of Exercise per Session: Not on file  Stress:   . Feeling of Stress : Not on file  Social Connections:   . Frequency of Communication with Friends and Family: Not on file  . Frequency of Social Gatherings with Friends and Family: Not on file  . Attends Religious Services: Not on file  . Active Member of Clubs or Organizations: Not on file  . Attends Club or Organization Meetings: Not on file  . Marital Status: Not on file  Intimate Partner Violence:   . Fear of Current or Ex-Partner: Not on file  . Emotionally Abused: Not on file  . Physically Abused: Not on file  . Sexually Abused: Not on file      Review of Systems  Constitutional: Positive for fatigue. Negative for chills and fever.  HENT: Positive for congestion, ear pain, postnasal drip, rhinorrhea, sinus pain and sore throat. Negative for voice change.   Respiratory: Positive for cough. Negative for wheezing.   Cardiovascular: Negative for chest pain and palpitations.  Gastrointestinal: Positive for nausea. Negative for vomiting.  Musculoskeletal: Negative for arthralgias and myalgias.  Allergic/Immunologic: Positive for environmental allergies.   Neurological: Positive for headaches.  Hematological: Positive for adenopathy.    Today's Vitals   11/12/19 1544  Weight: 199 lb (90.3 kg)  Height: 5' 3" (1.6 m)   Body mass index is 35.25 kg/m.  Observation/Objective:   The patient is alert and oriented. She is pleasant and answers all questions appropriately. Breathing is non-labored. She is in no acute distress at this time. She sounds nasally congested and hoarse. She is not having shortness of breath and no cough is noted at this time.    Assessment/Plan: 1. Acute upper respiratory infection Start doxycycline 100mg twice daily for next few weeks. Gargle with warm salt water. May use OTC medication as needed and as indicated to improve acutte symptoms.  - doxycycline (VIBRA-TABS) 100 MG tablet; Take 1 tablet (100 mg total) by mouth 2 (two) times daily.  Dispense: 42 tablet; Refill: 2  2. Sore throat Gargle with magic mouthwash up to four times daily as needed for sore throat.  - magic mouthwash w/lidocaine SOLN; Take 5 mLs by mouth 4 (four) times daily as needed for mouth pain.  Dispense: 150 mL; Refill: 0  3. Severe asthma without complication, unspecified whether persistent Use inhalers and respiratory medications as prescribed   General Counseling: Ceil verbalizes understanding of the findings of today's phone visit and agrees with plan of treatment. I have discussed any further diagnostic evaluation that may be needed or ordered today. We also reviewed her medications today. she has been encouraged to call the office with any questions or concerns that should arise related to todays visit.   This patient was seen by Heather Boscia FNP Collaboration with Dr Fozia M Khan as a part of collaborative care agreement  Meds ordered this encounter  Medications  . doxycycline (VIBRA-TABS) 100 MG tablet    Sig: Take 1 tablet (100 mg total) by mouth 2 (two) times daily.    Dispense:  42 tablet    Refill:  2    Order Specific  Question:   Supervising Provider    Answer:   KHAN, FOZIA M [[id number]]  . magic mouthwash w/lidocaine SOLN    Sig: Take 5   mLs by mouth 4 (four) times daily as needed for mouth pain.    Dispense:  150 mL    Refill:  0    Order Specific Question:   Supervising Provider    Answer:   KHAN, FOZIA M [[id number]]    Time spent: 30 Minutes    Dr Fozia M Khan Internal medicine  

## 2019-11-24 ENCOUNTER — Ambulatory Visit: Payer: PPO | Admitting: Internal Medicine

## 2019-11-29 ENCOUNTER — Other Ambulatory Visit: Payer: Self-pay

## 2019-11-29 MED ORDER — MONTELUKAST SODIUM 10 MG PO TABS
10.0000 mg | ORAL_TABLET | Freq: Every day | ORAL | 1 refills | Status: DC
Start: 2019-11-29 — End: 2019-12-06

## 2019-12-01 DIAGNOSIS — J029 Acute pharyngitis, unspecified: Secondary | ICD-10-CM | POA: Insufficient documentation

## 2019-12-02 ENCOUNTER — Inpatient Hospital Stay
Admission: EM | Admit: 2019-12-02 | Discharge: 2019-12-04 | DRG: 202 | Disposition: A | Discharge status: Home / Self Care | Payer: PPO | Attending: Internal Medicine | Admitting: Internal Medicine

## 2019-12-02 ENCOUNTER — Encounter: Payer: Self-pay | Admitting: Emergency Medicine

## 2019-12-02 ENCOUNTER — Other Ambulatory Visit: Payer: Self-pay

## 2019-12-02 ENCOUNTER — Emergency Department: Payer: PPO

## 2019-12-02 DIAGNOSIS — R0989 Other specified symptoms and signs involving the circulatory and respiratory systems: Secondary | ICD-10-CM | POA: Diagnosis not present

## 2019-12-02 DIAGNOSIS — Z86718 Personal history of other venous thrombosis and embolism: Secondary | ICD-10-CM | POA: Diagnosis not present

## 2019-12-02 DIAGNOSIS — I10 Essential (primary) hypertension: Secondary | ICD-10-CM | POA: Diagnosis present

## 2019-12-02 DIAGNOSIS — J9601 Acute respiratory failure with hypoxia: Secondary | ICD-10-CM | POA: Diagnosis present

## 2019-12-02 DIAGNOSIS — E119 Type 2 diabetes mellitus without complications: Secondary | ICD-10-CM | POA: Diagnosis present

## 2019-12-02 DIAGNOSIS — Z79899 Other long term (current) drug therapy: Secondary | ICD-10-CM | POA: Diagnosis not present

## 2019-12-02 DIAGNOSIS — Z794 Long term (current) use of insulin: Secondary | ICD-10-CM

## 2019-12-02 DIAGNOSIS — R0602 Shortness of breath: Secondary | ICD-10-CM | POA: Diagnosis not present

## 2019-12-02 DIAGNOSIS — Z20822 Contact with and (suspected) exposure to covid-19: Secondary | ICD-10-CM | POA: Diagnosis not present

## 2019-12-02 DIAGNOSIS — Z7982 Long term (current) use of aspirin: Secondary | ICD-10-CM

## 2019-12-02 DIAGNOSIS — J45901 Unspecified asthma with (acute) exacerbation: Principal | ICD-10-CM | POA: Diagnosis present

## 2019-12-02 DIAGNOSIS — K219 Gastro-esophageal reflux disease without esophagitis: Secondary | ICD-10-CM | POA: Diagnosis not present

## 2019-12-02 DIAGNOSIS — R079 Chest pain, unspecified: Secondary | ICD-10-CM | POA: Diagnosis not present

## 2019-12-02 DIAGNOSIS — J441 Chronic obstructive pulmonary disease with (acute) exacerbation: Secondary | ICD-10-CM

## 2019-12-02 DIAGNOSIS — R069 Unspecified abnormalities of breathing: Secondary | ICD-10-CM | POA: Diagnosis not present

## 2019-12-02 DIAGNOSIS — R000 Tachycardia, unspecified: Secondary | ICD-10-CM | POA: Diagnosis not present

## 2019-12-02 DIAGNOSIS — E785 Hyperlipidemia, unspecified: Secondary | ICD-10-CM | POA: Diagnosis present

## 2019-12-02 DIAGNOSIS — R0902 Hypoxemia: Secondary | ICD-10-CM | POA: Diagnosis not present

## 2019-12-02 DIAGNOSIS — Z7951 Long term (current) use of inhaled steroids: Secondary | ICD-10-CM | POA: Diagnosis not present

## 2019-12-02 LAB — CBC
HCT: 40.1 % (ref 36.0–46.0)
Hemoglobin: 14.1 g/dL (ref 12.0–15.0)
MCH: 31.1 pg (ref 26.0–34.0)
MCHC: 35.2 g/dL (ref 30.0–36.0)
MCV: 88.5 fL (ref 80.0–100.0)
Platelets: 314 10*3/uL (ref 150–400)
RBC: 4.53 MIL/uL (ref 3.87–5.11)
RDW: 13.4 % (ref 11.5–15.5)
WBC: 11 10*3/uL — ABNORMAL HIGH (ref 4.0–10.5)
nRBC: 0 % (ref 0.0–0.2)

## 2019-12-02 LAB — BASIC METABOLIC PANEL
Anion gap: 13 (ref 5–15)
BUN: 6 mg/dL (ref 6–20)
CO2: 23 mmol/L (ref 22–32)
Calcium: 8.8 mg/dL — ABNORMAL LOW (ref 8.9–10.3)
Chloride: 105 mmol/L (ref 98–111)
Creatinine, Ser: 0.45 mg/dL (ref 0.44–1.00)
GFR calc Af Amer: >60 mL/min (ref >60)
GFR calc non Af Amer: >60 mL/min (ref >60)
Glucose, Bld: 149 mg/dL — ABNORMAL HIGH (ref 70–99)
Potassium: 3.7 mmol/L (ref 3.5–5.1)
Sodium: 141 mmol/L (ref 135–145)

## 2019-12-02 LAB — BLOOD GAS, VENOUS
Acid-base deficit: 1.8 mmol/L (ref 0.0–2.0)
Bicarbonate: 23.7 mmol/L (ref 20.0–28.0)
O2 Saturation: 62.5 %
Patient temperature: 37
pCO2, Ven: 42 mmHg — ABNORMAL LOW (ref 44.0–60.0)
pH, Ven: 7.36 (ref 7.250–7.430)
pO2, Ven: 34 mmHg (ref 32.0–45.0)

## 2019-12-02 LAB — RESPIRATORY PANEL BY RT PCR (FLU A&B, COVID)
Influenza A by PCR: NEGATIVE
Influenza B by PCR: NEGATIVE
SARS Coronavirus 2 by RT PCR: NEGATIVE

## 2019-12-02 LAB — TROPONIN I (HIGH SENSITIVITY)
Troponin I (High Sensitivity): 5 ng/L (ref <18)
Troponin I (High Sensitivity): 5 ng/L (ref <18)

## 2019-12-02 LAB — GLUCOSE, CAPILLARY: Glucose-Capillary: 270 mg/dL — ABNORMAL HIGH (ref 70–99)

## 2019-12-02 MED ORDER — DOCUSATE SODIUM 100 MG PO CAPS: 100.0000 mg | ORAL_CAPSULE | Freq: Two times a day (BID) | ORAL | Status: DC | PRN

## 2019-12-02 MED ORDER — LEVALBUTEROL HCL 0.63 MG/3ML IN NEBU
0.6300 mg | INHALATION_SOLUTION | Freq: Four times a day (QID) | RESPIRATORY_TRACT | Status: DC
  Administered 2019-12-02 – 2019-12-03 (×2): 0.63 mg via RESPIRATORY_TRACT
  Filled 2019-12-02 (×5): qty 3

## 2019-12-02 MED ORDER — FLUTICASONE FUROATE-VILANTEROL 200-25 MCG/INH IN AEPB: 1.0000 | INHALATION_SPRAY | Freq: Every day | RESPIRATORY_TRACT | Status: DC

## 2019-12-02 MED ORDER — CALCIUM CARBONATE ANTACID 500 MG PO CHEW: 1.0000 | CHEWABLE_TABLET | Freq: Three times a day (TID) | ORAL | Status: DC | PRN

## 2019-12-02 MED ORDER — ATORVASTATIN CALCIUM 10 MG PO TABS
10.0000 mg | ORAL_TABLET | Freq: Every day | ORAL | Status: DC
  Administered 2019-12-03 – 2019-12-04 (×2): 10 mg via ORAL
  Filled 2019-12-02 (×2): qty 1

## 2019-12-02 MED ORDER — POLYETHYLENE GLYCOL 3350 17 G PO PACK: 17.0000 g | PACK | Freq: Two times a day (BID) | ORAL | Status: DC | PRN

## 2019-12-02 MED ORDER — ONDANSETRON HCL 4 MG/2ML IJ SOLN: 4.0000 mg | Freq: Four times a day (QID) | INTRAMUSCULAR | Status: DC | PRN

## 2019-12-02 MED ORDER — GUAIFENESIN-DM 100-10 MG/5ML PO SYRP
10.0000 mL | ORAL_SOLUTION | Freq: Four times a day (QID) | ORAL | Status: DC | PRN
  Filled 2019-12-02: qty 10

## 2019-12-02 MED ORDER — INSULIN ASPART 100 UNIT/ML ~~LOC~~ SOLN
0.0000 [IU] | Freq: Every day | SUBCUTANEOUS | Status: DC
  Administered 2019-12-02: 3 [IU] via SUBCUTANEOUS
  Filled 2019-12-02: qty 1

## 2019-12-02 MED ORDER — METHYLPREDNISOLONE SODIUM SUCC 125 MG IJ SOLR
125.0000 mg | Freq: Once | INTRAMUSCULAR | Status: AC
  Administered 2019-12-02: 125 mg via INTRAVENOUS
  Filled 2019-12-02: qty 2

## 2019-12-02 MED ORDER — DILTIAZEM HCL ER COATED BEADS 180 MG PO CP24
180.0000 mg | ORAL_CAPSULE | Freq: Two times a day (BID) | ORAL | Status: DC
  Administered 2019-12-02 – 2019-12-04 (×4): 180 mg via ORAL
  Filled 2019-12-02 (×6): qty 1

## 2019-12-02 MED ORDER — PREDNISONE 20 MG PO TABS
40.0000 mg | ORAL_TABLET | Freq: Every day | ORAL | Status: DC
  Administered 2019-12-03 – 2019-12-04 (×2): 40 mg via ORAL
  Filled 2019-12-02 (×2): qty 2

## 2019-12-02 MED ORDER — IPRATROPIUM-ALBUTEROL 0.5-2.5 (3) MG/3ML IN SOLN
3.0000 mL | Freq: Once | RESPIRATORY_TRACT | Status: AC
  Administered 2019-12-02: 3 mL via RESPIRATORY_TRACT
  Filled 2019-12-02: qty 3

## 2019-12-02 MED ORDER — INSULIN ASPART 100 UNIT/ML ~~LOC~~ SOLN
0.0000 [IU] | Freq: Three times a day (TID) | SUBCUTANEOUS | Status: DC
  Administered 2019-12-02 – 2019-12-03 (×2): 4 [IU] via SUBCUTANEOUS
  Administered 2019-12-03 (×2): 11 [IU] via SUBCUTANEOUS
  Filled 2019-12-02 (×5): qty 1

## 2019-12-02 MED ORDER — THEOPHYLLINE ER 300 MG PO TB12
300.0000 mg | ORAL_TABLET | Freq: Two times a day (BID) | ORAL | Status: DC
  Administered 2019-12-02 – 2019-12-04 (×4): 300 mg via ORAL
  Filled 2019-12-02 (×7): qty 1

## 2019-12-02 MED ORDER — ENOXAPARIN SODIUM 40 MG/0.4ML ~~LOC~~ SOLN
40.0000 mg | SUBCUTANEOUS | Status: DC
  Administered 2019-12-02 – 2019-12-03 (×2): 40 mg via SUBCUTANEOUS
  Filled 2019-12-02 (×2): qty 0.4

## 2019-12-02 MED ORDER — PANTOPRAZOLE SODIUM 40 MG PO TBEC
40.0000 mg | DELAYED_RELEASE_TABLET | Freq: Two times a day (BID) | ORAL | Status: DC
  Administered 2019-12-02 – 2019-12-04 (×4): 40 mg via ORAL
  Filled 2019-12-02 (×4): qty 1

## 2019-12-02 MED ORDER — ALUM & MAG HYDROXIDE-SIMETH 200-200-20 MG/5ML PO SUSP: 15.0000 mL | Freq: Four times a day (QID) | ORAL | Status: DC | PRN

## 2019-12-02 MED ORDER — ALBUTEROL SULFATE (2.5 MG/3ML) 0.083% IN NEBU
INHALATION_SOLUTION | RESPIRATORY_TRACT | Status: AC
  Filled 2019-12-02: qty 3

## 2019-12-02 MED ORDER — ACETAMINOPHEN 500 MG PO TABS: 1000.0000 mg | ORAL_TABLET | Freq: Three times a day (TID) | ORAL | Status: DC | PRN

## 2019-12-02 MED ORDER — BUDESONIDE 0.25 MG/2ML IN SUSP
0.2500 mg | Freq: Two times a day (BID) | RESPIRATORY_TRACT | Status: DC
  Administered 2019-12-03 – 2019-12-04 (×3): 0.25 mg via RESPIRATORY_TRACT
  Filled 2019-12-02 (×4): qty 2

## 2019-12-02 MED ORDER — MONTELUKAST SODIUM 10 MG PO TABS
10.0000 mg | ORAL_TABLET | Freq: Every day | ORAL | Status: DC
  Administered 2019-12-02 – 2019-12-03 (×2): 10 mg via ORAL
  Filled 2019-12-02 (×2): qty 1

## 2019-12-02 MED ORDER — IPRATROPIUM BROMIDE 0.02 % IN SOLN
0.5000 mg | Freq: Four times a day (QID) | RESPIRATORY_TRACT | Status: DC
  Administered 2019-12-02 – 2019-12-03 (×2): 0.5 mg via RESPIRATORY_TRACT
  Filled 2019-12-02 (×2): qty 2.5

## 2019-12-02 MED ORDER — ONDANSETRON 4 MG PO TBDP: 4.0000 mg | ORAL_TABLET | Freq: Three times a day (TID) | ORAL | Status: DC | PRN

## 2019-12-02 MED ORDER — MAGNESIUM SULFATE 2 GM/50ML IV SOLN
2.0000 g | Freq: Once | INTRAVENOUS | Status: AC
  Administered 2019-12-02: 2 g via INTRAVENOUS
  Filled 2019-12-02: qty 50

## 2019-12-02 MED ORDER — ALPRAZOLAM 0.25 MG PO TABS
0.2500 mg | ORAL_TABLET | Freq: Two times a day (BID) | ORAL | Status: DC | PRN
  Administered 2019-12-03 (×2): 0.25 mg via ORAL
  Filled 2019-12-02 (×2): qty 1

## 2019-12-02 MED ORDER — ALBUTEROL SULFATE (2.5 MG/3ML) 0.083% IN NEBU
2.5000 mg | INHALATION_SOLUTION | Freq: Once | RESPIRATORY_TRACT | Status: AC
  Administered 2019-12-02: 2.5 mg via RESPIRATORY_TRACT

## 2019-12-02 MED ORDER — ASPIRIN EC 81 MG PO TBEC
81.0000 mg | DELAYED_RELEASE_TABLET | Freq: Every day | ORAL | Status: DC
  Administered 2019-12-03 – 2019-12-04 (×2): 81 mg via ORAL
  Filled 2019-12-02 (×2): qty 1

## 2019-12-02 NOTE — ED Provider Notes (Signed)
[facility] Emergency Department Provider Note  ____________________________________________   First MD Initiated Contact with Patient 12/02/19 1540     (approximate)  I have reviewed the triage vital signs and the nursing notes.   HISTORY  Chief Complaint Chest Pain and Shortness of Breath    HPI Cassie Ross is a 59 y.o. female here with shortness of breath and cough.  The patient has a fairly extensive history of COPD, hypertension, hyperlipidemia, CHF, here with cough and wheezing.  The patient states that intermittently for the last day, she has had shortness of breath, cough, and wheezing above her baseline.  She has an extensive history of COPD and states she has prednisone as well as doxycycline and that she takes when she gets exacerbations.  She states that around this time of the year, she often gets exacerbations due to weather changes.  She states that she has not had any fevers or chills.  She did receive her first Covid vaccination last week, denies any known Covid exposures.  No leg swelling.  No other complaints.        Past Medical History:  Diagnosis Date  . Asthma   . CHF (congestive heart failure) ([place])    1991- unknown after asthma attack  . Chicken pox   . COPD (chronic obstructive pulmonary disease) ([place])   . Diabetes ([place])   . DVT (deep venous thrombosis) ([place])   . GERD (gastroesophageal reflux disease)   . Hernia, hiatal   . HLD (hyperlipidemia)   . Hypertension   . Hypertension   . Seasonal allergies     Patient Active Problem List   Diagnosis Date Noted  . Asthma exacerbation 12/02/2019  . Sore throat 12/01/2019  . Encounter for long-term (current) use of medications 04/14/2019  . Encounter for screening mammogram for malignant neoplasm of breast 03/02/2019  . Encounter for therapeutic drug level monitoring 03/02/2019  . Gastroesophageal reflux disease without esophagitis 03/02/2019  . Mastitis in female  02/21/2019  . Severe persistent asthma without complication 56/43/3295  . Encounter for general adult medical examination with abnormal findings 10/08/2018  . Dysuria 10/08/2018  . 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 Family history of colon cancer 05/01/2018  . Pulmonary nodule 05/01/2018  . Neoplasm of uncertain behavior of skin of face 05/01/2018  . Acute upper respiratory infection 01/22/2018  . Generalized anxiety disorder 01/22/2018  . Venous stasis ulcer of left lower leg with edema of left lower leg (HCC) 12/07/2017  . Needs flu shot 12/07/2017  . Uncontrolled type 2 diabetes mellitus with hyperglycemia ([place]) 06/08/2017  . Atopic dermatitis 06/08/2017  . Severe asthma without complication 18/84/1660  . Hypokalemia 06/08/2017  . Influenza A 04/22/2016  . Essential hypertension 04/21/2016  . Diabetes ([place]) 04/21/2016  . GERD (gastroesophageal reflux disease) 04/21/2016  . Pneumonia 02/26/2016  . Severe persistent asthma with acute exacerbation 02/21/2016  . Primary osteoarthritis of left knee 12/27/2014  . DDD (degenerative disc disease), lumbar 09/28/2013  . Lumbar radiculitis 09/28/2013    Past Surgical History:  Procedure Laterality Date  . cataract surgery    . CESAREAN SECTION    . CHOLECYSTECTOMY    . COLONOSCOPY WITH PROPOFOL N/A 01/06/2018   Procedure: COLONOSCOPY WITH PROPOFOL;  Surgeon: Lollie Sails, MD;  Location: [facility] ENDOSCOPY;  Service: Endoscopy;  Laterality: N/A;  . ESOPHAGEAL DILATION    . ESOPHAGOGASTRODUODENOSCOPY (EGD) WITH PROPOFOL N/A 01/06/2018   Procedure: ESOPHAGOGASTRODUODENOSCOPY (EGD) WITH PROPOFOL;  Surgeon:  Lollie Sails, MD;  Location: [facility] ENDOSCOPY;  Service: Endoscopy;  Laterality: N/A;  . HERNIA REPAIR    . hiatial hernia repair    . NASAL SINUS SURGERY      Prior to Admission medications   Medication Sig Start Date End Date Taking? Authorizing Provider  albuterol (PROAIR HFA) 108 (90 Base)  MCG/ACT inhaler Inhale 2 puffs into the lungs every 4 (four) hours as needed for wheezing or shortness of breath. 01/22/18  Yes Boscia, Heather E, NP  albuterol (PROVENTIL) (2.5 MG/3ML) 0.083% nebulizer solution USE 1 VIAL IN NEBULIZER EVERY 6 HOURS AS NEEDED Patient taking differently: Take 2.5 mg by nebulization every 6 (six) hours as needed. USE 1 VIAL IN NEBULIZER EVERY 6 HOURS AS NEEDED 10/04/19  Yes [place], Audie Clear, NP  ALPRAZolam (XANAX) 0.5 MG tablet TAKE 1/2 (ONE-HALF) TABLET BY MOUTH IN THE MORNING AND TAKE 1 TABLET IN THE EVENING. Patient taking differently: Take 0.25-0.5 mg by mouth 2 (two) times daily. TAKE 1/2 (ONE-HALF) TABLET BY MOUTH IN THE MORNING AND TAKE 1 TABLET IN THE EVENING. 09/06/19  Yes Boscia, Heather E, NP  aspirin EC 81 MG tablet Take 81 mg by mouth daily.   Yes [provider]  atorvastatin (LIPITOR) 10 MG tablet Take 1 tablet (10 mg total) by mouth daily. 05/18/19  Yes Boscia, Greer Ee, NP  diltiazem (CARDIZEM CD) 180 MG 24 hr capsule Take 1 capsule (180 mg total) by mouth 2 (two) times daily. 10/04/19  Yes Boscia, Greer Ee, NP  doxycycline (VIBRA-TABS) 100 MG tablet Take 1 tablet (100 mg total) by mouth 2 (two) times daily. 11/12/19  Yes Boscia, Heather E, NP  fluticasone (FLONASE) 50 MCG/ACT nasal spray Place 1 spray into both nostrils every morning.   Yes [provider]  furosemide (LASIX) 80 MG tablet Take 80 mg by mouth 2 (two) times daily.   Yes [provider]  insulin regular (HUMULIN R) 100 units/mL injection Sliding scale - use as directed QAC per sliding scale instructions. Max daily dose is 25 units in 24 hours. E11.65 10/14/19  Yes Lavera Guise, MD  ipratropium-albuterol (DUONEB) 0.5-2.5 (3) MG/3ML SOLN Take 3 mLs by nebulization every 4 (four) hours as needed for wheezing. 10/12/19  Yes [provider]  montelukast (SINGULAIR) 10 MG tablet Take 1 tablet (10 mg total) by mouth at bedtime. 11/29/19  Yes Boscia, Greer Ee, NP    Multiple Vitamins-Minerals (ONE-A-DAY MENOPAUSE FORMULA PO) Take 1 tablet by mouth daily.   Yes [provider]  pantoprazole (PROTONIX) 40 MG tablet Take 40 mg by mouth 2 (two) times daily.    Yes [provider]  potassium chloride (KLOR-CON) 10 MEQ tablet Take 1 tablet (10 mEq total) by mouth 2 (two) times daily. 07/29/19  Yes Boscia, Greer Ee, NP  theophylline (THEODUR) 300 MG 12 hr tablet Take 1 tablet (300 mg total) by mouth 2 (two) times daily. 09/06/19  Yes Boscia, Greer Ee, NP  acetaminophen (TYLENOL) 500 MG tablet Take 1,000 mg by mouth every 8 (eight) hours as needed for moderate pain.     [provider]  Blood Glucose Monitoring Suppl (ONETOUCH VERIO) w/Device KIT Use as directed diag 05/19/18   Ronnell Freshwater, NP  budesonide-formoterol (SYMBICORT) 160-4.5 MCG/ACT inhaler Inhale 1 puff into the lungs 2 (two) times daily. Patient not taking: Reported on 12/02/2019    [provider]  Chlorpheniramine-APAP (CORICIDIN) 2-325 MG TABS Take 2 tablets by mouth daily as needed (cold symptoms).  [provider]  dapagliflozin propanediol (FARXIGA) 5 MG TABS tablet Take 1 tablet (5 mg total) by mouth daily before breakfast. For dm Patient not taking: Reported on 12/02/2019 10/11/19   Lavera Guise, MD  Fexofenadine-Pseudoephedrine (ALLEGRA-D 24 HOUR PO) Take by mouth at bedtime.    [provider]  ibuprofen (ADVIL,MOTRIN) 200 MG tablet Take 200 mg by mouth every 8 (eight) hours as needed (pain).     [provider]  magic mouthwash w/lidocaine SOLN Take 5 mLs by mouth 4 (four) times daily as needed for mouth pain. 11/12/19   Boscia, Greer Ee, NP  Semaglutide, 1 MG/DOSE, (OZEMPIC, 1 MG/DOSE,) 2 MG/1.5ML SOPN Inject 0.75 mLs (1 mg total) into the skin once a week. Patient not taking: Reported on 12/02/2019 10/11/19   Lavera Guise, MD    Allergies Shellfish allergy, Sunflower oil, Levaquin [levofloxacin], and Penicillins  Family  History  Problem Relation Age of Onset  . Hypertension Mother     Social History Social History   Tobacco Use  . Smoking status: Never Smoker  . Smokeless tobacco: Never Used  Substance Use Topics  . Alcohol use: Yes    Comment: glass of wine ocassionally   . Drug use: No    Review of Systems  Review of Systems  Constitutional: Positive for fatigue. Negative for fever.  HENT: Negative for congestion and sore throat.   Eyes: Negative for visual disturbance.  Respiratory: Positive for cough, shortness of breath and wheezing.   Cardiovascular: Negative for chest pain.  Gastrointestinal: Negative for abdominal pain, diarrhea, nausea and vomiting.  Genitourinary: Negative for flank pain.  Musculoskeletal: Negative for back pain and neck pain.  Skin: Negative for rash and wound.  All other systems reviewed and are negative.    ____________________________________________  PHYSICAL EXAM:      VITAL SIGNS: ED Triage Vitals  Enc Vitals Group     BP 12/02/19 1305 (!) 142/112     Pulse Rate 12/02/19 1305 (!) 128     Resp 12/02/19 1305 20     Temp 12/02/19 1305 98.1 F (36.7 C)     Temp Source 12/02/19 1305 Oral     SpO2 12/02/19 1305 95 %     Weight 12/02/19 1305 199 lb (90.3 kg)     Height 12/02/19 1305 5' 3"  (1.6 m)     Head Circumference --      Peak Flow --      Pain Score 12/02/19 1304 4     Pain Loc --      Pain Edu? --      Excl. in [place]? --      Physical Exam Vitals and nursing note reviewed.  Constitutional:      General: She is not in acute distress.    Appearance: She is well-developed.  HENT:     Head: Normocephalic and atraumatic.  Eyes:     Conjunctiva/sclera: Conjunctivae normal.  Cardiovascular:     Rate and Rhythm: Normal rate and regular rhythm.     Heart sounds: Normal heart sounds. No murmur heard.  No friction rub.  Pulmonary:     Effort: Pulmonary effort is normal. No respiratory distress.     Breath sounds: Examination of the  right-upper field reveals wheezing. Examination of the left-upper field reveals wheezing. Examination of the right-middle field reveals wheezing. Examination of the left-middle field reveals wheezing. Examination of the right-lower field reveals wheezing. Examination of the left-lower field reveals wheezing. Decreased breath sounds and  wheezing present. No rales.  Abdominal:     General: There is no distension.     Palpations: Abdomen is soft.     Tenderness: There is no abdominal tenderness.  Musculoskeletal:     Cervical back: Neck supple.  Skin:    General: Skin is warm.     Capillary Refill: Capillary refill takes less than 2 seconds.  Neurological:     Mental Status: She is alert and oriented to person, place, and time.     Motor: No abnormal muscle tone.       ____________________________________________   LABS (all labs ordered are listed, but only abnormal results are displayed)  Labs Reviewed  BASIC METABOLIC PANEL - Abnormal; Notable for the following components:      Result Value   Glucose, Bld 149 (*)    Calcium 8.8 (*)    All other components within normal limits  CBC - Abnormal; Notable for the following components:   WBC 11.0 (*)    All other components within normal limits  BLOOD GAS, VENOUS - Abnormal; Notable for the following components:   pCO2, Ven 42 (*)    All other components within normal limits  RESPIRATORY PANEL BY RT PCR (FLU A&B, COVID)  HIV ANTIBODY (ROUTINE TESTING W REFLEX)  BASIC METABOLIC PANEL  CBC  MAGNESIUM  POC URINE PREG, ED  TROPONIN I (HIGH SENSITIVITY)  TROPONIN I (HIGH SENSITIVITY)    ____________________________________________  EKG: Sinus tachycardia, ventricular rate 130.  PR 158, QRS 72, QTc 420.  No acute ST elevations or depressions. ________________________________________  RADIOLOGY All imaging, including plain films, CT scans, and ultrasounds, independently reviewed by me, and interpretations confirmed via formal  radiology reads.  ED MD interpretation:   Chest x-ray: No acute abnormality  Official radiology report(s): DG Chest 2 View  Result Date: 12/02/2019 CLINICAL DATA:  Chest pain. EXAM: CHEST - 2 VIEW COMPARISON:  10/11/2019. FINDINGS: Mediastinum and hilar structures normal. Heart size normal. Prominent cardiac fat pad noted. No focal infiltrate. No pleural effusion or pneumothorax. Degenerative change thoracic spine. IMPRESSION: No acute cardiopulmonary disease. Electronically Signed   By: Marcello Moores  Register   On: 12/02/2019 13:37    ____________________________________________  PROCEDURES   Procedure(s) performed (including Critical Care):  Procedures  ____________________________________________  INITIAL IMPRESSION / MDM / [place] / ED COURSE  As part of my medical decision making, I reviewed the following data within the [place] notes reviewed and incorporated, Old chart reviewed, Notes from prior ED visits, and [place] Controlled Substance Database       *ICIS BUDREAU was evaluated in Emergency Department on 12/02/2019 for the symptoms described in the history of present illness. She was evaluated in the context of the global COVID-19 pandemic, which necessitated consideration that the patient might be at risk for infection with the SARS-CoV-2 virus that causes COVID-19. Institutional protocols and algorithms that pertain to the evaluation of patients at risk for COVID-19 are in a state of rapid change based on information released by regulatory bodies including the CDC and federal and state organizations. These policies and algorithms were followed during the patient's care in the ED.  Some ED evaluations and interventions may be delayed as a result of limited staffing during the pandemic.*     Medical Decision Making:  59 yo F here with wheezing, SOB likely 2/2 COPD exacerbation. Pt with increased WOB, retractions on arrival that responded well  to duonebs x 3. IV steroids, mag given.  Pt remains hypoxic despite tx as above, will admit.  ____________________________________________  FINAL CLINICAL IMPRESSION(S) / ED DIAGNOSES  Final diagnoses:  COPD exacerbation ([place])     MEDICATIONS GIVEN DURING THIS VISIT:  Medications  enoxaparin (LOVENOX) injection 40 mg (has no administration in time range)  aspirin EC tablet 81 mg (has no administration in time range)  atorvastatin (LIPITOR) tablet 10 mg (has no administration in time range)  diltiazem (CARDIZEM CD) 24 hr capsule 180 mg (180 mg Oral Given 12/02/19 1735)  ALPRAZolam (XANAX) tablet 0.25 mg (has no administration in time range)  pantoprazole (PROTONIX) EC tablet 40 mg (has no administration in time range)  montelukast (SINGULAIR) tablet 10 mg (has no administration in time range)  theophylline (THEODUR) 12 hr tablet 300 mg (has no administration in time range)  acetaminophen (TYLENOL) tablet 1,000 mg (has no administration in time range)  alum & mag hydroxide-simeth (MAALOX/MYLANTA) 200-200-20 MG/5ML suspension 15 mL (has no administration in time range)  calcium carbonate (TUMS - dosed in mg elemental calcium) chewable tablet 200 mg of elemental calcium (has no administration in time range)  docusate sodium (COLACE) capsule 100 mg (has no administration in time range)  guaiFENesin-dextromethorphan (ROBITUSSIN DM) 100-10 MG/5ML syrup 10 mL (has no administration in time range)  polyethylene glycol (MIRALAX / GLYCOLAX) packet 17 g (has no administration in time range)  ondansetron (ZOFRAN-ODT) disintegrating tablet 4 mg (has no administration in time range)  ondansetron (ZOFRAN) injection 4 mg (has no administration in time range)  insulin aspart (novoLOG) injection 0-20 Units (4 Units Subcutaneous Given 12/02/19 1741)  insulin aspart (novoLOG) injection 0-5 Units (has no administration in time range)  levalbuterol (XOPENEX) nebulizer solution 0.63 mg (0.63 mg Nebulization  Given 12/02/19 1833)  ipratropium (ATROVENT) nebulizer solution 0.5 mg (0.5 mg Nebulization Given 12/02/19 1834)  predniSONE (DELTASONE) tablet 40 mg (has no administration in time range)  ipratropium-albuterol (DUONEB) 0.5-2.5 (3) MG/3ML nebulizer solution 3 mL (3 mLs Nebulization Given 12/02/19 1356)  ipratropium-albuterol (DUONEB) 0.5-2.5 (3) MG/3ML nebulizer solution 3 mL (3 mLs Nebulization Given 12/02/19 1356)  ipratropium-albuterol (DUONEB) 0.5-2.5 (3) MG/3ML nebulizer solution 3 mL (3 mLs Nebulization Given 12/02/19 1356)  methylPREDNISolone sodium succinate (SOLU-MEDROL) 125 mg/2 mL injection 125 mg (125 mg Intravenous Given 12/02/19 1356)  magnesium sulfate IVPB 2 g 50 mL (0 g Intravenous Stopped 12/02/19 1412)     ED Discharge Orders    None       Note:  This document was prepared using Dragon voice recognition software and may include unintentional dictation errors.   Duffy Bruce, MD 12/02/19 2011

## 2019-12-02 NOTE — ED Triage Notes (Addendum)
Says she has heart racing all day today.  Says she has been doing the nebs for asthma, but the heart rate never comes down after the albuterol.  She does have wheezing when she talks, but she is able to speak in sentences.  She says she does have chest pressure.  3 liters oxygen on as ems states her sat drops without it.

## 2019-12-02 NOTE — ED Notes (Signed)
Pt's oxygen saturation dropped to 86% on room air after attempting to wean patient from supplemental oxygen. Pt placed back on 2LNC at this time.

## 2019-12-02 NOTE — ED Triage Notes (Signed)
Pt in via EMS from home with c/o difficulty breathing since 6am. HR 130. Pt took prescribed meds with little relief.

## 2019-12-02 NOTE — H&P (Signed)
History and Physical    Cassie Ross VQX:[id number] DOB: [birth year] DOA: 12/02/2019  PCP: Lavera Guise, MD  Patient coming from: home  I have personally briefly reviewed patient's old medical records in [place]  Chief Complaint: dyspnea   HPI: Cassie Ross is a 59 y.o. female with medical history significant of asthma uncontrolled, DM2 on insulin, HTN who presented to the ED with increased cough with sputum production and dyspnea.  Pt reported 1-2 weeks of increasing cough with production of white sputum.  This morning, pt became more dyspneic, and gave herself 2 DuoNeb at home which didn't help.  Pt also experienced heart racing.  No fever, chest pain, abdominal pain, N/V/D, dysuria, increased swelling.  Pt had completed Z-pak and doxycycline by her outpatient provider for complaints of "nasal congestion, headache, and sore/scratchy throat."  Pt reported no personal smoking hx but 2nd-hand exposure before she was 59 years old.  Pt does have frequent asthma exacerbations and frequent prednisone taper courses.  Pt follows with outpatient pulm and had Symbicort on her med list, but pt reported not taking any inhaler at home except for albuterol.     ED Course: initial vitals: afebrile, HR 128, BP 142/112.  EMS reported pt desats and needed 3L O2.  After DuoNeb x3 in the ED, pt O2 still dropped to 86% on room air, and was put back on 2L O2.  BMP and CBC largely remarkable except for elevated BG.  Trop neg.  COVID neg and Flu neg.  CXR showed no acute finding.     Assessment/Plan Active Problems:   Asthma exacerbation  # Acute hypoxic respiratory failure 2/2  # Asthma exacerbation likely due to seasonal allergies --In the ED, pt desat down to 86% on room air, and was put on 2L O2.  No signs of pneumonia.  COVID neg, Flu neg.   --Pt received DuoNeb x3 and IV solumedrol 125 mg in the ED. PLAN: --Xopenex QID (avoid albuterol due to tachycardia) and ipratropium QID --continue  steroid as prednisone 40 mg daily --continue home Singulair and theophylline --Upon discharge, need to make sure pt takes daily Symbicort.  # DM2 on insulin --recent A1c 8.7 about a month ago.  Pt reported taking regular insulin as sliding scale.  Also has FARXIGA and Semaglutide on home med list. PLAN: --ACHS and SSI resistant scale for now  # HTN --continue home cardizem 180 mg BID  # GERD --continue home PPI  # HLD --continue home statin   DVT prophylaxis: Lovenox SQ Code Status: Full code  Family Communication: husband updated at bedside  Disposition Plan: home  Consults called: none Admission status: Inpatient   Review of Systems: As per HPI otherwise 10 point review of systems negative.   Past Medical History:  Diagnosis Date  . Asthma   . CHF (congestive heart failure) ([place])    1991- unknown after asthma attack  . Chicken pox   . COPD (chronic obstructive pulmonary disease) ([place])   . Diabetes ([place])   . DVT (deep venous thrombosis) ([place])   . GERD (gastroesophageal reflux disease)   . Hernia, hiatal   . HLD (hyperlipidemia)   . Hypertension   . Hypertension   . Seasonal allergies     Past Surgical History:  Procedure Laterality Date  . cataract surgery    . CESAREAN SECTION    . CHOLECYSTECTOMY    . COLONOSCOPY WITH PROPOFOL N/A 01/06/2018   Procedure: COLONOSCOPY WITH PROPOFOL;  Surgeon: Gustavo Lah,  Billie Ruddy, MD;  Location: [facility] ENDOSCOPY;  Service: Endoscopy;  Laterality: N/A;  . ESOPHAGEAL DILATION    . ESOPHAGOGASTRODUODENOSCOPY (EGD) WITH PROPOFOL N/A 01/06/2018   Procedure: ESOPHAGOGASTRODUODENOSCOPY (EGD) WITH PROPOFOL;  Surgeon: Lollie Sails, MD;  Location: [facility] ENDOSCOPY;  Service: Endoscopy;  Laterality: N/A;  . HERNIA REPAIR    . hiatial hernia repair    . NASAL SINUS SURGERY       reports that she has never smoked. She has never used smokeless tobacco. She reports current alcohol use. She reports that she does not use  drugs.  Allergies  Allergen Reactions  . Penicillins     Has patient had a PCN reaction causing immediate rash, facial/tongue/throat swelling, SOB or lightheadedness with hypotension: no Has patient had a PCN reaction causing severe rash involving mucus membranes or skin necrosis: no Has patient had a PCN reaction that required hospitalization no Has patient had a PCN reaction occurring within the last 10 years: no If all of the above answers are "NO", then may proceed with Cephalosporin use.   . Shellfish Allergy Swelling  . Sunflower Oil Swelling    seeds  . Levaquin [Levofloxacin] Rash    Allergy to pill form only. Patient has had IV with no problem.    Family History  Problem Relation Age of Onset  . Hypertension Mother      Prior to Admission medications   Medication Sig Start Date End Date Taking? Authorizing Provider  acetaminophen (TYLENOL) 500 MG tablet Take 1,000 mg by mouth every 8 (eight) hours as needed for moderate pain.     [provider]  albuterol (PROAIR HFA) 108 (90 Base) MCG/ACT inhaler Inhale 2 puffs into the lungs every 4 (four) hours as needed for wheezing or shortness of breath. 01/22/18   Ronnell Freshwater, NP  albuterol (PROVENTIL) (2.5 MG/3ML) 0.083% nebulizer solution USE 1 VIAL IN NEBULIZER EVERY 6 HOURS AS NEEDED 10/04/19   Scarboro, Audie Clear, NP  ALPRAZolam Duanne Moron) 0.5 MG tablet TAKE 1/2 (ONE-HALF) TABLET BY MOUTH IN THE MORNING AND TAKE 1 TABLET IN THE EVENING. 09/06/19   Ronnell Freshwater, NP  aspirin EC 81 MG tablet Take 81 mg by mouth daily.    [provider]  atorvastatin (LIPITOR) 10 MG tablet Take 1 tablet (10 mg total) by mouth daily. 05/18/19   Ronnell Freshwater, NP  Blood Glucose Monitoring Suppl (ONETOUCH VERIO) w/Device KIT Use as directed diag 05/19/18   Ronnell Freshwater, NP  budesonide-formoterol (SYMBICORT) 160-4.5 MCG/ACT inhaler Inhale 1 puff into the lungs 2 (two) times daily.    [provider]   Chlorpheniramine-APAP (CORICIDIN) 2-325 MG TABS Take 2 tablets by mouth daily as needed (cold symptoms).     [provider]  dapagliflozin propanediol (FARXIGA) 5 MG TABS tablet Take 1 tablet (5 mg total) by mouth daily before breakfast. For dm 10/11/19   Lavera Guise, MD  diltiazem (CARDIZEM CD) 180 MG 24 hr capsule Take 1 capsule (180 mg total) by mouth 2 (two) times daily. 10/04/19   Ronnell Freshwater, NP  doxycycline (VIBRA-TABS) 100 MG tablet Take 1 tablet (100 mg total) by mouth 2 (two) times daily. 11/12/19   Boscia, Greer Ee, NP  Fexofenadine-Pseudoephedrine (ALLEGRA-D 24 HOUR PO) Take by mouth at bedtime.    [provider]  fluticasone (FLONASE) 50 MCG/ACT nasal spray Place 1 spray into both nostrils every morning.    [provider]  furosemide (LASIX) 80 MG tablet Take 80  mg by mouth 2 (two) times daily.    [provider]  glucose blood (ONETOUCH VERIO) test strip Use as instructed three times daily diag e11.65 05/19/18   Ronnell Freshwater, NP  ibuprofen (ADVIL,MOTRIN) 200 MG tablet Take 200 mg by mouth every 8 (eight) hours as needed (pain).     [provider]  insulin regular (HUMULIN R) 100 units/mL injection Sliding scale - use as directed QAC per sliding scale instructions. Max daily dose is 25 units in 24 hours. E11.65 10/14/19   Lavera Guise, MD  magic mouthwash w/lidocaine SOLN Take 5 mLs by mouth 4 (four) times daily as needed for mouth pain. 11/12/19   Ronnell Freshwater, NP  montelukast (SINGULAIR) 10 MG tablet Take 1 tablet (10 mg total) by mouth at bedtime. 11/29/19   Ronnell Freshwater, NP  Multiple Vitamins-Minerals (ONE-A-DAY MENOPAUSE FORMULA PO) Take 1 tablet by mouth daily.    [provider]  pantoprazole (PROTONIX) 40 MG tablet Take 40 mg by mouth 2 (two) times daily.     [provider]  potassium chloride (KLOR-CON) 10 MEQ tablet Take 1 tablet (10 mEq total) by mouth 2 (two) times daily. 07/29/19   Boscia,  Greer Ee, NP  Semaglutide, 1 MG/DOSE, (OZEMPIC, 1 MG/DOSE,) 2 MG/1.5ML SOPN Inject 0.75 mLs (1 mg total) into the skin once a week. 10/11/19   Lavera Guise, MD  theophylline (THEODUR) 300 MG 12 hr tablet Take 1 tablet (300 mg total) by mouth 2 (two) times daily. 09/06/19   Ronnell Freshwater, NP    Physical Exam: Vitals:   12/02/19 1600 12/02/19 1630 12/02/19 1700 12/02/19 1730  BP: (!) 159/95 (!) 166/88 (!) 155/87 (!) 142/83  Pulse: (!) 112 (!) 109 (!) 117 (!) 107  Resp:      Temp:      TempSrc:      SpO2: 96% 97% 96% 94%  Weight:      Height:        Constitutional: NAD, AAOx3 HEENT: conjunctivae and lids normal, EOMI CV: RRR tachycardic. Distal pulses +2.  No cyanosis.   RESP: mild diffuse wheezing, on 2L  GI: +BS, NTND Extremities: No effusions, edema, or tenderness in BLE SKIN: warm, dry and intact Neuro: II - XII grossly intact.  Sensation intact Psych: Normal mood and affect.  Appropriate judgement and reason   Labs on Admission: I have personally reviewed following labs and imaging studies  CBC: Recent Labs  Lab 12/02/19 1354  WBC 11.0*  HGB 14.1  HCT 40.1  MCV 88.5  PLT 130   Basic Metabolic Panel: Recent Labs  Lab 12/02/19 1354  NA 141  K 3.7  CL 105  CO2 23  GLUCOSE 149*  BUN 6  CREATININE 0.45  CALCIUM 8.8*   GFR: Estimated Creatinine Clearance: 79.8 mL/min (by C-G formula based on SCr of 0.45 mg/dL). Liver Function Tests: No results for input(s): AST, ALT, ALKPHOS, BILITOT, PROT, ALBUMIN in the last 168 hours. No results for input(s): LIPASE, AMYLASE in the last 168 hours. No results for input(s): AMMONIA in the last 168 hours. Coagulation Profile: No results for input(s): INR, PROTIME in the last 168 hours. Cardiac Enzymes: No results for input(s): CKTOTAL, CKMB, CKMBINDEX, TROPONINI in the last 168 hours. BNP (last 3 results) No results for input(s): PROBNP in the last 8760 hours. HbA1C: No results for input(s): HGBA1C in the last 72  hours. CBG: No results for input(s): GLUCAP in the last 168 hours. Lipid  Profile: No results for input(s): CHOL, HDL, LDLCALC, TRIG, CHOLHDL, LDLDIRECT in the last 72 hours. Thyroid Function Tests: No results for input(s): TSH, T4TOTAL, FREET4, T3FREE, THYROIDAB in the last 72 hours. Anemia Panel: No results for input(s): VITAMINB12, FOLATE, FERRITIN, TIBC, IRON, RETICCTPCT in the last 72 hours. Urine analysis:    Component Value Date/Time   COLORURINE Yellow 07/09/2012 2150   APPEARANCEUR Clear 12/18/2018 0804   LABSPEC 1.012 07/09/2012 2150   PHURINE 5.0 07/09/2012 2150   GLUCOSEU Negative 12/18/2018 0804   GLUCOSEU Negative 07/09/2012 2150   HGBUR Negative 07/09/2012 2150   BILIRUBINUR Negative 12/18/2018 0804   BILIRUBINUR Negative 07/09/2012 2150   KETONESUR Negative 07/09/2012 2150   PROTEINUR Negative 12/18/2018 0804   PROTEINUR Negative 07/09/2012 2150   NITRITE Negative 12/18/2018 0804   NITRITE Negative 07/09/2012 2150   LEUKOCYTESUR 3+ (A) 12/18/2018 0804   LEUKOCYTESUR Trace 07/09/2012 2150    Radiological Exams on Admission: DG Chest 2 View  Result Date: 12/02/2019 CLINICAL DATA:  Chest pain. EXAM: CHEST - 2 VIEW COMPARISON:  10/11/2019. FINDINGS: Mediastinum and hilar structures normal. Heart size normal. Prominent cardiac fat pad noted. No focal infiltrate. No pleural effusion or pneumothorax. Degenerative change thoracic spine. IMPRESSION: No acute cardiopulmonary disease. Electronically Signed   By: Mer Rouge   On: 12/02/2019 13:37      Enzo Bi MD [facility]  If 7PM-7AM, please contact night-coverage 12/02/2019, 5:52 PM

## 2019-12-03 LAB — BASIC METABOLIC PANEL
Anion gap: 11 (ref 5–15)
BUN: 14 mg/dL (ref 6–20)
CO2: 23 mmol/L (ref 22–32)
Calcium: 9.5 mg/dL (ref 8.9–10.3)
Chloride: 101 mmol/L (ref 98–111)
Creatinine, Ser: 0.68 mg/dL (ref 0.44–1.00)
GFR calc Af Amer: >60 mL/min (ref >60)
GFR calc non Af Amer: >60 mL/min (ref >60)
Glucose, Bld: 341 mg/dL — ABNORMAL HIGH (ref 70–99)
Potassium: 4.3 mmol/L (ref 3.5–5.1)
Sodium: 135 mmol/L (ref 135–145)

## 2019-12-03 LAB — CBC
HCT: 40.9 % (ref 36.0–46.0)
Hemoglobin: 14 g/dL (ref 12.0–15.0)
MCH: 31.3 pg (ref 26.0–34.0)
MCHC: 34.2 g/dL (ref 30.0–36.0)
MCV: 91.3 fL (ref 80.0–100.0)
Platelets: 326 10*3/uL (ref 150–400)
RBC: 4.48 MIL/uL (ref 3.87–5.11)
RDW: 13.5 % (ref 11.5–15.5)
WBC: 12.6 10*3/uL — ABNORMAL HIGH (ref 4.0–10.5)
nRBC: 0 % (ref 0.0–0.2)

## 2019-12-03 LAB — HIV ANTIBODY (ROUTINE TESTING W REFLEX): HIV Screen 4th Generation wRfx: NONREACTIVE

## 2019-12-03 LAB — GLUCOSE, CAPILLARY
Glucose-Capillary: 189 mg/dL — ABNORMAL HIGH (ref 70–99)
Glucose-Capillary: 271 mg/dL — ABNORMAL HIGH (ref 70–99)
Glucose-Capillary: 197 mg/dL — ABNORMAL HIGH (ref 70–99)
Glucose-Capillary: 298 mg/dL — ABNORMAL HIGH (ref 70–99)
Glucose-Capillary: 141 mg/dL — ABNORMAL HIGH (ref 70–99)

## 2019-12-03 LAB — MAGNESIUM: Magnesium: 2.3 mg/dL (ref 1.7–2.4)

## 2019-12-03 MED ORDER — IPRATROPIUM-ALBUTEROL 0.5-2.5 (3) MG/3ML IN SOLN: 3.0000 mL | RESPIRATORY_TRACT | Status: DC | PRN

## 2019-12-03 MED ORDER — IPRATROPIUM BROMIDE 0.02 % IN SOLN
0.5000 mg | Freq: Four times a day (QID) | RESPIRATORY_TRACT | Status: DC
  Administered 2019-12-03 – 2019-12-04 (×3): 0.5 mg via RESPIRATORY_TRACT
  Filled 2019-12-03 (×3): qty 2.5

## 2019-12-03 MED ORDER — INSULIN DETEMIR 100 UNIT/ML ~~LOC~~ SOLN
10.0000 [IU] | Freq: Every day | SUBCUTANEOUS | Status: DC
  Administered 2019-12-03 – 2019-12-04 (×2): 10 [IU] via SUBCUTANEOUS
  Filled 2019-12-03 (×2): qty 0.1

## 2019-12-03 MED ORDER — INFLUENZA VAC SPLIT QUAD 0.5 ML IM SUSY: 0.5000 mL | PREFILLED_SYRINGE | INTRAMUSCULAR | Status: DC | PRN

## 2019-12-03 MED ORDER — LEVALBUTEROL HCL 0.63 MG/3ML IN NEBU
0.6300 mg | INHALATION_SOLUTION | Freq: Four times a day (QID) | RESPIRATORY_TRACT | Status: DC
  Administered 2019-12-03 – 2019-12-04 (×4): 0.63 mg via RESPIRATORY_TRACT
  Filled 2019-12-03 (×6): qty 3

## 2019-12-03 MED ORDER — GUAIFENESIN ER 600 MG PO TB12
600.0000 mg | ORAL_TABLET | Freq: Two times a day (BID) | ORAL | Status: DC
  Administered 2019-12-03 – 2019-12-04 (×3): 600 mg via ORAL
  Filled 2019-12-03 (×3): qty 1

## 2019-12-03 MED ORDER — LEVALBUTEROL HCL 0.63 MG/3ML IN NEBU
0.6300 mg | INHALATION_SOLUTION | Freq: Four times a day (QID) | RESPIRATORY_TRACT | Status: DC | PRN
  Filled 2019-12-03: qty 3

## 2019-12-03 NOTE — Progress Notes (Signed)
   12/03/19 0024  Assess: MEWS Score  Temp 98.4 F (36.9 C)  BP (!) 141/87  Pulse Rate (!) 120  Resp 20  Level of Consciousness Alert  SpO2 96 %  O2 Device Nasal Cannula  O2 Flow Rate (L/min) 2 L/min  Assess: MEWS Score  MEWS Temp 0  MEWS Systolic 0  MEWS Pulse 2  MEWS RR 0  MEWS LOC 0  MEWS Score 2  MEWS Score Color Yellow  Assess: if the MEWS score is Yellow or Red  Were vital signs taken at a resting state? Yes  Focused Assessment No change from prior assessment  Early Detection of Sepsis Score *See Row Information* Low  MEWS guidelines implemented *See Row Information* No, previously yellow, continue vital signs every 4 hours  Treat  Pain Scale 0-10  Pain Score 0   No further interventions needed. Pt has been tachycardic (nebs induced)  and scored YELLOW previously. Will continue Q4 VS.

## 2019-12-03 NOTE — Progress Notes (Signed)
PROGRESS NOTE    Cassie Ross  ZES:[id number] DOB: 1960/08/29 DOA: 12/02/2019 PCP: Lavera Guise, MD    Assessment & Plan:   Active Problems:   Asthma exacerbation    Cassie Ross is a 59 y.o. female with medical history significant of asthma uncontrolled, DM2 on insulin, HTN who presented to the ED with increased cough with sputum production and dyspnea.   # Acute hypoxic respiratory failure 2/2  # Acute Asthma exacerbation likely due to seasonal allergies --In the ED, pt desat down to 86% on room air, and was put on 2L O2.  No signs of pneumonia.  COVID neg, Flu neg.   --Pt received DuoNeb x3 and IV solumedrol 125 mg in the ED but still hypoxic without supplemental O2. PLAN: --Xopenex (avoid albuterol due to tachycardia) and ipratropium q6h --continue steroid as prednisone 40 mg daily --continue home Singulair and theophylline --flutter valve and mucinex BID --Upon discharge, need to make sure pt takes daily Symbicort.  Will need TOC to set pt up with medication assistance program.  # DM2 on insulin --recent A1c 8.7 about a month ago.  Pt reported taking regular insulin as sliding scale.  Also has FARXIGA and Semaglutide on home med list. PLAN: --start Levemir 10u daily --ACHS and SSI resistant scale for now  # HTN --continue home cardizem 180 mg BID  # GERD --continue home PPI  # HLD --continue home statin    DVT prophylaxis: Lovenox SQ Code Status: Full code  Family Communication:  Status is: inpatient Dispo:   The patient is from: home Anticipated d/c is to: home Anticipated d/c date is: 1-2 days Patient currently is not medically stable to d/c due to: acute asthma exacerbation, still need 2L O2, should be off O2 before discharge.   Subjective and Interval History:  Pt reported breathing better, felt sputum starting to loosen.     Objective: Vitals:   12/03/19 0746 12/03/19 0821 12/03/19 1155 12/03/19 1450  BP:  135/72 133/76   Pulse:  (!) 104 (!) 105 93 100  Resp: (!) 22 20 20 18   Temp:  98.5 F (36.9 C) 98.5 F (36.9 C)   TempSrc:  Oral Oral   SpO2: 93% 97% 94% 92%  Weight:      Height:        Intake/Output Summary (Last 24 hours) at 12/03/2019 1802 Last data filed at 12/03/2019 0930 Gross per 24 hour  Intake 598 ml  Output 300 ml  Net 298 ml   Filed Weights   12/02/19 1305 12/03/19 0024  Weight: 90.3 kg 86.9 kg    Examination:   Constitutional: NAD, AAOx3 HEENT: conjunctivae and lids normal, EOMI CV: RRR no M,R,G. Distal pulses +2.  No cyanosis.   RESP: no wheezes today, on 2L GI: +BS, NTND Extremities: No effusions, edema, or tenderness in BLE SKIN: warm, dry and intact Neuro: II - XII grossly intact.  Sensation intact Psych: Normal mood and affect.  Appropriate judgement and reason   Data Reviewed: I have personally reviewed following labs and imaging studies  CBC: Recent Labs  Lab 12/02/19 1354 12/03/19 0510  WBC 11.0* 12.6*  HGB 14.1 14.0  HCT 40.1 40.9  MCV 88.5 91.3  PLT 314 263   Basic Metabolic Panel: Recent Labs  Lab 12/02/19 1354 12/03/19 0510  NA 141 135  K 3.7 4.3  CL 105 101  CO2 23 23  GLUCOSE 149* 341*  BUN 6 14  CREATININE 0.45 0.68  CALCIUM 8.8*  9.5  MG  --  2.3   GFR: Estimated Creatinine Clearance: 78.2 mL/min (by C-G formula based on SCr of 0.68 mg/dL). Liver Function Tests: No results for input(s): AST, ALT, ALKPHOS, BILITOT, PROT, ALBUMIN in the last 168 hours. No results for input(s): LIPASE, AMYLASE in the last 168 hours. No results for input(s): AMMONIA in the last 168 hours. Coagulation Profile: No results for input(s): INR, PROTIME in the last 168 hours. Cardiac Enzymes: No results for input(s): CKTOTAL, CKMB, CKMBINDEX, TROPONINI in the last 168 hours. BNP (last 3 results) No results for input(s): PROBNP in the last 8760 hours. HbA1C: No results for input(s): HGBA1C in the last 72 hours. CBG: Recent Labs  Lab 12/02/19 2256 12/03/19 0739  12/03/19 1156 12/03/19 1638  GLUCAP 270* 271* 197* 298*   Lipid Profile: No results for input(s): CHOL, HDL, LDLCALC, TRIG, CHOLHDL, LDLDIRECT in the last 72 hours. Thyroid Function Tests: No results for input(s): TSH, T4TOTAL, FREET4, T3FREE, THYROIDAB in the last 72 hours. Anemia Panel: No results for input(s): VITAMINB12, FOLATE, FERRITIN, TIBC, IRON, RETICCTPCT in the last 72 hours. Sepsis Labs: No results for input(s): PROCALCITON, LATICACIDVEN in the last 168 hours.  Recent Results (from the past 240 hour(s))  Respiratory Panel by RT PCR (Flu A&B, Covid) - Nasopharyngeal Swab     Status: None   Collection Time: 12/02/19  1:55 PM   Specimen: Nasopharyngeal Swab  Result Value Ref Range Status   SARS Coronavirus 2 by RT PCR NEGATIVE NEGATIVE Final    Comment: (NOTE) SARS-CoV-2 target nucleic acids are NOT DETECTED.  The SARS-CoV-2 RNA is generally detectable in upper respiratoy specimens during the acute phase of infection. The lowest concentration of SARS-CoV-2 viral copies this assay can detect is 131 copies/mL. A negative result does not preclude SARS-Cov-2 infection and should not be used as the sole basis for treatment or other patient management decisions. A negative result may occur with  improper specimen collection/handling, submission of specimen other than nasopharyngeal swab, presence of viral mutation(s) within the areas targeted by this assay, and inadequate number of viral copies (<131 copies/mL). A negative result must be combined with clinical observations, patient history, and epidemiological information. The expected result is Negative.  Fact Sheet for Patients:  PinkCheek.be  Fact Sheet for Healthcare Providers:  GravelBags.it  This test is no t yet approved or cleared by the [place] FDA and  has been authorized for detection and/or diagnosis of SARS-CoV-2 by FDA under an Emergency Use  Authorization (EUA). This EUA will remain  in effect (meaning this test can be used) for the duration of the COVID-19 declaration under Section 564(b)(1) of the Act, 21 U.S.C. section 360bbb-3(b)(1), unless the authorization is terminated or revoked sooner.     Influenza A by PCR NEGATIVE NEGATIVE Final   Influenza B by PCR NEGATIVE NEGATIVE Final    Comment: (NOTE) The Xpert Xpress SARS-CoV-2/FLU/RSV assay is intended as an aid in  the diagnosis of influenza from Nasopharyngeal swab specimens and  should not be used as a sole basis for treatment. Nasal washings and  aspirates are unacceptable for Xpert Xpress SARS-CoV-2/FLU/RSV  testing.  Fact Sheet for Patients: PinkCheek.be  Fact Sheet for Healthcare Providers: GravelBags.it  This test is not yet approved or cleared by the [place] FDA and  has been authorized for detection and/or diagnosis of SARS-CoV-2 by  FDA under an Emergency Use Authorization (EUA). This EUA will remain  in effect (meaning this test can be used) for the  duration of the  Covid-19 declaration under Section 564(b)(1) of the Act, 21  U.S.C. section 360bbb-3(b)(1), unless the authorization is  terminated or revoked. Performed at [facility], [address] [place], [place] [address]       Radiology Studies: DG Chest 2 View  Result Date: 12/02/2019 CLINICAL DATA:  Chest pain. EXAM: CHEST - 2 VIEW COMPARISON:  10/11/2019. FINDINGS: Mediastinum and hilar structures normal. Heart size normal. Prominent cardiac fat pad noted. No focal infiltrate. No pleural effusion or pneumothorax. Degenerative change thoracic spine. IMPRESSION: No acute cardiopulmonary disease. Electronically Signed   By: Marcello Moores  Register   On: 12/02/2019 13:37     Scheduled Meds: . aspirin EC  81 mg Oral Daily  . atorvastatin  10 mg Oral Daily  . budesonide (PULMICORT) nebulizer solution  0.25 mg Nebulization BID    . diltiazem  180 mg Oral BID  . enoxaparin (LOVENOX) injection  40 mg Subcutaneous Q24H  . guaiFENesin  600 mg Oral BID  . insulin aspart  0-20 Units Subcutaneous TID WC  . insulin aspart  0-5 Units Subcutaneous QHS  . insulin detemir  10 Units Subcutaneous Daily  . ipratropium  0.5 mg Nebulization Q6H  . levalbuterol  0.63 mg Nebulization Q6H  . montelukast  10 mg Oral QHS  . pantoprazole  40 mg Oral BID  . predniSONE  40 mg Oral Q breakfast  . theophylline  300 mg Oral BID   Continuous Infusions:   LOS: 1 day     Enzo Bi, MD [facility] If 7PM-7AM, please contact night-coverage 12/03/2019, 6:02 PM

## 2019-12-04 DIAGNOSIS — J45901 Unspecified asthma with (acute) exacerbation: Principal | ICD-10-CM

## 2019-12-04 LAB — CBC
HCT: 38.4 % (ref 36.0–46.0)
Hemoglobin: 13.1 g/dL (ref 12.0–15.0)
MCH: 31.3 pg (ref 26.0–34.0)
MCHC: 34.1 g/dL (ref 30.0–36.0)
MCV: 91.6 fL (ref 80.0–100.0)
Platelets: 300 10*3/uL (ref 150–400)
RBC: 4.19 MIL/uL (ref 3.87–5.11)
RDW: 13.6 % (ref 11.5–15.5)
WBC: 15.3 10*3/uL — ABNORMAL HIGH (ref 4.0–10.5)
nRBC: 0 % (ref 0.0–0.2)

## 2019-12-04 LAB — GLUCOSE, CAPILLARY
Glucose-Capillary: 109 mg/dL — ABNORMAL HIGH (ref 70–99)
Glucose-Capillary: 160 mg/dL — ABNORMAL HIGH (ref 70–99)

## 2019-12-04 LAB — BASIC METABOLIC PANEL
Anion gap: 10 (ref 5–15)
BUN: 22 mg/dL — ABNORMAL HIGH (ref 6–20)
CO2: 25 mmol/L (ref 22–32)
Calcium: 9 mg/dL (ref 8.9–10.3)
Chloride: 106 mmol/L (ref 98–111)
Creatinine, Ser: 0.64 mg/dL (ref 0.44–1.00)
GFR calc Af Amer: >60 mL/min (ref >60)
GFR calc non Af Amer: >60 mL/min (ref >60)
Glucose, Bld: 122 mg/dL — ABNORMAL HIGH (ref 70–99)
Potassium: 3.6 mmol/L (ref 3.5–5.1)
Sodium: 141 mmol/L (ref 135–145)

## 2019-12-04 LAB — MAGNESIUM: Magnesium: 2.1 mg/dL (ref 1.7–2.4)

## 2019-12-04 MED ORDER — METHYLPREDNISOLONE 4 MG PO TBPK: ORAL_TABLET | ORAL | 0 refills | Status: DC

## 2019-12-04 NOTE — Plan of Care (Signed)
Discharge teaching completed with patient.  Patient is in stable condition. 

## 2019-12-04 NOTE — Discharge Summary (Signed)
Physician Discharge Summary  Cassie Ross UQJ:[id number] DOB: 10-Jul-1960 DOA: 12/02/2019  PCP: Lavera Guise, MD  Admit date: 12/02/2019 Discharge date: 12/04/2019  Admitted From: Home Disposition:  Home  Recommendations for Outpatient Follow-up:  1. Follow up with PCP in 1-2 weeks 2. Follow up with pulmonary as directed  Home Health:No Equipment/Devices:None Discharge Condition:Stable CODE STATUS:Full Diet recommendation: Heart Healthy / Carb Modified    Brief/Interim Summary: HPI: Cassie Ross is a 59 y.o. female with medical history significant of asthma uncontrolled, DM2 on insulin, HTN who presented to the ED with increased cough with sputum production and dyspnea.  Pt reported 1-2 weeks of increasing cough with production of white sputum.  This morning, pt became more dyspneic, and gave herself 2 DuoNeb at home which didn't help.  Pt also experienced heart racing.  No fever, chest pain, abdominal pain, N/V/D, dysuria, increased swelling.  Pt had completed Z-pak and doxycycline by her outpatient provider for complaints of "nasal congestion, headache, and sore/scratchy throat."  Pt reported no personal smoking hx but 2nd-hand exposure before she was 59 years old.  Pt does have frequent asthma exacerbations and frequent prednisone taper courses.  Pt follows with outpatient pulm and had Symbicort on her med list, but pt reported not taking any inhaler at home except for albuterol.   Patient was started on multimodal treatment modalities with prednisone, bronchodilator therapy. She stable for discharge at this time. Will prescribe a short course of Medrol taper on discharge. Patient instructed to follow-up with primary care as well as pulmonology on discharge.   Discharge Diagnoses:  Active Problems:   Asthma exacerbation  # Acute hypoxic respiratory failure 2/2  # Acute Asthma exacerbation likely due to seasonal allergies --In the ED, pt desat down to 86% on room air, and  was put on 2L O2.No signs of pneumonia. COVIDneg, Flu neg.  --Pt received DuoNebx3and IV solumedrol 125 mg in the ED but still hypoxic without supplemental O2. -Weaned off supplemental oxygen at time of discharge -Ambulated around the unit without desaturation, low saturation 91% -Patient stable for discharge home -Prescribed Medrol Dosepak on discharge -Follow-up outpatient PCP and pulmonology -Patient will discuss addition of a maintenance inhaler with her primary care or pulmonology. Patient sees Dr. Chancy Milroy as an outpatient she states she has been seeing him for over 20 years. I encouraged her to 6-week repeat consultation with her pulmonologist in order to discuss closer monitoring of her theophylline levels and addition of a maintenance inhaler  # DM2 on insulin --recent A1c8.7 about a month ago. Pt reported taking regular insulin as sliding scale. Also has FARXIGAandSemaglutideon home med list. -Can continue home regimen on discharge  # HTN --continue home cardizem 180 mg BID  # GERD --continue home PPI  # HLD --continue home statin   Discharge Instructions  Discharge Instructions    Diet - low sodium heart healthy   Complete by: As directed    Increase activity slowly   Complete by: As directed      Allergies as of 12/04/2019      Reactions   Shellfish Allergy Swelling   Sunflower Oil Swelling   seeds   Levaquin [levofloxacin] Rash   Allergy to pill form only. Patient has had IV with no problem.   Penicillins Rash   Has patient had a PCN reaction causing immediate rash, facial/tongue/throat swelling, SOB or lightheadedness with hypotension: no Has patient had a PCN reaction causing severe rash involving mucus membranes or skin necrosis: no  Has patient had a PCN reaction that required hospitalization no Has patient had a PCN reaction occurring within the last 10 years: no If all of the above answers are "NO", then may proceed with Cephalosporin use.       Medication List    TAKE these medications   acetaminophen 500 MG tablet Commonly known as: TYLENOL Take 1,000 mg by mouth every 8 (eight) hours as needed for moderate pain.   albuterol 108 (90 Base) MCG/ACT inhaler Commonly known as: ProAir HFA Inhale 2 puffs into the lungs every 4 (four) hours as needed for wheezing or shortness of breath. What changed: Another medication with the same name was changed. Make sure you understand how and when to take each.   albuterol (2.5 MG/3ML) 0.083% nebulizer solution Commonly known as: PROVENTIL USE 1 VIAL IN NEBULIZER EVERY 6 HOURS AS NEEDED What changed: See the new instructions.   ALLEGRA-D 24 HOUR PO Take by mouth at bedtime.   ALPRAZolam 0.5 MG tablet Commonly known as: XANAX TAKE 1/2 (ONE-HALF) TABLET BY MOUTH IN THE MORNING AND TAKE 1 TABLET IN THE EVENING. What changed:   how much to take  how to take this  when to take this   aspirin EC 81 MG tablet Take 81 mg by mouth daily.   atorvastatin 10 MG tablet Commonly known as: LIPITOR Take 1 tablet (10 mg total) by mouth daily.   budesonide-formoterol 160-4.5 MCG/ACT inhaler Commonly known as: SYMBICORT Inhale 1 puff into the lungs 2 (two) times daily.   CORICIDIN 2-325 MG Tabs Generic drug: Chlorpheniramine-APAP Take 2 tablets by mouth daily as needed (cold symptoms).   dapagliflozin propanediol 5 MG Tabs tablet Commonly known as: Farxiga Take 1 tablet (5 mg total) by mouth daily before breakfast. For dm   diltiazem 180 MG 24 hr capsule Commonly known as: CARDIZEM CD Take 1 capsule (180 mg total) by mouth 2 (two) times daily.   doxycycline 100 MG tablet Commonly known as: VIBRA-TABS Take 1 tablet (100 mg total) by mouth 2 (two) times daily.   fluticasone 50 MCG/ACT nasal spray Commonly known as: FLONASE Place 1 spray into both nostrils every morning.   furosemide 80 MG tablet Commonly known as: LASIX Take 80 mg by mouth 2 (two) times daily.   ibuprofen  200 MG tablet Commonly known as: ADVIL Take 200 mg by mouth every 8 (eight) hours as needed (pain).   insulin regular 100 units/mL injection Commonly known as: HumuLIN R Sliding scale - use as directed QAC per sliding scale instructions. Max daily dose is 25 units in 24 hours. E11.65   ipratropium-albuterol 0.5-2.5 (3) MG/3ML Soln Commonly known as: DUONEB Take 3 mLs by nebulization every 4 (four) hours as needed for wheezing.   magic mouthwash w/lidocaine Soln Take 5 mLs by mouth 4 (four) times daily as needed for mouth pain.   methylPREDNISolone 4 MG Tbpk tablet Commonly known as: MEDROL DOSEPAK Medrol dosepak steroid taper.  Take per package directions   montelukast 10 MG tablet Commonly known as: SINGULAIR Take 1 tablet (10 mg total) by mouth at bedtime.   ONE-A-DAY MENOPAUSE FORMULA PO Take 1 tablet by mouth daily.   OneTouch Verio w/Device Kit Use as directed diag   Ozempic (1 MG/DOSE) 2 MG/1.5ML Sopn Generic drug: Semaglutide (1 MG/DOSE) Inject 0.75 mLs (1 mg total) into the skin once a week.   potassium chloride 10 MEQ tablet Commonly known as: KLOR-CON Take 1 tablet (10 mEq total) by mouth 2 (two) times  daily.   Protonix 40 MG tablet Generic drug: pantoprazole Take 40 mg by mouth 2 (two) times daily.   theophylline 300 MG 12 hr tablet Commonly known as: THEODUR Take 1 tablet (300 mg total) by mouth 2 (two) times daily.       Allergies  Allergen Reactions  . Shellfish Allergy Swelling  . Sunflower Oil Swelling    seeds  . Levaquin [Levofloxacin] Rash    Allergy to pill form only. Patient has had IV with no problem.  Marland Kitchen Penicillins Rash    Has patient had a PCN reaction causing immediate rash, facial/tongue/throat swelling, SOB or lightheadedness with hypotension: no Has patient had a PCN reaction causing severe rash involving mucus membranes or skin necrosis: no Has patient had a PCN reaction that required hospitalization no Has patient had a PCN  reaction occurring within the last 10 years: no If all of the above answers are "NO", then may proceed with Cephalosporin use.     Consultations:  None   Procedures/Studies: DG Chest 2 View  Result Date: 12/02/2019 CLINICAL DATA:  Chest pain. EXAM: CHEST - 2 VIEW COMPARISON:  10/11/2019. FINDINGS: Mediastinum and hilar structures normal. Heart size normal. Prominent cardiac fat pad noted. No focal infiltrate. No pleural effusion or pneumothorax. Degenerative change thoracic spine. IMPRESSION: No acute cardiopulmonary disease. Electronically Signed   By: Marcello Moores  Register   On: 12/02/2019 13:37    (Echo, Carotid, EGD, Colonoscopy, ERCP)    Subjective: Patient seen and examined. Respiratory status improved at time of discharge. Still some mild end expiratory wheeze but no desaturation on ambulation and normal work of breathing. Stable for discharge home.  Discharge Exam: Vitals:   12/04/19 1118 12/04/19 1133  BP: (!) 147/73   Pulse: 79   Resp:    Temp: 98.1 F (36.7 C)   SpO2: 95% 91%   Vitals:   12/03/19 2249 12/04/19 0703 12/04/19 1118 12/04/19 1133  BP: 127/72 122/63 (!) 147/73   Pulse: 93 80 79   Resp: 20 20    Temp: 98.2 F (36.8 C) 98 F (36.7 C) 98.1 F (36.7 C)   TempSrc: Oral Oral Oral   SpO2: 97% 98% 95% 91%  Weight:      Height:        General: Pt is alert, awake, not in acute distress Cardiovascular: RRR, S1/S2 +, no rubs, no gallops Respiratory: Mild end expiratory wheeze bilaterally. Normal work of breathing. Room air Abdominal: Soft, NT, ND, bowel sounds + Extremities: no edema, no cyanosis    The results of significant diagnostics from this hospitalization (including imaging, microbiology, ancillary and laboratory) are listed below for reference.     Microbiology: Recent Results (from the past 240 hour(s))  Respiratory Panel by RT PCR (Flu A&B, Covid) - Nasopharyngeal Swab     Status: None   Collection Time: 12/02/19  1:55 PM   Specimen:  Nasopharyngeal Swab  Result Value Ref Range Status   SARS Coronavirus 2 by RT PCR NEGATIVE NEGATIVE Final    Comment: (NOTE) SARS-CoV-2 target nucleic acids are NOT DETECTED.  The SARS-CoV-2 RNA is generally detectable in upper respiratoy specimens during the acute phase of infection. The lowest concentration of SARS-CoV-2 viral copies this assay can detect is 131 copies/mL. A negative result does not preclude SARS-Cov-2 infection and should not be used as the sole basis for treatment or other patient management decisions. A negative result may occur with  improper specimen collection/handling, submission of specimen other than nasopharyngeal swab, presence  of viral mutation(s) within the areas targeted by this assay, and inadequate number of viral copies (<131 copies/mL). A negative result must be combined with clinical observations, patient history, and epidemiological information. The expected result is Negative.  Fact Sheet for Patients:  PinkCheek.be  Fact Sheet for Healthcare Providers:  GravelBags.it  This test is no t yet approved or cleared by the [place] FDA and  has been authorized for detection and/or diagnosis of SARS-CoV-2 by FDA under an Emergency Use Authorization (EUA). This EUA will remain  in effect (meaning this test can be used) for the duration of the COVID-19 declaration under Section 564(b)(1) of the Act, 21 U.S.C. section 360bbb-3(b)(1), unless the authorization is terminated or revoked sooner.     Influenza A by PCR NEGATIVE NEGATIVE Final   Influenza B by PCR NEGATIVE NEGATIVE Final    Comment: (NOTE) The Xpert Xpress SARS-CoV-2/FLU/RSV assay is intended as an aid in  the diagnosis of influenza from Nasopharyngeal swab specimens and  should not be used as a sole basis for treatment. Nasal washings and  aspirates are unacceptable for Xpert Xpress SARS-CoV-2/FLU/RSV  testing.  Fact Sheet  for Patients: PinkCheek.be  Fact Sheet for Healthcare Providers: GravelBags.it  This test is not yet approved or cleared by the [place] FDA and  has been authorized for detection and/or diagnosis of SARS-CoV-2 by  FDA under an Emergency Use Authorization (EUA). This EUA will remain  in effect (meaning this test can be used) for the duration of the  Covid-19 declaration under Section 564(b)(1) of the Act, 21  U.S.C. section 360bbb-3(b)(1), unless the authorization is  terminated or revoked. Performed at [facility], [address] [place], [place] [address]      Labs: BNP (last 3 results) No results for input(s): BNP in the last 8760 hours. Basic Metabolic Panel: Recent Labs  Lab 12/02/19 1354 12/03/19 0510 12/04/19 0520  NA 141 135 141  K 3.7 4.3 3.6  CL 105 101 106  CO2 _0 GLUCOSE 149* 341* 122*  BUN 6 14 22*  CREATININE 0.45 0.68 0.64  CALCIUM 8.8* 9.5 9.0  MG  --  2.3 2.1   Liver Function Tests: No results for input(s): AST, ALT, ALKPHOS, BILITOT, PROT, ALBUMIN in the last 168 hours. No results for input(s): LIPASE, AMYLASE in the last 168 hours. No results for input(s): AMMONIA in the last 168 hours. CBC: Recent Labs  Lab 12/02/19 1354 12/03/19 0510 12/04/19 0520  WBC 11.0* 12.6* 15.3*  HGB 14.1 14.0 13.1  HCT 40.1 40.9 38.4  MCV 88.5 91.3 91.6  PLT 314 326 300   Cardiac Enzymes: No results for input(s): CKTOTAL, CKMB, CKMBINDEX, TROPONINI in the last 168 hours. BNP: Invalid input(s): POCBNP CBG: Recent Labs  Lab 12/03/19 1156 12/03/19 1638 12/03/19 2102 12/04/19 0742 12/04/19 1117  GLUCAP 197* 298* 141* 109* 160*   D-Dimer No results for input(s): DDIMER in the last 72 hours. Hgb A1c No results for input(s): HGBA1C in the last 72 hours. Lipid Profile No results for input(s): CHOL, HDL, LDLCALC, TRIG, CHOLHDL, LDLDIRECT in the last 72 hours. Thyroid function  studies No results for input(s): TSH, T4TOTAL, T3FREE, THYROIDAB in the last 72 hours.  Invalid input(s): FREET3 Anemia work up No results for input(s): VITAMINB12, FOLATE, FERRITIN, TIBC, IRON, RETICCTPCT in the last 72 hours. Urinalysis    Component Value Date/Time   COLORURINE Yellow 07/09/2012 2150   APPEARANCEUR Clear 12/18/2018 0804   LABSPEC 1.012 07/09/2012 2150  PHURINE 5.0 07/09/2012 2150   GLUCOSEU Negative 12/18/2018 0804   GLUCOSEU Negative 07/09/2012 2150   HGBUR Negative 07/09/2012 2150   BILIRUBINUR Negative 12/18/2018 0804   BILIRUBINUR Negative 07/09/2012 2150   KETONESUR Negative 07/09/2012 2150   PROTEINUR Negative 12/18/2018 0804   PROTEINUR Negative 07/09/2012 2150   NITRITE Negative 12/18/2018 0804   NITRITE Negative 07/09/2012 2150   LEUKOCYTESUR 3+ (A) 12/18/2018 0804   LEUKOCYTESUR Trace 07/09/2012 2150   Sepsis Labs Invalid input(s): PROCALCITONIN,  WBC,  LACTICIDVEN Microbiology Recent Results (from the past 240 hour(s))  Respiratory Panel by RT PCR (Flu A&B, Covid) - Nasopharyngeal Swab     Status: None   Collection Time: 12/02/19  1:55 PM   Specimen: Nasopharyngeal Swab  Result Value Ref Range Status   SARS Coronavirus 2 by RT PCR NEGATIVE NEGATIVE Final    Comment: (NOTE) SARS-CoV-2 target nucleic acids are NOT DETECTED.  The SARS-CoV-2 RNA is generally detectable in upper respiratoy specimens during the acute phase of infection. The lowest concentration of SARS-CoV-2 viral copies this assay can detect is 131 copies/mL. A negative result does not preclude SARS-Cov-2 infection and should not be used as the sole basis for treatment or other patient management decisions. A negative result may occur with  improper specimen collection/handling, submission of specimen other than nasopharyngeal swab, presence of viral mutation(s) within the areas targeted by this assay, and inadequate number of viral copies (<131 copies/mL). A negative result  must be combined with clinical observations, patient history, and epidemiological information. The expected result is Negative.  Fact Sheet for Patients:  PinkCheek.be  Fact Sheet for Healthcare Providers:  GravelBags.it  This test is no t yet approved or cleared by the [place] FDA and  has been authorized for detection and/or diagnosis of SARS-CoV-2 by FDA under an Emergency Use Authorization (EUA). This EUA will remain  in effect (meaning this test can be used) for the duration of the COVID-19 declaration under Section 564(b)(1) of the Act, 21 U.S.C. section 360bbb-3(b)(1), unless the authorization is terminated or revoked sooner.     Influenza A by PCR NEGATIVE NEGATIVE Final   Influenza B by PCR NEGATIVE NEGATIVE Final    Comment: (NOTE) The Xpert Xpress SARS-CoV-2/FLU/RSV assay is intended as an aid in  the diagnosis of influenza from Nasopharyngeal swab specimens and  should not be used as a sole basis for treatment. Nasal washings and  aspirates are unacceptable for Xpert Xpress SARS-CoV-2/FLU/RSV  testing.  Fact Sheet for Patients: PinkCheek.be  Fact Sheet for Healthcare Providers: GravelBags.it  This test is not yet approved or cleared by the [place] FDA and  has been authorized for detection and/or diagnosis of SARS-CoV-2 by  FDA under an Emergency Use Authorization (EUA). This EUA will remain  in effect (meaning this test can be used) for the duration of the  Covid-19 declaration under Section 564(b)(1) of the Act, 21  U.S.C. section 360bbb-3(b)(1), unless the authorization is  terminated or revoked. Performed at [facility], [address], [place], [place] [address]      Time coordinating discharge: Over 30 minutes  SIGNED:   Sidney Ace, MD  [facility] 12/04/2019, 12:40 PM Pager   If 7PM-7AM,  please contact night-coverage

## 2019-12-04 NOTE — Progress Notes (Signed)
PT Cancellation Note  Patient Details Name: Cassie Ross MRN: [id number] DOB: September 04, 1960   Cancelled Treatment:    Reason Eval/Treat Not Completed: PT screened, no needs identified, will sign off.  Pt walked with no change in O2 sats with OT, will sign off as she is not unstable with gait.   Ramond Dial 12/04/2019, 11:40 AM   Mee Hives, PT MS Acute Rehab Dept. Number: [place] and [place]

## 2019-12-04 NOTE — Evaluation (Signed)
Occupational Therapy Evaluation Patient Details Name: Cassie Ross MRN: [id number] DOB: Dec 21, 1960 Today's Date: 12/04/2019    History of Present Illness Cassie Ross is a 59 y.o. female with medical history significant of asthma uncontrolled, DM2 on insulin, HTN who presented to the ED with increased cough with sputum production and dyspnea.   Clinical Impression   Ms. Pepitone was seen for OT evaluation this date. Prior to hospital admission, pt was independent in all aspects of ADL/IADL, and denies falls history in past 12 months. Pt lives with her spouse in a 1 story home with 3 steps to enter with bilateral hand rails. Currently pt reporting her breathing symptoms have generally resolved. Pt demonstrates baseline independence to perform ADL and mobility tasks and no strength, sensory, coordination, cognitive, or visual deficits appreciated with assessment. She is able to perform functional mobility with spO2 remaining WFL, <90% t/o full session, and only dropping to 91% with functional mobility outside of her hospital room while wearing a mask. Her sats improve to 94-95% on RA quickly with therapeutic rest break and cues for PLB. Pt educated on energy conservation strategies and return demonstrates excellent understanding of breathing techniques to support safety and functional independence upon hospital DC. No further skilled OT needs identified. Will sign off at this time. Please re-consult if additional OT needs arise during this admission.       Follow Up Recommendations  No OT follow up    Equipment Recommendations  None recommended by OT    Recommendations for Other Services       Precautions / Restrictions Precautions Precaution Comments: Low Fall Restrictions Weight Bearing Restrictions: No      Mobility Bed Mobility Overal bed mobility: Independent                Transfers Overall transfer level: Independent               General transfer  comment: Pt performs bed/functional mobility independently with good safety awareness and balance.    Balance Overall balance assessment: No apparent balance deficits (not formally assessed)                                         ADL either performed or assessed with clinical judgement   ADL Overall ADL's : At baseline                                       General ADL Comments: Pt up ad lib in room, using restroom independently and denies and current strength, sensory, or additional functional deficits. She reports her breathing is much improved from time of presentation. Pt ambulates around nsg station w/o AE or need for physical assist. SpO2 remains WFL <90% t/o.     Vision Baseline Vision/History: Wears glasses Patient Visual Report: No change from baseline       Perception     Praxis      Pertinent Vitals/Pain Pain Assessment: No/denies pain     Hand Dominance     Extremity/Trunk Assessment Upper Extremity Assessment Upper Extremity Assessment: Overall WFL for tasks assessed   Lower Extremity Assessment Lower Extremity Assessment: Overall WFL for tasks assessed   Cervical / Trunk Assessment Cervical / Trunk Assessment: Normal   Communication Communication Communication: No difficulties   Cognition Arousal/Alertness: Awake/alert  Behavior During Therapy: WFL for tasks assessed/performed Overall Cognitive Status: Within Functional Limits for tasks assessed                                 General Comments: Pt pleasant, conversational, eager to get home. A&O x4.   General Comments  Pt vitals monitored during session. SpO2 remains WFL dropping to 91% during functional mobility with pt wearing mask and ambulating around nsg station. This rebounds to 94% quickly (<30 seconds) with therapeutic rest break and cues for PLB.    Exercises Other Exercises Other Exercises: Pt educated on role of OT in acute setting, energy  conservation strategies including PLB and activity pacing, falls prevention strategies and routines modifications to support safety and functional independence upon DC home.   Shoulder Instructions      Home Living Family/patient expects to be discharged to:: Private residence Living Arrangements: Spouse/significant other Available Help at Discharge: Family;Available PRN/intermittently (Husband works.) Type of Home: House Home Access: Stairs to enter Technical brewer of Steps: 3 Entrance Stairs-Rails: Can reach both;Left;Right Home Layout: One level     Bathroom Shower/Tub: Teacher, early years/pre: Standard     Home Equipment: Cane - single point          Prior Functioning/Environment Level of Independence: Independent        Comments: Pt reports she is independent with ADL/IADL management. She does not use AE for functional mobility. Denies falls history in the past year. Manages breathing treatments at home. Has multiple nebulizers, pulse ox, etc. which she uses to monitor breathing. Long hx of similar asthma exacerbations.        OT Problem List: Cardiopulmonary status limiting activity;Decreased safety awareness      OT Treatment/Interventions:      OT Goals(Current goals can be found in the care plan section) Acute Rehab OT Goals Patient Stated Goal: To go home OT Goal Formulation: All assessment and education complete, DC therapy Time For Goal Achievement: 12/04/19 Potential to Achieve Goals: Good  OT Frequency:     Barriers to D/C:            Co-evaluation              AM-PAC OT "6 Clicks" Daily Activity     Outcome Measure Help from another person eating meals?: None Help from another person taking care of personal grooming?: None Help from another person toileting, which includes using toliet, bedpan, or urinal?: None Help from another person bathing (including washing, rinsing, drying)?: None Help from another person to put on  and taking off regular upper body clothing?: None Help from another person to put on and taking off regular lower body clothing?: None 6 Click Score: 24   End of Session Nurse Communication: Mobility status;Other (comment) (O2 during session.)  Activity Tolerance: Patient tolerated treatment well Patient left: in bed;with call bell/phone within reach  OT Visit Diagnosis: Other abnormalities of gait and mobility (R26.89)                Time: 3474-2595 OT Time Calculation (min): 13 min Charges:  OT General Charges $OT Visit: 1 Visit OT Evaluation $OT Eval Low Complexity: [place], M.S., OTR/L Ascom: [phone] 12/04/19, 12:22 PM

## 2019-12-04 NOTE — Plan of Care (Signed)
Continuing with plan of care. 

## 2019-12-06 ENCOUNTER — Other Ambulatory Visit: Payer: Self-pay

## 2019-12-06 MED ORDER — MONTELUKAST SODIUM 10 MG PO TABS
10.0000 mg | ORAL_TABLET | Freq: Every day | ORAL | 1 refills | Status: DC
Start: 2019-12-06 — End: 2020-08-03

## 2019-12-09 ENCOUNTER — Other Ambulatory Visit: Payer: Self-pay | Admitting: Adult Health

## 2019-12-14 DIAGNOSIS — H04123 Dry eye syndrome of bilateral lacrimal glands: Secondary | ICD-10-CM | POA: Diagnosis not present

## 2020-01-05 ENCOUNTER — Ambulatory Visit: Payer: PPO | Admitting: Internal Medicine

## 2020-01-12 ENCOUNTER — Ambulatory Visit: Payer: PPO | Admitting: Internal Medicine

## 2020-01-12 ENCOUNTER — Other Ambulatory Visit: Payer: Self-pay

## 2020-01-12 DIAGNOSIS — J45909 Unspecified asthma, uncomplicated: Secondary | ICD-10-CM | POA: Diagnosis not present

## 2020-01-12 LAB — PULMONARY FUNCTION TEST

## 2020-01-16 NOTE — Procedures (Signed)
[place], [address]  DATE OF SERVICE: January 12, 2020  Complete Pulmonary Function Testing Interpretation:  FINDINGS:  Forced vital capacity is moderately decreased.  FEV1 is 0.89 L which is 37% of predicted and is severely decreased.  FEV1 FVC ratio is moderately decreased.  Postbronchodilator there is no significant change in the FEV1 clinical improvement may still occur in the absence of spirometric improvement.  Total lung capacity is normal residual volume is increased residual volume total capacity ratio is increased FRC is increased.  DLCO is normal.  IMPRESSION:  Ms. pulmonary function study is consistent with severe obstructive lung disease.  There did not appear to be response to bronchodilators but clinical improvement may still occur in the absence of spirometric improvement and does not preclude the use of bronchodilators.  Allyne Gee, MD [facility] Pulmonary Critical Care Medicine Sleep Medicine

## 2020-01-24 DIAGNOSIS — K219 Gastro-esophageal reflux disease without esophagitis: Secondary | ICD-10-CM | POA: Diagnosis not present

## 2020-01-24 DIAGNOSIS — E669 Obesity, unspecified: Secondary | ICD-10-CM | POA: Diagnosis not present

## 2020-01-24 DIAGNOSIS — J4550 Severe persistent asthma, uncomplicated: Secondary | ICD-10-CM | POA: Diagnosis not present

## 2020-01-24 DIAGNOSIS — Z86718 Personal history of other venous thrombosis and embolism: Secondary | ICD-10-CM | POA: Diagnosis not present

## 2020-01-24 DIAGNOSIS — Z794 Long term (current) use of insulin: Secondary | ICD-10-CM | POA: Diagnosis not present

## 2020-01-24 DIAGNOSIS — E119 Type 2 diabetes mellitus without complications: Secondary | ICD-10-CM | POA: Diagnosis not present

## 2020-01-24 DIAGNOSIS — I10 Essential (primary) hypertension: Secondary | ICD-10-CM | POA: Diagnosis not present

## 2020-01-24 DIAGNOSIS — R0609 Other forms of dyspnea: Secondary | ICD-10-CM | POA: Diagnosis not present

## 2020-01-24 DIAGNOSIS — I208 Other forms of angina pectoris: Secondary | ICD-10-CM | POA: Diagnosis not present

## 2020-01-24 DIAGNOSIS — E782 Mixed hyperlipidemia: Secondary | ICD-10-CM | POA: Diagnosis not present

## 2020-01-24 DIAGNOSIS — I270 Primary pulmonary hypertension: Secondary | ICD-10-CM | POA: Diagnosis not present

## 2020-01-24 DIAGNOSIS — R000 Tachycardia, unspecified: Secondary | ICD-10-CM | POA: Diagnosis not present

## 2020-01-27 ENCOUNTER — Other Ambulatory Visit: Payer: Self-pay | Admitting: Internal Medicine

## 2020-01-31 ENCOUNTER — Ambulatory Visit: Payer: PPO | Admitting: Internal Medicine

## 2020-01-31 ENCOUNTER — Other Ambulatory Visit: Payer: Self-pay

## 2020-01-31 ENCOUNTER — Encounter: Payer: Self-pay | Admitting: Internal Medicine

## 2020-01-31 VITALS — BP 136/86 | HR 96 | Temp 98.3°F | Ht 63.0 in | Wt 197.4 lb

## 2020-01-31 DIAGNOSIS — R0602 Shortness of breath: Secondary | ICD-10-CM | POA: Diagnosis not present

## 2020-01-31 DIAGNOSIS — Z794 Long term (current) use of insulin: Secondary | ICD-10-CM

## 2020-01-31 DIAGNOSIS — J45909 Unspecified asthma, uncomplicated: Secondary | ICD-10-CM

## 2020-01-31 DIAGNOSIS — E1165 Type 2 diabetes mellitus with hyperglycemia: Secondary | ICD-10-CM

## 2020-01-31 MED ORDER — ALBUTEROL SULFATE HFA 108 (90 BASE) MCG/ACT IN AERS: 2.0000 | INHALATION_SPRAY | RESPIRATORY_TRACT | 4 refills | Status: DC | PRN

## 2020-01-31 MED ORDER — TRELEGY ELLIPTA 100-62.5-25 MCG/INH IN AEPB: 1.0000 | INHALATION_SPRAY | Freq: Every day | RESPIRATORY_TRACT | 4 refills | Status: DC

## 2020-01-31 NOTE — Progress Notes (Signed)
[facility] [place], [place] [address]  Pulmonary Sleep Medicine   Office Visit Note  Patient Name: Cassie Ross DOB: [birth year] MRN [id number]  Date of Service: 01/31/2020  Complaints/HPI: COPD asthma. Recently admitted in September for exacerbation of the COPD.  Overall she states that she does feel better now she has not had any recent exacerbations.  I reviewed her inhaler regimen she might benefit from Trelegy however she does have a history of glaucoma although it is not closed angle glaucoma.  I will give her a sample of Trelegy but she is going to check with her eye physician regarding okay to use.  She has not had any exposure to COVID-19.  Hypertension under better control.  Reflux has been under control takes her usual regimen for the reflux.  ROS  General: (-) fever, (-) chills, (-) night sweats, (-) weakness Skin: (-) rashes, (-) itching,. Eyes: (-) visual changes, (-) redness, (-) itching. Nose and Sinuses: (-) nasal stuffiness or itchiness, (-) postnasal drip, (-) nosebleeds, (-) sinus trouble. Mouth and Throat: (-) sore throat, (-) hoarseness. Neck: (-) swollen glands, (-) enlarged thyroid, (-) neck pain. Respiratory: + cough, (-) bloody sputum, + shortness of breath, - wheezing. Cardiovascular: - ankle swelling, (-) chest pain. Lymphatic: (-) lymph node enlargement. Neurologic: (-) numbness, (-) tingling. Psychiatric: (-) anxiety, (-) depression   Current Medication: Outpatient Encounter Medications as of 01/31/2020  Medication Sig  . acetaminophen (TYLENOL) 500 MG tablet Take 1,000 mg by mouth every 8 (eight) hours as needed for moderate pain.   Marland Kitchen albuterol (PROAIR HFA) 108 (90 Base) MCG/ACT inhaler Inhale 2 puffs into the lungs every 4 (four) hours as needed for wheezing or shortness of breath.  Marland Kitchen albuterol (PROVENTIL) (2.5 MG/3ML) 0.083% nebulizer solution USE 1 VIAL IN NEBULIZER EVERY 6 HOURS AS NEEDED (Patient taking  differently: Take 2.5 mg by nebulization every 6 (six) hours as needed. USE 1 VIAL IN NEBULIZER EVERY 6 HOURS AS NEEDED)  . ALPRAZolam (XANAX) 0.5 MG tablet TAKE 1/2 (ONE-HALF) TABLET BY MOUTH IN THE MORNING AND TAKE 1 TABLET IN THE EVENING. (Patient taking differently: Take 0.25-0.5 mg by mouth 2 (two) times daily. TAKE 1/2 (ONE-HALF) TABLET BY MOUTH IN THE MORNING AND TAKE 1 TABLET IN THE EVENING.)  . aspirin EC 81 MG tablet Take 81 mg by mouth daily.  Marland Kitchen atorvastatin (LIPITOR) 10 MG tablet Take 1 tablet (10 mg total) by mouth daily.  . Blood Glucose Monitoring Suppl (ONETOUCH VERIO) w/Device KIT Use as directed diag  . budesonide-formoterol (SYMBICORT) 160-4.5 MCG/ACT inhaler Inhale 1 puff into the lungs 2 (two) times daily.   . Chlorpheniramine-APAP (CORICIDIN) 2-325 MG TABS Take 2 tablets by mouth daily as needed (cold symptoms).   . dapagliflozin propanediol (FARXIGA) 5 MG TABS tablet Take 1 tablet (5 mg total) by mouth daily before breakfast. For dm  . diltiazem (CARDIZEM CD) 180 MG 24 hr capsule Take 1 capsule (180 mg total) by mouth 2 (two) times daily.  Marland Kitchen doxycycline (VIBRA-TABS) 100 MG tablet Take 1 tablet (100 mg total) by mouth 2 (two) times daily.  Marland Kitchen Fexofenadine-Pseudoephedrine (ALLEGRA-D 24 HOUR PO) Take by mouth at bedtime.  . fluticasone (FLONASE) 50 MCG/ACT nasal spray Place 1 spray into both nostrils every morning.  . furosemide (LASIX) 80 MG tablet Take 80 mg by mouth 2 (two) times daily.  Marland Kitchen ibuprofen (ADVIL,MOTRIN) 200 MG tablet Take 200 mg by mouth every 8 (eight) hours as needed (pain).   Marland Kitchen  insulin regular (HUMULIN R) 100 units/mL injection Sliding scale - use as directed QAC per sliding scale instructions. Max daily dose is 25 units in 24 hours. E11.65  . ipratropium-albuterol (DUONEB) 0.5-2.5 (3) MG/3ML SOLN USE 1 AMPULE IN NEBULIZER EVERY 4 HOURS AS NEEDED  . magic mouthwash w/lidocaine SOLN Take 5 mLs by mouth 4 (four) times daily as needed for mouth pain.  .  methylPREDNISolone (MEDROL DOSEPAK) 4 MG TBPK tablet Medrol dosepak steroid taper.  Take per package directions  . montelukast (SINGULAIR) 10 MG tablet Take 1 tablet (10 mg total) by mouth at bedtime.  . Multiple Vitamins-Minerals (ONE-A-DAY MENOPAUSE FORMULA PO) Take 1 tablet by mouth daily.  . pantoprazole (PROTONIX) 40 MG tablet Take 40 mg by mouth 2 (two) times daily.   . potassium chloride (KLOR-CON) 10 MEQ tablet Take 1 tablet (10 mEq total) by mouth 2 (two) times daily.  . Semaglutide, 1 MG/DOSE, (OZEMPIC, 1 MG/DOSE,) 2 MG/1.5ML SOPN Inject 0.75 mLs (1 mg total) into the skin once a week.  . theophylline (THEODUR) 300 MG 12 hr tablet Take 1 tablet (300 mg total) by mouth 2 (two) times daily.   No facility-administered encounter medications on file as of 01/31/2020.    Surgical History: Past Surgical History:  Procedure Laterality Date  . cataract surgery    . CESAREAN SECTION    . CHOLECYSTECTOMY    . COLONOSCOPY WITH PROPOFOL N/A 01/06/2018   Procedure: COLONOSCOPY WITH PROPOFOL;  Surgeon: Lollie Sails, MD;  Location: [facility] ENDOSCOPY;  Service: Endoscopy;  Laterality: N/A;  . ESOPHAGEAL DILATION    . ESOPHAGOGASTRODUODENOSCOPY (EGD) WITH PROPOFOL N/A 01/06/2018   Procedure: ESOPHAGOGASTRODUODENOSCOPY (EGD) WITH PROPOFOL;  Surgeon: Lollie Sails, MD;  Location: [facility] ENDOSCOPY;  Service: Endoscopy;  Laterality: N/A;  . HERNIA REPAIR    . hiatial hernia repair    . NASAL SINUS SURGERY      Medical History: Past Medical History:  Diagnosis Date  . Asthma   . CHF (congestive heart failure) ([place])    1991- unknown after asthma attack  . Chicken pox   . COPD (chronic obstructive pulmonary disease) ([place])   . Diabetes ([place])   . DVT (deep venous thrombosis) ([place])   . GERD (gastroesophageal reflux disease)   . Hernia, hiatal   . HLD (hyperlipidemia)   . Hypertension   . Hypertension   . Seasonal allergies     Family History: Family History  Problem Relation Age of  Onset  . Hypertension Mother   . Lung cancer Mother   . Colon cancer Father   . Colon polyps Sister   . Diabetes Sister     Social History: Social History   Socioeconomic History  . Marital status: Married    Spouse name: Not on file  . Number of children: Not on file  . Years of education: Not on file  . Highest education level: Not on file  Occupational History  . Not on file  Tobacco Use  . Smoking status: Never Smoker  . Smokeless tobacco: Never Used  Substance and Sexual Activity  . Alcohol use: Yes    Comment: glass of wine ocassionally   . Drug use: No  . Sexual activity: Not on file  Other Topics Concern  . Not on file  Social History Narrative   Lives at home with family. Independent   Social Determinants of Health   Financial Resource Strain:   . Difficulty of Paying Living Expenses: Not on file  Food Insecurity:   .  Worried About Charity fundraiser in the Last Year: Not on file  . Ran Out of Food in the Last Year: Not on file  Transportation Needs:   . Lack of Transportation (Medical): Not on file  . Lack of Transportation (Non-Medical): Not on file  Physical Activity:   . Days of Exercise per Week: Not on file  . Minutes of Exercise per Session: Not on file  Stress:   . Feeling of Stress : Not on file  Social Connections:   . Frequency of Communication with Friends and Family: Not on file  . Frequency of Social Gatherings with Friends and Family: Not on file  . Attends Religious Services: Not on file  . Active Member of Clubs or Organizations: Not on file  . Attends Archivist Meetings: Not on file  . Marital Status: Not on file  Intimate Partner Violence:   . Fear of Current or Ex-Partner: Not on file  . Emotionally Abused: Not on file  . Physically Abused: Not on file  . Sexually Abused: Not on file    Vital Signs: Blood pressure 136/86, pulse 96, temperature 98.3 F (36.8 C), height _0  (1.6 m), weight 197 lb 6.4 oz (89.5 kg),  SpO2 97 %.  Examination: General Appearance: The patient is well-developed, well-nourished, and in no distress. Skin: Gross inspection of skin unremarkable. Head: normocephalic, no gross deformities. Eyes: no gross deformities noted. ENT: ears appear grossly normal no exudates. Neck: Supple. No thyromegaly. No LAD. Respiratory: few rhonchi noted. Cardiovascular: Normal S1 and S2 without murmur or rub. Extremities: No cyanosis. pulses are equal. Neurologic: Alert and oriented. No involuntary movements.  LABS: Recent Results (from the past 2160 hour(s))  Basic metabolic panel     Status: Abnormal   Collection Time: 12/02/19  1:54 PM  Result Value Ref Range   Sodium 141 135 - 145 mmol/L   Potassium 3.7 3.5 - 5.1 mmol/L   Chloride 105 98 - 111 mmol/L   CO2 23 22 - 32 mmol/L   Glucose, Bld 149 (H) 70 - 99 mg/dL    Comment: Glucose reference range applies only to samples taken after fasting for at least 8 hours.   BUN 6 6 - 20 mg/dL   Creatinine, Ser 0.45 0.44 - 1.00 mg/dL   Calcium 8.8 (L) 8.9 - 10.3 mg/dL   GFR calc non Af Amer >60 >60 mL/min   GFR calc Af Amer >60 >60 mL/min   Anion gap 13 5 - 15    Comment: Performed at [facility], [address] [place], [place] [address]  CBC     Status: Abnormal   Collection Time: 12/02/19  1:54 PM  Result Value Ref Range   WBC 11.0 (H) 4.0 - 10.5 K/uL   RBC 4.53 3.87 - 5.11 MIL/uL   Hemoglobin 14.1 12.0 - 15.0 g/dL   HCT 40.1 36 - 46 %   MCV 88.5 80.0 - 100.0 fL   MCH 31.1 26.0 - 34.0 pg   MCHC 35.2 30.0 - 36.0 g/dL   RDW 13.4 11.5 - 15.5 %   Platelets 314 150 - 400 K/uL   nRBC 0.0 0.0 - 0.2 %    Comment: Performed at [facility], [address] [place], [place] [address]  Troponin I (High Sensitivity)     Status: None   Collection Time: 12/02/19  1:54 PM  Result Value Ref Range   Troponin I (High Sensitivity) 5 <18 ng/L  Comment: (NOTE) Elevated high sensitivity troponin I (hsTnI) values and significant   changes across serial measurements may suggest ACS but many other  chronic and acute conditions are known to elevate hsTnI results.  Refer to the "Links" section for chest pain algorithms and additional  guidance. Performed at [facility], [address] [place], [place] [address]   Blood gas, venous     Status: Abnormal   Collection Time: 12/02/19  1:55 PM  Result Value Ref Range   pH, Ven 7.36 7.25 - 7.43   pCO2, Ven 42 (L) 44 - 60 mmHg   pO2, Ven 34.0 32 - 45 mmHg   Bicarbonate 23.7 20.0 - 28.0 mmol/L   Acid-base deficit 1.8 0.0 - 2.0 mmol/L   O2 Saturation 62.5 %   Patient temperature 37.0    Collection site VENOUS    Sample type VENOUS     Comment: Performed at [facility], [address] [place], [place] [address]  Respiratory Panel by RT PCR (Flu A&B, Covid) - Nasopharyngeal Swab     Status: None   Collection Time: 12/02/19  1:55 PM   Specimen: Nasopharyngeal Swab  Result Value Ref Range   SARS Coronavirus 2 by RT PCR NEGATIVE NEGATIVE    Comment: (NOTE) SARS-CoV-2 target nucleic acids are NOT DETECTED.  The SARS-CoV-2 RNA is generally detectable in upper respiratoy specimens during the acute phase of infection. The lowest concentration of SARS-CoV-2 viral copies this assay can detect is 131 copies/mL. A negative result does not preclude SARS-Cov-2 infection and should not be used as the sole basis for treatment or other patient management decisions. A negative result may occur with  improper specimen collection/handling, submission of specimen other than nasopharyngeal swab, presence of viral mutation(s) within the areas targeted by this assay, and inadequate number of viral copies (<131 copies/mL). A negative result must be combined with clinical observations, patient history, and epidemiological information. The expected result is Negative.  Fact Sheet for Patients:  PinkCheek.be  Fact Sheet for Healthcare  Providers:  GravelBags.it  This test is no t yet approved or cleared by the [place] FDA and  has been authorized for detection and/or diagnosis of SARS-CoV-2 by FDA under an Emergency Use Authorization (EUA). This EUA will remain  in effect (meaning this test can be used) for the duration of the COVID-19 declaration under Section 564(b)(1) of the Act, 21 U.S.C. section 360bbb-3(b)(1), unless the authorization is terminated or revoked sooner.     Influenza A by PCR NEGATIVE NEGATIVE   Influenza B by PCR NEGATIVE NEGATIVE    Comment: (NOTE) The Xpert Xpress SARS-CoV-2/FLU/RSV assay is intended as an aid in  the diagnosis of influenza from Nasopharyngeal swab specimens and  should not be used as a sole basis for treatment. Nasal washings and  aspirates are unacceptable for Xpert Xpress SARS-CoV-2/FLU/RSV  testing.  Fact Sheet for Patients: PinkCheek.be  Fact Sheet for Healthcare Providers: GravelBags.it  This test is not yet approved or cleared by the [place] FDA and  has been authorized for detection and/or diagnosis of SARS-CoV-2 by  FDA under an Emergency Use Authorization (EUA). This EUA will remain  in effect (meaning this test can be used) for the duration of the  Covid-19 declaration under Section 564(b)(1) of the Act, 21  U.S.C. section 360bbb-3(b)(1), unless the authorization is  terminated or revoked. Performed at [facility], [address] [place], [place] [address]   Troponin I (High Sensitivity)  Status: None   Collection Time: 12/02/19  3:25 PM  Result Value Ref Range   Troponin I (High Sensitivity) 5 <18 ng/L    Comment: (NOTE) Elevated high sensitivity troponin I (hsTnI) values and significant  changes across serial measurements may suggest ACS but many other  chronic and acute conditions are known to elevate hsTnI results.  Refer to the "Links"  section for chest pain algorithms and additional  guidance. Performed at [facility], [address] [place], [place] [address]   Glucose, capillary     Status: Abnormal   Collection Time: 12/02/19  5:39 PM  Result Value Ref Range   Glucose-Capillary 189 (H) 70 - 99 mg/dL    Comment: Glucose reference range applies only to samples taken after fasting for at least 8 hours.  Glucose, capillary     Status: Abnormal   Collection Time: 12/02/19 10:56 PM  Result Value Ref Range   Glucose-Capillary 270 (H) 70 - 99 mg/dL    Comment: Glucose reference range applies only to samples taken after fasting for at least 8 hours.  HIV Antibody (routine testing w rflx)     Status: None   Collection Time: 12/03/19 12:31 AM  Result Value Ref Range   HIV Screen 4th Generation wRfx Non Reactive Non Reactive    Comment: Performed at [facility], [place] [address] [place], [place] [address]  Basic metabolic panel     Status: Abnormal   Collection Time: 12/03/19  5:10 AM  Result Value Ref Range   Sodium 135 135 - 145 mmol/L   Potassium 4.3 3.5 - 5.1 mmol/L   Chloride 101 98 - 111 mmol/L   CO2 23 22 - 32 mmol/L   Glucose, Bld 341 (H) 70 - 99 mg/dL    Comment: Glucose reference range applies only to samples taken after fasting for at least 8 hours.   BUN 14 6 - 20 mg/dL   Creatinine, Ser 0.68 0.44 - 1.00 mg/dL   Calcium 9.5 8.9 - 10.3 mg/dL   GFR calc non Af Amer >60 >60 mL/min   GFR calc Af Amer >60 >60 mL/min   Anion gap 11 5 - 15    Comment: Performed at [facility], [address] [place], [place] [address]  CBC     Status: Abnormal   Collection Time: 12/03/19  5:10 AM  Result Value Ref Range   WBC 12.6 (H) 4.0 - 10.5 K/uL   RBC 4.48 3.87 - 5.11 MIL/uL   Hemoglobin 14.0 12.0 - 15.0 g/dL   HCT 40.9 36 - 46 %   MCV 91.3 80.0 - 100.0 fL   MCH 31.3 26.0 - 34.0 pg   MCHC 34.2 30.0 - 36.0 g/dL   RDW 13.5 11.5 - 15.5 %   Platelets 326 150 - 400 K/uL   nRBC 0.0 0.0 - 0.2  %    Comment: Performed at [facility], [address] [place], [place] [address]  Magnesium     Status: None   Collection Time: 12/03/19  5:10 AM  Result Value Ref Range   Magnesium 2.3 1.7 - 2.4 mg/dL    Comment: Performed at [facility], [address] [place], [place] [address]  Glucose, capillary     Status: Abnormal   Collection Time: 12/03/19  7:39 AM  Result Value Ref Range   Glucose-Capillary 271 (H) 70 - 99 mg/dL    Comment: Glucose reference range applies only to samples taken after  fasting for at least 8 hours.   Comment 1 Notify RN   Glucose, capillary     Status: Abnormal   Collection Time: 12/03/19 11:56 AM  Result Value Ref Range   Glucose-Capillary 197 (H) 70 - 99 mg/dL    Comment: Glucose reference range applies only to samples taken after fasting for at least 8 hours.  Glucose, capillary     Status: Abnormal   Collection Time: 12/03/19  4:38 PM  Result Value Ref Range   Glucose-Capillary 298 (H) 70 - 99 mg/dL    Comment: Glucose reference range applies only to samples taken after fasting for at least 8 hours.   Comment 1 Notify RN   Glucose, capillary     Status: Abnormal   Collection Time: 12/03/19  9:02 PM  Result Value Ref Range   Glucose-Capillary 141 (H) 70 - 99 mg/dL    Comment: Glucose reference range applies only to samples taken after fasting for at least 8 hours.  Basic metabolic panel     Status: Abnormal   Collection Time: 12/04/19  5:20 AM  Result Value Ref Range   Sodium 141 135 - 145 mmol/L   Potassium 3.6 3.5 - 5.1 mmol/L   Chloride 106 98 - 111 mmol/L   CO2 25 22 - 32 mmol/L   Glucose, Bld 122 (H) 70 - 99 mg/dL    Comment: Glucose reference range applies only to samples taken after fasting for at least 8 hours.   BUN 22 (H) 6 - 20 mg/dL   Creatinine, Ser 0.64 0.44 - 1.00 mg/dL   Calcium 9.0 8.9 - 10.3 mg/dL   GFR calc non Af Amer >60 >60 mL/min   GFR calc Af Amer >60 >60 mL/min   Anion gap 10 5 - 15    Comment:  Performed at [facility], [address] [place], [place] [address]  CBC     Status: Abnormal   Collection Time: 12/04/19  5:20 AM  Result Value Ref Range   WBC 15.3 (H) 4.0 - 10.5 K/uL   RBC 4.19 3.87 - 5.11 MIL/uL   Hemoglobin 13.1 12.0 - 15.0 g/dL   HCT 38.4 36 - 46 %   MCV 91.6 80.0 - 100.0 fL   MCH 31.3 26.0 - 34.0 pg   MCHC 34.1 30.0 - 36.0 g/dL   RDW 13.6 11.5 - 15.5 %   Platelets 300 150 - 400 K/uL   nRBC 0.0 0.0 - 0.2 %    Comment: Performed at [facility], [address], [place], [place] [address]  Magnesium     Status: None   Collection Time: 12/04/19  5:20 AM  Result Value Ref Range   Magnesium 2.1 1.7 - 2.4 mg/dL    Comment: Performed at [facility], [address] [place],  [address]  Glucose, capillary     Status: Abnormal   Collection Time: 12/04/19  7:42 AM  Result Value Ref Range   Glucose-Capillary 109 (H) 70 - 99 mg/dL    Comment: Glucose reference range applies only to samples taken after fasting for at least 8 hours.  Glucose, capillary     Status: Abnormal   Collection Time: 12/04/19 11:17 AM  Result Value Ref Range   Glucose-Capillary 160 (H) 70 - 99 mg/dL    Comment: Glucose reference range applies only to samples taken after fasting for at least 8 hours.    Radiology: DG Chest 2 View  Result Date: 12/02/2019 CLINICAL DATA:  Chest pain. EXAM: CHEST - 2 VIEW COMPARISON:  10/11/2019. FINDINGS: Mediastinum and hilar structures normal. Heart size normal. Prominent cardiac fat pad noted. No focal infiltrate. No pleural effusion or pneumothorax. Degenerative change thoracic spine. IMPRESSION: No acute cardiopulmonary disease. Electronically Signed   By: Marcello Moores  Register   On: 12/02/2019 13:37    No results found.  No results found.    Assessment and Plan: Patient Active Problem List   Diagnosis Date Noted  . Asthma exacerbation 12/02/2019  . Sore throat 12/01/2019  . Encounter for long-term (current) use of  medications 04/14/2019  . Encounter for screening mammogram for malignant neoplasm of breast 03/02/2019  . Encounter for therapeutic drug level monitoring 03/02/2019  . Gastroesophageal reflux disease without esophagitis 03/02/2019  . Mastitis in female 02/21/2019  . Severe persistent asthma without complication 78/29/5621  . Encounter for general adult medical examination with abnormal findings 10/08/2018  . Dysuria 10/08/2018  . 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 Family history of colon cancer 05/01/2018  . Pulmonary nodule 05/01/2018  . Neoplasm of uncertain behavior of skin of face 05/01/2018  . Acute upper respiratory infection 01/22/2018  . Generalized anxiety disorder 01/22/2018  . Venous stasis ulcer of left lower leg with edema of left lower leg (HCC) 12/07/2017  . Needs flu shot 12/07/2017  . Uncontrolled type 2 diabetes mellitus with hyperglycemia ([place]) 06/08/2017  . Atopic dermatitis 06/08/2017  . Severe asthma without complication 30/86/5784  . Hypokalemia 06/08/2017  . Influenza A 04/22/2016  . Essential hypertension 04/21/2016  . Diabetes ([place]) 04/21/2016  . GERD (gastroesophageal reflux disease) 04/21/2016  . Pneumonia 02/26/2016  . Severe persistent asthma with acute exacerbation 02/21/2016  . Primary osteoarthritis of left knee 12/27/2014  . DDD (degenerative disc disease), lumbar 09/28/2013  . Lumbar radiculitis 09/28/2013    1. COPD/Asthma start trelegy but needs to check for glaucoma being open angle versus closed angle.  I think it should be safe to place her on a trilogy inhaler she is lacking the anticholinergic effect.  I did review her pulmonary functions and she has had a steady decline in her PFT progressing towards COPD at this point 2. GERD this is under control she will continue with current regimen we will continue to follow along. 3. Obesity BMI 34.97 weight loss of course will be beneficial she needs to work on  restricting her caloric intake as discussed below   Obesity Counseling: Risk Assessment: An assessment of behavioral risk factors was made today and includes lack of exercise sedentary lifestyle, lack of portion control and poor dietary habits.  Risk Modification Advice: She was counseled on portion control guidelines. Restricting daily caloric intake to. . The detrimental long term effects of obesity on her health and ongoing poor compliance was also discussed with the patient.    General Counseling: I have discussed the findings of the evaluation and examination with Neoma Laming.  I have also discussed any further diagnostic evaluation thatmay be needed or ordered today. Shanise verbalizes understanding of the findings of todays visit. We also reviewed her medications today and discussed drug interactions and side effects including but not limited excessive drowsiness and altered mental states. We also discussed that there is always a risk not just to her but also people around her. she has been encouraged to call the office with any questions or concerns that should arise related to todays visit.  No orders of the defined types were placed in this encounter.  Time spent: 15  I have personally obtained a history, examined the patient, evaluated laboratory and imaging results, formulated the assessment and plan and placed orders.    Allyne Gee, MD [facility] Pulmonary and Critical Care Sleep medicine

## 2020-01-31 NOTE — Patient Instructions (Signed)
Asthma, Adult  Asthma is a long-term (chronic) condition in which the airways get tight and narrow. The airways are the breathing passages that lead from the nose and mouth down into the lungs. A person with asthma will have times when symptoms get worse. These are called asthma attacks. They can cause coughing, whistling sounds when you breathe (wheezing), shortness of breath, and chest pain. They can make it hard to breathe. There is no cure for asthma, but medicines and lifestyle changes can help control it. There are many things that can bring on an asthma attack or make asthma symptoms worse (triggers). Common triggers include:  Mold.  Dust.  Cigarette smoke.  Cockroaches.  Things that can cause allergy symptoms (allergens). These include animal skin flakes (dander) and pollen from trees or grass.  Things that pollute the air. These may include household cleaners, wood smoke, smog, or chemical odors.  Cold air, weather changes, and wind.  Crying or laughing hard.  Stress.  Certain medicines or drugs.  Certain foods such as dried fruit, potato chips, and grape juice.  Infections, such as a cold or the flu.  Certain medical conditions or diseases.  Exercise or tiring activities. Asthma may be treated with medicines and by staying away from the things that cause asthma attacks. Types of medicines may include:  Controller medicines. These help prevent asthma symptoms. They are usually taken every day.  Fast-acting reliever or rescue medicines. These quickly relieve asthma symptoms. They are used as needed and provide short-term relief.  Allergy medicines if your attacks are brought on by allergens.  Medicines to help control the body's defense (immune) system. Follow these instructions at home: Avoiding triggers in your home  Change your heating and air conditioning filter often.  Limit your use of fireplaces and wood stoves.  Get rid of pests (such as roaches and  mice) and their droppings.  Throw away plants if you see mold on them.  Clean your floors. Dust regularly. Use cleaning products that do not smell.  Have someone vacuum when you are not home. Use a vacuum cleaner with a HEPA filter if possible.  Replace carpet with wood, tile, or vinyl flooring. Carpet can trap animal skin flakes and dust.  Use allergy-proof pillows, mattress covers, and box spring covers.  Wash bed sheets and blankets every week in hot water. Dry them in a dryer.  Keep your bedroom free of any triggers.  Avoid pets and keep windows closed when things that cause allergy symptoms are in the air.  Use blankets that are made of polyester or cotton.  Clean bathrooms and kitchens with bleach. If possible, have someone repaint the walls in these rooms with mold-resistant paint. Keep out of the rooms that are being cleaned and painted.  Wash your hands often with soap and water. If soap and water are not available, use hand sanitizer.  Do not allow anyone to smoke in your home. General instructions  Take over-the-counter and prescription medicines only as told by your doctor. ? Talk with your doctor if you have questions about how or when to take your medicines. ? Make note if you need to use your medicines more often than usual.  Do not use any products that contain nicotine or tobacco, such as cigarettes and e-cigarettes. If you need help quitting, ask your doctor.  Stay away from secondhand smoke.  Avoid doing things outdoors when allergen counts are high and when air quality is low.  Wear a ski mask   when doing outdoor activities in the winter. The mask should cover your nose and mouth. Exercise indoors on cold days if you can.  Warm up before you exercise. Take time to cool down after exercise.  Use a peak flow meter as told by your doctor. A peak flow meter is a tool that measures how well the lungs are working.  Keep track of the peak flow meter's readings.  Write them down.  Follow your asthma action plan. This is a written plan for taking care of your asthma and treating your attacks.  Make sure you get all the shots (vaccines) that your doctor recommends. Ask your doctor about a flu shot and a pneumonia shot.  Keep all follow-up visits as told by your doctor. This is important. Contact a doctor if:  You have wheezing, shortness of breath, or a cough even while taking medicine to prevent attacks.  The mucus you cough up (sputum) is thicker than usual.  The mucus you cough up changes from clear or white to yellow, green, gray, or bloody.  You have problems from the medicine you are taking, such as: ? A rash. ? Itching. ? Swelling. ? Trouble breathing.  You need reliever medicines more than 2-3 times a week.  Your peak flow reading is still at 50-79% of your personal best after following the action plan for 1 hour.  You have a fever. Get help right away if:  You seem to be worse and are not responding to medicine during an asthma attack.  You are short of breath even at rest.  You get short of breath when doing very little activity.  You have trouble eating, drinking, or talking.  You have chest pain or tightness.  You have a fast heartbeat.  Your lips or fingernails start to turn blue.  You are light-headed or dizzy, or you faint.  Your peak flow is less than 50% of your personal best.  You feel too tired to breathe normally. Summary  Asthma is a long-term (chronic) condition in which the airways get tight and narrow. An asthma attack can make it hard to breathe.  Asthma cannot be cured, but medicines and lifestyle changes can help control it.  Make sure you understand how to avoid triggers and how and when to use your medicines. This information is not intended to replace advice given to you by your health care provider. Make sure you discuss any questions you have with your health care provider. Document Revised:  04/30/2018 Document Reviewed: 04/01/2016 Elsevier Patient Education  2020 [employer]  

## 2020-02-02 ENCOUNTER — Telehealth: Payer: Self-pay

## 2020-02-02 ENCOUNTER — Other Ambulatory Visit: Payer: Self-pay

## 2020-02-02 MED ORDER — SPIRIVA RESPIMAT 1.25 MCG/ACT IN AERS: 2.0000 | INHALATION_SPRAY | Freq: Every day | RESPIRATORY_TRACT | 1 refills | Status: DC

## 2020-02-02 MED ORDER — BUDESONIDE-FORMOTEROL FUMARATE 160-4.5 MCG/ACT IN AERO
1.0000 | INHALATION_SPRAY | Freq: Two times a day (BID) | RESPIRATORY_TRACT | 3 refills | Status: DC
Start: 2020-02-02 — End: 2020-04-24

## 2020-02-02 NOTE — Telephone Encounter (Signed)
Unfortunately not, we can add Spiriva Respimat to her regimen. Those two inhalers are basically the same as using Trelegy.

## 2020-02-02 NOTE — Telephone Encounter (Signed)
Spoke to pt and informed her that we added spiriva inhaler to replace trellegy since patient cant afford it.

## 2020-02-08 ENCOUNTER — Other Ambulatory Visit: Payer: Self-pay

## 2020-02-08 MED ORDER — ALBUTEROL SULFATE (2.5 MG/3ML) 0.083% IN NEBU
2.5000 mg | INHALATION_SOLUTION | Freq: Four times a day (QID) | RESPIRATORY_TRACT | 1 refills | Status: DC | PRN
Start: 2020-02-08 — End: 2020-07-14

## 2020-02-10 ENCOUNTER — Telehealth: Payer: Self-pay

## 2020-02-10 NOTE — Telephone Encounter (Signed)
Diabetes and Heart care plan chronic condition verification completed by provider and faxed back to [facility] at [phone]. Copy  Placed in scan.

## 2020-02-17 ENCOUNTER — Other Ambulatory Visit: Payer: Self-pay

## 2020-02-17 ENCOUNTER — Encounter: Payer: Self-pay | Admitting: Nurse Practitioner

## 2020-02-17 ENCOUNTER — Ambulatory Visit (INDEPENDENT_AMBULATORY_CARE_PROVIDER_SITE_OTHER): Payer: PPO | Admitting: Nurse Practitioner

## 2020-02-17 VITALS — BP 140/80 | HR 100 | Temp 97.6°F | Resp 16 | Ht 63.0 in | Wt 193.6 lb

## 2020-02-17 DIAGNOSIS — M1712 Unilateral primary osteoarthritis, left knee: Secondary | ICD-10-CM

## 2020-02-17 DIAGNOSIS — J4550 Severe persistent asthma, uncomplicated: Secondary | ICD-10-CM

## 2020-02-17 DIAGNOSIS — R300 Dysuria: Secondary | ICD-10-CM

## 2020-02-17 DIAGNOSIS — I10 Essential (primary) hypertension: Secondary | ICD-10-CM

## 2020-02-17 DIAGNOSIS — E1165 Type 2 diabetes mellitus with hyperglycemia: Secondary | ICD-10-CM

## 2020-02-17 DIAGNOSIS — N390 Urinary tract infection, site not specified: Secondary | ICD-10-CM | POA: Diagnosis not present

## 2020-02-17 DIAGNOSIS — Z794 Long term (current) use of insulin: Secondary | ICD-10-CM

## 2020-02-17 LAB — POCT URINALYSIS DIPSTICK
Bilirubin, UA: NEGATIVE
Blood, UA: NEGATIVE
Glucose, UA: NEGATIVE
Nitrite, UA: NEGATIVE
Protein, UA: POSITIVE — AB
Spec Grav, UA: 1.01 (ref 1.010–1.025)
Urobilinogen, UA: 0.2 E.U./dL
pH, UA: 6.5 (ref 5.0–8.0)

## 2020-02-17 LAB — POCT GLYCOSYLATED HEMOGLOBIN (HGB A1C): Hemoglobin A1C: 8.3 % — AB (ref 4.0–5.6)

## 2020-02-17 MED ORDER — NITROFURANTOIN MONOHYD MACRO 100 MG PO CAPS: 100.0000 mg | ORAL_CAPSULE | Freq: Two times a day (BID) | ORAL | 0 refills | Status: DC

## 2020-02-17 MED ORDER — NOVOLOG FLEXPEN 100 UNIT/ML ~~LOC~~ SOPN: PEN_INJECTOR | SUBCUTANEOUS | 11 refills | Status: DC

## 2020-02-17 MED ORDER — BREZTRI AEROSPHERE 160-9-4.8 MCG/ACT IN AERO: 2.0000 | INHALATION_SPRAY | Freq: Two times a day (BID) | RESPIRATORY_TRACT | 0 refills | Status: DC

## 2020-02-17 MED ORDER — BASAGLAR KWIKPEN 100 UNIT/ML ~~LOC~~ SOPN: 10.0000 [IU] | PEN_INJECTOR | Freq: Every day | SUBCUTANEOUS | 0 refills | Status: DC

## 2020-02-17 NOTE — Progress Notes (Signed)
[facility] [place], [place] [address]  Internal MEDICINE  Office Visit Note  Patient Name: Cassie Ross  [id number]  [id number]  Date of Service: 02/17/2020  Chief Complaint  Patient presents with  . Follow-up  . Diabetes  . Gastroesophageal Reflux  . Hyperlipidemia  . Hypertension  . controlled substance form    Reviewed with PT  . Knee Pain    The patient is here for follow up visit.  -continues to have elevated blood sugar. HgbA1c 8.3 today. Have done trial of farxiga which caused negative side effects. She was allergic to metformin. Ozempic caused severe constipation, especially after she had hernia repair surgery. She does use sliding scale insulin prior to meals.  -left knee pain. Mild to moderate swelling present. Has been seen per orthopedics in the past. 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 She did get some injections for this which helped. Saw orthopedic provider in [place] and would like too go back to see same provider for this.  Has had increased urinary frequency and urgency. She has seen her GYN provider. Was told she did have large uterine fibroid and that her uterus was prolapsed. She is not sure if these symptoms are related. She was not having the frequency or feeling of not being able to empty her bladder at the time of her visit with GYN. These symptoms are new and have been present for about a month. She denies dysuria or burning with urination.  -states that she is in "donut hole" for her medication. Dr. Devona Konig gave her samples of trelegy, which did help her breathing. Filling this prescription currently costs over $1000. Was then switched back to spiriva which is over $1800 for single inhaler. She is unable to afford either of these medications at this time.       Current Medication: Outpatient Encounter Medications as of 02/17/2020  Medication Sig  . acetaminophen (TYLENOL) 500 MG tablet Take 1,000 mg by mouth  every 8 (eight) hours as needed for moderate pain.   Marland Kitchen albuterol (PROAIR HFA) 108 (90 Base) MCG/ACT inhaler Inhale 2 puffs into the lungs every 4 (four) hours as needed for wheezing or shortness of breath.  Marland Kitchen albuterol (PROVENTIL) (2.5 MG/3ML) 0.083% nebulizer solution Take 3 mLs (2.5 mg total) by nebulization every 6 (six) hours as needed. USE 1 VIAL IN NEBULIZER EVERY 6 HOURS AS NEEDED  . ALPRAZolam (XANAX) 0.5 MG tablet TAKE 1/2 (ONE-HALF) TABLET BY MOUTH IN THE MORNING AND TAKE 1 TABLET IN THE EVENING. (Patient taking differently: Take 0.25-0.5 mg by mouth 2 (two) times daily. TAKE 1/2 (ONE-HALF) TABLET BY MOUTH IN THE MORNING AND TAKE 1 TABLET IN THE EVENING.)  . aspirin EC 81 MG tablet Take 81 mg by mouth daily.  Marland Kitchen atorvastatin (LIPITOR) 10 MG tablet Take 1 tablet (10 mg total) by mouth daily.  . Blood Glucose Monitoring Suppl (ONETOUCH VERIO) w/Device KIT Use as directed diag  . budesonide-formoterol (SYMBICORT) 160-4.5 MCG/ACT inhaler Inhale 1 puff into the lungs 2 (two) times daily.  . Chlorpheniramine-APAP 2-325 MG TABS Take 2 tablets by mouth daily as needed (cold symptoms).   Marland Kitchen diltiazem (CARDIZEM CD) 180 MG 24 hr capsule Take 1 capsule (180 mg total) by mouth 2 (two) times daily.  Marland Kitchen doxycycline (VIBRA-TABS) 100 MG tablet Take 1 tablet (100 mg total) by mouth 2 (two) times daily.  Marland Kitchen Fexofenadine-Pseudoephedrine (ALLEGRA-D 24 HOUR PO) Take by mouth at bedtime.  . fluticasone (FLONASE) 50 MCG/ACT  nasal spray Place 1 spray into both nostrils every morning.  . furosemide (LASIX) 80 MG tablet Take 80 mg by mouth 2 (two) times daily.  Marland Kitchen ibuprofen (ADVIL,MOTRIN) 200 MG tablet Take 200 mg by mouth every 8 (eight) hours as needed (pain).   Marland Kitchen ipratropium-albuterol (DUONEB) 0.5-2.5 (3) MG/3ML SOLN USE 1 AMPULE IN NEBULIZER EVERY 4 HOURS AS NEEDED  . magic mouthwash w/lidocaine SOLN Take 5 mLs by mouth 4 (four) times daily as needed for mouth pain.  . methylPREDNISolone (MEDROL DOSEPAK) 4 MG TBPK  tablet Medrol dosepak steroid taper.  Take per package directions  . montelukast (SINGULAIR) 10 MG tablet Take 1 tablet (10 mg total) by mouth at bedtime.  . Multiple Vitamins-Minerals (ONE-A-DAY MENOPAUSE FORMULA PO) Take 1 tablet by mouth daily.  . pantoprazole (PROTONIX) 40 MG tablet Take 40 mg by mouth 2 (two) times daily.   . potassium chloride (KLOR-CON) 10 MEQ tablet Take 1 tablet (10 mEq total) by mouth 2 (two) times daily.  . theophylline (THEODUR) 300 MG 12 hr tablet Take 1 tablet (300 mg total) by mouth 2 (two) times daily.  . [DISCONTINUED] dapagliflozin propanediol (FARXIGA) 5 MG TABS tablet Take 1 tablet (5 mg total) by mouth daily before breakfast. For dm  . [DISCONTINUED] Fluticasone-Umeclidin-Vilant (TRELEGY ELLIPTA) 100-62.5-25 MCG/INH AEPB Inhale 1 puff into the lungs daily.  . [DISCONTINUED] insulin regular (HUMULIN R) 100 units/mL injection Sliding scale - use as directed QAC per sliding scale instructions. Max daily dose is 25 units in 24 hours. E11.65  . [DISCONTINUED] Semaglutide, 1 MG/DOSE, (OZEMPIC, 1 MG/DOSE,) 2 MG/1.5ML SOPN Inject 0.75 mLs (1 mg total) into the skin once a week.  . [DISCONTINUED] Tiotropium Bromide Monohydrate (SPIRIVA RESPIMAT) 1.25 MCG/ACT AERS Inhale 2 puffs into the lungs daily.  . Budeson-Glycopyrrol-Formoterol (BREZTRI AEROSPHERE) 160-9-4.8 MCG/ACT AERO Inhale 2 puffs into the lungs 2 (two) times daily.  . insulin aspart (NOVOLOG FLEXPEN) 100 UNIT/ML FlexPen Sliding scale - use as directed QAC per sliding scale instructions. Max daily dose is 25 units in 24 hours. E11.65  . Insulin Glargine (BASAGLAR KWIKPEN) 100 UNIT/ML Inject 10 Units into the skin daily.  . nitrofurantoin, macrocrystal-monohydrate, (MACROBID) 100 MG capsule Take 1 capsule (100 mg total) by mouth 2 (two) times daily.   No facility-administered encounter medications on file as of 02/17/2020.    Surgical History: Past Surgical History:  Procedure Laterality Date  . cataract  surgery    . CESAREAN SECTION    . CHOLECYSTECTOMY    . COLONOSCOPY WITH PROPOFOL N/A 01/06/2018   Procedure: COLONOSCOPY WITH PROPOFOL;  Surgeon: Lollie Sails, MD;  Location: [facility] ENDOSCOPY;  Service: Endoscopy;  Laterality: N/A;  . ESOPHAGEAL DILATION    . ESOPHAGOGASTRODUODENOSCOPY (EGD) WITH PROPOFOL N/A 01/06/2018   Procedure: ESOPHAGOGASTRODUODENOSCOPY (EGD) WITH PROPOFOL;  Surgeon: Lollie Sails, MD;  Location: [facility] ENDOSCOPY;  Service: Endoscopy;  Laterality: N/A;  . HERNIA REPAIR    . hiatial hernia repair    . NASAL SINUS SURGERY      Medical History: Past Medical History:  Diagnosis Date  . Asthma   . CHF (congestive heart failure) ([place])    1991- unknown after asthma attack  . Chicken pox   . COPD (chronic obstructive pulmonary disease) ([place])   . Diabetes ([place])   . DVT (deep venous thrombosis) ([place])   . GERD (gastroesophageal reflux disease)   . Hernia, hiatal   . HLD (hyperlipidemia)   . Hypertension   . Hypertension   . Seasonal allergies  Family History: Family History  Problem Relation Age of Onset  . Hypertension Mother   . Lung cancer Mother   . Colon cancer Father   . Colon polyps Sister   . Diabetes Sister     Social History   Socioeconomic History  . Marital status: Married    Spouse name: Not on file  . Number of children: Not on file  . Years of education: Not on file  . Highest education level: Not on file  Occupational History  . Not on file  Tobacco Use  . Smoking status: Never Smoker  . Smokeless tobacco: Never Used  Substance and Sexual Activity  . Alcohol use: Yes    Comment: glass of wine rarely  . Drug use: No  . Sexual activity: Not on file  Other Topics Concern  . Not on file  Social History Narrative   Lives at home with family. Independent   Social Determinants of Health   Financial Resource Strain: Not on file  Food Insecurity: Not on file  Transportation Needs: Not on file  Physical Activity: Not on  file  Stress: Not on file  Social Connections: Not on file  Intimate Partner Violence: Not on file      Review of Systems  Constitutional: Negative for chills, fatigue and unexpected weight change.  HENT: Positive for postnasal drip. Negative for congestion, rhinorrhea, sneezing and sore throat.   Eyes: Negative for redness.  Respiratory: Positive for shortness of breath and wheezing. Negative for cough and chest tightness.        Currently, COPD is well managed.   Cardiovascular: Negative for chest pain and palpitations.  Gastrointestinal: Negative for abdominal pain, constipation, diarrhea, nausea and vomiting.  Endocrine: Negative for cold intolerance, heat intolerance, polydipsia and polyuria.       Blood sugars continue to be elevated   Genitourinary: Positive for dysuria, frequency and urgency.  Musculoskeletal: Negative for arthralgias, back pain, joint swelling and neck pain.  Skin: Negative for rash.  Allergic/Immunologic: Positive for environmental allergies.  Neurological: Negative for dizziness, tremors, numbness and headaches.  Hematological: Negative for adenopathy. Does not bruise/bleed easily.  Psychiatric/Behavioral: Negative for behavioral problems (Depression), sleep disturbance and suicidal ideas. The patient is not nervous/anxious.     Today's Vitals   02/17/20 1136  BP: 140/80  Pulse: 100  Resp: 16  Temp: 97.6 F (36.4 C)  SpO2: 97%  Weight: 193 lb 9.6 oz (87.8 kg)  Height: _0  (1.6 m)   Body mass index is 34.29 kg/m.  Physical Exam Vitals and nursing note reviewed.  Constitutional:      General: She is not in acute distress.    Appearance: Normal appearance. She is well-developed and well-nourished. She is not diaphoretic.  HENT:     Head: Normocephalic and atraumatic.     Nose: Nose normal.     Mouth/Throat:     Mouth: Oropharynx is clear and moist.     Pharynx: No oropharyngeal exudate.  Eyes:     Extraocular Movements: EOM normal.      Pupils: Pupils are equal, round, and reactive to light.  Neck:     Thyroid: No thyromegaly.     Vascular: No carotid bruit or JVD.     Trachea: No tracheal deviation.  Cardiovascular:     Rate and Rhythm: Normal rate and regular rhythm.     Heart sounds: Normal heart sounds. No murmur heard. No friction rub. No gallop.   Pulmonary:  Effort: Pulmonary effort is normal. No respiratory distress.     Breath sounds: Wheezing present. No rales.  Chest:     Chest wall: No tenderness.  Abdominal:     General: Bowel sounds are normal.     Palpations: Abdomen is soft.     Tenderness: There is no abdominal tenderness.  Genitourinary:    Comments: U/a positive for protein and moderate WBC Musculoskeletal:        General: Normal range of motion.     Cervical back: Normal range of motion and neck supple.  Lymphadenopathy:     Cervical: No cervical adenopathy.  Skin:    General: Skin is warm and dry.  Neurological:     General: No focal deficit present.     Mental Status: She is alert and oriented to person, place, and time.     Cranial Nerves: No cranial nerve deficit.  Psychiatric:        Mood and Affect: Mood and affect normal.        Behavior: Behavior normal.        Thought Content: Thought content normal.        Judgment: Judgment normal.    Assessment/Plan: 1. Type 2 diabetes mellitus with hyperglycemia, with long-term current use of insulin (HCC) - POCT HgB A1C 8.3 today. Patient unable to tolerate Farxiga or ozempic due to negative side effects. Start basaglar 10units every day. Continue regular insulin three times a day prior to meals as indicated per sliding scale. Monitor blood sugars closely. Patient to bring blood sugar log to next visit. Adjust dosing as indicated.  - insulin aspart (NOVOLOG FLEXPEN) 100 UNIT/ML FlexPen; Sliding scale - use as directed QAC per sliding scale instructions. Max daily dose is 25 units in 24 hours. E11.65  Dispense: 15 mL; Refill: 11 - Insulin  Glargine (BASAGLAR KWIKPEN) 100 UNIT/ML; Inject 10 Units into the skin daily.  Dispense: 3 mL; Refill: 0  2. Severe persistent asthma without complication Trial of Breztri - use 2 inhalations twice daily. Continue to use rescue inhaler and breathing treatments as needed and as prescribed. Samples Breztri provided today.  - Budeson-Glycopyrrol-Formoterol (BREZTRI AEROSPHERE) 160-9-4.8 MCG/ACT AERO; Inhale 2 puffs into the lungs 2 (two) times daily.  Dispense: 11 g; Refill: 0  3. Urinary tract infection without hematuria, site unspecified Start macrobid 124m twice daily for 10 days. Send urine for culture and sensitivity and adjust antibiotics as indicated.  - nitrofurantoin, macrocrystal-monohydrate, (MACROBID) 100 MG capsule; Take 1 capsule (100 mg total) by mouth 2 (two) times daily.  Dispense: 20 capsule; Refill: 0  4. Essential hypertension Stable. Continue bp medication as prescribed  5. Primary osteoarthritis of left knee [place]  - Ambulatory referral to Orthopedic Surgery  6. Dysuria - POCT Urinalysis Dipstick - CULTURE, URINE COMPREHENSIVE    General Counseling: DMikellverbalizes understanding of the findings of todays visit and agrees with plan of treatment. I have discussed any further diagnostic evaluation that may be needed or ordered today. We also reviewed her medications today. she has been encouraged to call the office with any questions or concerns that should arise related to todays visit.  Diabetes Counseling:  1. Addition of ACE inh/ ARB'S for nephroprotection. Microalbumin is updated  2. Diabetic foot care, prevention of complications. Podiatry consult 3. Exercise and lose weight.  4. Diabetic eye examination, Diabetic eye exam is updated  5. Monitor blood sugar closlely. nutrition counseling.  6. Sign and symptoms  of hypoglycemia including shaking sweating,confusion and headaches.  This patient was seen by  Leretha Pol FNP Collaboration with Dr Lavera Guise as a part of collaborative care agreement  Orders Placed This Encounter  Procedures  . CULTURE, URINE COMPREHENSIVE  . Ambulatory referral to Orthopedic Surgery  . POCT HgB A1C  . POCT Urinalysis Dipstick    Meds ordered this encounter  Medications  . nitrofurantoin, macrocrystal-monohydrate, (MACROBID) 100 MG capsule    Sig: Take 1 capsule (100 mg total) by mouth 2 (two) times daily.    Dispense:  20 capsule    Refill:  0    Order Specific Question:   Supervising Provider    Answer:   Lavera Guise [[id number]]  . insulin aspart (NOVOLOG FLEXPEN) 100 UNIT/ML FlexPen    Sig: Sliding scale - use as directed QAC per sliding scale instructions. Max daily dose is 25 units in 24 hours. E11.65    Dispense:  15 mL    Refill:  11    Order Specific Question:   Supervising Provider    Answer:   Lavera Guise [[id number]]  . Budeson-Glycopyrrol-Formoterol (BREZTRI AEROSPHERE) 160-9-4.8 MCG/ACT AERO    Sig: Inhale 2 puffs into the lungs 2 (two) times daily.    Dispense:  11 g    Refill:  0    Samples provided today    Order Specific Question:   Supervising Provider    Answer:   Lavera Guise [[id number]]  . Insulin Glargine (BASAGLAR KWIKPEN) 100 UNIT/ML    Sig: Inject 10 Units into the skin daily.    Dispense:  3 mL    Refill:  0    Sample provided    Order Specific Question:   Supervising Provider    Answer:   Lavera Guise [[id number]]    Total time spent: 75 Minutes  Time spent includes review of chart, medications, test results, and follow up plan with the patient.      Dr Lavera Guise Internal medicine

## 2020-02-22 LAB — CULTURE, URINE COMPREHENSIVE

## 2020-03-08 ENCOUNTER — Other Ambulatory Visit: Payer: Self-pay

## 2020-03-08 MED ORDER — IPRATROPIUM-ALBUTEROL 0.5-2.5 (3) MG/3ML IN SOLN: RESPIRATORY_TRACT | 0 refills | Status: DC

## 2020-03-14 ENCOUNTER — Other Ambulatory Visit: Payer: Self-pay | Admitting: Nurse Practitioner

## 2020-03-14 ENCOUNTER — Other Ambulatory Visit: Payer: Self-pay

## 2020-03-14 DIAGNOSIS — J4550 Severe persistent asthma, uncomplicated: Secondary | ICD-10-CM

## 2020-03-14 DIAGNOSIS — F411 Generalized anxiety disorder: Secondary | ICD-10-CM

## 2020-03-14 MED ORDER — THEOPHYLLINE ER 300 MG PO TB12: 300.0000 mg | ORAL_TABLET | Freq: Two times a day (BID) | ORAL | 1 refills | Status: DC

## 2020-03-14 MED ORDER — ALPRAZOLAM 0.5 MG PO TABS: ORAL_TABLET | ORAL | 1 refills | Status: DC

## 2020-03-20 ENCOUNTER — Ambulatory Visit: Payer: PPO | Admitting: Nurse Practitioner

## 2020-03-20 ENCOUNTER — Other Ambulatory Visit: Payer: Self-pay

## 2020-03-20 MED ORDER — DILTIAZEM HCL ER COATED BEADS 180 MG PO CP24: 180.0000 mg | ORAL_CAPSULE | Freq: Two times a day (BID) | ORAL | 1 refills | Status: DC

## 2020-03-21 ENCOUNTER — Encounter: Payer: Self-pay | Admitting: Obstetrics & Gynecology

## 2020-03-21 ENCOUNTER — Other Ambulatory Visit: Payer: Self-pay

## 2020-03-21 ENCOUNTER — Other Ambulatory Visit (HOSPITAL_COMMUNITY)
Admission: RE | Admit: 2020-03-21 | Discharge: 2020-03-21 | Disposition: A | Discharge status: Home / Self Care | Payer: HMO | Source: Ambulatory Visit | Attending: Obstetrics & Gynecology | Admitting: Obstetrics & Gynecology

## 2020-03-21 ENCOUNTER — Ambulatory Visit (INDEPENDENT_AMBULATORY_CARE_PROVIDER_SITE_OTHER): Payer: HMO | Admitting: Obstetrics & Gynecology

## 2020-03-21 VITALS — BP 128/90 | Ht 63.0 in | Wt 197.0 lb

## 2020-03-21 DIAGNOSIS — N8111 Cystocele, midline: Secondary | ICD-10-CM

## 2020-03-21 DIAGNOSIS — N393 Stress incontinence (female) (male): Secondary | ICD-10-CM | POA: Diagnosis not present

## 2020-03-21 DIAGNOSIS — N814 Uterovaginal prolapse, unspecified: Secondary | ICD-10-CM | POA: Diagnosis not present

## 2020-03-21 DIAGNOSIS — Z124 Encounter for screening for malignant neoplasm of cervix: Secondary | ICD-10-CM | POA: Diagnosis not present

## 2020-03-21 DIAGNOSIS — R300 Dysuria: Secondary | ICD-10-CM

## 2020-03-21 DIAGNOSIS — N816 Rectocele: Secondary | ICD-10-CM | POA: Diagnosis not present

## 2020-03-21 LAB — POCT URINALYSIS DIPSTICK: Protein, UA: POSITIVE — AB

## 2020-03-21 NOTE — Addendum Note (Signed)
Addended by: Gae Dry on: 03/21/2020 03:36 PM   Modules accepted: Orders

## 2020-03-21 NOTE — Patient Instructions (Addendum)
Thank you for choosing [facility]. As part of our ongoing efforts to improve patient experience, we would appreciate your feedback. Please fill out the short survey that you will receive by mail or MyChart. Your opinion is important to [place]! -Dr Kenton Kingfisher  Recommendations to boost your immunity to prevent illness such as viral flu and colds, including covid19, are as follows:    Vitamin K2 and Vitamin D3. Take Vitamin K2 at 200-300 mcg daily (usually 2-3 pills daily of the over the counter formulation). Take Vitamin D3 at 3000-4000 U daily (usually 3-4 pills daily of the over the counter formulation). Studies show that these two at high normal levels in your system are very effective in keeping your immunity so strong and protective that you will be unlikely to contract viral illness such as those listed above.  Dr Kenton Kingfisher   Vaginal Hysterectomy  A vaginal hysterectomy is a procedure to remove all or part of the uterus through a small incision in the vagina. In this procedure, your health care provider may remove your entire uterus, including the cervix. The cervix is the opening and bottom part of the uterus and is located between the vagina and the uterus. Sometimes, the ovaries and fallopian tubes are also removed. This surgery may be done to treat problems such as:  Noncancerous growths in the uterus (uterine fibroids) that cause symptoms.  A condition that causes the lining of the uterus to grow in other areas (endometriosis).  Problems with pelvic support.  Cancer of the cervix, ovaries, uterus, or tissue that lines the uterus (endometrium).  Excessive bleeding in the uterus. When removing your uterus, your health care provider may also remove the ovaries and the fallopian tubes. After this procedure, you will no longer be able to have a baby, and you will no longer have a menstrual period. Tell a health care provider about:  Any allergies you have.  All medicines you are taking,  including vitamins, herbs, eye drops, creams, and over-the-counter medicines.  Any problems you or family members have had with anesthetic medicines.  Any blood disorders you have.  Any surgeries you have had.  Any medical conditions you have.  Whether you are pregnant or may be pregnant. What are the risks? Generally, this is a safe procedure. However, problems may occur, including:  Bleeding.  Infection.  Blood clots in the legs or lungs.  Damage to nearby structures or organs.  Pain during sex.  Allergic reactions to medicines. What happens before the procedure? Staying hydrated Follow instructions from your health care provider about hydration, which may include:  Up to 2 hours before the procedure - you may continue to drink clear liquids, such as water, clear fruit juice, black coffee, and plain tea.   Eating and drinking restrictions Follow instructions from your health care provider about eating and drinking, which may include:  8 hours before the procedure - stop eating heavy meals or foods, such as meat, fried foods, or fatty foods.  6 hours before the procedure - stop eating light meals or foods, such as toast or cereal.  6 hours before the procedure - stop drinking milk or drinks that contain milk.  2 hours before the procedure - stop drinking clear liquids. Medicines  Ask your health care provider about: ? Changing or stopping your regular medicines. This is especially important if you are taking diabetes medicines or blood thinners. ? Taking medicines such as aspirin and ibuprofen. These medicines can thin your blood. Do not  take these medicines unless your health care provider tells you to take them. ? Taking over-the-counter medicines, vitamins, herbs, and supplements.  You may be asked to take a medicine to empty your colon (bowel preparation). General instructions  If you were asked to do a bowel preparation before the procedure, follow instructions  from your health care provider.  This procedure can affect the way you feel about yourself. Talk with your health care provider about the physical and emotional changes hysterectomy may cause.  Do not use any products that contain nicotine or tobacco for at least 4 weeks before the procedure. These products include cigarettes, e-cigarettes, and chewing tobacco. If you need help quitting, ask your health care provider.  Plan to have a responsible adult take you home from the hospital or clinic.  Plan to have a responsible adult care for you for the time you are told after you leave the hospital or clinic. This is important. Surgery safety Ask your health care provider:  How your surgery site will be marked.  What steps will be taken to help prevent infection. These may include: ? Removing hair at the surgery site. ? Washing skin with a germ-killing soap. ? Receiving antibiotic medicine. What happens during the procedure?  An IV will be inserted into one of your veins.  You will be given one or more of the following: ? A medicine to help you relax (sedative). ? A medicine to numb the area (local anesthetic). ? A medicine to make you fall asleep (general anesthetic). ? A medicine that is injected into your spine to numb the area below and slightly above the injection site (spinal anesthetic). ? A medicine that is injected into an area of your body to numb everything below the injection site (regional anesthetic).  Your surgeon will make an incision in your vagina.  Your surgeon will locate and remove all or part of your uterus. Part or all of the uterus will be removed through the vagina.  Your ovaries and fallopian tubes may be removed at the same time.  The incision in your vagina will be closed with stitches (sutures) that dissolve over time. The procedure may vary among health care providers and hospitals. What happens after the procedure?  Your blood pressure, heart rate,  breathing rate, and blood oxygen level will be monitored until you leave the hospital or clinic.  You will be encouraged to walk as soon as possible. You will also use a device or do breathing exercises to keep your lungs clear.  You may have to wear compression stockings. These stockings help to prevent blood clots and reduce swelling in your legs.  You will be given pain medicine as needed.  You will need to wear a sanitary pad for vaginal discharge or bleeding. Summary  A vaginal hysterectomy is a procedure to remove all or part of the uterus through the vagina.  You may need a vaginal hysterectomy to treat a variety of abnormalities of the uterus.  Plan to have a responsible adult take you home from the hospital or clinic.  Plan to have a responsible adult care for you for the time you are told after you leave the hospital or clinic. This is important. This information is not intended to replace advice given to you by your health care provider. Make sure you discuss any questions you have with your health care provider. Document Revised: 10/29/2019 Document Reviewed: 10/29/2019 Elsevier Patient Education  [place].

## 2020-03-21 NOTE — Progress Notes (Signed)
Cystocele/Rectocele Patient is a G3P3 (all NSVD) WF who complains symptoms related to uterine prolapse and cystocele and rectocele. Problem started several months if not yerar ago, but worsened significantly in Sept after an exacerbation of her COPD/ Asthma.  She feels pressure, and has urinary incontinence.  Denies freq or dysuria or urgency. Symptoms include: prolapse of tissue with straining, urinary incontinence: moderate and discomfort: moderate. Symptoms have worsened, beginning 4 months ago.  Longstanding h/o asthma, frequent hospitaliztions and also steroid use.  Also has diabetes.  Prior hernia surgeries due to her chronic cough and wheeze.  Not currently sexually active due to these sx's but wants to be.  PMHx: She  has a past medical history of Asthma, CHF (congestive heart failure) ([place]), Chicken pox, COPD (chronic obstructive pulmonary disease) ([place]), Diabetes ([employer]), DVT (deep venous thrombosis) ([employer]), GERD (gastroesophageal reflux disease), Hernia, hiatal, HLD (hyperlipidemia), Hypertension, Hypertension, and Seasonal allergies. Also,  has a past surgical history that includes Hernia repair; Cholecystectomy; Nasal sinus surgery; Cesarean section; Esophageal dilation; cataract surgery; hiatial hernia repair; Colonoscopy with propofol (N/A, 01/06/2018); and Esophagogastroduodenoscopy (egd) with propofol (N/A, 01/06/2018)., family history includes Colon cancer in her father; Colon polyps in her sister; Diabetes in her sister; Hypertension in her mother; Lung cancer in her mother.,  reports that she has never smoked. She has never used smokeless tobacco. She reports current alcohol use. She reports that she does not use drugs.  She has a current medication list which includes the following prescription(s): acetaminophen, albuterol, albuterol, alprazolam, aspirin ec, atorvastatin, onetouch verio, breztri aerosphere, budesonide-formoterol, chlorpheniramine-apap, diltiazem, doxycycline,  fexofenadine-pseudoephedrine, fluticasone, furosemide, ibuprofen, novolog flexpen, basaglar kwikpen, ipratropium-albuterol, montelukast, multiple vitamins-minerals, pantoprazole, potassium chloride, and theophylline. Also, is allergic to shellfish allergy, sunflower oil, levaquin [levofloxacin], and penicillins.  Review of Systems  Constitutional: Negative for chills, fever and malaise/fatigue.  HENT: Negative for congestion, sinus pain and sore throat.   Eyes: Negative for blurred vision and pain.  Respiratory: Negative for cough and wheezing.   Cardiovascular: Negative for chest pain and leg swelling.  Gastrointestinal: Negative for abdominal pain, constipation, diarrhea, heartburn, nausea and vomiting.  Genitourinary: Negative for dysuria, frequency, hematuria and urgency.  Musculoskeletal: Negative for back pain, joint pain, myalgias and neck pain.  Skin: Negative for itching and rash.  Neurological: Negative for dizziness, tremors and weakness.  Endo/Heme/Allergies: Does not bruise/bleed easily.  Psychiatric/Behavioral: Negative for depression. The patient is not nervous/anxious and does not have insomnia.     Objective: BP 128/90   Ht 5\' 3"  (1.6 m)   Wt 197 lb (89.4 kg)   BMI 34.90 kg/m  Physical Exam Constitutional:      General: She is not in acute distress.    Appearance: She is well-developed.  Genitourinary:     Bladder, vagina, uterus and urethral meatus normal.     No vaginal erythema or bleeding.      Right Adnexa: not tender and no mass present.    Left Adnexa: not tender and no mass present.    No cervical motion tenderness, discharge, polyp or nabothian cyst.     Uterus is mobile and prolapsed.     Uterus is not enlarged.     No uterine mass detected.    Uterus exam comments: Gr 4 uterine prolapse w valsalva .     Uterus is midaxial.     Pelvic Floor comments: Gr 2 cystocele and rectocele >30 degree q tip test.    Pelvic exam was performed with patient  supine.  HENT:  Head: Normocephalic and atraumatic.     Nose: Nose normal.  Pulmonary:     Effort: Pulmonary effort is normal.     Breath sounds: Examination of the right-upper field reveals wheezing. Examination of the left-upper field reveals wheezing. Examination of the right-middle field reveals wheezing. Examination of the left-middle field reveals wheezing. Examination of the right-lower field reveals wheezing. Examination of the left-lower field reveals wheezing. Wheezing present.  Abdominal:     General: There is no distension.     Palpations: Abdomen is soft.     Tenderness: There is no abdominal tenderness.  Musculoskeletal:        General: Normal range of motion.  Neurological:     Mental Status: She is alert and oriented to person, place, and time.     Cranial Nerves: No cranial nerve deficit.  Skin:    General: Skin is warm and dry.  Psychiatric:        Attention and Perception: Attention normal.        Mood and Affect: Mood and affect normal.        Speech: Speech normal.        Behavior: Behavior normal.        Thought Content: Thought content normal.        Judgment: Judgment normal.     ASSESSMENT/PLAN:    Problem List Items Addressed This Visit      Digestive   Rectocele     Genitourinary   Uterine prolapse   Cystocele, midline     Other   Dysuria - Primary   Relevant Orders   POCT Urinalysis Dipstick (Completed)    Other Visit Diagnoses    Screening for cervical cancer       Relevant Orders   Cytology - PAP    Options discussed, rec TVH BSO AR REPAIR SLING    Rationale and pros/cons discussed    Pessary option discussed, has its limitations, and she wants to be sexually active    Post op bladder function/ dysfunction possibilities discussed  Clearance required due to medical co-morbidities (Dr Humphrey Rolls)  Barnett Applebaum, MD, [facility], [facility] 03/21/2020  3:31 PM

## 2020-03-23 ENCOUNTER — Ambulatory Visit: Payer: HMO | Admitting: Hospice and Palliative Medicine

## 2020-03-23 LAB — CYTOLOGY - PAP: Diagnosis: NEGATIVE

## 2020-03-23 LAB — URINE CULTURE

## 2020-03-29 ENCOUNTER — Telehealth: Payer: Self-pay | Admitting: Obstetrics & Gynecology

## 2020-03-29 DIAGNOSIS — M1712 Unilateral primary osteoarthritis, left knee: Secondary | ICD-10-CM | POA: Diagnosis not present

## 2020-03-29 NOTE — Telephone Encounter (Signed)
Faxed request for medical clearance to both [place] and [employer]

## 2020-04-19 ENCOUNTER — Telehealth: Payer: Self-pay

## 2020-04-19 NOTE — Telephone Encounter (Signed)
-----   Message from Gae Dry, MD sent at 03/21/2020  3:33 PM EST ----- Regarding: Surgery Surgery Booking Request Patient Full Name:  Cassie Ross  MRN: [id number]  DOB: 1960/11/14  Surgeon: Hoyt Koch, MD  Requested Surgery Date and Time: As available and time for med clearance Primary Diagnosis AND Code:    1. Uterine prolapse  N81.4  2. Cystocele, midline  N81.11  3.         Urinary Incontinence   N39.3 5. Rectocele  N81.6  Secondary Diagnosis and Code:  Surgical Procedure: TVH/BSO, AR Repair, TVT pubovaginal sling, Cystoscopy RNFA Requested?: No L&D Notification: No Admission Status: same day surgery Length of Surgery: 100 min Special Case Needs: Yes  (TVT EXACT) H&P: Yes Phone Interview???:  Yes Interpreter: No Medical Clearance:  Yes - PCP/Dr Humphrey Rolls and/or asthma provider Special Scheduling Instructions: No Any known health/anesthesia issues, diabetes, sleep apnea, latex allergy, defibrillator/pacemaker?: Yes - DIABETES Acuity: P3   (P1 highest, P2 delay may cause harm, P3 low, elective gyn, P4 lowest)

## 2020-04-19 NOTE — Telephone Encounter (Signed)
Called patient to schedule TVH/BSO, AR Repair, TVT pubovaginal sling, Cystoscopy w [facility]  Patient has appt scheduled 2/14 for medical clearance.  DOS 3/15  H&P 3/8 @ 2:10   Covid testing 3/14 @ 8-10:30, [facility] [employer], drive up and wear mask. Advised pt to quarantine until DOS.  Pre-admit phone call appointment to be requested - date and time will be included on H&P paper work. Also all appointments will be updated on pt MyChart. Explained that this appointment has a call window. Based on the time scheduled will indicate if the call will be received within a 4 hour window before 1:00 or after.  Advised that pt may also receive calls from the hospital pharmacy and pre-service center.  Confirmed pt has [employer] H[employer] as primary insurance. No secondary insurance.

## 2020-04-19 NOTE — Telephone Encounter (Signed)
Medical clearance faxed to Dr Humphrey Rolls 03/29/20

## 2020-04-24 ENCOUNTER — Encounter: Payer: Self-pay | Admitting: Hospice and Palliative Medicine

## 2020-04-24 ENCOUNTER — Other Ambulatory Visit: Payer: Self-pay

## 2020-04-24 ENCOUNTER — Ambulatory Visit (INDEPENDENT_AMBULATORY_CARE_PROVIDER_SITE_OTHER): Payer: HMO | Admitting: Hospice and Palliative Medicine

## 2020-04-24 VITALS — BP 136/88 | HR 105 | Temp 97.3°F | Resp 16 | Ht 63.0 in | Wt 197.6 lb

## 2020-04-24 DIAGNOSIS — E1165 Type 2 diabetes mellitus with hyperglycemia: Secondary | ICD-10-CM

## 2020-04-24 DIAGNOSIS — Z01818 Encounter for other preprocedural examination: Secondary | ICD-10-CM | POA: Diagnosis not present

## 2020-04-24 DIAGNOSIS — Z6835 Body mass index (BMI) 35.0-35.9, adult: Secondary | ICD-10-CM

## 2020-04-24 DIAGNOSIS — Z794 Long term (current) use of insulin: Secondary | ICD-10-CM

## 2020-04-24 DIAGNOSIS — R0602 Shortness of breath: Secondary | ICD-10-CM

## 2020-04-24 DIAGNOSIS — J449 Chronic obstructive pulmonary disease, unspecified: Secondary | ICD-10-CM | POA: Diagnosis not present

## 2020-04-24 DIAGNOSIS — F411 Generalized anxiety disorder: Secondary | ICD-10-CM | POA: Diagnosis not present

## 2020-04-24 MED ORDER — ALPRAZOLAM 0.5 MG PO TABS: 0.5000 mg | ORAL_TABLET | Freq: Two times a day (BID) | ORAL | 1 refills | Status: DC | PRN

## 2020-04-24 MED ORDER — BUDESONIDE-FORMOTEROL FUMARATE 80-4.5 MCG/ACT IN AERO: 2.0000 | INHALATION_SPRAY | Freq: Two times a day (BID) | RESPIRATORY_TRACT | 3 refills | Status: DC

## 2020-04-24 MED ORDER — SPIRIVA HANDIHALER 18 MCG IN CAPS: 18.0000 ug | ORAL_CAPSULE | Freq: Every day | RESPIRATORY_TRACT | 12 refills | Status: DC

## 2020-04-24 MED ORDER — LANTUS SOLOSTAR 100 UNIT/ML ~~LOC~~ SOPN
10.0000 [IU] | PEN_INJECTOR | Freq: Every day | SUBCUTANEOUS | 3 refills | Status: DC
Start: 2020-04-24 — End: 2020-08-03

## 2020-04-24 NOTE — Progress Notes (Signed)
[facility] [place], [place] [address]  Internal MEDICINE  Office Visit Note  Patient Name: Cassie Ross  [id number]  [id number]  Date of Service: 04/24/2020  Chief Complaint  Patient presents with  . Follow-up    Pre op, medical clearance, discuss meds, heart rate  . Diabetes  . Gastroesophageal Reflux  . Hyperlipidemia  . Hypertension  . Quality Metric Gaps    Miroalbumin, covid    HPI Patient is here for routine follow-up Needs pre-op clearance for total abdominal hysterectomy scheduled for March History of COPD as well as asthma--unable to afford Breztri as well as Trelegy, currently using samples of inhalers currently In the past has used Symbicort which she felt helped control her breathing and did not have financial issues Last PFT 01/12/20--severe obstructive lung disease; FEV1 0.89L, 37% predicted value Continues to take theophylline twice daily--has a history of tachycardia secondary to theophylline and nebulizer therapy use, treated with alprazolam in the past and had remained well controlled Alprazolam dose recently decreased to 0.5 tablet in AM and 1 tablet in PM as she felt she no longer needed full dose in AM Over the last few weeks she has noticed her resting heart rate has been elevated--routinely averaging >100  Contacted by her insurance company and was informed they are no longer providing coverage for Basaglar insulin and would need to be switched to Lantus Currently taking 10 units Basaglar daily and about 4-5 units Novolog with each meal Home glucose readings averaging 150-170--better controlled since completing prednisone taper  Followed by cardiology for idiopathic pulmonary hypertension and hypertension--cardiac CTA 2020 no significant coronary artery disease and normal LV functions Has not had recent echocardiogram   Current Medication: Outpatient Encounter Medications as of 04/24/2020  Medication Sig  . ALPRAZolam  (XANAX) 0.5 MG tablet Take 1 tablet (0.5 mg total) by mouth 2 (two) times daily as needed for anxiety.  . budesonide-formoterol (SYMBICORT) 80-4.5 MCG/ACT inhaler Inhale 2 puffs into the lungs 2 (two) times daily.  . insulin glargine (LANTUS SOLOSTAR) 100 UNIT/ML Solostar Pen Inject 10 Units into the skin daily.  Marland Kitchen tiotropium (SPIRIVA HANDIHALER) 18 MCG inhalation capsule Place 1 capsule (18 mcg total) into inhaler and inhale daily.  Marland Kitchen acetaminophen (TYLENOL) 500 MG tablet Take 1,000 mg by mouth every 8 (eight) hours as needed for moderate pain.   Marland Kitchen albuterol (PROAIR HFA) 108 (90 Base) MCG/ACT inhaler Inhale 2 puffs into the lungs every 4 (four) hours as needed for wheezing or shortness of breath.  Marland Kitchen albuterol (PROVENTIL) (2.5 MG/3ML) 0.083% nebulizer solution Take 3 mLs (2.5 mg total) by nebulization every 6 (six) hours as needed. USE 1 VIAL IN NEBULIZER EVERY 6 HOURS AS NEEDED  . aspirin EC 81 MG tablet Take 81 mg by mouth daily.  Marland Kitchen atorvastatin (LIPITOR) 10 MG tablet Take 1 tablet (10 mg total) by mouth daily.  . Blood Glucose Monitoring Suppl (ONETOUCH VERIO) w/Device KIT Use as directed diag  . Budeson-Glycopyrrol-Formoterol (BREZTRI AEROSPHERE) 160-9-4.8 MCG/ACT AERO Inhale 2 puffs into the lungs 2 (two) times daily.  . Chlorpheniramine-APAP 2-325 MG TABS Take 2 tablets by mouth daily as needed (cold symptoms).   Marland Kitchen diltiazem (CARDIZEM CD) 180 MG 24 hr capsule Take 1 capsule (180 mg total) by mouth 2 (two) times daily.  Marland Kitchen doxycycline (VIBRA-TABS) 100 MG tablet Take 1 tablet (100 mg total) by mouth 2 (two) times daily.  Marland Kitchen Fexofenadine-Pseudoephedrine (ALLEGRA-D 24 HOUR PO) Take by mouth at bedtime.  . fluticasone (  FLONASE) 50 MCG/ACT nasal spray Place 1 spray into both nostrils every morning.  . furosemide (LASIX) 80 MG tablet Take 80 mg by mouth 2 (two) times daily.  Marland Kitchen ibuprofen (ADVIL,MOTRIN) 200 MG tablet Take 200 mg by mouth every 8 (eight) hours as needed (pain).   . insulin aspart  (NOVOLOG FLEXPEN) 100 UNIT/ML FlexPen Sliding scale - use as directed QAC per sliding scale instructions. Max daily dose is 25 units in 24 hours. E11.65  . ipratropium-albuterol (DUONEB) 0.5-2.5 (3) MG/3ML SOLN USE 1 AMPULE IN NEBULIZER EVERY 4 HOURS AS NEEDED  . montelukast (SINGULAIR) 10 MG tablet Take 1 tablet (10 mg total) by mouth at bedtime.  . Multiple Vitamins-Minerals (ONE-A-DAY MENOPAUSE FORMULA PO) Take 1 tablet by mouth daily.  . pantoprazole (PROTONIX) 40 MG tablet Take 40 mg by mouth 2 (two) times daily.   . potassium chloride (KLOR-CON) 10 MEQ tablet Take 1 tablet (10 mEq total) by mouth 2 (two) times daily.  . theophylline (THEODUR) 300 MG 12 hr tablet Take 1 tablet (300 mg total) by mouth 2 (two) times daily.  . [DISCONTINUED] ALPRAZolam (XANAX) 0.5 MG tablet TAKE 1/2 (ONE-HALF) TABLET BY MOUTH IN THE MORNING AND TAKE 1 TABLET IN THE EVENING.  . [DISCONTINUED] budesonide-formoterol (SYMBICORT) 160-4.5 MCG/ACT inhaler Inhale 1 puff into the lungs 2 (two) times daily.  . [DISCONTINUED] Insulin Glargine (BASAGLAR KWIKPEN) 100 UNIT/ML Inject 10 Units into the skin daily.   No facility-administered encounter medications on file as of 04/24/2020.    Surgical History: Past Surgical History:  Procedure Laterality Date  . cataract surgery    . CESAREAN SECTION    . CHOLECYSTECTOMY    . COLONOSCOPY WITH PROPOFOL N/A 01/06/2018   Procedure: COLONOSCOPY WITH PROPOFOL;  Surgeon: Lollie Sails, MD;  Location: [facility] ENDOSCOPY;  Service: Endoscopy;  Laterality: N/A;  . ESOPHAGEAL DILATION    . ESOPHAGOGASTRODUODENOSCOPY (EGD) WITH PROPOFOL N/A 01/06/2018   Procedure: ESOPHAGOGASTRODUODENOSCOPY (EGD) WITH PROPOFOL;  Surgeon: Lollie Sails, MD;  Location: [facility] ENDOSCOPY;  Service: Endoscopy;  Laterality: N/A;  . HERNIA REPAIR    . hiatial hernia repair    . NASAL SINUS SURGERY      Medical History: Past Medical History:  Diagnosis Date  . Asthma   . CHF (congestive heart  failure) ([place])    1991- unknown after asthma attack  . Chicken pox   . COPD (chronic obstructive pulmonary disease) ([place])   . Diabetes ([place])   . DVT (deep venous thrombosis) ([place])   . GERD (gastroesophageal reflux disease)   . Hernia, hiatal   . HLD (hyperlipidemia)   . Hypertension   . Hypertension   . Seasonal allergies     Family History: Family History  Problem Relation Age of Onset  . Hypertension Mother   . Lung cancer Mother   . Colon cancer Father   . Colon polyps Sister   . Diabetes Sister     Social History   Socioeconomic History  . Marital status: Married    Spouse name: Not on file  . Number of children: Not on file  . Years of education: Not on file  . Highest education level: Not on file  Occupational History  . Not on file  Tobacco Use  . Smoking status: Never Smoker  . Smokeless tobacco: Never Used  Substance and Sexual Activity  . Alcohol use: Yes    Comment: glass of wine rarely  . Drug use: No  . Sexual activity: Not on file  Other Topics Concern  . Not on file  Social History Narrative   Lives at home with family. Independent   Social Determinants of Health   Financial Resource Strain: Not on file  Food Insecurity: Not on file  Transportation Needs: Not on file  Physical Activity: Not on file  Stress: Not on file  Social Connections: Not on file  Intimate Partner Violence: Not on file      Review of Systems  Constitutional: Negative for chills, diaphoresis and fatigue.  HENT: Negative for ear pain, postnasal drip and sinus pressure.   Eyes: Negative for photophobia, discharge, redness, itching and visual disturbance.  Respiratory: Positive for shortness of breath. Negative for cough and wheezing.   Cardiovascular: Negative for chest pain, palpitations and leg swelling.  Gastrointestinal: Negative for abdominal pain, constipation, diarrhea, nausea and vomiting.  Genitourinary: Negative for dysuria and flank pain.   Musculoskeletal: Negative for arthralgias, back pain, gait problem and neck pain.  Skin: Negative for color change.  Allergic/Immunologic: Negative for environmental allergies and food allergies.  Neurological: Negative for dizziness and headaches.  Hematological: Does not bruise/bleed easily.  Psychiatric/Behavioral: Negative for agitation, behavioral problems (depression) and hallucinations.    Vital Signs: BP 136/88   Pulse (!) 105   Temp (!) 97.3 F (36.3 C)   Resp 16   Ht 5' 3"  (1.6 m)   Wt 197 lb 9.6 oz (89.6 kg)   SpO2 97%   BMI 35.00 kg/m    Physical Exam Vitals reviewed.  Constitutional:      Appearance: Normal appearance. She is obese.  Cardiovascular:     Rate and Rhythm: Regular rhythm. Tachycardia present.     Pulses: Normal pulses.     Heart sounds: Normal heart sounds.  Pulmonary:     Effort: Pulmonary effort is normal.     Breath sounds: Wheezing present.  Abdominal:     General: Abdomen is flat.     Palpations: Abdomen is soft.  Musculoskeletal:        General: Normal range of motion.     Cervical back: Normal range of motion.  Skin:    General: Skin is warm.  Neurological:     General: No focal deficit present.     Mental Status: She is alert and oriented to person, place, and time. Mental status is at baseline.  Psychiatric:        Mood and Affect: Mood normal.        Behavior: Behavior normal.        Thought Content: Thought content normal.        Judgment: Judgment normal.   Assessment/Plan: 1. Preop testing EKG-NSR 88 bpm, stable spirometry, will recommend cardiology referral Review routine fasting labs for pre op clearance and adjust therapy as indicated - Spirometry with Graph - EKG 12-Lead - CBC w/Diff/Platelet - Comprehensive Metabolic Panel (CMET) - Lipid Panel With LDL/HDL Ratio - TSH + free T4  2. Type 2 diabetes mellitus with hyperglycemia, with long-term current use of insulin ([place]) Discontinue Basaglar and start  Lantus--continue with 10 units daily as well as Nvolog sliding scale - insulin glargine (LANTUS SOLOSTAR) 100 UNIT/ML Solostar Pen; Inject 10 Units into the skin daily.  Dispense: 15 mL; Refill: 3  3. Chronic obstructive pulmonary disease, unspecified COPD type ([place]) Script sent for Symbicort and Spiriva to replace Trelegy/Breztri - budesonide-formoterol (SYMBICORT) 80-4.5 MCG/ACT inhaler; Inhale 2 puffs into the lungs 2 (two) times daily.  Dispense: 1 each; Refill: 3 - tiotropium (SPIRIVA HANDIHALER)  18 MCG inhalation capsule; Place 1 capsule (18 mcg total) into inhaler and inhale daily.  Dispense: 30 capsule; Refill: 12  4. Generalized anxiety disorder Increase dose of Alprazolam to 1 mg BID to help with anxiety and tachycardia likely secondary to theophylline - ALPRAZolam (XANAX) 0.5 MG tablet; Take 1 tablet (0.5 mg total) by mouth 2 (two) times daily as needed for anxiety.  Dispense: 60 tablet; Refill: 1  5. BMI 35.0-35.9,adult Obesity Counseling: Risk Assessment: An assessment of behavioral risk factors was made today and includes lack of exercise sedentary lifestyle, lack of portion control and poor dietary habits.  Risk Modification Advice: She was counseled on portion control guidelines. Restricting daily caloric intake to 1800. The detrimental long term effects of obesity on her health and ongoing poor compliance was also discussed with the patient.  6. SOB (shortness of breath) Spirometry stable--FEV1 0.9L, 37% predicted - Spirometry with Graph  General Counseling: Cassie Ross verbalizes understanding of the findings of todays visit and agrees with plan of treatment. I have discussed any further diagnostic evaluation that may be needed or ordered today. We also reviewed her medications today. she has been encouraged to call the office with any questions or concerns that should arise related to todays visit.    Orders Placed This Encounter  Procedures  . CBC w/Diff/Platelet  .  Comprehensive Metabolic Panel (CMET)  . Lipid Panel With LDL/HDL Ratio  . TSH + free T4  . EKG 12-Lead  . Spirometry with Graph    Meds ordered this encounter  Medications  . budesonide-formoterol (SYMBICORT) 80-4.5 MCG/ACT inhaler    Sig: Inhale 2 puffs into the lungs 2 (two) times daily.    Dispense:  1 each    Refill:  3  . tiotropium (SPIRIVA HANDIHALER) 18 MCG inhalation capsule    Sig: Place 1 capsule (18 mcg total) into inhaler and inhale daily.    Dispense:  30 capsule    Refill:  12  . insulin glargine (LANTUS SOLOSTAR) 100 UNIT/ML Solostar Pen    Sig: Inject 10 Units into the skin daily.    Dispense:  15 mL    Refill:  3  . ALPRAZolam (XANAX) 0.5 MG tablet    Sig: Take 1 tablet (0.5 mg total) by mouth 2 (two) times daily as needed for anxiety.    Dispense:  60 tablet    Refill:  1    Time spent: 30 Minutes Time spent includes review of chart, medications, test results and follow-up plan with the patient.  This patient was seen by Theodoro Grist AGNP-C in Collaboration with Dr Lavera Guise as a part of collaborative care agreement     Tanna Furry. Darvell Monteforte AGNP-C Internal medicine

## 2020-04-25 ENCOUNTER — Telehealth: Payer: Self-pay

## 2020-04-25 NOTE — Telephone Encounter (Signed)
Spoke with kelly from [facility] that pt need Medical clearance from her cardiology

## 2020-04-25 NOTE — Telephone Encounter (Signed)
I rec'd a call from [place] (Dr. Humphrey Rolls) saying that that recommend that patient also receive medical clearance from her cardiologist at [facility].    Jobe Gibbon, MD   [place], [place] [address]   [phone] (Work)   [phone] (Fax)     Wagner, Eldridge Dace, NP     I will fill out request form and have Hugo sign when he is back in clinic 04/27/20.

## 2020-04-25 NOTE — Telephone Encounter (Signed)
I rec'd a call from [place] (Dr. Humphrey Rolls) saying that that recommend that patient also receive medical clearance from her cardiologist at [facility].   Jobe Gibbon, MD  [place], [place] [address]  [phone] (Work)  [phone] (Fax)    [place], Eldridge Dace, NP   I will fill out request form and have [place] sign when he is back in clinic 04/27/20.

## 2020-04-26 ENCOUNTER — Other Ambulatory Visit: Payer: Self-pay

## 2020-04-26 MED ORDER — ONETOUCH VERIO W/DEVICE KIT: PACK | 0 refills | Status: DC

## 2020-04-28 ENCOUNTER — Other Ambulatory Visit: Payer: Self-pay

## 2020-04-28 MED ORDER — ONETOUCH VERIO VI STRP: ORAL_STRIP | 12 refills | Status: DC

## 2020-05-02 NOTE — Telephone Encounter (Signed)
Pt was seen in PCP, Dr Laurelyn Sickle office 04/24/20. They recommended she also get cardio clearance.   Cardio clearance rec'd 05/02/20. Will send to scan into chart.

## 2020-05-02 NOTE — Telephone Encounter (Signed)
Medical clearance was rec'd from Dr. Clayborn Bigness, patient's cardiologist

## 2020-05-04 ENCOUNTER — Other Ambulatory Visit: Payer: Self-pay | Admitting: Nurse Practitioner

## 2020-05-04 NOTE — Telephone Encounter (Signed)
Check this out  °

## 2020-05-10 DIAGNOSIS — Z01818 Encounter for other preprocedural examination: Secondary | ICD-10-CM | POA: Diagnosis not present

## 2020-05-11 LAB — LIPID PANEL WITH LDL/HDL RATIO
Cholesterol, Total: 188 mg/dL (ref 100–199)
HDL: 79 mg/dL (ref >39)
LDL Chol Calc (NIH): 93 mg/dL (ref 0–99)
LDL/HDL Ratio: 1.2 ratio (ref 0.0–3.2)
Triglycerides: 87 mg/dL (ref 0–149)
VLDL Cholesterol Cal: 16 mg/dL (ref 5–40)

## 2020-05-11 LAB — COMPREHENSIVE METABOLIC PANEL
ALT: 17 IU/L (ref 0–32)
AST: 11 IU/L (ref 0–40)
Albumin/Globulin Ratio: 1.6 (ref 1.2–2.2)
Albumin: 3.9 g/dL (ref 3.8–4.8)
Alkaline Phosphatase: 124 IU/L — ABNORMAL HIGH (ref 44–121)
BUN/Creatinine Ratio: 32 — ABNORMAL HIGH (ref 12–28)
BUN: 21 mg/dL (ref 8–27)
Bilirubin Total: 0.4 mg/dL (ref 0.0–1.2)
CO2: 24 mmol/L (ref 20–29)
Calcium: 9.1 mg/dL (ref 8.7–10.3)
Chloride: 106 mmol/L (ref 96–106)
Creatinine, Ser: 0.66 mg/dL (ref 0.57–1.00)
Globulin, Total: 2.4 g/dL (ref 1.5–4.5)
Glucose: 105 mg/dL — ABNORMAL HIGH (ref 65–99)
Potassium: 4.3 mmol/L (ref 3.5–5.2)
Sodium: 145 mmol/L — ABNORMAL HIGH (ref 134–144)
Total Protein: 6.3 g/dL (ref 6.0–8.5)
eGFR: 100 mL/min/{1.73_m2} (ref >59)

## 2020-05-11 LAB — CBC WITH DIFFERENTIAL/PLATELET
Basophils Absolute: 0.1 10*3/uL (ref 0.0–0.2)
Basos: 1 %
EOS (ABSOLUTE): 0.5 10*3/uL — ABNORMAL HIGH (ref 0.0–0.4)
Eos: 5 %
Hematocrit: 40.3 % (ref 34.0–46.6)
Hemoglobin: 13.6 g/dL (ref 11.1–15.9)
Immature Grans (Abs): 0.2 10*3/uL — ABNORMAL HIGH (ref 0.0–0.1)
Immature Granulocytes: 1 %
Lymphocytes Absolute: 2.6 10*3/uL (ref 0.7–3.1)
Lymphs: 25 %
MCH: 30.4 pg (ref 26.6–33.0)
MCHC: 33.7 g/dL (ref 31.5–35.7)
MCV: 90 fL (ref 79–97)
Monocytes Absolute: 0.9 10*3/uL (ref 0.1–0.9)
Monocytes: 8 %
Neutrophils Absolute: 6.4 10*3/uL (ref 1.4–7.0)
Neutrophils: 60 %
Platelets: 320 10*3/uL (ref 150–450)
RBC: 4.48 x10E6/uL (ref 3.77–5.28)
RDW: 13.2 % (ref 11.7–15.4)
WBC: 10.6 10*3/uL (ref 3.4–10.8)

## 2020-05-11 LAB — TSH+FREE T4
Free T4: 1.44 ng/dL (ref 0.82–1.77)
TSH: 1.77 u[IU]/mL (ref 0.450–4.500)

## 2020-05-11 NOTE — Progress Notes (Signed)
Labs reviewed, will discuss at next office visit.

## 2020-05-16 ENCOUNTER — Ambulatory Visit (INDEPENDENT_AMBULATORY_CARE_PROVIDER_SITE_OTHER): Payer: HMO | Admitting: Obstetrics & Gynecology

## 2020-05-16 ENCOUNTER — Other Ambulatory Visit: Payer: Self-pay

## 2020-05-16 ENCOUNTER — Other Ambulatory Visit: Payer: Self-pay | Admitting: Obstetrics & Gynecology

## 2020-05-16 ENCOUNTER — Encounter: Payer: Self-pay | Admitting: Obstetrics & Gynecology

## 2020-05-16 VITALS — BP 120/70 | HR 116 | Ht 63.0 in | Wt 197.0 lb

## 2020-05-16 DIAGNOSIS — N814 Uterovaginal prolapse, unspecified: Secondary | ICD-10-CM

## 2020-05-16 DIAGNOSIS — N8111 Cystocele, midline: Secondary | ICD-10-CM

## 2020-05-16 DIAGNOSIS — N816 Rectocele: Secondary | ICD-10-CM

## 2020-05-16 DIAGNOSIS — N393 Stress incontinence (female) (male): Secondary | ICD-10-CM

## 2020-05-16 NOTE — H&P (View-Only) (Signed)
PRE-OPERATIVE HISTORY AND PHYSICAL EXAM  HPI:  Cassie Ross is a 60 y.o. [phone] No LMP recorded. Patient is postmenopausal.; she is being admitted for surgery related to pelvic relaxation.  Patient is a [id number] (all NSVD) WF who complains symptoms related to uterine prolapse and cystocele and rectocele. Problem started several months if not yerar ago, but worsened significantly in Sept after an exacerbation of her COPD/ Asthma.  She feels pressure, and has urinary incontinence.  Denies freq or dysuria or urgency. Symptoms include: prolapse of tissue with straining, urinary incontinence: moderate and discomfort: moderate.  Longstanding h/o asthma, frequent hospitaliztions and also steroid use.  Also has diabetes.  Prior hernia surgeries due to her chronic cough and wheeze.  Not currently sexually active due to these sx's but wants to be.  PMHx: Past Medical History:  Diagnosis Date  . Asthma   . CHF (congestive heart failure) ([place])    1991- unknown after asthma attack  . Chicken pox   . COPD (chronic obstructive pulmonary disease) ([place])   . Diabetes ([place])   . DVT (deep venous thrombosis) ([place])   . GERD (gastroesophageal reflux disease)   . Hernia, hiatal   . HLD (hyperlipidemia)   . Hypertension   . Hypertension   . Seasonal allergies    Past Surgical History:  Procedure Laterality Date  . cataract surgery    . CESAREAN SECTION    . CHOLECYSTECTOMY    . COLONOSCOPY WITH PROPOFOL N/A 01/06/2018   Procedure: COLONOSCOPY WITH PROPOFOL;  Surgeon: Lollie Sails, MD;  Location: [facility] ENDOSCOPY;  Service: Endoscopy;  Laterality: N/A;  . ESOPHAGEAL DILATION    . ESOPHAGOGASTRODUODENOSCOPY (EGD) WITH PROPOFOL N/A 01/06/2018   Procedure: ESOPHAGOGASTRODUODENOSCOPY (EGD) WITH PROPOFOL;  Surgeon: Lollie Sails, MD;  Location: [facility] ENDOSCOPY;  Service: Endoscopy;  Laterality: N/A;  . HERNIA REPAIR    . hiatial hernia repair    . NASAL SINUS SURGERY     Family History  Problem  Relation Age of Onset  . Hypertension Mother   . Lung cancer Mother   . Colon cancer Father   . Colon polyps Sister   . Diabetes Sister    Social History   Tobacco Use  . Smoking status: Never Smoker  . Smokeless tobacco: Never Used  Substance Use Topics  . Alcohol use: Yes    Comment: glass of wine rarely  . Drug use: No    Current Outpatient Medications:  .  acetaminophen (TYLENOL) 500 MG tablet, Take 1,000 mg by mouth every 8 (eight) hours as needed for moderate pain. , Disp: , Rfl:  .  albuterol (PROAIR HFA) 108 (90 Base) MCG/ACT inhaler, Inhale 2 puffs into the lungs every 4 (four) hours as needed for wheezing or shortness of breath., Disp: 1 each, Rfl: 4 .  albuterol (PROVENTIL) (2.5 MG/3ML) 0.083% nebulizer solution, Take 3 mLs (2.5 mg total) by nebulization every 6 (six) hours as needed. USE 1 VIAL IN NEBULIZER EVERY 6 HOURS AS NEEDED (Patient taking differently: Take 2.5 mg by nebulization every 6 (six) hours as needed (wheezing/shortness of breath).), Disp: 300 mL, Rfl: 1 .  ALPRAZolam (XANAX) 0.5 MG tablet, Take 1 tablet (0.5 mg total) by mouth 2 (two) times daily as needed for anxiety. (Patient taking differently: Take 0.5 mg by mouth 2 (two) times daily.), Disp: 60 tablet, Rfl: 1 .  aspirin EC 81 MG tablet, Take 81 mg by mouth daily., Disp: , Rfl:  .  atorvastatin (LIPITOR) 10  MG tablet, Take 1 tablet (10 mg total) by mouth daily. (Patient taking differently: Take 10 mg by mouth every evening.), Disp: 90 tablet, Rfl: 2 .  Blood Glucose Monitoring Suppl (ONETOUCH VERIO) w/Device KIT, Use as directed diag, Disp: 1 kit, Rfl: 0 .  Budeson-Glycopyrrol-Formoterol (BREZTRI AEROSPHERE) 160-9-4.8 MCG/ACT AERO, Inhale 2 puffs into the lungs 2 (two) times daily. (Patient not taking: Reported on 05/04/2020), Disp: 11 g, Rfl: 0 .  budesonide-formoterol (SYMBICORT) 80-4.5 MCG/ACT inhaler, Inhale 2 puffs into the lungs 2 (two) times daily. (Patient taking differently: Inhale 1 puff into the  lungs 2 (two) times daily.), Disp: 1 each, Rfl: 3 .  carboxymethylcellul-glycerin (LUBRICANT DROPS/DUAL-ACTION) 0.5-0.9 % ophthalmic solution, Place 1-2 drops into both eyes 3 (three) times daily as needed for dry eyes., Disp: , Rfl:  .  diltiazem (CARDIZEM CD) 180 MG 24 hr capsule, Take 1 capsule (180 mg total) by mouth 2 (two) times daily., Disp: 180 capsule, Rfl: 1 .  doxycycline (VIBRA-TABS) 100 MG tablet, Take 1 tablet (100 mg total) by mouth 2 (two) times daily. (Patient not taking: Reported on 05/04/2020), Disp: 42 tablet, Rfl: 2 .  fluticasone (FLONASE) 50 MCG/ACT nasal spray, Place 1 spray into both nostrils in the morning and at bedtime., Disp: , Rfl:  .  furosemide (LASIX) 40 MG tablet, Take 40 mg by mouth 2 (two) times daily as needed (fluid retention.)., Disp: , Rfl:  .  glucose blood (ONETOUCH VERIO) test strip, Use 1 strip to check glucose 3 times a daily as directed, Disp: 100 each, Rfl: 12 .  insulin aspart (NOVOLOG FLEXPEN) 100 UNIT/ML FlexPen, Sliding scale - use as directed QAC per sliding scale instructions. Max daily dose is 25 units in 24 hours. E11.65 (Patient taking differently: Inject 2-10 Units into the skin as directed. Sliding scale - use as directed QAC per sliding scale instructions. Max daily dose is 25 units in 24 hours. E11.65), Disp: 15 mL, Rfl: 11 .  insulin glargine (LANTUS SOLOSTAR) 100 UNIT/ML Solostar Pen, Inject 10 Units into the skin daily. (Patient taking differently: Inject 10 Units into the skin at bedtime.), Disp: 15 mL, Rfl: 3 .  ipratropium-albuterol (DUONEB) 0.5-2.5 (3) MG/3ML SOLN, USE 1 AMPULE IN NEBULIZER EVERY 4 HOURS AS NEEDED (Patient taking differently: Inhale 3 mLs into the lungs every 4 (four) hours as needed (wheezing/shortness of breath).), Disp: 360 mL, Rfl: 0 .  loratadine (CLARITIN) 10 MG tablet, Take 10 mg by mouth daily., Disp: , Rfl:  .  montelukast (SINGULAIR) 10 MG tablet, Take 1 tablet (10 mg total) by mouth at bedtime., Disp: 90 tablet,  Rfl: 1 .  Multiple Vitamin (MULTIVITAMIN WITH MINERALS) TABS tablet, Take 1 tablet by mouth daily., Disp: , Rfl:  .  pantoprazole (PROTONIX) 40 MG tablet, Take 40 mg by mouth 2 (two) times daily. , Disp: , Rfl:  .  potassium chloride (KLOR-CON) 10 MEQ tablet, Take 1 tablet (10 mEq total) by mouth 2 (two) times daily., Disp: 180 tablet, Rfl: 5 .  predniSONE (DELTASONE) 20 MG tablet, TAKE AS DIRECTED, Disp: 270 tablet, Rfl: 0 .  theophylline (THEODUR) 300 MG 12 hr tablet, Take 1 tablet (300 mg total) by mouth 2 (two) times daily., Disp: 180 tablet, Rfl: 1 .  tiotropium (SPIRIVA HANDIHALER) 18 MCG inhalation capsule, Place 1 capsule (18 mcg total) into inhaler and inhale daily., Disp: 30 capsule, Rfl: 12 Allergies: Shellfish allergy, Sunflower oil, Levaquin [levofloxacin], and Penicillins  Review of Systems  Constitutional: Negative for chills, fever and  malaise/fatigue.  HENT: Negative for congestion, sinus pain and sore throat.   Eyes: Negative for blurred vision and pain.  Respiratory: Negative for cough and wheezing.   Cardiovascular: Negative for chest pain and leg swelling.  Gastrointestinal: Negative for abdominal pain, constipation, diarrhea, heartburn, nausea and vomiting.  Genitourinary: Negative for dysuria, frequency, hematuria and urgency.  Musculoskeletal: Negative for back pain, joint pain, myalgias and neck pain.  Skin: Negative for itching and rash.  Neurological: Negative for dizziness, tremors and weakness.  Endo/Heme/Allergies: Does not bruise/bleed easily.  Psychiatric/Behavioral: Negative for depression. The patient is not nervous/anxious and does not have insomnia.     Objective: There were no vitals taken for this visit. There were no vitals filed for this visit. Physical Exam Constitutional:      General: She is not in acute distress.    Appearance: She is well-developed.  HENT:     Head: Normocephalic and atraumatic. No laceration.     Right Ear: Hearing normal.      Left Ear: Hearing normal.     Mouth/Throat:     Pharynx: Uvula midline.  Eyes:     Pupils: Pupils are equal, round, and reactive to light.  Neck:     Thyroid: No thyromegaly.  Cardiovascular:     Rate and Rhythm: Normal rate and regular rhythm.     Heart sounds: No murmur heard. No friction rub. No gallop.   Pulmonary:     Effort: Pulmonary effort is normal. No respiratory distress.     Breath sounds: Normal breath sounds. No wheezing.  Abdominal:     General: Bowel sounds are normal. There is no distension.     Palpations: Abdomen is soft.     Tenderness: There is no abdominal tenderness. There is no rebound.  Musculoskeletal:        General: Normal range of motion.     Cervical back: Normal range of motion and neck supple.  Neurological:     Mental Status: She is alert and oriented to person, place, and time.     Cranial Nerves: No cranial nerve deficit.  Skin:    General: Skin is warm and dry.  Psychiatric:        Judgment: Judgment normal.  Vitals reviewed.     Assessment: 1. Cystocele, midline   2. Uterine prolapse   3. Rectocele   4. Stress incontinence   Plan TVH BSO AP Repair and Pubovaginal Sling  I have had a careful discussion with this patient about all the options available and the risk/benefits of each. I have fully informed this patient that surgery may subject her to a variety of discomforts and risks: She understands that most patients have surgery with little difficulty, but problems can happen ranging from minor to fatal. These include nausea, vomiting, pain, bleeding, infection, poor healing, hernia, or formation of adhesions. Unexpected reactions may occur from any drug or anesthetic given. Unintended injury may occur to other pelvic or abdominal structures such as Fallopian tubes, ovaries, bladder, ureter (tube from kidney to bladder), or bowel. Nerves going from the pelvis to the legs may be injured. Any such injury may require immediate or later  additional surgery to correct the problem. Excessive blood loss requiring transfusion is very unlikely but possible. Dangerous blood clots may form in the legs or lungs. Physical and sexual activity will be restricted in varying degrees for an indeterminate period of time but most often 2-6 weeks.  Finally, she understands that it is impossible to list  every possible undesirable effect and that the condition for which surgery is done is not always cured or significantly improved, and in rare cases may be even worse.Ample time was given to answer all questions.  Barnett Applebaum, MD, [facility], [facility] 05/16/2020  1:58 PM

## 2020-05-16 NOTE — Progress Notes (Signed)
PRE-OPERATIVE HISTORY AND PHYSICAL EXAM  HPI:  Cassie Ross is a 60 y.o. [phone] No LMP recorded. Patient is postmenopausal.; she is being admitted for surgery related to pelvic relaxation.  Patient is a [id number] (all NSVD) WF who complains symptoms related to uterine prolapse and cystocele and rectocele. Problem started several months if not yerar ago, but worsened significantly in Sept after an exacerbation of her COPD/ Asthma.  She feels pressure, and has urinary incontinence.  Denies freq or dysuria or urgency. Symptoms include: prolapse of tissue with straining, urinary incontinence: moderate and discomfort: moderate.  Longstanding h/o asthma, frequent hospitaliztions and also steroid use.  Also has diabetes.  Prior hernia surgeries due to her chronic cough and wheeze.  Not currently sexually active due to these sx's but wants to be.  PMHx: Past Medical History:  Diagnosis Date  . Asthma   . CHF (congestive heart failure) ([place])    1991- unknown after asthma attack  . Chicken pox   . COPD (chronic obstructive pulmonary disease) ([place])   . Diabetes ([place])   . DVT (deep venous thrombosis) ([place])   . GERD (gastroesophageal reflux disease)   . Hernia, hiatal   . HLD (hyperlipidemia)   . Hypertension   . Hypertension   . Seasonal allergies    Past Surgical History:  Procedure Laterality Date  . cataract surgery    . CESAREAN SECTION    . CHOLECYSTECTOMY    . COLONOSCOPY WITH PROPOFOL N/A 01/06/2018   Procedure: COLONOSCOPY WITH PROPOFOL;  Surgeon: Lollie Sails, MD;  Location: [facility] ENDOSCOPY;  Service: Endoscopy;  Laterality: N/A;  . ESOPHAGEAL DILATION    . ESOPHAGOGASTRODUODENOSCOPY (EGD) WITH PROPOFOL N/A 01/06/2018   Procedure: ESOPHAGOGASTRODUODENOSCOPY (EGD) WITH PROPOFOL;  Surgeon: Lollie Sails, MD;  Location: [facility] ENDOSCOPY;  Service: Endoscopy;  Laterality: N/A;  . HERNIA REPAIR    . hiatial hernia repair    . NASAL SINUS SURGERY     Family History  Problem  Relation Age of Onset  . Hypertension Mother   . Lung cancer Mother   . Colon cancer Father   . Colon polyps Sister   . Diabetes Sister    Social History   Tobacco Use  . Smoking status: Never Smoker  . Smokeless tobacco: Never Used  Substance Use Topics  . Alcohol use: Yes    Comment: glass of wine rarely  . Drug use: No    Current Outpatient Medications:  .  acetaminophen (TYLENOL) 500 MG tablet, Take 1,000 mg by mouth every 8 (eight) hours as needed for moderate pain. , Disp: , Rfl:  .  albuterol (PROAIR HFA) 108 (90 Base) MCG/ACT inhaler, Inhale 2 puffs into the lungs every 4 (four) hours as needed for wheezing or shortness of breath., Disp: 1 each, Rfl: 4 .  albuterol (PROVENTIL) (2.5 MG/3ML) 0.083% nebulizer solution, Take 3 mLs (2.5 mg total) by nebulization every 6 (six) hours as needed. USE 1 VIAL IN NEBULIZER EVERY 6 HOURS AS NEEDED (Patient taking differently: Take 2.5 mg by nebulization every 6 (six) hours as needed (wheezing/shortness of breath).), Disp: 300 mL, Rfl: 1 .  ALPRAZolam (XANAX) 0.5 MG tablet, Take 1 tablet (0.5 mg total) by mouth 2 (two) times daily as needed for anxiety. (Patient taking differently: Take 0.5 mg by mouth 2 (two) times daily.), Disp: 60 tablet, Rfl: 1 .  aspirin EC 81 MG tablet, Take 81 mg by mouth daily., Disp: , Rfl:  .  atorvastatin (LIPITOR) 10  MG tablet, Take 1 tablet (10 mg total) by mouth daily. (Patient taking differently: Take 10 mg by mouth every evening.), Disp: 90 tablet, Rfl: 2 .  Blood Glucose Monitoring Suppl (ONETOUCH VERIO) w/Device KIT, Use as directed diag, Disp: 1 kit, Rfl: 0 .  Budeson-Glycopyrrol-Formoterol (BREZTRI AEROSPHERE) 160-9-4.8 MCG/ACT AERO, Inhale 2 puffs into the lungs 2 (two) times daily. (Patient not taking: Reported on 05/04/2020), Disp: 11 g, Rfl: 0 .  budesonide-formoterol (SYMBICORT) 80-4.5 MCG/ACT inhaler, Inhale 2 puffs into the lungs 2 (two) times daily. (Patient taking differently: Inhale 1 puff into the  lungs 2 (two) times daily.), Disp: 1 each, Rfl: 3 .  carboxymethylcellul-glycerin (LUBRICANT DROPS/DUAL-ACTION) 0.5-0.9 % ophthalmic solution, Place 1-2 drops into both eyes 3 (three) times daily as needed for dry eyes., Disp: , Rfl:  .  diltiazem (CARDIZEM CD) 180 MG 24 hr capsule, Take 1 capsule (180 mg total) by mouth 2 (two) times daily., Disp: 180 capsule, Rfl: 1 .  doxycycline (VIBRA-TABS) 100 MG tablet, Take 1 tablet (100 mg total) by mouth 2 (two) times daily. (Patient not taking: Reported on 05/04/2020), Disp: 42 tablet, Rfl: 2 .  fluticasone (FLONASE) 50 MCG/ACT nasal spray, Place 1 spray into both nostrils in the morning and at bedtime., Disp: , Rfl:  .  furosemide (LASIX) 40 MG tablet, Take 40 mg by mouth 2 (two) times daily as needed (fluid retention.)., Disp: , Rfl:  .  glucose blood (ONETOUCH VERIO) test strip, Use 1 strip to check glucose 3 times a daily as directed, Disp: 100 each, Rfl: 12 .  insulin aspart (NOVOLOG FLEXPEN) 100 UNIT/ML FlexPen, Sliding scale - use as directed QAC per sliding scale instructions. Max daily dose is 25 units in 24 hours. E11.65 (Patient taking differently: Inject 2-10 Units into the skin as directed. Sliding scale - use as directed QAC per sliding scale instructions. Max daily dose is 25 units in 24 hours. E11.65), Disp: 15 mL, Rfl: 11 .  insulin glargine (LANTUS SOLOSTAR) 100 UNIT/ML Solostar Pen, Inject 10 Units into the skin daily. (Patient taking differently: Inject 10 Units into the skin at bedtime.), Disp: 15 mL, Rfl: 3 .  ipratropium-albuterol (DUONEB) 0.5-2.5 (3) MG/3ML SOLN, USE 1 AMPULE IN NEBULIZER EVERY 4 HOURS AS NEEDED (Patient taking differently: Inhale 3 mLs into the lungs every 4 (four) hours as needed (wheezing/shortness of breath).), Disp: 360 mL, Rfl: 0 .  loratadine (CLARITIN) 10 MG tablet, Take 10 mg by mouth daily., Disp: , Rfl:  .  montelukast (SINGULAIR) 10 MG tablet, Take 1 tablet (10 mg total) by mouth at bedtime., Disp: 90 tablet,  Rfl: 1 .  Multiple Vitamin (MULTIVITAMIN WITH MINERALS) TABS tablet, Take 1 tablet by mouth daily., Disp: , Rfl:  .  pantoprazole (PROTONIX) 40 MG tablet, Take 40 mg by mouth 2 (two) times daily. , Disp: , Rfl:  .  potassium chloride (KLOR-CON) 10 MEQ tablet, Take 1 tablet (10 mEq total) by mouth 2 (two) times daily., Disp: 180 tablet, Rfl: 5 .  predniSONE (DELTASONE) 20 MG tablet, TAKE AS DIRECTED, Disp: 270 tablet, Rfl: 0 .  theophylline (THEODUR) 300 MG 12 hr tablet, Take 1 tablet (300 mg total) by mouth 2 (two) times daily., Disp: 180 tablet, Rfl: 1 .  tiotropium (SPIRIVA HANDIHALER) 18 MCG inhalation capsule, Place 1 capsule (18 mcg total) into inhaler and inhale daily., Disp: 30 capsule, Rfl: 12 Allergies: Shellfish allergy, Sunflower oil, Levaquin [levofloxacin], and Penicillins  Review of Systems  Constitutional: Negative for chills, fever and  malaise/fatigue.  HENT: Negative for congestion, sinus pain and sore throat.   Eyes: Negative for blurred vision and pain.  Respiratory: Negative for cough and wheezing.   Cardiovascular: Negative for chest pain and leg swelling.  Gastrointestinal: Negative for abdominal pain, constipation, diarrhea, heartburn, nausea and vomiting.  Genitourinary: Negative for dysuria, frequency, hematuria and urgency.  Musculoskeletal: Negative for back pain, joint pain, myalgias and neck pain.  Skin: Negative for itching and rash.  Neurological: Negative for dizziness, tremors and weakness.  Endo/Heme/Allergies: Does not bruise/bleed easily.  Psychiatric/Behavioral: Negative for depression. The patient is not nervous/anxious and does not have insomnia.     Objective: There were no vitals taken for this visit. There were no vitals filed for this visit. Physical Exam Constitutional:      General: She is not in acute distress.    Appearance: She is well-developed.  HENT:     Head: Normocephalic and atraumatic. No laceration.     Right Ear: Hearing normal.      Left Ear: Hearing normal.     Mouth/Throat:     Pharynx: Uvula midline.  Eyes:     Pupils: Pupils are equal, round, and reactive to light.  Neck:     Thyroid: No thyromegaly.  Cardiovascular:     Rate and Rhythm: Normal rate and regular rhythm.     Heart sounds: No murmur heard. No friction rub. No gallop.   Pulmonary:     Effort: Pulmonary effort is normal. No respiratory distress.     Breath sounds: Normal breath sounds. No wheezing.  Abdominal:     General: Bowel sounds are normal. There is no distension.     Palpations: Abdomen is soft.     Tenderness: There is no abdominal tenderness. There is no rebound.  Musculoskeletal:        General: Normal range of motion.     Cervical back: Normal range of motion and neck supple.  Neurological:     Mental Status: She is alert and oriented to person, place, and time.     Cranial Nerves: No cranial nerve deficit.  Skin:    General: Skin is warm and dry.  Psychiatric:        Judgment: Judgment normal.  Vitals reviewed.     Assessment: 1. Cystocele, midline   2. Uterine prolapse   3. Rectocele   4. Stress incontinence   Plan TVH BSO AP Repair and Pubovaginal Sling  I have had a careful discussion with this patient about all the options available and the risk/benefits of each. I have fully informed this patient that surgery may subject her to a variety of discomforts and risks: She understands that most patients have surgery with little difficulty, but problems can happen ranging from minor to fatal. These include nausea, vomiting, pain, bleeding, infection, poor healing, hernia, or formation of adhesions. Unexpected reactions may occur from any drug or anesthetic given. Unintended injury may occur to other pelvic or abdominal structures such as Fallopian tubes, ovaries, bladder, ureter (tube from kidney to bladder), or bowel. Nerves going from the pelvis to the legs may be injured. Any such injury may require immediate or later  additional surgery to correct the problem. Excessive blood loss requiring transfusion is very unlikely but possible. Dangerous blood clots may form in the legs or lungs. Physical and sexual activity will be restricted in varying degrees for an indeterminate period of time but most often 2-6 weeks.  Finally, she understands that it is impossible to list  every possible undesirable effect and that the condition for which surgery is done is not always cured or significantly improved, and in rare cases may be even worse.Ample time was given to answer all questions.  Barnett Applebaum, MD, [facility], [facility] 05/16/2020  1:58 PM

## 2020-05-16 NOTE — Patient Instructions (Signed)
Anterior and Posterior Colporrhaphy and Sling Procedure, Care After The following information offers guidance on how to care for yourself after your procedure. Your health care provider may also give you more specific instructions. If you have problems or questions, contact your health care provider. What can I expect after the procedure? After the procedure, it is common to have:  Pain in the surgical area.  Vaginal spotting and discharge. You will need to use a sanitary pad during this time.  Tiredness (fatigue). Follow these instructions at home: Medicines  Take over-the-counter and prescription medicines only as told by your health care provider.  If you were prescribed an antibiotic medicine, take it as told by your health care provider. Do not stop using the antibiotic even if you start to feel better.  Ask your health care provider if the medicine prescribed to you: ? Requires you to avoid driving or using machinery. ? Can cause constipation. You may need to take these actions to prevent or treat constipation:  Drink enough fluid to keep your urine pale yellow.  Take over-the-counter or prescription medicines.  Eat foods that are high in fiber, such as beans, whole grains, and fresh fruits and vegetables.  Limit foods that are high in fat and processed sugars, such as fried or sweet foods. Incision care  Check your incision area every day for signs of infection. Check for: ? More fluid or blood coming from your vagina. ? Pus or a bad-smelling discharge from your vagina. ? More pain or swelling in your vaginal area.  Do not take baths, swim, or use a hot tub until your health care provider approves. You may take showers.  Keep the area between your vagina and rectum (perineal area) clean and dry. Make sure you clean the area after every bowel movement and each time you urinate.  Ask your health care provider if you can take a sitz bath or sit in a tub of clean, warm  water. Activity  Rest as told by your health care provider.  Avoid sitting for a long time without moving. Get up to take short walks every 1-2 hours. This is important to improve blood flow and breathing. Ask for help if you feel weak or unsteady.  Avoid activities that take a lot of effort (are strenuous).  Do not lift anything that is heavier than 5 lb (2.3 kg), or the limit that you are told, until your health care provider says that it is safe.  Avoid pushing or pulling motions.  Return to your normal activities as told by your health care provider. Ask your health care provider what activities are safe for you.   General instructions  You may be instructed to do pelvic floor exercises (Kegel exercises). Do them as told by your health care provider.  Wear compression stockings as told by your health care provider. These stockings help to prevent blood clots and reduce swelling in your legs.  If you have a urinary catheter in place, follow instructions from your health care provider about how to empty the catheter bag.  Do not douche, use tampons, or have sex until your health care provider says it is okay.  Keep all follow-up visits. This is important. Contact a health care provider if:  Medicine does not help your pain.  You have new bruising in the vaginal area.  You have frequent or urgent urination, or you are unable to completely empty your bladder.  You feel a burning sensation when urinating.  You have more fluid or blood coming from your vaginal area.  You have pus or a bad-smelling discharge coming from the vaginal area.  You have more pain or swelling in your vaginal area. Get help right away if:  You have a fever or chills.  You have heavy vaginal bleeding.  You cannot urinate or your catheter stops draining.  You have chest, abdominal, or leg pain.  You have trouble breathing. These symptoms may represent a serious problem that is an emergency. Do  not wait to see if the symptoms will go away. Get medical help right away. Call your local emergency services (911 in the [place] Do not drive yourself to the hospital. Summary  After the procedure, it is common to have pain, fatigue, and vaginal spotting and discharge.  Keep the area between your vagina and rectum (perineal area) clean and dry. Make sure you clean the area after each bowel movement and each time you urinate.  Follow instructions from your health care provider on any activity restrictions after the procedure. This information is not intended to replace advice given to you by your health care provider. Make sure you discuss any questions you have with your health care provider. Document Revised: 08/24/2019 Document Reviewed: 08/24/2019 Elsevier Patient Education  [place].   Vaginal Hysterectomy, Care After The following information offers guidance on how to care for yourself after your procedure. Your health care provider may also give you more specific instructions. If you have problems or questions, contact your health care provider. What can I expect after the procedure? After the procedure, it is common to have:  Pain in the lower abdomen and vagina.  Vaginal bleeding and discharge for up to 1 week. You will need to use a sanitary pad after this procedure.  Difficulty having a bowel movement (constipation).  Temporary problems emptying the bladder.  Tiredness (fatigue).  Poor appetite.  Less interest in sex.  Feelings of sadness or other emotions. If your ovaries were also removed, it is also common to have symptoms of menopause, such as hot flashes, night sweats, and lack of sleep (insomnia). Follow these instructions at home: Medicines  Take over-the-counter and prescription medicines only as told by your health care provider.  Do not take aspirin or NSAIDs, such as ibuprofen. These medicines can cause bleeding.  Ask your health care provider  if the medicine prescribed to you: ? Requires you to avoid driving or using machinery. ? Can cause constipation. You may need to take these actions to prevent or treat constipation:  Drink enough fluid to keep your urine pale yellow.  Take over-the-counter or prescription medicines.  Eat foods that are high in fiber, such as beans, whole grains, and fresh fruits and vegetables.  Limit foods that are high in fat and processed sugars, such as fried or sweet foods.   Activity  Rest as told by your health care provider.  Return to your normal activities as told by your health care provider. Ask your health care provider what activities are safe for you  Avoid sitting for a long time without moving. Get up to take short walks every 1-2 hours. This is important to improve blood flow and breathing. Ask for help if you feel weak or unsteady.  Try to have someone home with you for 1-2 weeks to help you with everyday chores.  Do not lift anything that is heavier than 10 lb (4.5 kg), or the limit that you are told, until your  health care provider says that it is safe.  If you were given a sedative during the procedure, it can affect you for several hours. Do not drive or operate machinery until your health care provider says that it is safe.   Lifestyle  Do not use any products that contain nicotine or tobacco. These products include cigarettes, chewing tobacco, and vaping devices, such as e-cigarettes. These can delay healing after surgery. If you need help quitting, ask your health care provider.  Do not drink alcohol until your health care provider approves. General instructions  Do not douche, use tampons, or have sex for at least 6 weeks, or as told by your health care provider.  If you struggle with physical or emotional changes after your procedure, speak with your health care provider or a therapist.  The stitches inside your vagina will dissolve over time and do not need to be taken  out.  Do not take baths, swim, or use a hot tub until your health care provider approves. You may only be allowed to take showers for 2-3 weeks  Wear compression stockings as told by your health care provider. These stockings help to prevent blood clots and reduce swelling in your legs.  Keep all follow-up visits. This is important. Contact a health care provider if:  Your pain medicine is not helping.  You have a fever.  You have nausea or vomiting that does not go away.  You feel dizzy.  You have blood, pus, or a bad-smelling discharge from your vagina more than 1 week after the procedure.  You continue to have trouble urinating 3-5 days after the procedure. Get help right away if:  You have severe pain in your abdomen or back.  You faint.  You have heavy vaginal bleeding and blood clots, soaking through a sanitary pad in less than 1 hour.  You have chest pain or shortness of breath.  You have pain, swelling, or redness in your leg. These symptoms may represent a serious problem that is an emergency. Do not wait to see if the symptoms will go away. Get medical help right away. Call your local emergency services (911 in the [place] Do not drive yourself to the hospital. Summary  After the procedure, it is common to have pain, vaginal bleeding, constipation, temporary problems emptying your bladder, and feelings of sadness or other emotions.  Take over-the-counter and prescription medicines only as told by your health care provider.  Rest as told by your health care provider. Return to your normal activities as told by your health care provider.  Contact a health care provider if your pain medicine is not helping, or you have a fever, dizziness, or trouble urinating several days after the procedure.  Get help right away if you have severe pain in your abdomen or back, or if you faint, have heavy bleeding, or have chest pain or shortness of breath. This information is not  intended to replace advice given to you by your health care provider. Make sure you discuss any questions you have with your health care provider. Document Revised: 10/29/2019 Document Reviewed: 10/29/2019 Elsevier Patient Education  [place].

## 2020-05-17 ENCOUNTER — Other Ambulatory Visit
Admission: RE | Admit: 2020-05-17 | Discharge: 2020-05-17 | Disposition: A | Discharge status: Home / Self Care | Payer: HMO | Source: Ambulatory Visit | Attending: Obstetrics & Gynecology | Admitting: Obstetrics & Gynecology

## 2020-05-17 HISTORY — DX: Anemia, unspecified: D64.9

## 2020-05-17 HISTORY — DX: Failed or difficult intubation, initial encounter: T88.4XXA

## 2020-05-17 HISTORY — DX: Pneumonia, unspecified organism: J18.9

## 2020-05-17 NOTE — Patient Instructions (Signed)
Your procedure is scheduled on: Tuesday May 23, 2020. Report to Day Surgery inside [facility] 2nd floor stop by Admissions desk first before getting on Elevator). To find out your arrival time please call [phone] between 1PM - 3PM on Monday May 22, 2020.  Remember: Instructions that are not followed completely may result in serious medical risk,  up to and including death, or upon the discretion of your surgeon and anesthesiologist your  surgery may need to be rescheduled.     _X__ 1. Do not eat food or drink fluids after midnight the night before your procedure.                 No chewing gum or hard candies.   __X__2.  On the morning of surgery brush your teeth with toothpaste and water, you                may rinse your mouth with mouthwash if you wish.  Do not swallow any toothpaste of mouthwash.     _X__ 3.  No Alcohol for 24 hours before or after surgery.   _X__ 4.  Do Not Smoke or use e-cigarettes For 24 Hours Prior to Your Surgery.                 Do not use any chewable tobacco products for at least 6 hours prior to                 Surgery.  _X__  5.  Do not use any recreational drugs (marijuana, cocaine, heroin, ecstasy, MDMA or other)                For at least one week prior to your surgery.  Combination of these drugs with anesthesia                May have life threatening results.  __X__ 6.  Notify your doctor if there is any change in your medical condition      (cold, fever, infections).     Do not wear jewelry, make-up, hairpins, clips or nail polish. Do not wear lotions, powders, or perfumes. You may wear deodorant. Do not shave 48 hours prior to surgery. Men may shave face and neck. Do not bring valuables to the hospital.    [facility] is not responsible for any belongings or valuables.  Contacts, dentures or bridgework may not be worn into surgery. Leave your suitcase in the car. After surgery it may be brought to your  room. For patients admitted to the hospital, discharge time is determined by your treatment team.   Patients discharged the day of surgery will not be allowed to drive home.   Make arrangements for someone to be with you for the first 24 hours of your Same Day Discharge.   __x__ Take these medicines the morning of surgery with A SIP OF WATER:    1. diltiazem (CARDIZEM CD) 180 MG   2. theophylline  3. loratadine (CLARITIN) 10 MG  4. pantoprazole (PROTONIX) 40 MG  5.  6.  ____ Fleet Enema (as directed)   ____ Use CHG Soap (or wipes) as directed  ____ Use Benzoyl Peroxide Gel as instructed  __X__ Use inhalers on the day of surgery  albuterol (PROAIR HFA) 108 (90 Base) MCG/ACT inhaler  tiotropium (SPIRIVA HANDIHALER) 18 MCG inhalation capsule  ____ Stop metformin 2 days prior to surgery    __X__ Take 1/2 of usualtiotropium (SPIRIVA HANDIHALER) 18 MCG inhalation capsule  insulin dose the night before surgery. No insulin the morning          of surgery.    __X__ Stop Anti-inflammatories such as Ibuprofen, Aleve, Advil, naproxen, aspirin and or BC powders. 1 week before your procedure   __X__ Stop supplements until after surgery.    __X__ Do not start any herbal supplements before your procedure.     If you have any questions regarding your pre-procedure instructions,  Please call Pre-admit Testing at [phone].

## 2020-05-18 ENCOUNTER — Encounter
Admission: RE | Admit: 2020-05-18 | Discharge: 2020-05-18 | Disposition: A | Discharge status: Home / Self Care | Payer: HMO | Source: Ambulatory Visit | Attending: Obstetrics & Gynecology | Admitting: Obstetrics & Gynecology

## 2020-05-18 ENCOUNTER — Other Ambulatory Visit: Payer: Self-pay

## 2020-05-18 DIAGNOSIS — Z01812 Encounter for preprocedural laboratory examination: Secondary | ICD-10-CM | POA: Insufficient documentation

## 2020-05-18 LAB — CBC
HCT: 42.4 % (ref 36.0–46.0)
Hemoglobin: 14.1 g/dL (ref 12.0–15.0)
MCH: 30.2 pg (ref 26.0–34.0)
MCHC: 33.3 g/dL (ref 30.0–36.0)
MCV: 90.8 fL (ref 80.0–100.0)
Platelets: 265 10*3/uL (ref 150–400)
RBC: 4.67 MIL/uL (ref 3.87–5.11)
RDW: 13.2 % (ref 11.5–15.5)
WBC: 9.9 10*3/uL (ref 4.0–10.5)
nRBC: 0 % (ref 0.0–0.2)

## 2020-05-18 LAB — PROTIME-INR
INR: 1 (ref 0.8–1.2)
Prothrombin Time: 12.3 seconds (ref 11.4–15.2)

## 2020-05-18 LAB — TYPE AND SCREEN
ABO/RH(D): O POS
Antibody Screen: NEGATIVE

## 2020-05-18 LAB — APTT: aPTT: 28 seconds (ref 24–36)

## 2020-05-22 ENCOUNTER — Other Ambulatory Visit: Payer: Self-pay

## 2020-05-22 ENCOUNTER — Encounter: Payer: Self-pay | Admitting: Hospice and Palliative Medicine

## 2020-05-22 ENCOUNTER — Other Ambulatory Visit
Admission: RE | Admit: 2020-05-22 | Discharge: 2020-05-22 | Disposition: A | Discharge status: Home / Self Care | Payer: HMO | Source: Ambulatory Visit | Attending: Obstetrics & Gynecology | Admitting: Obstetrics & Gynecology

## 2020-05-22 ENCOUNTER — Ambulatory Visit (INDEPENDENT_AMBULATORY_CARE_PROVIDER_SITE_OTHER): Payer: HMO | Admitting: Hospice and Palliative Medicine

## 2020-05-22 VITALS — BP 128/70 | HR 100 | Temp 97.7°F | Resp 16 | Ht 63.0 in | Wt 195.2 lb

## 2020-05-22 DIAGNOSIS — I10 Essential (primary) hypertension: Secondary | ICD-10-CM | POA: Diagnosis not present

## 2020-05-22 DIAGNOSIS — Z794 Long term (current) use of insulin: Secondary | ICD-10-CM

## 2020-05-22 DIAGNOSIS — J449 Chronic obstructive pulmonary disease, unspecified: Secondary | ICD-10-CM

## 2020-05-22 DIAGNOSIS — E1165 Type 2 diabetes mellitus with hyperglycemia: Secondary | ICD-10-CM

## 2020-05-22 DIAGNOSIS — R0602 Shortness of breath: Secondary | ICD-10-CM | POA: Diagnosis not present

## 2020-05-22 DIAGNOSIS — Z01812 Encounter for preprocedural laboratory examination: Secondary | ICD-10-CM | POA: Insufficient documentation

## 2020-05-22 DIAGNOSIS — Z20822 Contact with and (suspected) exposure to covid-19: Secondary | ICD-10-CM | POA: Insufficient documentation

## 2020-05-22 LAB — SARS CORONAVIRUS 2 (TAT 6-24 HRS): SARS Coronavirus 2: NEGATIVE

## 2020-05-22 LAB — POCT GLYCOSYLATED HEMOGLOBIN (HGB A1C): Hemoglobin A1C: 7.5 % — AB (ref 4.0–5.6)

## 2020-05-22 MED ORDER — SODIUM CHLORIDE 0.9 % IV SOLN: INTRAVENOUS | Status: DC

## 2020-05-22 MED ORDER — POVIDONE-IODINE 10 % EX SWAB: 2.0000 "application " | Freq: Once | CUTANEOUS | Status: DC

## 2020-05-22 MED ORDER — LACTATED RINGERS IV SOLN: INTRAVENOUS | Status: DC

## 2020-05-22 MED ORDER — ORAL CARE MOUTH RINSE: 15.0000 mL | Freq: Once | OROMUCOSAL | Status: AC

## 2020-05-22 MED ORDER — CHLORHEXIDINE GLUCONATE 0.12 % MT SOLN: 15.0000 mL | Freq: Once | OROMUCOSAL | Status: AC

## 2020-05-22 MED ORDER — IPRATROPIUM-ALBUTEROL 0.5-2.5 (3) MG/3ML IN SOLN
3.0000 mL | Freq: Once | RESPIRATORY_TRACT | Status: AC
  Administered 2020-05-22: 3 mL via RESPIRATORY_TRACT

## 2020-05-22 MED ORDER — GENTAMICIN SULFATE 40 MG/ML IJ SOLN
5.0000 mg/kg | INTRAVENOUS | Status: AC
  Administered 2020-05-23: 448 mg via INTRAVENOUS
  Filled 2020-05-22: qty 11.25

## 2020-05-22 MED ORDER — METRONIDAZOLE IN NACL 5-0.79 MG/ML-% IV SOLN
500.0000 mg | INTRAVENOUS | Status: AC
  Administered 2020-05-23: 500 mg via INTRAVENOUS
  Filled 2020-05-22: qty 100

## 2020-05-22 NOTE — Progress Notes (Signed)
[facility] [address], [place] [address]  Internal MEDICINE  Office Visit Note  Patient Name: Cassie Ross  [id number]  [id number]  Date of Service: 05/23/2020  Chief Complaint  Patient presents with  . Follow-up  . Hyperlipidemia  . Hypertension  . Gastroesophageal Reflux  . Diabetes  . Anemia  . Asthma  . COPD    HPI Patient is here for routine follow-up Scheduled for GYN/urolopgy surgery in the morning Has underwent extensive work up for surgical clearance Complaining of wheezing and tightness today as she has only been able to have 1 nebulizer treatment at home due to being in town most of the day--typically takes 3-4 treatments per day DM-glucose levels have been difficult to control since being treated recently with prednisone Currently taking 10 units Lantus each night and utilizing sliding scale insulin as directed   Current Medication: Outpatient Encounter Medications as of 05/22/2020  Medication Sig Note  . acetaminophen (TYLENOL) 500 MG tablet Take 1,000 mg by mouth every 8 (eight) hours as needed for moderate pain.    Marland Kitchen albuterol (PROAIR HFA) 108 (90 Base) MCG/ACT inhaler Inhale 2 puffs into the lungs every 4 (four) hours as needed for wheezing or shortness of breath.   Marland Kitchen albuterol (PROVENTIL) (2.5 MG/3ML) 0.083% nebulizer solution Take 3 mLs (2.5 mg total) by nebulization every 6 (six) hours as needed. USE 1 VIAL IN NEBULIZER EVERY 6 HOURS AS NEEDED (Patient taking differently: Take 2.5 mg by nebulization every 6 (six) hours as needed (wheezing/shortness of breath).)   . ALPRAZolam (XANAX) 0.5 MG tablet Take 1 tablet (0.5 mg total) by mouth 2 (two) times daily as needed for anxiety. (Patient taking differently: Take 0.5 mg by mouth 2 (two) times daily.)   . aspirin EC 81 MG tablet Take 81 mg by mouth daily. (Patient not taking: No sig reported)   . Blood Glucose Monitoring Suppl (ONETOUCH VERIO) w/Device KIT Use as directed diag   .  carboxymethylcellul-glycerin (LUBRICANT DROPS/DUAL-ACTION) 0.5-0.9 % ophthalmic solution Place 1-2 drops into both eyes 3 (three) times daily as needed for dry eyes.   Marland Kitchen diltiazem (CARDIZEM CD) 180 MG 24 hr capsule Take 1 capsule (180 mg total) by mouth 2 (two) times daily.   . fluticasone (FLONASE) 50 MCG/ACT nasal spray Place 1 spray into both nostrils in the morning and at bedtime.   . furosemide (LASIX) 40 MG tablet Take 40 mg by mouth 2 (two) times daily as needed (fluid retention.).   Marland Kitchen glucose blood (ONETOUCH VERIO) test strip Use 1 strip to check glucose 3 times a daily as directed   . insulin aspart (NOVOLOG FLEXPEN) 100 UNIT/ML FlexPen Sliding scale - use as directed QAC per sliding scale instructions. Max daily dose is 25 units in 24 hours. E11.65 (Patient taking differently: Inject 2-10 Units into the skin as directed. Sliding scale - use as directed QAC per sliding scale instructions. Max daily dose is 25 units in 24 hours. E11.65)   . insulin glargine (LANTUS SOLOSTAR) 100 UNIT/ML Solostar Pen Inject 10 Units into the skin daily. (Patient taking differently: Inject 10 Units into the skin at bedtime.)   . ipratropium-albuterol (DUONEB) 0.5-2.5 (3) MG/3ML SOLN USE 1 AMPULE IN NEBULIZER EVERY 4 HOURS AS NEEDED (Patient taking differently: Inhale 3 mLs into the lungs every 4 (four) hours as needed (wheezing/shortness of breath).)   . loratadine (CLARITIN) 10 MG tablet Take 10 mg by mouth daily.   . montelukast (SINGULAIR) 10 MG tablet Take  1 tablet (10 mg total) by mouth at bedtime.   . Multiple Vitamin (MULTIVITAMIN WITH MINERALS) TABS tablet Take 1 tablet by mouth daily.   . pantoprazole (PROTONIX) 40 MG tablet Take 40 mg by mouth 2 (two) times daily.  05/04/2020: Awaiting refill authorization  . potassium chloride (KLOR-CON) 10 MEQ tablet Take 1 tablet (10 mEq total) by mouth 2 (two) times daily.   . theophylline (THEODUR) 300 MG 12 hr tablet Take 1 tablet (300 mg total) by mouth 2 (two)  times daily.   Marland Kitchen tiotropium (SPIRIVA HANDIHALER) 18 MCG inhalation capsule Place 1 capsule (18 mcg total) into inhaler and inhale daily. 05/04/2020: New medication patient has not started  . [DISCONTINUED] predniSONE (DELTASONE) 20 MG tablet TAKE AS DIRECTED (Patient not taking: No sig reported)   . [EXPIRED] ipratropium-albuterol (DUONEB) 0.5-2.5 (3) MG/3ML nebulizer solution 3 mL     No facility-administered encounter medications on file as of 05/22/2020.    Surgical History: Past Surgical History:  Procedure Laterality Date  . BLADDER SUSPENSION N/A 05/23/2020   Procedure: TRANSVAGINAL TAPE (TVT) PROCEDURE;  Surgeon: Gae Dry, MD;  Location: [facility] ORS;  Service: Gynecology;  Laterality: N/A;  . cataract surgery    . CHOLECYSTECTOMY    . COLONOSCOPY WITH PROPOFOL N/A 01/06/2018   Procedure: COLONOSCOPY WITH PROPOFOL;  Surgeon: Lollie Sails, MD;  Location: [facility] ENDOSCOPY;  Service: Endoscopy;  Laterality: N/A;  . CYSTOCELE REPAIR N/A 05/23/2020   Procedure: ANTERIOR REPAIR (CYSTOCELE);  Surgeon: Gae Dry, MD;  Location: [facility] ORS;  Service: Gynecology;  Laterality: N/A;  . CYSTOSCOPY N/A 05/23/2020   Procedure: CYSTOSCOPY;  Surgeon: Gae Dry, MD;  Location: [facility] ORS;  Service: Gynecology;  Laterality: N/A;  . ESOPHAGEAL DILATION    . ESOPHAGOGASTRODUODENOSCOPY (EGD) WITH PROPOFOL N/A 01/06/2018   Procedure: ESOPHAGOGASTRODUODENOSCOPY (EGD) WITH PROPOFOL;  Surgeon: Lollie Sails, MD;  Location: [facility];  Service: Endoscopy;  Laterality: N/A;  . EYE SURGERY Bilateral 2014   cataract   . HERNIA REPAIR    . hiatial hernia repair    . NASAL SINUS SURGERY    . PUBOVAGINAL SLING N/A 05/23/2020   Procedure: Gaynelle Arabian;  Surgeon: Gae Dry, MD;  Location: [facility] ORS;  Service: Gynecology;  Laterality: N/A;  . RECTOCELE REPAIR  05/23/2020   Procedure: POSTERIOR REPAIR (RECTOCELE);  Surgeon: Gae Dry, MD;  Location: [facility] ORS;  Service:  Gynecology;;  . VAGINAL HYSTERECTOMY Bilateral 05/23/2020   Procedure: HYSTERECTOMY VAGINAL BILATERIAL SALPINGO OOPHORECTOMY;  Surgeon: Gae Dry, MD;  Location: [facility] ORS;  Service: Gynecology;  Laterality: Bilateral;  LEFT fallopian tube, BILAT ovaries    Medical History: Past Medical History:  Diagnosis Date  . Anemia   . Asthma   . CHF (congestive heart failure) ([place])    1991- unknown after asthma attack  . Chicken pox   . Complication of anesthesia 1998   woke up during procedure   . COPD (chronic obstructive pulmonary disease) ([place])   . Diabetes ([place])    Type II   . 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 DVT (deep venous thrombosis) ([place])   . GERD (gastroesophageal reflux disease)   . Hernia, hiatal 2007  . HLD (hyperlipidemia)   . Hypertension   . Hypertension   . Pneumonia   . Seasonal allergies     Family History: Family History  Problem Relation Age of Onset  . Hypertension Mother   . Lung cancer  Mother   . Colon cancer Father   . Colon polyps Sister   . Diabetes Sister     Social History   Socioeconomic History  . Marital status: Married    Spouse name: Not on file  . Number of children: Not on file  . Years of education: Not on file  . Highest education level: Not on file  Occupational History  . Not on file  Tobacco Use  . Smoking status: Never Smoker  . Smokeless tobacco: Never Used  Vaping Use  . Vaping Use: Never used  Substance and Sexual Activity  . Alcohol use: Yes    Comment: glass of wine rarely  . Drug use: No  . Sexual activity: Not on file  Other Topics Concern  . Not on file  Social History Narrative   Lives at home with family. Independent   Social Determinants of Health   Financial Resource Strain: Not on file  Food Insecurity: Not on file  Transportation Needs: Not on file  Physical Activity: Not on file  Stress: Not on file  Social Connections: Not on file  Intimate Partner Violence: Not on  file      Review of Systems  Constitutional: Negative for chills, diaphoresis and fatigue.  HENT: Negative for ear pain, postnasal drip and sinus pressure.   Eyes: Negative for photophobia, discharge, redness, itching and visual disturbance.  Respiratory: Positive for cough, shortness of breath and wheezing.   Cardiovascular: Negative for chest pain, palpitations and leg swelling.  Gastrointestinal: Negative for abdominal pain, constipation, diarrhea, nausea and vomiting.  Genitourinary: Negative for dysuria and flank pain.  Musculoskeletal: Negative for arthralgias, back pain, gait problem and neck pain.  Skin: Negative for color change.  Allergic/Immunologic: Negative for environmental allergies and food allergies.  Neurological: Negative for dizziness and headaches.  Hematological: Does not bruise/bleed easily.  Psychiatric/Behavioral: Negative for agitation, behavioral problems (depression) and hallucinations.    Vital Signs: BP 128/70   Pulse 100   Temp 97.7 F (36.5 C)   Resp 16   Ht 5' 3"  (1.6 m)   Wt 195 lb 3.2 oz (88.5 kg)   SpO2 95%   BMI 34.58 kg/m    Physical Exam Vitals reviewed.  Constitutional:      Appearance: Normal appearance. She is normal weight.  Cardiovascular:     Rate and Rhythm: Normal rate and regular rhythm.     Pulses: Normal pulses.     Heart sounds: Normal heart sounds.  Pulmonary:     Effort: Pulmonary effort is normal.     Breath sounds: Wheezing present.  Abdominal:     General: Abdomen is flat.     Palpations: Abdomen is soft.  Musculoskeletal:        General: Normal range of motion.     Cervical back: Normal range of motion.  Skin:    General: Skin is warm.  Neurological:     General: No focal deficit present.     Mental Status: She is alert and oriented to person, place, and time. Mental status is at baseline.  Psychiatric:        Mood and Affect: Mood normal.        Behavior: Behavior normal.        Thought Content:  Thought content normal.        Judgment: Judgment normal.    Assessment/Plan: 1. Type 2 diabetes mellitus with hyperglycemia, with long-term current use of insulin (HCC) A1C 7.5 today, improved since last check--will  continue with current dosing due to upcoming surgery, will follow-up once recovered and work on tighter glucose control - POCT HgB A1C  2. Chronic obstructive pulmonary disease, unspecified COPD type ([place]) Bilateral wheezing throughout--improved after nebulizer treatment Will need close follow-up with pulmonology after surgery  3. Essential hypertension BP and HR well controlled, continue to monitor  4. SOB (shortness of breath) - ipratropium-albuterol (DUONEB) 0.5-2.5 (3) MG/3ML nebulizer solution 3 mL  General Counseling: Debanhi verbalizes understanding of the findings of todays visit and agrees with plan of treatment. I have discussed any further diagnostic evaluation that may be needed or ordered today. We also reviewed her medications today. she has been encouraged to call the office with any questions or concerns that should arise related to todays visit.    Orders Placed This Encounter  Procedures  . POCT HgB A1C    Meds ordered this encounter  Medications  . ipratropium-albuterol (DUONEB) 0.5-2.5 (3) MG/3ML nebulizer solution 3 mL    Time spent: 30 Minutes Time spent includes review of chart, medications, test results and follow-up plan with the patient.  This patient was seen by Theodoro Grist AGNP-C in Collaboration with Dr Lavera Guise as a part of collaborative care agreement     Tanna Furry. Arnett Galindez AGNP-C Internal medicine

## 2020-05-23 ENCOUNTER — Ambulatory Visit: Payer: HMO | Admitting: Certified Registered Nurse Anesthetist

## 2020-05-23 ENCOUNTER — Encounter: Payer: Self-pay | Admitting: Obstetrics & Gynecology

## 2020-05-23 ENCOUNTER — Encounter
Admission: RE | Disposition: A | Discharge status: Home / Self Care | Payer: Self-pay | Source: Ambulatory Visit | Attending: Obstetrics & Gynecology

## 2020-05-23 ENCOUNTER — Ambulatory Visit
Admission: RE | Admit: 2020-05-23 | Discharge: 2020-05-23 | Disposition: A | Discharge status: Home / Self Care | Payer: HMO | Source: Ambulatory Visit | Attending: Obstetrics & Gynecology | Admitting: Obstetrics & Gynecology

## 2020-05-23 ENCOUNTER — Encounter: Payer: Self-pay | Admitting: Hospice and Palliative Medicine

## 2020-05-23 DIAGNOSIS — N800 Endometriosis of uterus: Secondary | ICD-10-CM | POA: Diagnosis not present

## 2020-05-23 DIAGNOSIS — E1165 Type 2 diabetes mellitus with hyperglycemia: Secondary | ICD-10-CM | POA: Diagnosis not present

## 2020-05-23 DIAGNOSIS — Z794 Long term (current) use of insulin: Secondary | ICD-10-CM | POA: Insufficient documentation

## 2020-05-23 DIAGNOSIS — R32 Unspecified urinary incontinence: Secondary | ICD-10-CM | POA: Diagnosis not present

## 2020-05-23 DIAGNOSIS — D259 Leiomyoma of uterus, unspecified: Secondary | ICD-10-CM | POA: Diagnosis not present

## 2020-05-23 DIAGNOSIS — N816 Rectocele: Secondary | ICD-10-CM | POA: Diagnosis present

## 2020-05-23 DIAGNOSIS — Z7982 Long term (current) use of aspirin: Secondary | ICD-10-CM | POA: Insufficient documentation

## 2020-05-23 DIAGNOSIS — N8111 Cystocele, midline: Secondary | ICD-10-CM | POA: Diagnosis present

## 2020-05-23 DIAGNOSIS — Z833 Family history of diabetes mellitus: Secondary | ICD-10-CM | POA: Insufficient documentation

## 2020-05-23 DIAGNOSIS — Z8619 Personal history of other infectious and parasitic diseases: Secondary | ICD-10-CM | POA: Diagnosis not present

## 2020-05-23 DIAGNOSIS — Z880 Allergy status to penicillin: Secondary | ICD-10-CM | POA: Insufficient documentation

## 2020-05-23 DIAGNOSIS — N393 Stress incontinence (female) (male): Secondary | ICD-10-CM | POA: Diagnosis not present

## 2020-05-23 DIAGNOSIS — D261 Other benign neoplasm of corpus uteri: Secondary | ICD-10-CM | POA: Diagnosis not present

## 2020-05-23 DIAGNOSIS — K219 Gastro-esophageal reflux disease without esophagitis: Secondary | ICD-10-CM | POA: Diagnosis not present

## 2020-05-23 DIAGNOSIS — Z9049 Acquired absence of other specified parts of digestive tract: Secondary | ICD-10-CM | POA: Diagnosis not present

## 2020-05-23 DIAGNOSIS — Z7951 Long term (current) use of inhaled steroids: Secondary | ICD-10-CM | POA: Insufficient documentation

## 2020-05-23 DIAGNOSIS — Z881 Allergy status to other antibiotic agents status: Secondary | ICD-10-CM | POA: Insufficient documentation

## 2020-05-23 DIAGNOSIS — J449 Chronic obstructive pulmonary disease, unspecified: Secondary | ICD-10-CM | POA: Diagnosis not present

## 2020-05-23 DIAGNOSIS — E785 Hyperlipidemia, unspecified: Secondary | ICD-10-CM | POA: Diagnosis not present

## 2020-05-23 DIAGNOSIS — N888 Other specified noninflammatory disorders of cervix uteri: Secondary | ICD-10-CM | POA: Diagnosis not present

## 2020-05-23 DIAGNOSIS — I110 Hypertensive heart disease with heart failure: Secondary | ICD-10-CM | POA: Diagnosis not present

## 2020-05-23 DIAGNOSIS — Z8249 Family history of ischemic heart disease and other diseases of the circulatory system: Secondary | ICD-10-CM | POA: Insufficient documentation

## 2020-05-23 DIAGNOSIS — Z86718 Personal history of other venous thrombosis and embolism: Secondary | ICD-10-CM | POA: Diagnosis not present

## 2020-05-23 DIAGNOSIS — E119 Type 2 diabetes mellitus without complications: Secondary | ICD-10-CM | POA: Diagnosis not present

## 2020-05-23 DIAGNOSIS — N814 Uterovaginal prolapse, unspecified: Secondary | ICD-10-CM

## 2020-05-23 DIAGNOSIS — I509 Heart failure, unspecified: Secondary | ICD-10-CM | POA: Diagnosis not present

## 2020-05-23 DIAGNOSIS — E1122 Type 2 diabetes mellitus with diabetic chronic kidney disease: Secondary | ICD-10-CM | POA: Diagnosis not present

## 2020-05-23 DIAGNOSIS — Z79899 Other long term (current) drug therapy: Secondary | ICD-10-CM | POA: Insufficient documentation

## 2020-05-23 DIAGNOSIS — K449 Diaphragmatic hernia without obstruction or gangrene: Secondary | ICD-10-CM | POA: Diagnosis not present

## 2020-05-23 HISTORY — PX: RECTOCELE REPAIR: SHX761

## 2020-05-23 HISTORY — PX: VAGINAL HYSTERECTOMY: SHX2639

## 2020-05-23 HISTORY — PX: BLADDER SUSPENSION: SHX72

## 2020-05-23 HISTORY — PX: CYSTOCELE REPAIR: SHX163

## 2020-05-23 HISTORY — PX: PUBOVAGINAL SLING: SHX1035

## 2020-05-23 HISTORY — PX: CYSTOSCOPY: SHX5120

## 2020-05-23 LAB — GLUCOSE, CAPILLARY
Glucose-Capillary: 348 mg/dL — ABNORMAL HIGH (ref 70–99)
Glucose-Capillary: 351 mg/dL — ABNORMAL HIGH (ref 70–99)
Glucose-Capillary: 315 mg/dL — ABNORMAL HIGH (ref 70–99)
Glucose-Capillary: 291 mg/dL — ABNORMAL HIGH (ref 70–99)

## 2020-05-23 LAB — ABO/RH: ABO/RH(D): O POS

## 2020-05-23 SURGERY — HYSTERECTOMY, VAGINAL
Anesthesia: General

## 2020-05-23 MED ORDER — INSULIN ASPART 100 UNIT/ML ~~LOC~~ SOLN
SUBCUTANEOUS | Status: AC
  Filled 2020-05-23: qty 1

## 2020-05-23 MED ORDER — INSULIN ASPART 100 UNIT/ML ~~LOC~~ SOLN: 6.0000 [IU] | Freq: Once | SUBCUTANEOUS | Status: AC

## 2020-05-23 MED ORDER — ALBUTEROL SULFATE HFA 108 (90 BASE) MCG/ACT IN AERS
INHALATION_SPRAY | RESPIRATORY_TRACT | Status: DC | PRN
  Administered 2020-05-23: 6 via RESPIRATORY_TRACT

## 2020-05-23 MED ORDER — ROCURONIUM BROMIDE 100 MG/10ML IV SOLN
INTRAVENOUS | Status: DC | PRN
  Administered 2020-05-23: 50 mg via INTRAVENOUS
  Administered 2020-05-23 (×2): 10 mg via INTRAVENOUS

## 2020-05-23 MED ORDER — SUGAMMADEX SODIUM 200 MG/2ML IV SOLN
INTRAVENOUS | Status: DC | PRN
  Administered 2020-05-23: 200 mg via INTRAVENOUS

## 2020-05-23 MED ORDER — ONDANSETRON HCL 4 MG/2ML IJ SOLN: 4.0000 mg | Freq: Once | INTRAMUSCULAR | Status: DC | PRN

## 2020-05-23 MED ORDER — LIDOCAINE-EPINEPHRINE 1 %-1:100000 IJ SOLN
INTRAMUSCULAR | Status: DC | PRN
  Administered 2020-05-23: 7 mL
  Administered 2020-05-23: 5 mL
  Administered 2020-05-23: 15 mL
  Administered 2020-05-23: 20 mL

## 2020-05-23 MED ORDER — PROPOFOL 10 MG/ML IV BOLUS
INTRAVENOUS | Status: DC | PRN
  Administered 2020-05-23: 150 mg via INTRAVENOUS
  Administered 2020-05-23: 50 mg via INTRAVENOUS
  Administered 2020-05-23: 40 mg via INTRAVENOUS

## 2020-05-23 MED ORDER — INSULIN ASPART 100 UNIT/ML ~~LOC~~ SOLN
8.0000 [IU] | Freq: Once | SUBCUTANEOUS | Status: AC
  Administered 2020-05-23: 8 [IU] via SUBCUTANEOUS

## 2020-05-23 MED ORDER — MIDAZOLAM HCL 2 MG/2ML IJ SOLN
INTRAMUSCULAR | Status: DC | PRN
  Administered 2020-05-23: 2 mg via INTRAVENOUS

## 2020-05-23 MED ORDER — FENTANYL CITRATE (PF) 100 MCG/2ML IJ SOLN
INTRAMUSCULAR | Status: AC
  Filled 2020-05-23: qty 2

## 2020-05-23 MED ORDER — SUCCINYLCHOLINE CHLORIDE 200 MG/10ML IV SOSY
PREFILLED_SYRINGE | INTRAVENOUS | Status: AC
  Filled 2020-05-23: qty 10

## 2020-05-23 MED ORDER — ESTROGENS, CONJUGATED 0.625 MG/GM VA CREA
TOPICAL_CREAM | VAGINAL | Status: AC
  Filled 2020-05-23: qty 30

## 2020-05-23 MED ORDER — INSULIN ASPART 100 UNIT/ML ~~LOC~~ SOLN
SUBCUTANEOUS | Status: AC
  Administered 2020-05-23: 3 [IU]
  Filled 2020-05-23: qty 1

## 2020-05-23 MED ORDER — PROPOFOL 10 MG/ML IV BOLUS
INTRAVENOUS | Status: AC
  Filled 2020-05-23: qty 60

## 2020-05-23 MED ORDER — ESMOLOL HCL 100 MG/10ML IV SOLN
INTRAVENOUS | Status: DC | PRN
  Administered 2020-05-23: 10 mg via INTRAVENOUS
  Administered 2020-05-23: 20 mg via INTRAVENOUS

## 2020-05-23 MED ORDER — CHLORHEXIDINE GLUCONATE 0.12 % MT SOLN
OROMUCOSAL | Status: AC
  Administered 2020-05-23: 15 mL via OROMUCOSAL
  Filled 2020-05-23: qty 15

## 2020-05-23 MED ORDER — MIDAZOLAM HCL 2 MG/2ML IJ SOLN
INTRAMUSCULAR | Status: AC
  Filled 2020-05-23: qty 2

## 2020-05-23 MED ORDER — ACETAMINOPHEN 10 MG/ML IV SOLN
INTRAVENOUS | Status: AC
  Filled 2020-05-23: qty 100

## 2020-05-23 MED ORDER — FENTANYL CITRATE (PF) 100 MCG/2ML IJ SOLN
INTRAMUSCULAR | Status: DC | PRN
  Administered 2020-05-23 (×2): 50 ug via INTRAVENOUS

## 2020-05-23 MED ORDER — MORPHINE SULFATE (PF) 2 MG/ML IV SOLN: 1.0000 mg | INTRAVENOUS | Status: DC | PRN

## 2020-05-23 MED ORDER — OXYCODONE-ACETAMINOPHEN 5-325 MG PO TABS
ORAL_TABLET | ORAL | Status: AC
  Filled 2020-05-23: qty 1

## 2020-05-23 MED ORDER — INSULIN ASPART 100 UNIT/ML ~~LOC~~ SOLN
SUBCUTANEOUS | Status: AC
  Administered 2020-05-23: 6 [IU] via SUBCUTANEOUS
  Filled 2020-05-23: qty 1

## 2020-05-23 MED ORDER — ONDANSETRON HCL 4 MG/2ML IJ SOLN
INTRAMUSCULAR | Status: AC
  Filled 2020-05-23: qty 2

## 2020-05-23 MED ORDER — LIDOCAINE-EPINEPHRINE 1 %-1:100000 IJ SOLN
INTRAMUSCULAR | Status: AC
  Filled 2020-05-23: qty 1

## 2020-05-23 MED ORDER — ACETAMINOPHEN 10 MG/ML IV SOLN
INTRAVENOUS | Status: DC | PRN
  Administered 2020-05-23: 1000 mg via INTRAVENOUS

## 2020-05-23 MED ORDER — OXYCODONE-ACETAMINOPHEN 5-325 MG PO TABS: 1.0000 | ORAL_TABLET | ORAL | 0 refills | Status: DC | PRN

## 2020-05-23 MED ORDER — LACTATED RINGERS IV SOLN: INTRAVENOUS | Status: DC

## 2020-05-23 MED ORDER — FENTANYL CITRATE (PF) 100 MCG/2ML IJ SOLN
INTRAMUSCULAR | Status: AC
  Administered 2020-05-23: 25 ug via INTRAVENOUS
  Filled 2020-05-23: qty 2

## 2020-05-23 MED ORDER — PROPOFOL 10 MG/ML IV BOLUS
INTRAVENOUS | Status: AC
  Filled 2020-05-23: qty 20

## 2020-05-23 MED ORDER — ESMOLOL HCL 100 MG/10ML IV SOLN
INTRAVENOUS | Status: AC
  Filled 2020-05-23: qty 10

## 2020-05-23 MED ORDER — ESTROGENS, CONJUGATED 0.625 MG/GM VA CREA
TOPICAL_CREAM | VAGINAL | Status: DC | PRN
  Administered 2020-05-23: 1 via VAGINAL

## 2020-05-23 MED ORDER — ACETAMINOPHEN 325 MG PO TABS: 650.0000 mg | ORAL_TABLET | ORAL | Status: DC | PRN

## 2020-05-23 MED ORDER — METHYLPREDNISOLONE SODIUM SUCC 125 MG IJ SOLR
INTRAMUSCULAR | Status: AC
  Filled 2020-05-23: qty 2

## 2020-05-23 MED ORDER — OXYCODONE-ACETAMINOPHEN 5-325 MG PO TABS: 1.0000 | ORAL_TABLET | Freq: Once | ORAL | Status: DC

## 2020-05-23 MED ORDER — SUCCINYLCHOLINE CHLORIDE 20 MG/ML IJ SOLN
INTRAMUSCULAR | Status: DC | PRN
  Administered 2020-05-23: 100 mg via INTRAVENOUS

## 2020-05-23 MED ORDER — LIDOCAINE HCL (CARDIAC) PF 100 MG/5ML IV SOSY
PREFILLED_SYRINGE | INTRAVENOUS | Status: DC | PRN
  Administered 2020-05-23: 80 mg via INTRAVENOUS

## 2020-05-23 MED ORDER — METHYLPREDNISOLONE SODIUM SUCC 125 MG IJ SOLR
INTRAMUSCULAR | Status: DC | PRN
  Administered 2020-05-23: 60 mg via INTRAVENOUS

## 2020-05-23 MED ORDER — FENTANYL CITRATE (PF) 100 MCG/2ML IJ SOLN
25.0000 ug | INTRAMUSCULAR | Status: DC | PRN
  Administered 2020-05-23 (×2): 25 ug via INTRAVENOUS

## 2020-05-23 MED ORDER — ALBUTEROL SULFATE HFA 108 (90 BASE) MCG/ACT IN AERS
INHALATION_SPRAY | RESPIRATORY_TRACT | Status: AC
  Filled 2020-05-23: qty 6.7

## 2020-05-23 MED ORDER — ONDANSETRON HCL 4 MG/2ML IJ SOLN
INTRAMUSCULAR | Status: DC | PRN
  Administered 2020-05-23: 4 mg via INTRAVENOUS

## 2020-05-23 MED ORDER — ROCURONIUM BROMIDE 10 MG/ML (PF) SYRINGE
PREFILLED_SYRINGE | INTRAVENOUS | Status: AC
  Filled 2020-05-23: qty 10

## 2020-05-23 MED ORDER — ACETAMINOPHEN 650 MG RE SUPP
650.0000 mg | RECTAL | Status: DC | PRN
  Filled 2020-05-23: qty 1

## 2020-05-23 MED ORDER — LIDOCAINE HCL (PF) 2 % IJ SOLN
INTRAMUSCULAR | Status: AC
  Filled 2020-05-23: qty 5

## 2020-05-23 MED ORDER — OXYCODONE-ACETAMINOPHEN 5-325 MG PO TABS
1.0000 | ORAL_TABLET | ORAL | Status: DC | PRN
  Administered 2020-05-23: 1 via ORAL

## 2020-05-23 SURGICAL SUPPLY — 65 items
ADH SKN CLS APL DERMABOND .7 (GAUZE/BANDAGES/DRESSINGS) ×3
BAG DRN RND TRDRP ANRFLXCHMBR (UROLOGICAL SUPPLIES) ×3
BAG URINE DRAIN 2000ML AR STRL (UROLOGICAL SUPPLIES) ×4 IMPLANT
BLADE SURG 15 STRL LF DISP TIS (BLADE) ×3 IMPLANT
BLADE SURG 15 STRL SS (BLADE) ×4
BLADE SURG SZ10 CARB STEEL (BLADE) ×4 IMPLANT
BNDG GAUZE 4.5X4.1 6PLY STRL (MISCELLANEOUS) ×4 IMPLANT
CANISTER SUCT 1200ML W/VALVE (MISCELLANEOUS) ×4 IMPLANT
CATH FOLEY 2WAY  5CC 16FR (CATHETERS) ×1
CATH FOLEY 2WAY 5CC 16FR (CATHETERS) ×3
CATH URTH 16FR FL 2W BLN LF (CATHETERS) ×3 IMPLANT
COVER WAND RF STERILE (DRAPES) ×4 IMPLANT
DERMABOND ADVANCED (GAUZE/BANDAGES/DRESSINGS) ×1
DERMABOND ADVANCED .7 DNX12 (GAUZE/BANDAGES/DRESSINGS) ×3 IMPLANT
DRAPE 3/4 80X56 (DRAPES) ×4 IMPLANT
DRAPE LAPAROTOMY 100X77 ABD (DRAPES) ×4 IMPLANT
DRAPE PERI LITHO V/GYN (MISCELLANEOUS) ×4 IMPLANT
DRAPE UNDER BUTTOCK W/FLU (DRAPES) ×4 IMPLANT
ELECT CAUTERY BLADE 6.4 (BLADE) ×4 IMPLANT
ELECT REM PT RETURN 9FT ADLT (ELECTROSURGICAL) ×4
ELECTRODE REM PT RTRN 9FT ADLT (ELECTROSURGICAL) ×3 IMPLANT
GAUZE 4X4 16PLY RFD (DISPOSABLE) ×4 IMPLANT
GAUZE PACK 2X3YD (PACKING) ×4 IMPLANT
GAUZE PACKING FOLDED 2  STR (GAUZE/BANDAGES/DRESSINGS) ×1
GAUZE PACKING FOLDED 2 STR (GAUZE/BANDAGES/DRESSINGS) ×3 IMPLANT
GLOVE SURG ENC MOIS LTX SZ8 (GLOVE) ×32 IMPLANT
GOWN STRL REUS W/ TWL LRG LVL3 (GOWN DISPOSABLE) ×9 IMPLANT
GOWN STRL REUS W/ TWL XL LVL3 (GOWN DISPOSABLE) ×3 IMPLANT
GOWN STRL REUS W/TWL LRG LVL3 (GOWN DISPOSABLE) ×12
GOWN STRL REUS W/TWL XL LVL3 (GOWN DISPOSABLE) ×4
HANDLE YANKAUER SUCT BULB TIP (MISCELLANEOUS) ×4 IMPLANT
IV LACTATED RINGERS 1000ML (IV SOLUTION) ×4 IMPLANT
LABEL OR SOLS (LABEL) ×4 IMPLANT
MANIFOLD NEPTUNE II (INSTRUMENTS) ×4 IMPLANT
NDL MAYO CATGUT SZ4 (NEEDLE) ×4 IMPLANT
NDL SAFETY ECLIPSE 18X1.5 (NEEDLE) ×3 IMPLANT
NEEDLE HYPO 18GX1.5 SHARP (NEEDLE) ×4
NEEDLE HYPO 22GX1.5 SAFETY (NEEDLE) ×4 IMPLANT
NEEDLE SPNL 22GX3.5 QUINCKE BK (NEEDLE) ×4 IMPLANT
NS IRRIG 500ML POUR BTL (IV SOLUTION) ×4 IMPLANT
PACK BASIN MINOR ARMC (MISCELLANEOUS) ×4 IMPLANT
PAD OB MATERNITY 4.3X12.25 (PERSONAL CARE ITEMS) ×4 IMPLANT
PAD PREP 24X41 OB/GYN DISP (PERSONAL CARE ITEMS) ×4 IMPLANT
PENCIL ELECTRO HAND CTR (MISCELLANEOUS) ×4 IMPLANT
RETRACTOR PHONTONGUIDE ADAPT (ADAPTER) IMPLANT
SET CYSTO W/LG BORE CLAMP LF (SET/KITS/TRAYS/PACK) ×4 IMPLANT
SLING TVT EXACT (Sling) ×4 IMPLANT
SOL PREP PVP 2OZ (MISCELLANEOUS) ×4
SOLUTION PREP PVP 2OZ (MISCELLANEOUS) ×3 IMPLANT
SPONGE LAP 4X18 RFD (DISPOSABLE) ×4 IMPLANT
STRAP SAFETY 5IN WIDE (MISCELLANEOUS) ×4 IMPLANT
SURGILUBE 2OZ TUBE FLIPTOP (MISCELLANEOUS) ×4 IMPLANT
SUT ETHIBOND NAB CT1 #1 30IN (SUTURE) ×4 IMPLANT
SUT VIC AB 0 CT1 27 (SUTURE) ×8
SUT VIC AB 0 CT1 27XCR 8 STRN (SUTURE) ×6 IMPLANT
SUT VIC AB 0 CT1 36 (SUTURE) ×4 IMPLANT
SUT VIC AB 1 CT1 36 (SUTURE) ×4 IMPLANT
SUT VIC AB 2-0 CT1 (SUTURE) ×4 IMPLANT
SUT VICRYL+ 3-0 36IN CT-1 (SUTURE) ×4 IMPLANT
SYR 10ML LL (SYRINGE) ×8 IMPLANT
SYR BULB IRRIG 60ML STRL (SYRINGE) ×4 IMPLANT
SYR CONTROL 10ML LL (SYRINGE) ×4 IMPLANT
SYR TOOMEY IRRIG 70ML (MISCELLANEOUS) ×4
SYRINGE TOOMEY IRRIG 70ML (MISCELLANEOUS) ×3 IMPLANT
TAPE TRANSPORE STRL 2 31045 (GAUZE/BANDAGES/DRESSINGS) ×4 IMPLANT

## 2020-05-23 NOTE — Anesthesia Preprocedure Evaluation (Signed)
Anesthesia Evaluation  Patient identified by MRN, date of birth, ID band Patient awake    Reviewed: Allergy & Precautions, H&P , NPO status , Patient's Chart, lab work & pertinent test results  History of Anesthesia Complications (+) DIFFICULT AIRWAY and history of anesthetic complications  Airway Mallampati: III  TM Distance: <3 FB Neck ROM: full    Dental  (+) Chipped, Poor Dentition, Missing   Pulmonary neg shortness of breath, asthma , pneumonia, COPD,  COPD inhaler,           Cardiovascular Exercise Tolerance: Good hypertension, (-) angina+CHF  (-) Past MI and (-) DOE      Neuro/Psych PSYCHIATRIC DISORDERS Anxiety  Neuromuscular disease negative neurological ROS  negative psych ROS   GI/Hepatic Neg liver ROS, hiatal hernia, GERD  Medicated and Controlled,  Endo/Other  diabetes, Type 2, Insulin Dependent  Renal/GU negative Renal ROS  Female GU complaint  negative genitourinary   Musculoskeletal  (+) Arthritis , Osteoarthritis,    Abdominal   Peds negative pediatric ROS (+)  Hematology negative hematology ROS (+) anemia ,   Anesthesia Other Findings Past Medical History: No date: Anemia No date: Asthma No date: CHF (congestive heart failure) (HCC)     Comment:  1991- unknown after asthma attack No date: Chicken pox 1448: Complication of anesthesia     Comment:  woke up during procedure  No date: COPD (chronic obstructive pulmonary disease) (HCC) No date: Diabetes ([place])     Comment:  Type II  No date: Difficult intubation     Comment:  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  No date: DVT (deep venous thrombosis) (HCC) No date: GERD (gastroesophageal reflux disease) 2007: Hernia, hiatal No date: HLD (hyperlipidemia) No date: Hypertension No date: Hypertension No date: Pneumonia No date: Seasonal allergies  Reproductive/Obstetrics negative OB ROS                              Anesthesia Physical  Anesthesia Plan  ASA: III  Anesthesia Plan: General   Post-op Pain Management:    Induction: Intravenous  PONV Risk Score and Plan:   Airway Management Planned: Oral ETT and Video Laryngoscope Planned  Additional Equipment:   Intra-op Plan:   Post-operative Plan: Extubation in OR  Informed Consent: I have reviewed the patients History and Physical, chart, labs and discussed the procedure including the risks, benefits and alternatives for the proposed anesthesia with the patient or authorized representative who has indicated his/her understanding and acceptance.     Dental Advisory Given  Plan Discussed with: Anesthesiologist, CRNA and Surgeon  Anesthesia Plan Comments: (Patient consented for risks of anesthesia including but not limited to:  - adverse reactions to medications - risk of intubation if required - damage to teeth, lips or other oral mucosa - sore throat or hoarseness - Damage to heart, brain, lungs or loss of life  Patient voiced understanding.)        Anesthesia Quick Evaluation

## 2020-05-23 NOTE — OR Nursing (Signed)
Unable to void. MD notified of 466cc in bladder.  Ordered foley.  Inserted with clear yellow urine returned

## 2020-05-23 NOTE — Transfer of Care (Signed)
Immediate Anesthesia Transfer of Care Note  Patient: Cassie Ross  Procedure(s) Performed: HYSTERECTOMY VAGINAL BILATERIAL SALPINGO OOPHORECTOMY (Bilateral ) ANTERIOR REPAIR (CYSTOCELE) (N/A ) TRANSVAGINAL TAPE (TVT) PROCEDURE (N/A ) PUBO-VAGINAL SLING (N/A ) CYSTOSCOPY (N/A ) POSTERIOR REPAIR (RECTOCELE)  Patient Location: PACU  Anesthesia Type:General  Level of Consciousness: awake, drowsy and patient cooperative  Airway & Oxygen Therapy: Patient Spontanous Breathing and Patient connected to face mask oxygen  Post-op Assessment: Report given to RN and Post -op Vital signs reviewed and stable  Post vital signs: Reviewed and stable  Last Vitals:  Vitals Value Taken Time  BP 139/72 05/23/20 1015  Temp 36.4 C 05/23/20 1015  Pulse 97 05/23/20 1015  Resp 21 05/23/20 1015  SpO2      Last Pain:  Vitals:   05/23/20 0624  TempSrc: Temporal  PainSc: 0-No pain         Complications: No complications documented.

## 2020-05-23 NOTE — Op Note (Signed)
Preoperative diagnosis: Uterine Prolapse, Cystocele, Rectocele, Stress incontinence  Postoperative diagnosis: Same  Procedure: Transvaginal hysterectomy, Bilateral Oophorectomy, Left Salpingectomy, Anterior and Posterior Colporrhaphy, Pubovaginal Sling Procedure (TVT)  Surgeon: Barnett Applebaum, M.D.  Assistant: Dr Gilman Schmidt, No other capable assistant available, in surgery requiring high level assistant.  Anesthesia: general  Findings: Small uterus and atrophic adnexa with prolapse, Cystocele, Rectocele.  Estimated blood loss: 50 cc  Specimen: Uterus and adnexa to pathology   Disposition: Returned to recovery unit in stable condition  Procedure: Patient was taken to the OR where she was placed in dorsal lithotomy in [place]. She was prepped and draped in the usual sterile fashion. A timeout was performed. Foley is placed into bladder. A speculum was placed inside the vagina. The cervix was visualized and grasped with a tenaculum. 1% lidocaine with epinephrine were injected paracervically. A bovie was used to make a circumferential incision around the vagina. An opened sponge was used to dissect the vagina off the cervix. The posterior peritoneum was entered sharply with Mayo scissors.  The anterior peritoneal cavity was entered sharply with careful dissection of the bladder off the underlying cervix. A Heaney clamp was used to clamp first the left uterosacral ligament and cardinal which was then cut and Haney suture ligated with 0 Vicryl stitch, the stitch was held and later attached to the vaginal mucosa. Similarly the right uterosacral ligament was clamped cut and suture ligated.  Sequential clamping, transecting and suture ligating up the broad to the uterine arteries were taken until the tubo-ovarian pedicles were encountered. The uterus was then amputated. The left utero-ovarian pedicle grasped with a Heaney clamp. The right utero-ovarian pedicle was similarly grasped with the Heaney  clamp. The right left ovaries were easily visualized. The left ovary and tube were grasped with Babcock clamp and a Heaney clamp placed behind this. Tube and ovary were removed and the IP ligated with a free tie followed by suture ligature. The right ovary were similarly grasped with a Babcock clamp and a Heaney clamp used to clamp behind this. The ovary were removed on the side and the IP was ligated with a free tie followed by suture ligature. Inspection of all pedicles revealed adequate hemostasis.  The peritoneum was then closed with a running pursestring suture of 0 Vicryl. The uterosacrals were plicated using a 1-0 Ethibond suture.  Vagina is irrigated.  Anterior colporrhaphy is performed.  Allis clamps are placed along the anterior vaginal wall, lidocaine is used to infiltrate the plane, and incision is made midline vertical.  Endopelvic fascia is dissected free of vaginal mucosa. The retropubic space is carefully dissected both with sharp and blunt dissection.  The rigid catheter guide is placed in the foley with appropriate deviation of the bladder while placing the sling.  Using the TVT Exact device and material, the trocar is placed through the right then the left retropubic space and then trough an exit incision in the mons pubis.  Cystoscopy is performed with saline distension of the bladder with no perforations noted.  200 mL is left in bladder.  The TVT sling is then pulled upward until contact is made along the bladder neck, and a Kelly clamp is used to ensure it is not cinched too tightly.  Suprapubic pressure is performed to ensure no leakage of urine.  The sleeves are then removed from the sling.  Incisions are closed with Dermabond.  Fascia is plicated w interrupted vicryl sutures.  Tissue is plicated over the sling as well to  provide additional support.  Vaginal incision is closed with a running locking Vicryl suture, to incoprate the hysterectomy incision as well, with care taken to  incorporate the uterosacral pedicles.   Posterior colporrhaphy is performed, all internal (not to the introitus, perineal body intact).  Allis clamps are placed along the posterior vaginal wall, lidocaine is used to infiltrate the plane, and incision is made midline vertical.  Endopelvic fascia is dissected free of vaginal mucosa.  Fascia is plicated w interrupted vicryl sutures.  Excess mucosa is excised.  Vaginal incision is closed with a running locking Vicryl suture.   Excellent hemostasis was noted at the end of the case.  The assistance of my assisting-physician was vital to resect and retract interchangably with self on each side.   Packing gauze w AVC cream is placed. A Foley catheter is left in  place inside her bladder. Clear, yellow urine was noted. All instrument needle and lap counts were correct x 2. Patient was awakened taken to recovery room in stable condition.  Hoyt Koch, MD 05/23/2020, 10:08 AM

## 2020-05-23 NOTE — Discharge Instructions (Signed)
Vaginal Hysterectomy, Care After The following information offers guidance on how to care for yourself after your procedure. Your health care provider may also give you more specific instructions. If you have problems or questions, contact your health care provider. What can I expect after the procedure? After the procedure, it is common to have:  Pain in the lower abdomen and vagina.  Vaginal bleeding and discharge for up to 1 week. You will need to use a sanitary pad after this procedure.  Difficulty having a bowel movement (constipation).  Temporary problems emptying the bladder.  Tiredness (fatigue).  Poor appetite.  Less interest in sex.  Feelings of sadness or other emotions. If your ovaries were also removed, it is also common to have symptoms of menopause, such as hot flashes, night sweats, and lack of sleep (insomnia). Follow these instructions at home: Medicines  Take over-the-counter and prescription medicines only as told by your health care provider.  Do not take aspirin or NSAIDs, such as ibuprofen. These medicines can cause bleeding.  Ask your health care provider if the medicine prescribed to you: ? Requires you to avoid driving or using machinery. ? Can cause constipation. You may need to take these actions to prevent or treat constipation:  Drink enough fluid to keep your urine pale yellow.  Take over-the-counter or prescription medicines.  Eat foods that are high in fiber, such as beans, whole grains, and fresh fruits and vegetables.  Limit foods that are high in fat and processed sugars, such as fried or sweet foods.   Activity  Rest as told by your health care provider.  Return to your normal activities as told by your health care provider. Ask your health care provider what activities are safe for you  Avoid sitting for a long time without moving. Get up to take short walks every 1-2 hours. This is important to improve blood flow and breathing. Ask  for help if you feel weak or unsteady.  Try to have someone home with you for 1-2 weeks to help you with everyday chores.  Do not lift anything that is heavier than 10 lb (4.5 kg), or the limit that you are told, until your health care provider says that it is safe.  If you were given a sedative during the procedure, it can affect you for several hours. Do not drive or operate machinery until your health care provider says that it is safe.   Lifestyle  Do not use any products that contain nicotine or tobacco. These products include cigarettes, chewing tobacco, and vaping devices, such as e-cigarettes. These can delay healing after surgery. If you need help quitting, ask your health care provider.  Do not drink alcohol until your health care provider approves. General instructions  Do not douche, use tampons, or have sex for at least 6 weeks, or as told by your health care provider.  If you struggle with physical or emotional changes after your procedure, speak with your health care provider or a therapist.  The stitches inside your vagina will dissolve over time and do not need to be taken out.  Do not take baths, swim, or use a hot tub until your health care provider approves. You may only be allowed to take showers for 2-3 weeks  Wear compression stockings as told by your health care provider. These stockings help to prevent blood clots and reduce swelling in your legs.  Keep all follow-up visits. This is important. Contact a health care provider if:  Your pain medicine is not helping.  You have a fever.  You have nausea or vomiting that does not go away.  You feel dizzy.  You have blood, pus, or a bad-smelling discharge from your vagina more than 1 week after the procedure.  You continue to have trouble urinating 3-5 days after the procedure. Get help right away if:  You have severe pain in your abdomen or back.  You faint.  You have heavy vaginal bleeding and blood  clots, soaking through a sanitary pad in less than 1 hour.  You have chest pain or shortness of breath.  You have pain, swelling, or redness in your leg. These symptoms may represent a serious problem that is an emergency. Do not wait to see if the symptoms will go away. Get medical help right away. Call your local emergency services (911 in the [place] Do not drive yourself to the hospital. Summary  After the procedure, it is common to have pain, vaginal bleeding, constipation, temporary problems emptying your bladder, and feelings of sadness or other emotions.  Take over-the-counter and prescription medicines only as told by your health care provider.  Rest as told by your health care provider. Return to your normal activities as told by your health care provider.  Contact a health care provider if your pain medicine is not helping, or you have a fever, dizziness, or trouble urinating several days after the procedure.  Get help right away if you have severe pain in your abdomen or back, or if you faint, have heavy bleeding, or have chest pain or shortness of breath. This information is not intended to replace advice given to you by your health care provider. Make sure you discuss any questions you have with your health care provider. Document Revised: 10/29/2019 Document Reviewed: 10/29/2019 Elsevier Patient Education  [place].   Anterior and Posterior Colporrhaphy and Sling Procedure, Care After The following information offers guidance on how to care for yourself after your procedure. Your health care provider may also give you more specific instructions. If you have problems or questions, contact your health care provider. What can I expect after the procedure? After the procedure, it is common to have:  Pain in the surgical area.  Vaginal spotting and discharge. You will need to use a sanitary pad during this time.  Tiredness (fatigue). Follow these instructions at  home: Medicines  Take over-the-counter and prescription medicines only as told by your health care provider.  If you were prescribed an antibiotic medicine, take it as told by your health care provider. Do not stop using the antibiotic even if you start to feel better.  Ask your health care provider if the medicine prescribed to you: ? Requires you to avoid driving or using machinery. ? Can cause constipation. You may need to take these actions to prevent or treat constipation:  Drink enough fluid to keep your urine pale yellow.  Take over-the-counter or prescription medicines.  Eat foods that are high in fiber, such as beans, whole grains, and fresh fruits and vegetables.  Limit foods that are high in fat and processed sugars, such as fried or sweet foods. Incision care  Check your incision area every day for signs of infection. Check for: ? More fluid or blood coming from your vagina. ? Pus or a bad-smelling discharge from your vagina. ? More pain or swelling in your vaginal area.  Do not take baths, swim, or use a hot tub until your health care  provider approves. You may take showers.  Keep the area between your vagina and rectum (perineal area) clean and dry. Make sure you clean the area after every bowel movement and each time you urinate.  Ask your health care provider if you can take a sitz bath or sit in a tub of clean, warm water. Activity  Rest as told by your health care provider.  Avoid sitting for a long time without moving. Get up to take short walks every 1-2 hours. This is important to improve blood flow and breathing. Ask for help if you feel weak or unsteady.  Avoid activities that take a lot of effort (are strenuous).  Do not lift anything that is heavier than 5 lb (2.3 kg), or the limit that you are told, until your health care provider says that it is safe.  Avoid pushing or pulling motions.  Return to your normal activities as told by your health care  provider. Ask your health care provider what activities are safe for you.   General instructions  You may be instructed to do pelvic floor exercises (Kegel exercises). Do them as told by your health care provider.  Wear compression stockings as told by your health care provider. These stockings help to prevent blood clots and reduce swelling in your legs.  If you have a urinary catheter in place, follow instructions from your health care provider about how to empty the catheter bag.  Do not douche, use tampons, or have sex until your health care provider says it is okay.  Keep all follow-up visits. This is important. Contact a health care provider if:  Medicine does not help your pain.  You have new bruising in the vaginal area.  You have frequent or urgent urination, or you are unable to completely empty your bladder.  You feel a burning sensation when urinating.  You have more fluid or blood coming from your vaginal area.  You have pus or a bad-smelling discharge coming from the vaginal area.  You have more pain or swelling in your vaginal area. Get help right away if:  You have a fever or chills.  You have heavy vaginal bleeding.  You cannot urinate or your catheter stops draining.  You have chest, abdominal, or leg pain.  You have trouble breathing. These symptoms may represent a serious problem that is an emergency. Do not wait to see if the symptoms will go away. Get medical help right away. Call your local emergency services (911 in the [place] Do not drive yourself to the hospital. Summary  After the procedure, it is common to have pain, fatigue, and vaginal spotting and discharge.  Keep the area between your vagina and rectum (perineal area) clean and dry. Make sure you clean the area after each bowel movement and each time you urinate.  Follow instructions from your health care provider on any activity restrictions after the procedure. This information is not  intended to replace advice given to you by your health care provider. Make sure you discuss any questions you have with your health care provider. Document Revised: 08/24/2019 Document Reviewed: 08/24/2019 Elsevier Patient Education  2021 [place]   1) The drugs that you were given will stay in your system until tomorrow so for the next 24 hours you should not:  A) Drive an automobile B) Make any legal decisions C) Drink any alcoholic beverage   2) You may resume regular meals tomorrow.  Today it  is better to start with liquids and gradually work up to solid foods.  You may eat anything you prefer, but it is better to start with liquids, then soup and crackers, and gradually work up to solid foods.   3) Please notify your doctor immediately if you have any unusual bleeding, trouble breathing, redness and pain at the surgery site, drainage, fever, or pain not relieved by medication.    4) Additional Instructions:   Please contact your physician with any problems or Same Day Surgery at [phone], Monday through Friday 6 am to 4 pm, or [place] at [facility] number at [phone].

## 2020-05-23 NOTE — Interval H&P Note (Signed)
History and Physical Interval Note:  05/23/2020 7:02 AM  Cassie Ross  has presented today for surgery, with the diagnosis of Uterine prolapse         N81.4    Cystocele, midline       N81.11  Urinary Incontinence   N39.3 Rectocele        N81.6.  The various methods of treatment have been discussed with the patient and family. After consideration of risks, benefits and other options for treatment, the patient has consented to  Procedure(s): HYSTERECTOMY VAGINAL BILATERIAL SALPINGO OOPHORECTOMY (Bilateral) ANTERIOR REPAIR (CYSTOCELE) (N/A) TRANSVAGINAL TAPE (TVT) PROCEDURE (N/A) PUBO-VAGINAL SLING (N/A) CYSTOSCOPY (N/A)  POSTERIOR REPAIR as a surgical intervention.  The patient's history has been reviewed, patient examined, no change in status, stable for surgery.  I have reviewed the patient's chart and labs.  Questions were answered to the patient's satisfaction.     Hoyt Koch

## 2020-05-23 NOTE — Anesthesia Procedure Notes (Signed)
Procedure Name: Intubation Date/Time: 05/23/2020 7:48 AM Performed by: Lowry Bowl, CRNA Pre-anesthesia Checklist: Patient identified, Emergency Drugs available, Suction available and Patient being monitored Patient Re-evaluated:Patient Re-evaluated prior to induction Oxygen Delivery Method: Circle system utilized Preoxygenation: Pre-oxygenation with 100% oxygen Induction Type: IV induction, Cricoid Pressure applied and Rapid sequence Ventilation: Mask ventilation without difficulty Laryngoscope Size: 3 and McGraph Grade View: Grade I Tube type: Oral Tube size: 6.5 mm Number of attempts: 1 Airway Equipment and Method: Stylet and Video-laryngoscopy Placement Confirmation: ETT inserted through vocal cords under direct vision,  positive ETCO2 and breath sounds checked- equal and bilateral Secured at: 20 cm Tube secured with: Tape Dental Injury: Teeth and Oropharynx as per pre-operative assessment

## 2020-05-24 LAB — SURGICAL PATHOLOGY

## 2020-05-25 ENCOUNTER — Encounter: Payer: Self-pay | Admitting: Obstetrics & Gynecology

## 2020-05-25 ENCOUNTER — Other Ambulatory Visit: Payer: Self-pay

## 2020-05-25 ENCOUNTER — Telehealth: Payer: Self-pay

## 2020-05-25 ENCOUNTER — Ambulatory Visit (INDEPENDENT_AMBULATORY_CARE_PROVIDER_SITE_OTHER): Payer: HMO | Admitting: Obstetrics & Gynecology

## 2020-05-25 DIAGNOSIS — Z90710 Acquired absence of both cervix and uterus: Secondary | ICD-10-CM

## 2020-05-25 NOTE — Telephone Encounter (Signed)
Spoke w/patient. She was able to void after she got home, she has had at least 16 oz of fluid and it's been more than 1 hour and she has tried, but is unable to void. She is feeling pressure and is very uncomfortable. Patient advised to return to clinic ASAP per Dr. Kenton Kingfisher. She is on the way. [place] desk aware.

## 2020-05-25 NOTE — Anesthesia Postprocedure Evaluation (Signed)
Anesthesia Post Note  Patient: Cassie Ross  Procedure(s) Performed: HYSTERECTOMY VAGINAL BILATERIAL SALPINGO OOPHORECTOMY (Bilateral ) ANTERIOR REPAIR (CYSTOCELE) (N/A ) TRANSVAGINAL TAPE (TVT) PROCEDURE (N/A ) PUBO-VAGINAL SLING (N/A ) CYSTOSCOPY (N/A ) POSTERIOR REPAIR (RECTOCELE)  Patient location during evaluation: PACU Anesthesia Type: General Level of consciousness: awake and alert and oriented Pain management: pain level controlled Vital Signs Assessment: post-procedure vital signs reviewed and stable Respiratory status: spontaneous breathing Cardiovascular status: blood pressure returned to baseline Anesthetic complications: no   No complications documented.   Last Vitals:  Vitals:   05/23/20 1208 05/23/20 1351  BP: (!) 145/74 (!) 141/72  Pulse: 99 88  Resp: 16 16  Temp: 36.4 C (!) 36.1 C  SpO2: 97% 97%    Last Pain:  Vitals:   05/24/20 1018  TempSrc:   PainSc: 7                  Kayhan Boardley

## 2020-05-25 NOTE — Telephone Encounter (Signed)
Patient had catheter removed this morning by [facility]. She was able to use the bathroom, but now it has stopped and she is having pressure. RF#[phone]

## 2020-05-25 NOTE — Progress Notes (Addendum)
  Postoperative Follow-up Patient presents post op from vaginal hysterectomy, anterior colporrhaphy and sling for pelvic relaxation and urinary incontinence, 2 days ago.  Subjective: Patient reports some improvement in her preop symptoms. Eating a regular diet without difficulty. Pain is controlled with current analgesics. Medications being used: acetaminophen and narcotic analgesics including oxycodone/acetaminophen (Percocet, Tylox).  Activity: sedentary. Patient reports additional symptom's since surgery of No bleeding.  Foley uncomfortable but no problems w it.  Foley due to inability to void immediately after surgery.  Objective: There were no vitals taken for this visit. Physical Exam Constitutional:      General: She is not in acute distress.    Appearance: She is well-developed.  Cardiovascular:     Rate and Rhythm: Normal rate.  Pulmonary:     Effort: Pulmonary effort is normal.  Abdominal:     General: There is no distension.     Palpations: Abdomen is soft.     Tenderness: There is no abdominal tenderness.     Comments: Incision Healing Well   Musculoskeletal:        General: Normal range of motion.  Neurological:     Mental Status: She is alert and oriented to person, place, and time.     Cranial Nerves: No cranial nerve deficit.  Skin:    General: Skin is warm and dry.     Assessment: s/p :  vaginal hysterectomy, anterior colporrhaphy and sling' progressing well  Plan: Patient has done well after surgery with no apparent complications.  I have discussed the post-operative course to date, and the expected progress moving forward.  The patient understands what complications to be concerned about.  I will see the patient in routine follow up, or sooner if needed.    Foley removed today.  Must void in next 6 hours, and then monitor for normal voiding pattern.  Activity plan: No heavy lifting.  Pelvic rest.  Hoyt Koch 05/25/2020, 9:11 AM   ADDENDUM: Pt  returned w sx's of full bladder and inability to void completely. She reports 4 small voids, w decent amount of UOP per her opinion.  But she has steadily been having more pain as though she needs to empty her bladder.  Catheter placed, 1000 mL in bag right away.  Based on this finding, will leave catheter in for a few more days.  Option for in and out catheterization also discussed.  F/u Mon am  Barnett Applebaum, MD, [facility], [facility] 05/25/2020  2:52 PM

## 2020-05-29 ENCOUNTER — Ambulatory Visit (INDEPENDENT_AMBULATORY_CARE_PROVIDER_SITE_OTHER): Payer: HMO | Admitting: Obstetrics & Gynecology

## 2020-05-29 ENCOUNTER — Encounter: Payer: Self-pay | Admitting: Obstetrics & Gynecology

## 2020-05-29 ENCOUNTER — Other Ambulatory Visit: Payer: Self-pay

## 2020-05-29 VITALS — BP 130/82 | Ht 63.0 in | Wt 195.0 lb

## 2020-05-29 DIAGNOSIS — Z90710 Acquired absence of both cervix and uterus: Secondary | ICD-10-CM

## 2020-05-29 DIAGNOSIS — R339 Retention of urine, unspecified: Secondary | ICD-10-CM

## 2020-05-29 NOTE — Progress Notes (Signed)
  Postoperative Follow-up Patient presents post op from vaginal hysterectomy, anterior colporrhaphy and sling for pelvic relaxation, 1 week ago.  Subjective: Patient reports some improvement in her preop symptoms. Eating a regular diet without difficulty. The patient is not having any pain.  Activity: normal activities of daily living, limited by catheter still in place (bladder). Patient reports additional symptom's since surgery of occas spotting on pad and also in catheter bag  Objective: BP 130/82   Ht 5\' 3"  (1.6 m)   Wt 195 lb (88.5 kg)   BMI 34.54 kg/m  Physical Exam Constitutional:      General: She is not in acute distress.    Appearance: She is well-developed.  Cardiovascular:     Rate and Rhythm: Normal rate.  Pulmonary:     Effort: Pulmonary effort is normal.  Abdominal:     General: There is no distension.     Palpations: Abdomen is soft.     Tenderness: There is no abdominal tenderness.     Comments: Incision Healing Well   Musculoskeletal:        General: Normal range of motion.  Neurological:     Mental Status: She is alert and oriented to person, place, and time.     Cranial Nerves: No cranial nerve deficit.  Skin:    General: Skin is warm and dry.   Catheter removed without difficulty  Assessment:   ICD-10-CM   1. S/P vaginal hysterectomy  Z90.710   2. Urinary retention  R33.9   Catheter removed, voiding trial this am  Activity plan: No heavy lifting.Marland Kitchen  Pelvic rest.  Hoyt Koch 05/29/2020, 8:42 AM

## 2020-06-05 ENCOUNTER — Other Ambulatory Visit: Payer: Self-pay

## 2020-06-05 ENCOUNTER — Encounter: Payer: Self-pay | Admitting: Obstetrics & Gynecology

## 2020-06-05 ENCOUNTER — Ambulatory Visit (INDEPENDENT_AMBULATORY_CARE_PROVIDER_SITE_OTHER): Payer: HMO | Admitting: Obstetrics & Gynecology

## 2020-06-05 VITALS — BP 130/80 | Ht 63.0 in | Wt 192.0 lb

## 2020-06-05 DIAGNOSIS — Z90710 Acquired absence of both cervix and uterus: Secondary | ICD-10-CM

## 2020-06-05 NOTE — Progress Notes (Signed)
  Postoperative Follow-up Patient presents post op from vaginal hysterectomy, anterior colporrhaphy and sling for pelvic relaxation, 2 weeks ago. Path: DIAGNOSIS:  A. UTERUS WITH CERVIX, LEFT FALLOPIAN TUBE AND BILATERAL OVARIES; TOTAL  HYSTERECTOMY WITH BILATERAL OOPHORECTOMY AND LEFT SALPINGECTOMY:  - CERVIX WITH NABOTHIAN CYSTS AND CHANGES CONSISTENT WITH PROLAPSE.  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  - MYOMETRIUM WITH ADENOMYOSIS AND LEIOMYOMATA, LARGEST MEASURING 1.0 CM.  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  - LEFT FALLOPIAN TUBE WITH FIMBRIATED END.  - UNREMARKABLE BILATERAL OVARIES.  - NEGATIVE FOR ATYPIA AND MALIGNANCY.  Subjective: Patient reports marked improvement in her preop symptoms. Eating a regular diet without difficulty. The patient is not having any pain.  Activity: normal activities of daily living. Patient reports additional symptom's since surgery of No bleeding.  Voiding easily and with less pressure or frequency.  Objective: BP 130/80   Ht 5\' 3"  (1.6 m)   Wt 192 lb (87.1 kg)   BMI 34.01 kg/m  Physical Exam Constitutional:      General: She is not in acute distress.    Appearance: She is well-developed.  Musculoskeletal:        General: Normal range of motion.  Neurological:     Mental Status: She is alert and oriented to person, place, and time.  Skin:    General: Skin is warm and dry.  Vitals reviewed.     Assessment: s/p :  vaginal hysterectomy, anterior colporrhaphy and sling progressing well  Plan: Patient has done well after surgery with no apparent complications.  I have discussed the post-operative course to date, and the expected progress moving forward.  The patient understands what complications to be concerned about.  I will see the patient in routine follow up, or sooner if needed.    Activity plan: No heavy lifting.  Pelvic rest. Vaginal inc check in 4 weeks Minimize coughing from asthma as well, as  able  Hoyt Koch 06/05/2020, 2:39 PM

## 2020-06-08 ENCOUNTER — Telehealth: Payer: Self-pay

## 2020-06-08 NOTE — Telephone Encounter (Signed)
Completed medical records for [facility]  Mailed to [address]  [place] [address] Faxed payment request to [phone] for $19.50

## 2020-06-12 ENCOUNTER — Telehealth: Payer: Self-pay | Admitting: Internal Medicine

## 2020-06-12 NOTE — Progress Notes (Signed)
  Chronic Care Management   Outreach Note  06/12/2020 Name: Cassie Ross MRN: [id number] DOB: 06/09/1960  Referred by: Lavera Guise, MD Reason for referral : No chief complaint on file.   An unsuccessful telephone outreach was attempted today. The patient was referred to the pharmacist for assistance with care management and care coordination.   Follow Up Plan:   Carley Perdue [employer] Scheduler

## 2020-06-16 ENCOUNTER — Telehealth: Payer: Self-pay

## 2020-06-16 NOTE — Telephone Encounter (Signed)
Completed med records for [facility]. Faxed to [phone]   [phone]

## 2020-07-04 ENCOUNTER — Ambulatory Visit (INDEPENDENT_AMBULATORY_CARE_PROVIDER_SITE_OTHER): Payer: HMO | Admitting: Obstetrics & Gynecology

## 2020-07-04 ENCOUNTER — Encounter: Payer: Self-pay | Admitting: Obstetrics & Gynecology

## 2020-07-04 ENCOUNTER — Other Ambulatory Visit: Payer: Self-pay | Admitting: Internal Medicine

## 2020-07-04 ENCOUNTER — Other Ambulatory Visit: Payer: Self-pay

## 2020-07-04 VITALS — BP 130/80 | Ht 63.0 in | Wt 191.0 lb

## 2020-07-04 DIAGNOSIS — J449 Chronic obstructive pulmonary disease, unspecified: Secondary | ICD-10-CM

## 2020-07-04 DIAGNOSIS — Z90710 Acquired absence of both cervix and uterus: Secondary | ICD-10-CM

## 2020-07-04 NOTE — Progress Notes (Signed)
  Postoperative Follow-up Patient presents post op from vaginal hysterectomy, anterior colporrhaphy and sling for pelvic relaxation and urinary incontinence, 6 weeks ago.  Subjective: Patient reports marked improvement in her preop symptoms. Eating a regular diet without difficulty. The patient is not having any pain.  Activity: normal activities of daily living. Patient reports additional symptom's since surgery of No bleeding and no leakage of urine  Objective: BP 130/80   Ht 5\' 3"  (1.6 m)   Wt 191 lb (86.6 kg)   BMI 33.83 kg/m  Physical Exam Constitutional:      General: She is not in acute distress.    Appearance: She is well-developed.  Genitourinary:     Bladder, rectum and urethral meatus normal.     Genitourinary Comments: Cervix and uterus absent. Vaginal cuff healing well.     Perineal sutures intact.    No vaginal erythema or bleeding.     Vaginal exam comments: Visible suture at cuff yet mucosa healing complete.      Right Adnexa: not tender and no mass present.    Left Adnexa: not tender and no mass present.    Cervix is absent.     Uterus is absent.     Pelvic exam was performed with patient in the lithotomy position.  Cardiovascular:     Rate and Rhythm: Normal rate.  Pulmonary:     Effort: Pulmonary effort is normal.  Abdominal:     General: There is no distension.     Palpations: Abdomen is soft.     Tenderness: There is no abdominal tenderness.     Comments: Incision healing well.  Musculoskeletal:        General: Normal range of motion.  Neurological:     Mental Status: She is alert and oriented to person, place, and time.     Cranial Nerves: No cranial nerve deficit.  Skin:    General: Skin is warm and dry.     Assessment: s/p :  vaginal hysterectomy, anterior colporrhaphy and pubo-vaginal sling progressing well  Plan: Patient has done well after surgery with no apparent complications.  I have discussed the post-operative course to date, and the  expected progress moving forward.  The patient understands what complications to be concerned about.  I will see the patient in routine follow up, or sooner if needed.    Activity plan: No restriction.  Hoyt Koch 07/04/2020, 2:12 PM

## 2020-07-04 NOTE — Progress Notes (Signed)
pft  

## 2020-07-07 ENCOUNTER — Telehealth: Payer: Self-pay | Admitting: Internal Medicine

## 2020-07-07 NOTE — Progress Notes (Signed)
  Chronic Care Management   Outreach Note  07/07/2020 Name: Cassie Ross MRN: [id number] DOB: [birth year]  Referred by: Lavera Guise, MD Reason for referral : No chief complaint on file.   A second unsuccessful telephone outreach was attempted today. The patient was referred to pharmacist for assistance with care management and care coordination.  Follow Up Plan:   Carley Perdue [employer] Scheduler

## 2020-07-12 ENCOUNTER — Ambulatory Visit: Payer: PPO | Admitting: Internal Medicine

## 2020-07-14 ENCOUNTER — Other Ambulatory Visit: Payer: Self-pay

## 2020-07-14 DIAGNOSIS — J209 Acute bronchitis, unspecified: Secondary | ICD-10-CM

## 2020-07-14 MED ORDER — ALBUTEROL SULFATE (2.5 MG/3ML) 0.083% IN NEBU: 2.5000 mg | INHALATION_SOLUTION | Freq: Four times a day (QID) | RESPIRATORY_TRACT | 1 refills | Status: DC | PRN

## 2020-07-19 ENCOUNTER — Telehealth: Payer: Self-pay

## 2020-07-19 NOTE — Telephone Encounter (Signed)
Spoke to insurance and to [employer] on albuterol solution and the pharmacy will try to send the medication thru her medicare part B.

## 2020-07-20 ENCOUNTER — Telehealth: Payer: Self-pay

## 2020-07-20 NOTE — Telephone Encounter (Signed)
LMOM for pt to inform her that we sent prescription to pharmacy thru Part [facility] and spoke to [facility] and they are preparing the rx and will notify pt when it is ready.

## 2020-07-26 ENCOUNTER — Ambulatory Visit: Payer: HMO | Admitting: Internal Medicine

## 2020-07-26 ENCOUNTER — Telehealth: Payer: Self-pay | Admitting: Internal Medicine

## 2020-07-26 NOTE — Progress Notes (Signed)
  Chronic Care Management   Outreach Note  07/26/2020 Name: Cassie Ross MRN: [id number] DOB: 1960/04/11  Referred by: Lavera Guise, MD Reason for referral : No chief complaint on file.   Third unsuccessful telephone outreach was attempted today. The patient was referred to the pharmacist for assistance with care management and care coordination.   Follow Up Plan:   [place]

## 2020-08-03 ENCOUNTER — Ambulatory Visit: Payer: HMO | Admitting: Nurse Practitioner

## 2020-08-03 ENCOUNTER — Encounter: Payer: Self-pay | Admitting: Nurse Practitioner

## 2020-08-03 ENCOUNTER — Other Ambulatory Visit: Payer: Self-pay

## 2020-08-03 VITALS — BP 152/83 | HR 75 | Temp 98.3°F | Resp 16 | Ht 63.0 in | Wt 190.8 lb

## 2020-08-03 DIAGNOSIS — E876 Hypokalemia: Secondary | ICD-10-CM | POA: Diagnosis not present

## 2020-08-03 DIAGNOSIS — J449 Chronic obstructive pulmonary disease, unspecified: Secondary | ICD-10-CM

## 2020-08-03 DIAGNOSIS — Z794 Long term (current) use of insulin: Secondary | ICD-10-CM | POA: Diagnosis not present

## 2020-08-03 DIAGNOSIS — E1165 Type 2 diabetes mellitus with hyperglycemia: Secondary | ICD-10-CM | POA: Diagnosis not present

## 2020-08-03 DIAGNOSIS — E782 Mixed hyperlipidemia: Secondary | ICD-10-CM | POA: Diagnosis not present

## 2020-08-03 DIAGNOSIS — K219 Gastro-esophageal reflux disease without esophagitis: Secondary | ICD-10-CM | POA: Diagnosis not present

## 2020-08-03 DIAGNOSIS — I10 Essential (primary) hypertension: Secondary | ICD-10-CM

## 2020-08-03 DIAGNOSIS — J4550 Severe persistent asthma, uncomplicated: Secondary | ICD-10-CM

## 2020-08-03 LAB — POCT GLYCOSYLATED HEMOGLOBIN (HGB A1C): Hemoglobin A1C: 8.6 % — AB (ref 4.0–5.6)

## 2020-08-03 LAB — GLUCOSE, POCT (MANUAL RESULT ENTRY): POC Glucose: 102 mg/dl — AB (ref 70–99)

## 2020-08-03 MED ORDER — NOVOLOG FLEXPEN 100 UNIT/ML ~~LOC~~ SOPN: 2.0000 [IU] | PEN_INJECTOR | SUBCUTANEOUS | 1 refills | Status: DC

## 2020-08-03 MED ORDER — THEOPHYLLINE ER 300 MG PO TB12: 300.0000 mg | ORAL_TABLET | Freq: Two times a day (BID) | ORAL | 1 refills | Status: DC

## 2020-08-03 MED ORDER — SPIRIVA HANDIHALER 18 MCG IN CAPS: 18.0000 ug | ORAL_CAPSULE | Freq: Every day | RESPIRATORY_TRACT | 12 refills | Status: DC

## 2020-08-03 MED ORDER — MONTELUKAST SODIUM 10 MG PO TABS: 10.0000 mg | ORAL_TABLET | Freq: Every day | ORAL | 1 refills | Status: DC

## 2020-08-03 MED ORDER — IPRATROPIUM-ALBUTEROL 0.5-2.5 (3) MG/3ML IN SOLN: 3.0000 mL | RESPIRATORY_TRACT | 2 refills | Status: DC | PRN

## 2020-08-03 MED ORDER — PANTOPRAZOLE SODIUM 40 MG PO TBEC
40.0000 mg | DELAYED_RELEASE_TABLET | Freq: Two times a day (BID) | ORAL | 2 refills | Status: DC
Start: 2020-08-03 — End: 2020-12-12

## 2020-08-03 MED ORDER — POTASSIUM CHLORIDE CRYS ER 10 MEQ PO TBCR
10.0000 meq | EXTENDED_RELEASE_TABLET | Freq: Two times a day (BID) | ORAL | 5 refills | Status: DC
Start: 2020-08-03 — End: 2020-09-20

## 2020-08-03 MED ORDER — LANTUS SOLOSTAR 100 UNIT/ML ~~LOC~~ SOPN: 10.0000 [IU] | PEN_INJECTOR | Freq: Every day | SUBCUTANEOUS | 1 refills | Status: DC

## 2020-08-03 MED ORDER — DILTIAZEM HCL ER COATED BEADS 180 MG PO CP24
180.0000 mg | ORAL_CAPSULE | Freq: Two times a day (BID) | ORAL | 1 refills | Status: DC
Start: 2020-08-03 — End: 2021-02-11

## 2020-08-03 NOTE — Progress Notes (Signed)
[facility] [place], [place] [address]  Internal MEDICINE  Office Visit Note  Patient Name: Cassie Ross  [id number]  [id number]  Date of Service: 08/06/2020  Chief Complaint  Patient presents with  . Follow-up    Had high blood sugar episode on Tuesday night (529).  . Diabetes  . Hypertension  . Gastroesophageal Reflux  . Hyperlipidemia    HPI  Cassie Ross presents for a follow up visit regarding diabetes, hypertension, gastroesophageal reflux, asthma, and hyperlipidemia. She had a recent episode Tuesday night of her glucose level spiking at 529. Patient is finishing a prednisone taper. She reports that any time she starts having to use her duoneb treatments every 4 hours, then she knows her breathing is worsening and she needs to do a prednisone taper.  -she has history of diabetes. Her A1C is elevated by 1.1 since the previous a1c check. It was 8.6 today and was previously 7.5 but patient is taking prednisone which can effect glucose levels. Acid reflux is manageable with pantoprazole.  Blood pressure today 152/83- taking diltiazem and furosemide. Average BP readings at home are around 130/80 per patient.  Lipid panel in march 2022, wnl.  Current Medication: Outpatient Encounter Medications as of 08/03/2020  Medication Sig Note  . acetaminophen (TYLENOL) 500 MG tablet Take 1,000 mg by mouth every 8 (eight) hours as needed for moderate pain.    Marland Kitchen albuterol (PROAIR HFA) 108 (90 Base) MCG/ACT inhaler Inhale 2 puffs into the lungs every 4 (four) hours as needed for wheezing or shortness of breath.   Marland Kitchen albuterol (PROVENTIL) (2.5 MG/3ML) 0.083% nebulizer solution Take 3 mLs (2.5 mg total) by nebulization every 6 (six) hours as needed for shortness of breath. USE 1 VIAL IN NEBULIZER EVERY 6 HOURS AS NEEDED   . ALPRAZolam (XANAX) 0.5 MG tablet Take 1 tablet (0.5 mg total) by mouth 2 (two) times daily as needed for anxiety. (Patient taking differently: Take 0.5 mg  by mouth 2 (two) times daily.)   . aspirin EC 81 MG tablet Take 81 mg by mouth daily.   . Blood Glucose Monitoring Suppl (ONETOUCH VERIO) w/Device KIT Use as directed diag   . carboxymethylcellul-glycerin (LUBRICANT DROPS/DUAL-ACTION) 0.5-0.9 % ophthalmic solution Place 1-2 drops into both eyes 3 (three) times daily as needed for dry eyes.   . fluticasone (FLONASE) 50 MCG/ACT nasal spray Place 1 spray into both nostrils in the morning and at bedtime.   . furosemide (LASIX) 40 MG tablet Take 40 mg by mouth 2 (two) times daily as needed (fluid retention.).   Marland Kitchen glucose blood (ONETOUCH VERIO) test strip Use 1 strip to check glucose 3 times a daily as directed   . loratadine (CLARITIN) 10 MG tablet Take 10 mg by mouth daily.   . Multiple Vitamin (MULTIVITAMIN WITH MINERALS) TABS tablet Take 1 tablet by mouth daily.   . [DISCONTINUED] diltiazem (CARDIZEM CD) 180 MG 24 hr capsule Take 1 capsule (180 mg total) by mouth 2 (two) times daily.   . [DISCONTINUED] insulin aspart (NOVOLOG FLEXPEN) 100 UNIT/ML FlexPen Sliding scale - use as directed QAC per sliding scale instructions. Max daily dose is 25 units in 24 hours. E11.65 (Patient taking differently: Inject 2-10 Units into the skin as directed. Sliding scale - use as directed QAC per sliding scale instructions. Max daily dose is 25 units in 24 hours. E11.65)   . [DISCONTINUED] insulin glargine (LANTUS SOLOSTAR) 100 UNIT/ML Solostar Pen Inject 10 Units into the skin daily. (Patient taking  differently: Inject 10 Units into the skin at bedtime.)   . [DISCONTINUED] ipratropium-albuterol (DUONEB) 0.5-2.5 (3) MG/3ML SOLN USE 1 AMPULE IN NEBULIZER EVERY 4 HOURS AS NEEDED (Patient taking differently: Inhale 3 mLs into the lungs every 4 (four) hours as needed (wheezing/shortness of breath).)   . [DISCONTINUED] montelukast (SINGULAIR) 10 MG tablet Take 1 tablet (10 mg total) by mouth at bedtime.   . [DISCONTINUED] pantoprazole (PROTONIX) 40 MG tablet Take 40 mg by  mouth 2 (two) times daily.  05/04/2020: Awaiting refill authorization  . [DISCONTINUED] potassium chloride (KLOR-CON) 10 MEQ tablet Take 1 tablet (10 mEq total) by mouth 2 (two) times daily.   . [DISCONTINUED] theophylline (THEODUR) 300 MG 12 hr tablet Take 1 tablet (300 mg total) by mouth 2 (two) times daily.   . [DISCONTINUED] tiotropium (SPIRIVA HANDIHALER) 18 MCG inhalation capsule Place 1 capsule (18 mcg total) into inhaler and inhale daily. 05/04/2020: New medication patient has not started  . diltiazem (CARDIZEM CD) 180 MG 24 hr capsule Take 1 capsule (180 mg total) by mouth 2 (two) times daily.   . insulin aspart (NOVOLOG FLEXPEN) 100 UNIT/ML FlexPen Inject 2-10 Units into the skin as directed. Sliding scale - use as directed QAC per sliding scale instructions. Max daily dose is 25 units in 24 hours. E11.65   . insulin glargine (LANTUS SOLOSTAR) 100 UNIT/ML Solostar Pen Inject 10 Units into the skin at bedtime.   Marland Kitchen ipratropium-albuterol (DUONEB) 0.5-2.5 (3) MG/3ML SOLN Inhale 3 mLs into the lungs every 4 (four) hours as needed (wheezing/shortness of breath).   . montelukast (SINGULAIR) 10 MG tablet Take 1 tablet (10 mg total) by mouth at bedtime.   . pantoprazole (PROTONIX) 40 MG tablet Take 1 tablet (40 mg total) by mouth 2 (two) times daily.   . potassium chloride (KLOR-CON) 10 MEQ tablet Take 1 tablet (10 mEq total) by mouth 2 (two) times daily.   . theophylline (THEODUR) 300 MG 12 hr tablet Take 1 tablet (300 mg total) by mouth 2 (two) times daily.   Marland Kitchen tiotropium (SPIRIVA HANDIHALER) 18 MCG inhalation capsule Place 1 capsule (18 mcg total) into inhaler and inhale daily.   . [DISCONTINUED] oxyCODONE-acetaminophen (PERCOCET/ROXICET) 5-325 MG tablet Take 1 tablet by mouth every 4 (four) hours as needed for moderate pain.    No facility-administered encounter medications on file as of 08/03/2020.    Surgical History: Past Surgical History:  Procedure Laterality Date  . BLADDER SUSPENSION  N/A 05/23/2020   Procedure: TRANSVAGINAL TAPE (TVT) PROCEDURE;  Surgeon: Gae Dry, MD;  Location: [facility] ORS;  Service: Gynecology;  Laterality: N/A;  . cataract surgery    . CHOLECYSTECTOMY    . COLONOSCOPY WITH PROPOFOL N/A 01/06/2018   Procedure: COLONOSCOPY WITH PROPOFOL;  Surgeon: Lollie Sails, MD;  Location: [facility] ENDOSCOPY;  Service: Endoscopy;  Laterality: N/A;  . CYSTOCELE REPAIR N/A 05/23/2020   Procedure: ANTERIOR REPAIR (CYSTOCELE);  Surgeon: Gae Dry, MD;  Location: [facility] ORS;  Service: Gynecology;  Laterality: N/A;  . CYSTOSCOPY N/A 05/23/2020   Procedure: CYSTOSCOPY;  Surgeon: Gae Dry, MD;  Location: [facility] ORS;  Service: Gynecology;  Laterality: N/A;  . ESOPHAGEAL DILATION    . ESOPHAGOGASTRODUODENOSCOPY (EGD) WITH PROPOFOL N/A 01/06/2018   Procedure: ESOPHAGOGASTRODUODENOSCOPY (EGD) WITH PROPOFOL;  Surgeon: Lollie Sails, MD;  Location: [facility] ENDOSCOPY;  Service: Endoscopy;  Laterality: N/A;  . EYE SURGERY Bilateral 2014   cataract   . HERNIA REPAIR    . hiatial hernia repair    .  NASAL SINUS SURGERY    . PUBOVAGINAL SLING N/A 05/23/2020   Procedure: Gaynelle Arabian;  Surgeon: Gae Dry, MD;  Location: [facility] ORS;  Service: Gynecology;  Laterality: N/A;  . RECTOCELE REPAIR  05/23/2020   Procedure: POSTERIOR REPAIR (RECTOCELE);  Surgeon: Gae Dry, MD;  Location: [facility] ORS;  Service: Gynecology;;  . VAGINAL HYSTERECTOMY Bilateral 05/23/2020   Procedure: HYSTERECTOMY VAGINAL BILATERIAL SALPINGO OOPHORECTOMY;  Surgeon: Gae Dry, MD;  Location: [facility] ORS;  Service: Gynecology;  Laterality: Bilateral;  LEFT fallopian tube, BILAT ovaries    Medical History: Past Medical History:  Diagnosis Date  . Anemia   . Asthma   . CHF (congestive heart failure) ([place])    1991- unknown after asthma attack  . Chicken pox   . Complication of anesthesia 1998   woke up during procedure   . COPD (chronic obstructive pulmonary disease) ([place])   .  Diabetes ([place])    Type II   . 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 DVT (deep venous thrombosis) ([place])   . GERD (gastroesophageal reflux disease)   . Hernia, hiatal 2007  . HLD (hyperlipidemia)   . Hypertension   . Hypertension   . Pneumonia   . Seasonal allergies     Family History: Family History  Problem Relation Age of Onset  . Hypertension Mother   . Lung cancer Mother   . Colon cancer Father   . Colon polyps Sister   . Diabetes Sister     Social History   Socioeconomic History  . Marital status: Married    Spouse name: Not on file  . Number of children: Not on file  . Years of education: Not on file  . Highest education level: Not on file  Occupational History  . Not on file  Tobacco Use  . Smoking status: Never Smoker  . Smokeless tobacco: Never Used  Vaping Use  . Vaping Use: Never used  Substance and Sexual Activity  . Alcohol use: Yes    Comment: glass of wine rarely  . Drug use: No  . Sexual activity: Not on file  Other Topics Concern  . Not on file  Social History Narrative   Lives at home with family. Independent   Social Determinants of Health   Financial Resource Strain: Not on file  Food Insecurity: Not on file  Transportation Needs: Not on file  Physical Activity: Not on file  Stress: Not on file  Social Connections: Not on file  Intimate Partner Violence: Not on file      Review of Systems  Constitutional: Negative for chills, fatigue and unexpected weight change.  HENT: Negative for congestion, rhinorrhea, sneezing and sore throat.   Eyes: Negative for redness.  Respiratory: Negative for cough, chest tightness and shortness of breath.   Cardiovascular: Negative for chest pain and palpitations.  Gastrointestinal: Negative for abdominal pain, constipation, diarrhea, nausea and vomiting.  Genitourinary: Negative for dysuria and frequency.  Musculoskeletal: Negative for arthralgias, back pain, joint swelling and  neck pain.  Skin: Negative for rash.  Neurological: Negative.  Negative for tremors and numbness.  Hematological: Negative for adenopathy. Does not bruise/bleed easily.  Psychiatric/Behavioral: Negative for behavioral problems (Depression), sleep disturbance and suicidal ideas. The patient is not nervous/anxious.     Vital Signs: BP (!) 152/83   Pulse 75   Temp 98.3 F (36.8 C)   Resp 16   Ht _0  (1.6 m)   Wt  190 lb 12.8 oz (86.5 kg)   SpO2 96%   BMI 33.80 kg/m    Physical Exam Vitals reviewed.  Constitutional:      General: She is not in acute distress.    Appearance: She is well-developed. She is not diaphoretic.  HENT:     Head: Normocephalic and atraumatic.  Neck:     Thyroid: No thyromegaly.     Vascular: No JVD.     Trachea: No tracheal deviation.  Cardiovascular:     Rate and Rhythm: Normal rate and regular rhythm.     Heart sounds: Normal heart sounds. No murmur heard. No friction rub. No gallop.   Pulmonary:     Effort: Pulmonary effort is normal. No respiratory distress.     Breath sounds: No wheezing or rales.  Chest:     Chest wall: No tenderness.  Abdominal:     General: Bowel sounds are normal.     Palpations: Abdomen is soft.  Musculoskeletal:     Cervical back: Normal range of motion and neck supple.  Lymphadenopathy:     Cervical: No cervical adenopathy.  Skin:    General: Skin is warm and dry.  Neurological:     Mental Status: She is alert and oriented to person, place, and time.     Cranial Nerves: No cranial nerve deficit.  Psychiatric:        Mood and Affect: Mood normal.        Behavior: Behavior normal.        Thought Content: Thought content normal.        Judgment: Judgment normal.    Assessment/Plan: 1. Essential hypertension Diltiazem refill ordered. Stable on current medications. - diltiazem (CARDIZEM CD) 180 MG 24 hr capsule; Take 1 capsule (180 mg total) by mouth 2 (two) times daily.  Dispense: 180 capsule; Refill: 1  2.  Uncontrolled type 2 diabetes mellitus with hyperglycemia (HCC) Current A1C is 8.6, patient has been taking prednisone. Will recheck a1c in 3 months, make sure patient is off prednisone first. Refills ordered. - POCT glycosylated hemoglobin (Hb A1C) - POCT Glucose (CBG) - insulin glargine (LANTUS SOLOSTAR) 100 UNIT/ML Solostar Pen; Inject 10 Units into the skin at bedtime.  Dispense: 15 mL; Refill: 1 - insulin aspart (NOVOLOG FLEXPEN) 100 UNIT/ML FlexPen; Inject 2-10 Units into the skin as directed. Sliding scale - use as directed QAC per sliding scale instructions. Max daily dose is 25 units in 24 hours. E11.65  Dispense: 15 mL; Refill: 1  3. Gastroesophageal reflux disease without esophagitis Stable with pantoprazole daily  - pantoprazole (PROTONIX) 40 MG tablet; Take 1 tablet (40 mg total) by mouth 2 (two) times daily.  Dispense: 60 tablet; Refill: 2  5. Mixed hyperlipidemia History of hyperlipidemia. She is not currently on any medications. Will recheck lipid panel at next follow up visit.    6. Severe persistent asthma without complication Stable with current medications, needs new nebulizer machine, order placed.  - theophylline (THEODUR) 300 MG 12 hr tablet; Take 1 tablet (300 mg total) by mouth 2 (two) times daily.  Dispense: 180 tablet; Refill: 1 - montelukast (SINGULAIR) 10 MG tablet; Take 1 tablet (10 mg total) by mouth at bedtime.  Dispense: 90 tablet; Refill: 1 - For home use only DME Nebulizer machine  7. Chronic obstructive pulmonary disease, unspecified COPD type ([place]) Stable with current medication regimen - tiotropium (SPIRIVA HANDIHALER) 18 MCG inhalation capsule; Place 1 capsule (18 mcg total) into inhaler and inhale daily.  Dispense: 30  capsule; Refill: 12 - montelukast (SINGULAIR) 10 MG tablet; Take 1 tablet (10 mg total) by mouth at bedtime.  Dispense: 90 tablet; Refill: 1 - ipratropium-albuterol (DUONEB) 0.5-2.5 (3) MG/3ML SOLN; Inhale 3 mLs into the lungs every 4  (four) hours as needed (wheezing/shortness of breath).  Dispense: 120 mL; Refill: 2  8. Hypokalemia Continue potassium supplement.  - potassium chloride (KLOR-CON) 10 MEQ tablet; Take 1 tablet (10 mEq total) by mouth 2 (two) times daily.  Dispense: 180 tablet; Refill: 5   General Counseling: Cassie Ross verbalizes understanding of the findings of todays visit and agrees with plan of treatment. I have discussed any further diagnostic evaluation that may be needed or ordered today. We also reviewed her medications today. she has been encouraged to call the office with any questions or concerns that should arise related to todays visit.    Orders Placed This Encounter  Procedures  . For home use only DME Nebulizer machine  . POCT glycosylated hemoglobin (Hb A1C)  . POCT Glucose (CBG)    Meds ordered this encounter  Medications  . pantoprazole (PROTONIX) 40 MG tablet    Sig: Take 1 tablet (40 mg total) by mouth 2 (two) times daily.    Dispense:  60 tablet    Refill:  2  . theophylline (THEODUR) 300 MG 12 hr tablet    Sig: Take 1 tablet (300 mg total) by mouth 2 (two) times daily.    Dispense:  180 tablet    Refill:  1    Please consider 90 day supplies to promote better adherence  . tiotropium (SPIRIVA HANDIHALER) 18 MCG inhalation capsule    Sig: Place 1 capsule (18 mcg total) into inhaler and inhale daily.    Dispense:  30 capsule    Refill:  12  . potassium chloride (KLOR-CON) 10 MEQ tablet    Sig: Take 1 tablet (10 mEq total) by mouth 2 (two) times daily.    Dispense:  180 tablet    Refill:  5  . montelukast (SINGULAIR) 10 MG tablet    Sig: Take 1 tablet (10 mg total) by mouth at bedtime.    Dispense:  90 tablet    Refill:  1  . ipratropium-albuterol (DUONEB) 0.5-2.5 (3) MG/3ML SOLN    Sig: Inhale 3 mLs into the lungs every 4 (four) hours as needed (wheezing/shortness of breath).    Dispense:  120 mL    Refill:  2  . insulin glargine (LANTUS SOLOSTAR) 100 UNIT/ML Solostar Pen     Sig: Inject 10 Units into the skin at bedtime.    Dispense:  15 mL    Refill:  1  . insulin aspart (NOVOLOG FLEXPEN) 100 UNIT/ML FlexPen    Sig: Inject 2-10 Units into the skin as directed. Sliding scale - use as directed QAC per sliding scale instructions. Max daily dose is 25 units in 24 hours. E11.65    Dispense:  15 mL    Refill:  1  . diltiazem (CARDIZEM CD) 180 MG 24 hr capsule    Sig: Take 1 capsule (180 mg total) by mouth 2 (two) times daily.    Dispense:  180 capsule    Refill:  1   Return in about 3 months (around 11/03/2020) for F/U, Recheck A1C, Kashaun Bebo PCP, med refill.  Total time spent:30 Minutes Time spent includes review of chart, medications, test results, and follow up plan with the patient.   [place] Controlled Substance Database was reviewed by me.  This patient was  seen by Jonetta Osgood, FNP-C in collaboration with Dr. Clayborn Bigness as a part of collaborative care agreement.  Jonetta Osgood, MSN, FNP-C Internal medicine

## 2020-08-04 ENCOUNTER — Telehealth: Payer: Self-pay

## 2020-08-04 NOTE — Telephone Encounter (Signed)
Left vm asking pt to call back to see what company she uses to get her new nebulizer machine

## 2020-08-10 ENCOUNTER — Telehealth: Payer: Self-pay

## 2020-08-10 NOTE — Telephone Encounter (Signed)
Faxed prescription for nebulizer and supplies to [facility] in [place], [place]

## 2020-08-21 ENCOUNTER — Other Ambulatory Visit: Payer: Self-pay

## 2020-08-22 ENCOUNTER — Telehealth: Payer: Self-pay

## 2020-08-22 DIAGNOSIS — J449 Chronic obstructive pulmonary disease, unspecified: Secondary | ICD-10-CM | POA: Diagnosis not present

## 2020-08-22 NOTE — Telephone Encounter (Signed)
Cassie Ross from [facility] called and asked for me to contact patient and ask pt to call her back at [phone] for case management.  I did call pt and give her the information and also informed pt that I did fax her prescription for the nebulizer to the pharmacy.

## 2020-08-23 ENCOUNTER — Other Ambulatory Visit: Payer: Self-pay

## 2020-08-23 DIAGNOSIS — J449 Chronic obstructive pulmonary disease, unspecified: Secondary | ICD-10-CM

## 2020-08-23 MED ORDER — IPRATROPIUM-ALBUTEROL 0.5-2.5 (3) MG/3ML IN SOLN: 3.0000 mL | RESPIRATORY_TRACT | 3 refills | Status: DC | PRN

## 2020-08-25 ENCOUNTER — Ambulatory Visit: Payer: HMO | Admitting: Nurse Practitioner

## 2020-08-29 ENCOUNTER — Telehealth: Payer: Self-pay

## 2020-08-29 NOTE — Telephone Encounter (Signed)
Faxed office notes and prescription for nebulizer to [facility] [phone]

## 2020-08-31 ENCOUNTER — Telehealth: Payer: Self-pay

## 2020-08-31 NOTE — Telephone Encounter (Signed)
Patients Ipatropium-Albuterol Solution has received a paid claim at their pharmacy under Part B.

## 2020-09-04 ENCOUNTER — Other Ambulatory Visit: Payer: Self-pay | Admitting: Internal Medicine

## 2020-09-04 ENCOUNTER — Telehealth: Payer: Self-pay

## 2020-09-04 DIAGNOSIS — J45909 Unspecified asthma, uncomplicated: Secondary | ICD-10-CM

## 2020-09-04 MED ORDER — UNIFINE PEN NEEDLES 32G X 4 MM MISC: 3 refills | Status: DC

## 2020-09-04 NOTE — Telephone Encounter (Signed)
LMOM to confirm pt has gotten her Ipratropium-Albuterol, and that it was covered by insurance

## 2020-09-04 NOTE — Telephone Encounter (Signed)
Please check.

## 2020-09-04 NOTE — Telephone Encounter (Signed)
Pt is aware duoneb is covered by insurance and are ready for pick up, pt also asked for insulin pen needles to be sent to pharmacy which was done.

## 2020-09-05 ENCOUNTER — Other Ambulatory Visit: Payer: Self-pay | Admitting: Nurse Practitioner

## 2020-09-05 ENCOUNTER — Other Ambulatory Visit: Payer: Self-pay | Admitting: Internal Medicine

## 2020-09-05 DIAGNOSIS — F411 Generalized anxiety disorder: Secondary | ICD-10-CM

## 2020-09-07 ENCOUNTER — Other Ambulatory Visit: Payer: Self-pay

## 2020-09-07 ENCOUNTER — Telehealth: Payer: Self-pay

## 2020-09-07 ENCOUNTER — Other Ambulatory Visit: Payer: Self-pay | Admitting: Nurse Practitioner

## 2020-09-07 DIAGNOSIS — F411 Generalized anxiety disorder: Secondary | ICD-10-CM

## 2020-09-07 MED ORDER — ALPRAZOLAM 0.5 MG PO TABS: 0.5000 mg | ORAL_TABLET | Freq: Two times a day (BID) | ORAL | 1 refills | Status: DC | PRN

## 2020-09-08 NOTE — Telephone Encounter (Signed)
LMOM that  we send her medicine

## 2020-09-20 ENCOUNTER — Other Ambulatory Visit: Payer: Self-pay

## 2020-09-20 DIAGNOSIS — E876 Hypokalemia: Secondary | ICD-10-CM

## 2020-09-20 MED ORDER — POTASSIUM CHLORIDE CRYS ER 10 MEQ PO TBCR: 10.0000 meq | EXTENDED_RELEASE_TABLET | Freq: Two times a day (BID) | ORAL | 5 refills | Status: DC

## 2020-09-21 DIAGNOSIS — J449 Chronic obstructive pulmonary disease, unspecified: Secondary | ICD-10-CM | POA: Diagnosis not present

## 2020-10-04 ENCOUNTER — Other Ambulatory Visit: Payer: Self-pay | Admitting: Internal Medicine

## 2020-10-04 DIAGNOSIS — J45909 Unspecified asthma, uncomplicated: Secondary | ICD-10-CM

## 2020-10-12 ENCOUNTER — Ambulatory Visit (INDEPENDENT_AMBULATORY_CARE_PROVIDER_SITE_OTHER): Payer: HMO | Admitting: Nurse Practitioner

## 2020-10-12 ENCOUNTER — Other Ambulatory Visit: Payer: Self-pay

## 2020-10-12 ENCOUNTER — Encounter: Payer: Self-pay | Admitting: Nurse Practitioner

## 2020-10-12 VITALS — BP 134/90 | HR 97 | Temp 98.1°F | Resp 16 | Ht 63.0 in | Wt 192.6 lb

## 2020-10-12 DIAGNOSIS — F411 Generalized anxiety disorder: Secondary | ICD-10-CM | POA: Diagnosis not present

## 2020-10-12 DIAGNOSIS — E559 Vitamin D deficiency, unspecified: Secondary | ICD-10-CM

## 2020-10-12 DIAGNOSIS — J4550 Severe persistent asthma, uncomplicated: Secondary | ICD-10-CM

## 2020-10-12 DIAGNOSIS — Z0189 Encounter for other specified special examinations: Secondary | ICD-10-CM

## 2020-10-12 DIAGNOSIS — E1165 Type 2 diabetes mellitus with hyperglycemia: Secondary | ICD-10-CM

## 2020-10-12 DIAGNOSIS — I10 Essential (primary) hypertension: Secondary | ICD-10-CM | POA: Diagnosis not present

## 2020-10-12 DIAGNOSIS — E782 Mixed hyperlipidemia: Secondary | ICD-10-CM | POA: Diagnosis not present

## 2020-10-12 DIAGNOSIS — K219 Gastro-esophageal reflux disease without esophagitis: Secondary | ICD-10-CM | POA: Diagnosis not present

## 2020-10-12 DIAGNOSIS — Z862 Personal history of diseases of the blood and blood-forming organs and certain disorders involving the immune mechanism: Secondary | ICD-10-CM

## 2020-10-12 DIAGNOSIS — R0602 Shortness of breath: Secondary | ICD-10-CM

## 2020-10-12 DIAGNOSIS — R300 Dysuria: Secondary | ICD-10-CM

## 2020-10-12 DIAGNOSIS — Z23 Encounter for immunization: Secondary | ICD-10-CM | POA: Diagnosis not present

## 2020-10-12 DIAGNOSIS — Z0001 Encounter for general adult medical examination with abnormal findings: Secondary | ICD-10-CM | POA: Diagnosis not present

## 2020-10-12 MED ORDER — ALPRAZOLAM 0.5 MG PO TABS: 0.5000 mg | ORAL_TABLET | Freq: Two times a day (BID) | ORAL | 1 refills | Status: DC | PRN

## 2020-10-12 MED ORDER — PREVNAR 20 0.5 ML IM SUSY: 0.5000 mL | PREFILLED_SYRINGE | Freq: Once | INTRAMUSCULAR | 0 refills | Status: AC

## 2020-10-12 MED ORDER — FUROSEMIDE 40 MG PO TABS: 40.0000 mg | ORAL_TABLET | Freq: Two times a day (BID) | ORAL | 2 refills | Status: AC | PRN

## 2020-10-12 MED ORDER — BREZTRI AEROSPHERE 160-9-4.8 MCG/ACT IN AERO: 2.0000 | INHALATION_SPRAY | Freq: Two times a day (BID) | RESPIRATORY_TRACT | 11 refills | Status: DC

## 2020-10-12 NOTE — Progress Notes (Signed)
[facility] [place], [place] [address]  Internal MEDICINE  Office Visit Note  Patient Name: Cassie Ross  [id number]  [id number]  Date of Service: 10/12/2020  Chief Complaint  Patient presents with   Medicare Wellness    HPI Maliea presents for an annual well visit and physical exam. she has a history of  diabetes, hypertension, gastroesophageal reflux, asthma, CHF, COPD, anemia, and hyperlipidemia. She has had trouble controlling her asthma/COPD and is on prednisone taper quite frequently, her last one documented was in may 2022. Today, she reports her breathing is ok. She reports that the breztri inhaler helps. She does still have her nebulizer treatments and rescue inhaler that she uses as needed. She also takes theophylline.  For her glucose levels, she is currently taking lantus basal insulin and sliding scale Novolog insulin. Last year, she was on farxiga but that was discontinued in December 2021. She is not currently on any oral diabetic medications. She is due to have her A1C checked in 1 month. Depending on the level, her medications may be adjusted. She has her diabetic eye exam scheduled for later this year with ophthalmology.  Her blood pressure is well controlled with current medications, diltiazem and lasix. She would like the pneumonia vaccine.   ORS 320   Current Medication: Outpatient Encounter Medications as of 10/12/2020  Medication Sig   acetaminophen (TYLENOL) 500 MG tablet Take 1,000 mg by mouth every 8 (eight) hours as needed for moderate pain.    albuterol (PROVENTIL) (2.5 MG/3ML) 0.083% nebulizer solution Take 3 mLs (2.5 mg total) by nebulization every 6 (six) hours as needed for shortness of breath. USE 1 VIAL IN NEBULIZER EVERY 6 HOURS AS NEEDED   albuterol (VENTOLIN HFA) 108 (90 Base) MCG/ACT inhaler INHALE 2 PUFFS BY MOUTH EVERY 4 HOURS AS NEEDED FOR WHEEZING AND FOR SHORTNESS OF BREATH   aspirin EC 81 MG tablet Take 81 mg by  mouth daily.   Blood Glucose Monitoring Suppl (ONETOUCH VERIO) w/Device KIT Use as directed diag   Budeson-Glycopyrrol-Formoterol (BREZTRI AEROSPHERE) 160-9-4.8 MCG/ACT AERO Inhale 2 puffs into the lungs 2 (two) times daily.   carboxymethylcellul-glycerin (LUBRICANT DROPS/DUAL-ACTION) 0.5-0.9 % ophthalmic solution Place 1-2 drops into both eyes 3 (three) times daily as needed for dry eyes.   diltiazem (CARDIZEM CD) 180 MG 24 hr capsule Take 1 capsule (180 mg total) by mouth 2 (two) times daily.   fluticasone (FLONASE) 50 MCG/ACT nasal spray Place 1 spray into both nostrils in the morning and at bedtime.   glucose blood (ONETOUCH VERIO) test strip Use 1 strip to check glucose 3 times a daily as directed   insulin aspart (NOVOLOG FLEXPEN) 100 UNIT/ML FlexPen Inject 2-10 Units into the skin as directed. Sliding scale - use as directed QAC per sliding scale instructions. Max daily dose is 25 units in 24 hours. E11.65   insulin glargine (LANTUS SOLOSTAR) 100 UNIT/ML Solostar Pen Inject 10 Units into the skin at bedtime.   ipratropium-albuterol (DUONEB) 0.5-2.5 (3) MG/3ML SOLN Inhale 3 mLs into the lungs every 4 (four) hours as needed (wheezing/shortness of breath). 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   montelukast (SINGULAIR) 10 MG tablet Take 1 tablet (10 mg total) by mouth at bedtime.   Multiple Vitamin (MULTIVITAMIN WITH MINERALS) TABS tablet Take 1 tablet by mouth daily.   pantoprazole (PROTONIX) 40 MG  tablet Take 1 tablet (40 mg total) by mouth 2 (two) times daily.   [EXPIRED] pneumococcal 20-Val Conj Vacc (PREVNAR 20) 0.5 ML injection Inject 0.5 mLs into the muscle once for 1 dose.   potassium chloride (KLOR-CON) 10 MEQ tablet Take 1 tablet (10 mEq total) by mouth 2 (two) times daily.   theophylline (THEODUR) 300 MG 12 hr tablet Take 1 tablet (300 mg total) by mouth 2 (two) times daily.    [DISCONTINUED] ALPRAZolam (XANAX) 0.5 MG tablet Take 1 tablet (0.5 mg total) by mouth 2 (two) times daily as needed for anxiety.   [DISCONTINUED] furosemide (LASIX) 40 MG tablet Take 40 mg by mouth 2 (two) times daily as needed (fluid retention.).   [DISCONTINUED] tiotropium (SPIRIVA HANDIHALER) 18 MCG inhalation capsule Place 1 capsule (18 mcg total) into inhaler and inhale daily.   ALPRAZolam (XANAX) 0.5 MG tablet Take 1 tablet (0.5 mg total) by mouth 2 (two) times daily as needed for anxiety.   furosemide (LASIX) 40 MG tablet Take 1 tablet (40 mg total) by mouth 2 (two) times daily as needed (fluid retention.).   No facility-administered encounter medications on file as of 10/12/2020.    Surgical History: Past Surgical History:  Procedure Laterality Date   BLADDER SUSPENSION N/A 05/23/2020   Procedure: TRANSVAGINAL TAPE (TVT) PROCEDURE;  Surgeon: Gae Dry, MD;  Location: [facility] ORS;  Service: Gynecology;  Laterality: N/A;   cataract surgery     CHOLECYSTECTOMY     COLONOSCOPY WITH PROPOFOL N/A 01/06/2018   Procedure: COLONOSCOPY WITH PROPOFOL;  Surgeon: Lollie Sails, MD;  Location: [facility] ENDOSCOPY;  Service: Endoscopy;  Laterality: N/A;   CYSTOCELE REPAIR N/A 05/23/2020   Procedure: ANTERIOR REPAIR (CYSTOCELE);  Surgeon: Gae Dry, MD;  Location: [facility] ORS;  Service: Gynecology;  Laterality: N/A;   CYSTOSCOPY N/A 05/23/2020   Procedure: CYSTOSCOPY;  Surgeon: Gae Dry, MD;  Location: [facility] ORS;  Service: Gynecology;  Laterality: N/A;   ESOPHAGEAL DILATION     ESOPHAGOGASTRODUODENOSCOPY (EGD) WITH PROPOFOL N/A 01/06/2018   Procedure: ESOPHAGOGASTRODUODENOSCOPY (EGD) WITH PROPOFOL;  Surgeon: Lollie Sails, MD;  Location: [facility] ENDOSCOPY;  Service: Endoscopy;  Laterality: N/A;   EYE SURGERY Bilateral 2014   cataract    HERNIA REPAIR     hiatial hernia repair     NASAL SINUS SURGERY     PUBOVAGINAL SLING N/A 05/23/2020   Procedure: Gaynelle Arabian;  Surgeon:  Gae Dry, MD;  Location: [facility] ORS;  Service: Gynecology;  Laterality: N/A;   RECTOCELE REPAIR  05/23/2020   Procedure: POSTERIOR REPAIR (RECTOCELE);  Surgeon: Gae Dry, MD;  Location: [facility] ORS;  Service: Gynecology;;   VAGINAL HYSTERECTOMY Bilateral 05/23/2020   Procedure: HYSTERECTOMY VAGINAL BILATERIAL SALPINGO OOPHORECTOMY;  Surgeon: Gae Dry, MD;  Location: [facility] ORS;  Service: Gynecology;  Laterality: Bilateral;  LEFT fallopian tube, BILAT ovaries    Medical History: Past Medical History:  Diagnosis Date   Anemia    Asthma    CHF (congestive heart failure) ([place])    1991- unknown after asthma attack   Chicken pox    Complication of anesthesia 1998   woke up during procedure    COPD (chronic obstructive pulmonary disease) ([place])    Diabetes ([place])    Type II    Difficult 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    DVT (deep venous thrombosis) ([facility])    GERD (gastroesophageal reflux disease)    Hernia, hiatal 2007   HLD (hyperlipidemia)  Hypertension    Hypertension    Pneumonia    Seasonal allergies     Family History: Family History  Problem Relation Age of Onset   Hypertension Mother    Lung cancer Mother    Colon cancer Father    Colon polyps Sister    Diabetes Sister     Social History   Socioeconomic History   Marital status: Married    Spouse name: Not on file   Number of children: Not on file   Years of education: Not on file   Highest education level: Not on file  Occupational History   Not on file  Tobacco Use   Smoking status: Never   Smokeless tobacco: Never  Vaping Use   Vaping Use: Never used  Substance and Sexual Activity   Alcohol use: Yes    Comment: glass of wine rarely   Drug use: No   Sexual activity: Not on file  Other Topics Concern   Not on file  Social History Narrative   Lives at home with family. Independent   Social Determinants of Health   Financial Resource Strain: Not on file  Food  Insecurity: Not on file  Transportation Needs: Not on file  Physical Activity: Not on file  Stress: Not on file  Social Connections: Not on file  Intimate Partner Violence: Not on file      Review of Systems  Constitutional:  Negative for activity change, appetite change, chills, fatigue, fever and unexpected weight change.  HENT: Negative.  Negative for congestion, ear pain, rhinorrhea, sore throat and trouble swallowing.   Eyes: Negative.   Respiratory:  Positive for shortness of breath (intermittent) and wheezing (intermittent). Negative for cough and chest tightness.   Cardiovascular: Negative.  Negative for chest pain.  Gastrointestinal: Negative.  Negative for abdominal pain, blood in stool, constipation, diarrhea, nausea and vomiting.  Endocrine: Negative.   Genitourinary: Negative.  Negative for difficulty urinating, dysuria, frequency, hematuria and urgency.  Musculoskeletal: Negative.  Negative for arthralgias, back pain, joint swelling, myalgias and neck pain.  Skin: Negative.  Negative for rash and wound.  Allergic/Immunologic: Negative.  Negative for immunocompromised state.  Neurological: Negative.  Negative for dizziness, seizures, numbness and headaches.  Hematological: Negative.   Psychiatric/Behavioral:  Positive for sleep disturbance. Negative for behavioral problems, self-injury and suicidal ideas. The patient is nervous/anxious.    Vital Signs: BP 134/90   Pulse 97   Temp 98.1 F (36.7 C)   Resp 16   Ht 5' 3"  (1.6 m)   Wt 192 lb 9.6 oz (87.4 kg)   SpO2 97%   BMI 34.12 kg/m    Physical Exam Vitals reviewed.  Constitutional:      General: She is awake. She is not in acute distress.    Appearance: Normal appearance. She is well-developed and well-groomed. She is obese. She is not ill-appearing.  HENT:     Head: Normocephalic and atraumatic.     Right Ear: Tympanic membrane, ear canal and external ear normal. There is no impacted cerumen.     Left Ear:  Tympanic membrane, ear canal and external ear normal. There is no impacted cerumen.     Nose: Nose normal. No congestion or rhinorrhea.     Mouth/Throat:     Mouth: Mucous membranes are moist.     Pharynx: No oropharyngeal exudate or posterior oropharyngeal erythema.  Eyes:     General: Lids are normal. Vision grossly intact. Gaze aligned appropriately.     Extraocular  Movements: Extraocular movements intact.     Conjunctiva/sclera: Conjunctivae normal.     Pupils: Pupils are equal, round, and reactive to light.     Funduscopic exam:    Right eye: Red reflex present.        Left eye: Red reflex present. Neck:     Vascular: No carotid bruit.     Trachea: Trachea and phonation normal.  Cardiovascular:     Rate and Rhythm: Normal rate and regular rhythm.     Pulses:          Dorsalis pedis pulses are 3+ on the right side and 3+ on the left side.       Posterior tibial pulses are 2+ on the right side and 2+ on the left side.     Heart sounds: Normal heart sounds, S1 normal and S2 normal. No murmur heard.   No friction rub. No gallop.  Pulmonary:     Effort: Pulmonary effort is normal. No accessory muscle usage or respiratory distress.     Breath sounds: Normal air entry. Examination of the right-upper field reveals wheezing. Examination of the left-upper field reveals wheezing. Examination of the right-middle field reveals wheezing. Examination of the left-middle field reveals wheezing. Wheezing present.  Abdominal:     General: Bowel sounds are normal.     Palpations: Abdomen is soft.  Musculoskeletal:        General: No signs of injury. Normal range of motion.     Cervical back: Normal range of motion and neck supple.     Right foot: Bunion present. No foot drop or prominent metatarsal heads.     Left foot: Bunion present. No foot drop or prominent metatarsal heads.  Feet:     Right foot:     Protective Sensation: 6 sites tested.  6 sites sensed.     Skin integrity: Erythema and  callus present. No ulcer, blister, skin breakdown, dry skin or fissure.     Toenail Condition: Right toenails are abnormally thick.     Left foot:     Protective Sensation: 6 sites tested.  6 sites sensed.     Skin integrity: Erythema and callus present. No ulcer, blister, skin breakdown, dry skin or fissure.     Toenail Condition: Left toenails are abnormally thick.  Lymphadenopathy:     Cervical: No cervical adenopathy.  Skin:    General: Skin is warm and dry.     Capillary Refill: Capillary refill takes less than 2 seconds.  Neurological:     Mental Status: She is alert and oriented to person, place, and time.  Psychiatric:        Mood and Affect: Mood normal.        Behavior: Behavior normal. Behavior is cooperative.        Thought Content: Thought content normal.        Judgment: Judgment normal.     Assessment/Plan: 1. Encounter for general adult medical examination with abnormal findings Age-appropriate preventive screenings and vaccinations discussed, annual physical exam completed. Routine labs for health maintenance ordered, see belo. PHM updated.    2. Severe persistent asthma without complication She has asthma with COPD, Judithann Sauger is helping, refill ordered. - Budeson-Glycopyrrol-Formoterol (BREZTRI AEROSPHERE) 160-9-4.8 MCG/ACT AERO; Inhale 2 puffs into the lungs 2 (two) times daily.  Dispense: 10.7 g; Refill: 11  3. Uncontrolled type 2 diabetes mellitus with hyperglycemia ([place]) Patient reports compliance with Lantus and novolog insulin. Labs ordered to monitor kidneys, thyroid and electrolytes, routine labs -  CMP14+EGFR - TSH + free T4 - POCT UA - Microalbumin  4. Essential hypertension Stable with current medications, refill ordered.  - furosemide (LASIX) 40 MG tablet; Take 1 tablet (40 mg total) by mouth 2 (two) times daily as needed (fluid retention.).  Dispense: 30 tablet; Refill: 2  5. Generalized anxiety disorder Stable, refill for alprazolam requested and  ordered. [place] Controlled Substance Database was reviewed by me for overdose risk score (ORS). ORS is 310. - ALPRAZolam (XANAX) 0.5 MG tablet; Take 1 tablet (0.5 mg total) by mouth 2 (two) times daily as needed for anxiety.  Dispense: 60 tablet; Refill: 1  6. Gastroesophageal reflux disease without esophagitis Stable with current medication  7. Mixed hyperlipidemia Not on any medication, recheck lipid panel to monitor.  - Lipid Profile  8. Vitamin D deficiency Rule out low vitamin D level - Vitamin D (25 hydroxy)  9. History of anemia Recheck CBC  - CBC with Differential/Platelet  10. Need for pneumococcal vaccination Order sent to pharmacy - pneumococcal 20-Val Conj Vacc (PREVNAR 20) 0.5 ML injection; Inject 0.5 mLs into the muscle once for 1 dose.  Dispense: 0.5 mL; Refill: 0  11. Dysuria Routine urinalysis done - UA/M w/rflx Culture, Routine - Microscopic Examination - Urine Culture, Reflex      General Counseling: Anderia verbalizes understanding of the findings of todays visit and agrees with plan of treatment. I have discussed any further diagnostic evaluation that may be needed or ordered today. We also reviewed her medications today. she has been encouraged to call the office with any questions or concerns that should arise related to todays visit.    Orders Placed This Encounter  Procedures   Microscopic Examination   Urine Culture, Reflex   UA/M w/rflx Culture, Routine   CBC with Differential/Platelet   CMP14+EGFR   Lipid Profile   TSH + free T4   Vitamin D (25 hydroxy)   POCT UA - Microalbumin    Meds ordered this encounter  Medications   pneumococcal 20-Val Conj Vacc (PREVNAR 20) 0.5 ML injection    Sig: Inject 0.5 mLs into the muscle once for 1 dose.    Dispense:  0.5 mL    Refill:  0   ALPRAZolam (XANAX) 0.5 MG tablet    Sig: Take 1 tablet (0.5 mg total) by mouth 2 (two) times daily as needed for anxiety.    Dispense:  60 tablet    Refill:  1    Budeson-Glycopyrrol-Formoterol (BREZTRI AEROSPHERE) 160-9-4.8 MCG/ACT AERO    Sig: Inhale 2 puffs into the lungs 2 (two) times daily.    Dispense:  10.7 g    Refill:  11   furosemide (LASIX) 40 MG tablet    Sig: Take 1 tablet (40 mg total) by mouth 2 (two) times daily as needed (fluid retention.).    Dispense:  30 tablet    Refill:  2    Return in about 4 weeks (around 11/09/2020) for F/U, Recheck A1C, Review labs/test, Glenford Garis PCP.   Total time spent:30 Minutes Time spent includes review of chart, medications, test results, and follow up plan with the patient.   [place] Controlled Substance Database was reviewed by me.  This patient was seen by Jonetta Osgood, FNP-C in collaboration with Dr. Clayborn Bigness as a part of collaborative care agreement.  Yumi Insalaco R. Valetta Fuller, MSN, FNP-C Internal medicine

## 2020-10-13 LAB — POCT UA - MICROALBUMIN
Albumin/Creatinine Ratio, Urine, POC: 80
Creatinine, POC: 200 mg/dL

## 2020-10-15 LAB — UA/M W/RFLX CULTURE, ROUTINE
Bilirubin, UA: NEGATIVE
Ketones, UA: NEGATIVE
Nitrite, UA: NEGATIVE
Specific Gravity, UA: 1.021 (ref 1.005–1.030)
Urobilinogen, Ur: 0.2 mg/dL (ref 0.2–1.0)
pH, UA: 5.5 (ref 5.0–7.5)

## 2020-10-15 LAB — MICROSCOPIC EXAMINATION
Casts: NONE SEEN /lpf
Epithelial Cells (non renal): >10 /hpf — AB (ref 0–10)
WBC, UA: >30 /hpf — AB (ref 0–5)

## 2020-10-15 LAB — URINE CULTURE, REFLEX

## 2020-10-22 DIAGNOSIS — J449 Chronic obstructive pulmonary disease, unspecified: Secondary | ICD-10-CM | POA: Diagnosis not present

## 2020-11-01 DIAGNOSIS — E559 Vitamin D deficiency, unspecified: Secondary | ICD-10-CM | POA: Diagnosis not present

## 2020-11-01 DIAGNOSIS — E782 Mixed hyperlipidemia: Secondary | ICD-10-CM | POA: Diagnosis not present

## 2020-11-01 DIAGNOSIS — E1165 Type 2 diabetes mellitus with hyperglycemia: Secondary | ICD-10-CM | POA: Diagnosis not present

## 2020-11-01 DIAGNOSIS — Z862 Personal history of diseases of the blood and blood-forming organs and certain disorders involving the immune mechanism: Secondary | ICD-10-CM | POA: Diagnosis not present

## 2020-11-02 LAB — CBC WITH DIFFERENTIAL/PLATELET
Basophils Absolute: 0.1 10*3/uL (ref 0.0–0.2)
Basos: 1 %
EOS (ABSOLUTE): 0.6 10*3/uL — ABNORMAL HIGH (ref 0.0–0.4)
Eos: 7 %
Hematocrit: 40.1 % (ref 34.0–46.6)
Hemoglobin: 13.8 g/dL (ref 11.1–15.9)
Immature Grans (Abs): 0.1 10*3/uL (ref 0.0–0.1)
Immature Granulocytes: 1 %
Lymphocytes Absolute: 1.9 10*3/uL (ref 0.7–3.1)
Lymphs: 26 %
MCH: 30.4 pg (ref 26.6–33.0)
MCHC: 34.4 g/dL (ref 31.5–35.7)
MCV: 88 fL (ref 79–97)
Monocytes Absolute: 0.8 10*3/uL (ref 0.1–0.9)
Monocytes: 10 %
Neutrophils Absolute: 4.2 10*3/uL (ref 1.4–7.0)
Neutrophils: 55 %
Platelets: 327 10*3/uL (ref 150–450)
RBC: 4.54 x10E6/uL (ref 3.77–5.28)
RDW: 14 % (ref 11.7–15.4)
WBC: 7.5 10*3/uL (ref 3.4–10.8)

## 2020-11-02 LAB — CMP14+EGFR
ALT: 12 IU/L (ref 0–32)
AST: 14 IU/L (ref 0–40)
Albumin/Globulin Ratio: 2.2 (ref 1.2–2.2)
Albumin: 4.3 g/dL (ref 3.8–4.8)
Alkaline Phosphatase: 151 IU/L — ABNORMAL HIGH (ref 44–121)
BUN/Creatinine Ratio: 18 (ref 12–28)
BUN: 12 mg/dL (ref 8–27)
Bilirubin Total: 0.4 mg/dL (ref 0.0–1.2)
CO2: 25 mmol/L (ref 20–29)
Calcium: 9.8 mg/dL (ref 8.7–10.3)
Chloride: 101 mmol/L (ref 96–106)
Creatinine, Ser: 0.68 mg/dL (ref 0.57–1.00)
Globulin, Total: 2 g/dL (ref 1.5–4.5)
Glucose: 116 mg/dL — ABNORMAL HIGH (ref 65–99)
Potassium: 4.6 mmol/L (ref 3.5–5.2)
Sodium: 139 mmol/L (ref 134–144)
Total Protein: 6.3 g/dL (ref 6.0–8.5)
eGFR: 99 mL/min/{1.73_m2} (ref >59)

## 2020-11-02 LAB — LIPID PANEL
Chol/HDL Ratio: 2.6 ratio (ref 0.0–4.4)
Cholesterol, Total: 194 mg/dL (ref 100–199)
HDL: 75 mg/dL (ref >39)
LDL Chol Calc (NIH): 101 mg/dL — ABNORMAL HIGH (ref 0–99)
Triglycerides: 100 mg/dL (ref 0–149)
VLDL Cholesterol Cal: 18 mg/dL (ref 5–40)

## 2020-11-02 LAB — VITAMIN D 25 HYDROXY (VIT D DEFICIENCY, FRACTURES): Vit D, 25-Hydroxy: 35.8 ng/mL (ref 30.0–100.0)

## 2020-11-02 LAB — TSH+FREE T4
Free T4: 1.24 ng/dL (ref 0.82–1.77)
TSH: 2.41 u[IU]/mL (ref 0.450–4.500)

## 2020-11-03 ENCOUNTER — Ambulatory Visit: Payer: HMO | Admitting: Nurse Practitioner

## 2020-11-10 ENCOUNTER — Ambulatory Visit: Payer: HMO | Admitting: Nurse Practitioner

## 2020-11-21 ENCOUNTER — Other Ambulatory Visit: Payer: Self-pay

## 2020-11-21 ENCOUNTER — Encounter: Payer: Self-pay | Admitting: Nurse Practitioner

## 2020-11-21 ENCOUNTER — Ambulatory Visit (INDEPENDENT_AMBULATORY_CARE_PROVIDER_SITE_OTHER): Payer: HMO | Admitting: Nurse Practitioner

## 2020-11-21 VITALS — BP 140/80 | HR 75 | Temp 98.5°F | Resp 16 | Ht 63.0 in | Wt 196.0 lb

## 2020-11-21 DIAGNOSIS — I10 Essential (primary) hypertension: Secondary | ICD-10-CM | POA: Diagnosis not present

## 2020-11-21 DIAGNOSIS — E6609 Other obesity due to excess calories: Secondary | ICD-10-CM

## 2020-11-21 DIAGNOSIS — J4550 Severe persistent asthma, uncomplicated: Secondary | ICD-10-CM | POA: Diagnosis not present

## 2020-11-21 DIAGNOSIS — Z6834 Body mass index (BMI) 34.0-34.9, adult: Secondary | ICD-10-CM

## 2020-11-21 DIAGNOSIS — J9601 Acute respiratory failure with hypoxia: Secondary | ICD-10-CM | POA: Diagnosis not present

## 2020-11-21 DIAGNOSIS — E782 Mixed hyperlipidemia: Secondary | ICD-10-CM | POA: Diagnosis not present

## 2020-11-21 DIAGNOSIS — E1165 Type 2 diabetes mellitus with hyperglycemia: Secondary | ICD-10-CM | POA: Diagnosis not present

## 2020-11-21 LAB — POCT GLYCOSYLATED HEMOGLOBIN (HGB A1C): Hemoglobin A1C: 7.3 % — AB (ref 4.0–5.6)

## 2020-11-21 NOTE — Progress Notes (Signed)
[facility] [place], [place] [address]  Internal MEDICINE  Office Visit Note  Patient Name: Cassie Ross  [id number]  [id number]  Date of Service: 11/21/2020  Chief Complaint  Patient presents with   Follow-up    labs   Hypertension   Hyperlipidemia   Diabetes   Quality Metric Gaps    Will get pneumovax and flu shot from pharmacy     HPI Cassie Ross presents for a follow up visit for hypertension, diabetes and to discuss lab results. Cassie Ross has severe persistent asthma and COPD. She was started on Breztri at her previous office visit. Since she started [employer], she has not needed her nebulizer and has only used her rescue inhaler once. This is a significant improvement from before.  Her A1C is 7.3 today which is a signficant improvement from may when it was 8.6. Her A1C has decreased by 1.3. She has not had a prednisone taper recently. Prior to her A1C in may 2022 she had been on prednisone.  -discussed recent labs with patient. Lab results were grossly normal, lipid panel had only 1 abnormal, LDL was 101. -- discussed taking supplement. Patient has a shellfish allergy but states that she can take cod liver oil supplement which she is already taking. She is not taking any medications for cholesterol and is not on statin therapy.  Blood pressure is well controlled.   Current Medication: Outpatient Encounter Medications as of 11/21/2020  Medication Sig   acetaminophen (TYLENOL) 500 MG tablet Take 1,000 mg by mouth every 8 (eight) hours as needed for moderate pain.    albuterol (PROVENTIL) (2.5 MG/3ML) 0.083% nebulizer solution Take 3 mLs (2.5 mg total) by nebulization every 6 (six) hours as needed for shortness of breath. USE 1 VIAL IN NEBULIZER EVERY 6 HOURS AS NEEDED   albuterol (VENTOLIN HFA) 108 (90 Base) MCG/ACT inhaler INHALE 2 PUFFS BY MOUTH EVERY 4 HOURS AS NEEDED FOR WHEEZING AND FOR SHORTNESS OF BREATH   ALPRAZolam (XANAX) 0.5 MG tablet Take 1 tablet  (0.5 mg total) by mouth 2 (two) times daily as needed for anxiety.   aspirin EC 81 MG tablet Take 81 mg by mouth daily.   Blood Glucose Monitoring Suppl (ONETOUCH VERIO) w/Device KIT Use as directed diag   Budeson-Glycopyrrol-Formoterol (BREZTRI AEROSPHERE) 160-9-4.8 MCG/ACT AERO Inhale 2 puffs into the lungs 2 (two) times daily.   carboxymethylcellul-glycerin (LUBRICANT DROPS/DUAL-ACTION) 0.5-0.9 % ophthalmic solution Place 1-2 drops into both eyes 3 (three) times daily as needed for dry eyes.   diltiazem (CARDIZEM CD) 180 MG 24 hr capsule Take 1 capsule (180 mg total) by mouth 2 (two) times daily.   fluticasone (FLONASE) 50 MCG/ACT nasal spray Place 1 spray into both nostrils in the morning and at bedtime.   furosemide (LASIX) 40 MG tablet Take 1 tablet (40 mg total) by mouth 2 (two) times daily as needed (fluid retention.).   glucose blood (ONETOUCH VERIO) test strip Use 1 strip to check glucose 3 times a daily as directed   insulin aspart (NOVOLOG FLEXPEN) 100 UNIT/ML FlexPen Inject 2-10 Units into the skin as directed. Sliding scale - use as directed QAC per sliding scale instructions. Max daily dose is 25 units in 24 hours. E11.65   insulin glargine (LANTUS SOLOSTAR) 100 UNIT/ML Solostar Pen Inject 10 Units into the skin at bedtime.   ipratropium-albuterol (DUONEB) 0.5-2.5 (3) MG/3ML SOLN Inhale 3 mLs into the lungs every 4 (four) hours as needed (wheezing/shortness of breath). J44.0   LEADER  UNIFINE PENTIPS 32G X 4 MM MISC USE AS DIRECTED WITH INSULIN   loratadine (CLARITIN) 10 MG tablet Take 10 mg by mouth daily.   montelukast (SINGULAIR) 10 MG tablet Take 1 tablet (10 mg total) by mouth at bedtime.   Multiple Vitamin (MULTIVITAMIN WITH MINERALS) TABS tablet Take 1 tablet by mouth daily.   pantoprazole (PROTONIX) 40 MG tablet Take 1 tablet (40 mg total) by mouth 2 (two) times daily.   potassium chloride (KLOR-CON) 10 MEQ tablet Take 1 tablet (10 mEq total) by mouth 2 (two) times daily.    theophylline (THEODUR) 300 MG 12 hr tablet Take 1 tablet (300 mg total) by mouth 2 (two) times daily.   No facility-administered encounter medications on file as of 11/21/2020.    Surgical History: Past Surgical History:  Procedure Laterality Date   BLADDER SUSPENSION N/A 05/23/2020   Procedure: TRANSVAGINAL TAPE (TVT) PROCEDURE;  Surgeon: Gae Dry, MD;  Location: [facility] ORS;  Service: Gynecology;  Laterality: N/A;   cataract surgery     CHOLECYSTECTOMY     COLONOSCOPY WITH PROPOFOL N/A 01/06/2018   Procedure: COLONOSCOPY WITH PROPOFOL;  Surgeon: Lollie Sails, MD;  [facility] END[facility]COPY;  Service: Endoscopy;  Laterality: N/A;   CYSTOCELE REPAIR N/A 05/23/2020   Procedure: ANTERIOR REPAIR (CYSTOCELE);  Surgeon: Gae Dry, MD;  Location: [facility] ORS;  Service: Gynecology;  Laterality: N/A;   CYSTOSCOPY N/A 05/23/2020   Procedure: CYSTOSCOPY;  Surgeon: Gae Dry, MD;  Location: [facility] ORS;  Service: Gynecology;  Laterality: N/A;   ESOPHAGEAL DILATION     ESOPHAGOGASTRODUODENOSCOPY (EGD) WITH PROPOFOL N/A 01/06/2018   Procedure: ESOPHAGOGASTRODUODENOSCOPY (EGD) WITH PROPOFOL;  Surgeon: Lollie Sails, MD;  Location: [facility] ENDOSCOPY;  Service: Endoscopy;  Laterality: N/A;   EYE SURGERY Bilateral 2014   cataract    HERNIA REPAIR     hiatial hernia repair     NASAL SINUS SURGERY     PUBOVAGINAL SLING N/A 05/23/2020   Procedure: Gaynelle Arabian;  Surgeon: Gae Dry, MD;  Location: [facility] ORS;  Service: Gynecology;  Laterality: N/A;   RECTOCELE REPAIR  05/23/2020   Procedure: POSTERIOR REPAIR (RECTOCELE);  Surgeon: Gae Dry, MD;  Location: [facility] ORS;  Service: Gynecology;;   VAGINAL HYSTERECTOMY Bilateral 05/23/2020   Procedure: HYSTERECTOMY VAGINAL BILATERIAL SALPINGO OOPHORECTOMY;  Surgeon: Gae Dry, MD;  Location: [facility] ORS;  Service: Gynecology;  Laterality: Bilateral;  LEFT fallopian tube, BILAT ovaries    Medical History: Past Medical History:   Diagnosis Date   Anemia    Asthma    CHF (congestive heart failure) ([place])    1991- unknown after asthma attack   Chicken pox    Complication of anesthesia 1998   woke up during procedure    COPD (chronic obstructive pulmonary disease) ([place])    Diabetes ([place])    Type II    Difficult 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    DVT (deep venous thrombosis) (HCC)    GERD (gastroesophageal reflux disease)    Hernia, hiatal 2007   HLD (hyperlipidemia)    Hypertension    Hypertension    Pneumonia    Seasonal allergies     Family History: Family History  Problem Relation Age of Onset   Hypertension Mother    Lung cancer Mother    Colon cancer Father    Colon polyps Sister    Diabetes Sister     Social History   Socioeconomic History   Marital status:  Married    Spouse name: Not on file   Number of children: Not on file   Years of education: Not on file   Highest education level: Not on file  Occupational History   Not on file  Tobacco Use   Smoking status: Never   Smokeless tobacco: Never  Vaping Use   Vaping Use: Never used  Substance and Sexual Activity   Alcohol use: Yes    Comment: glass of wine rarely   Drug use: No   Sexual activity: Not on file  Other Topics Concern   Not on file  Social History Narrative   Lives at home with family. Independent   Social Determinants of Health   Financial Resource Strain: Not on file  Food Insecurity: Not on file  Transportation Needs: Not on file  Physical Activity: Not on file  Stress: Not on file  Social Connections: Not on file  Intimate Partner Violence: Not on file      Review of Systems  Constitutional:  Negative for chills, fatigue and unexpected weight change.  HENT:  Negative for congestion, rhinorrhea, sneezing and sore throat.   Eyes:  Negative for redness.  Respiratory:  Negative for cough, chest tightness and shortness of breath.   Cardiovascular:  Negative for chest pain and palpitations.   Gastrointestinal:  Negative for abdominal pain, constipation, diarrhea, nausea and vomiting.  Genitourinary:  Negative for dysuria and frequency.  Musculoskeletal:  Negative for arthralgias, back pain, joint swelling and neck pain.  Skin:  Negative for rash.  Neurological: Negative.  Negative for tremors and numbness.  Hematological:  Negative for adenopathy. Does not bruise/bleed easily.  Psychiatric/Behavioral:  Negative for behavioral problems (Depression), sleep disturbance and suicidal ideas. The patient is not nervous/anxious.    Vital Signs: BP 140/80   Pulse 75   Temp 98.5 F (36.9 C)   Resp 16   Ht 5' 3"  (1.6 m)   Wt 196 lb (88.9 kg)   SpO2 98%   BMI 34.72 kg/m    Physical Exam Vitals reviewed.  Constitutional:      General: She is not in acute distress.    Appearance: Normal appearance. She is obese. She is not ill-appearing.  HENT:     Head: Normocephalic and atraumatic.  Eyes:     Extraocular Movements: Extraocular movements intact.     Pupils: Pupils are equal, round, and reactive to light.  Cardiovascular:     Rate and Rhythm: Normal rate and regular rhythm.     Pulses: Normal pulses.     Heart sounds: Normal heart sounds. No murmur heard. Pulmonary:     Effort: Pulmonary effort is normal. No respiratory distress.     Breath sounds: Normal breath sounds. No wheezing.  Skin:    Capillary Refill: Capillary refill takes less than 2 seconds.  Neurological:     Mental Status: She is alert and oriented to person, place, and time.     Cranial Nerves: No cranial nerve deficit.     Coordination: Coordination normal.     Gait: Gait normal.  Psychiatric:        Mood and Affect: Mood normal.        Behavior: Behavior normal.     Assessment/Plan: 1. Severe persistent asthma without complication Significantly improved, continue Breztri as prescribed.   2. Uncontrolled type 2 diabetes mellitus with hyperglycemia (HCC) Significantly improved, repeat A1C in 3  months, goal is A1C<7.0. continue current medications as prescribed.  - POCT HgB A1C  3. Essential hypertension Stable, continue medications as prescribed.   4. Acute respiratory failure with hypoxia (HCC) Resolved at this time, respiratory status is improved with Cassie Ross, will continue to monitor periodically.   5. Mixed hyperlipidemia Lipid panel minimally abnormal, LDL 101. Not on statin therapy, taking cod liver oil supplement and applying diet and lifestyle modifications as discussed during office visit.  6. Class 1 obesity due to excess calories with serious comorbidity and body mass index (BMI) of 34.0 to 34.9 in adult Currently she has not lost any significant weight since may 2022. Discussed diet and lifestyle modifications in detail during office visit.  Obesity Counseling: Risk Assessment: An assessment of behavioral risk factors was made today and includes lack of exercise sedentary lifestyle, lack of portion control and poor dietary habits.  Risk Modification Advice: She was counseled on portion control guidelines. Discussed decreasing high carb and high sugar foods. Encouraged increasing lean proteins. The detrimental long term effects of obesity on her health and ongoing poor compliance was also discussed with the patient.     General Counseling: jadi deyarmin understanding of the findings of todays visit and agrees with plan of treatment. I have discussed any further diagnostic evaluation that may be needed or ordered today. We also reviewed her medications today. she has been encouraged to call the office with any questions or concerns that should arise related to todays visit.    Orders Placed This Encounter  Procedures   POCT HgB A1C    No orders of the defined types were placed in this encounter.   Return in about 3 months (around 02/20/2021) for F/U, Recheck A1C, Shaily Librizzi PCP.   Total time spent:20 Minutes Time spent includes review of chart, medications,  test results, and follow up plan with the patient.   [place] Controlled Substance Database was reviewed by me.  This patient was seen by Jonetta Osgood, FNP-C in collaboration with Dr. Clayborn Bigness as a part of collaborative care agreement.   Rabecca Birge R. Valetta Fuller, MSN, FNP-C Internal medicine

## 2020-11-22 DIAGNOSIS — J449 Chronic obstructive pulmonary disease, unspecified: Secondary | ICD-10-CM | POA: Diagnosis not present

## 2020-11-30 ENCOUNTER — Encounter: Payer: Self-pay | Admitting: Physician Assistant

## 2020-11-30 ENCOUNTER — Telehealth (INDEPENDENT_AMBULATORY_CARE_PROVIDER_SITE_OTHER): Payer: HMO | Admitting: Physician Assistant

## 2020-11-30 VITALS — BP 153/83 | HR 114 | Ht 63.0 in | Wt 194.0 lb

## 2020-11-30 DIAGNOSIS — U071 COVID-19: Secondary | ICD-10-CM | POA: Diagnosis not present

## 2020-11-30 DIAGNOSIS — J4550 Severe persistent asthma, uncomplicated: Secondary | ICD-10-CM

## 2020-11-30 DIAGNOSIS — F411 Generalized anxiety disorder: Secondary | ICD-10-CM | POA: Diagnosis not present

## 2020-11-30 DIAGNOSIS — I10 Essential (primary) hypertension: Secondary | ICD-10-CM

## 2020-11-30 MED ORDER — NIRMATRELVIR/RITONAVIR (PAXLOVID)TABLET: 3.0000 | ORAL_TABLET | Freq: Two times a day (BID) | ORAL | 0 refills | Status: AC

## 2020-11-30 NOTE — Progress Notes (Signed)
[facility] [place], [place] [address]  Internal MEDICINE  Telephone Visit  Patient Name: Cassie Ross  [id number]  [id number]  Date of Service: 12/03/2020  I connected with the patient at 9:49 by telephone and verified the patients identity using two identifiers.   I discussed the limitations, risks, security and privacy concerns of performing an evaluation and management service by telephone and the availability of in person appointments. I also discussed with the patient that there may be a patient responsible charge related to the service.  The patient expressed understanding and agrees to proceed.    Chief Complaint  Patient presents with   Telephone Assessment    [id number]   Telephone Screen    2 covid test positive   Sinusitis   Headache    Heart is high     HPI Pt is here for a virtual acute visit -Tested positive this morning for covid -Symptoms started this morning, with nausea and HR/BP increased. Took an ASA. Fever started today. -Yesterday she does think her throat was a little scratchy. -Denies any cough, SOB or wheezing. Did take a breathing treatment and is using her normal inhalers since she does have a hx of severe asthma. -Headache and body chills have developed now -No sick contacts, had one round of vaccine. She has not tried any OTC medications other than the ASA.  Current Medication: Outpatient Encounter Medications as of 11/30/2020  Medication Sig   acetaminophen (TYLENOL) 500 MG tablet Take 1,000 mg by mouth every 8 (eight) hours as needed for moderate pain.    albuterol (PROVENTIL) (2.5 MG/3ML) 0.083% nebulizer solution Take 3 mLs (2.5 mg total) by nebulization every 6 (six) hours as needed for shortness of breath. USE 1 VIAL IN NEBULIZER EVERY 6 HOURS AS NEEDED   albuterol (VENTOLIN HFA) 108 (90 Base) MCG/ACT inhaler INHALE 2 PUFFS BY MOUTH EVERY 4 HOURS AS NEEDED FOR WHEEZING AND FOR SHORTNESS OF BREATH   ALPRAZolam  (XANAX) 0.5 MG tablet Take 1 tablet (0.5 mg total) by mouth 2 (two) times daily as needed for anxiety.   aspirin EC 81 MG tablet Take 81 mg by mouth daily.   Blood Glucose Monitoring Suppl (ONETOUCH VERIO) w/Device KIT Use as directed diag   Budeson-Glycopyrrol-Formoterol (BREZTRI AEROSPHERE) 160-9-4.8 MCG/ACT AERO Inhale 2 puffs into the lungs 2 (two) times daily.   carboxymethylcellul-glycerin (LUBRICANT DROPS/DUAL-ACTION) 0.5-0.9 % ophthalmic solution Place 1-2 drops into both eyes 3 (three) times daily as needed for dry eyes.   diltiazem (CARDIZEM CD) 180 MG 24 hr capsule Take 1 capsule (180 mg total) by mouth 2 (two) times daily.   fluticasone (FLONASE) 50 MCG/ACT nasal spray Place 1 spray into both nostrils in the morning and at bedtime.   furosemide (LASIX) 40 MG tablet Take 1 tablet (40 mg total) by mouth 2 (two) times daily as needed (fluid retention.).   glucose blood (ONETOUCH VERIO) test strip Use 1 strip to check glucose 3 times a daily as directed   insulin aspart (NOVOLOG FLEXPEN) 100 UNIT/ML FlexPen Inject 2-10 Units into the skin as directed. Sliding scale - use as directed QAC per sliding scale instructions. Max daily dose is 25 units in 24 hours. E11.65   insulin glargine (LANTUS SOLOSTAR) 100 UNIT/ML Solostar Pen Inject 10 Units into the skin at bedtime.   ipratropium-albuterol (DUONEB) 0.5-2.5 (3) MG/3ML SOLN Inhale 3 mLs into the lungs every 4 (four) hours as needed (wheezing/shortness of breath). J44.0   LEADER UNIFINE PENTIPS  32G X 4 MM MISC USE AS DIRECTED WITH INSULIN   loratadine (CLARITIN) 10 MG tablet Take 10 mg by mouth daily.   montelukast (SINGULAIR) 10 MG tablet Take 1 tablet (10 mg total) by mouth at bedtime.   Multiple Vitamin (MULTIVITAMIN WITH MINERALS) TABS tablet Take 1 tablet by mouth daily.   nirmatrelvir/ritonavir EUA (PAXLOVID) 20 x 150 MG & 10 x 100MG TABS Take 3 tablets by mouth 2 (two) times daily for 5 days.   pantoprazole (PROTONIX) 40 MG tablet Take 1  tablet (40 mg total) by mouth 2 (two) times daily.   potassium chloride (KLOR-CON) 10 MEQ tablet Take 1 tablet (10 mEq total) by mouth 2 (two) times daily.   theophylline (THEODUR) 300 MG 12 hr tablet Take 1 tablet (300 mg total) by mouth 2 (two) times daily.   No facility-administered encounter medications on file as of 11/30/2020.    Surgical History: Past Surgical History:  Procedure Laterality Date   BLADDER SUSPENSION N/A 05/23/2020   Procedure: TRANSVAGINAL TAPE (TVT) PROCEDURE;  Surgeon: Gae Dry, MD;  Location: [facility] ORS;  Service: Gynecology;  Laterality: N/A;   cataract surgery     CHOLECYSTECTOMY     COLONOSCOPY WITH PROPOFOL N/A 01/06/2018   Procedure: COLONOSCOPY WITH PROPOFOL;  Surgeon: Lollie Sails, MD;  Location: [facility] ENDOSCOPY;  Service: Endoscopy;  Laterality: N/A;   CYSTOCELE REPAIR N/A 05/23/2020   Procedure: ANTERIOR REPAIR (CYSTOCELE);  Surgeon: Gae Dry, MD;  Location: [facility] ORS;  Service: Gynecology;  Laterality: N/A;   CYSTOSCOPY N/A 05/23/2020   Procedure: CYSTOSCOPY;  Surgeon: Gae Dry, MD;  Location: [facility] ORS;  Service: Gynecology;  Laterality: N/A;   ESOPHAGEAL DILATION     ESOPHAGOGASTRODUODENOSCOPY (EGD) WITH PROPOFOL N/A 01/06/2018   Procedure: ESOPHAGOGASTRODUODENOSCOPY (EGD) WITH PROPOFOL;  Surgeon: Lollie Sails, MD;  Location[facility] ENDOSCOPY;  Service: Endoscopy;  Laterality: N/A;   EYE SURGERY Bilateral 2014   cataract    HERNIA REPAIR     hiatial hernia repair     NASAL SINUS SURGERY     PUBOVAGINAL SLING N/A 05/23/2020   Procedure: Gaynelle Arabian;  Surgeon: Gae Dry, MD;  Location: [facility] ORS;  Service: Gynecology;  Laterality: N/A;   RECTOCELE REPAIR  05/23/2020   Procedure: POSTERIOR REPAIR (RECTOCELE);  Surgeon: Gae Dry, MD;  Location: [facility] ORS;  Service: Gynecology;;   VAGINAL HYSTERECTOMY Bilateral 05/23/2020   Procedure: HYSTERECTOMY VAGINAL BILATERIAL SALPINGO OOPHORECTOMY;  Surgeon: Gae Dry, MD;  Location: [facility] ORS;  Service: Gynecology;  Laterality: Bilateral;  LEFT fallopian tube, BILAT ovaries    Medical History: Past Medical History:  Diagnosis Date   Anemia    Asthma    CHF (congestive heart failure) ([place])    1991- unknown after asthma attack   Chicken pox    Complication of anesthesia 1998   woke up during procedure    COPD (chronic obstructive pulmonary disease) ([place])    Diabetes ([place])    Type II    Difficult 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    DVT (deep venous thrombosis) (HCC)    GERD (gastroesophageal reflux disease)    Hernia, hiatal 2007   HLD (hyperlipidemia)    Hypertension    Hypertension    Pneumonia    Seasonal allergies     Family History: Family History  Problem Relation Age of Onset   Hypertension Mother    Lung cancer Mother    Colon cancer Father  Colon polyps Sister    Diabetes Sister     Social History   Socioeconomic History   Marital status: Married    Spouse name: Not on file   Number of children: Not on file   Years of education: Not on file   Highest education level: Not on file  Occupational History   Not on file  Tobacco Use   Smoking status: Never   Smokeless tobacco: Never  Vaping Use   Vaping Use: Never used  Substance and Sexual Activity   Alcohol use: Yes    Comment: glass of wine rarely   Drug use: No   Sexual activity: Not on file  Other Topics Concern   Not on file  Social History Narrative   Lives at home with family. Independent   Social Determinants of Health   Financial Resource Strain: Not on file  Food Insecurity: Not on file  Transportation Needs: Not on file  Physical Activity: Not on file  Stress: Not on file  Social Connections: Not on file  Intimate Partner Violence: Not on file      Review of Systems  Constitutional:  Positive for chills, fatigue and fever.  HENT:  Positive for postnasal drip and sore throat. Negative for congestion and mouth sores.    Respiratory:  Negative for cough, shortness of breath and wheezing.   Cardiovascular:  Negative for chest pain.  Gastrointestinal:  Positive for nausea.  Genitourinary:  Negative for flank pain.  Neurological:  Positive for headaches.  Psychiatric/Behavioral: Negative.     Vital Signs: BP (!) 153/83   Pulse (!) 114   Ht 5' 3"  (1.6 m)   Wt 194 lb (88 kg)   SpO2 96% Comment: room air  BMI 34.37 kg/m    Observation/Objective:   Pt is able to carry out conversation  Assessment/Plan: 1. Acute COVID-19 Will start on paxlovid and take tylenol as needed. Educated to rest and drink plenty of fluids.  Patient will call if not improving or go to emergency room if sudden decline - nirmatrelvir/ritonavir EUA (PAXLOVID) 20 x 150 MG & 10 x 100MG TABS; Take 3 tablets by mouth 2 (two) times daily for 5 days.  Dispense: 30 tablet; Refill: 0  2. Severe persistent asthma without complication Stable, continue current inhalers and breathing treatments as needed  3. Essential hypertension BP/HR elevated.  Patient educated to monitor closely and thinks this made be due to anxiety over having COVID   4. Generalized anxiety disorder Continue current medication as needed   General Counseling: Zuma verbalizes understanding of the findings of today's phone visit and agrees with plan of treatment. I have discussed any further diagnostic evaluation that may be needed or ordered today. We also reviewed her medications today. she has been encouraged to call the office with any questions or concerns that should arise related to todays visit.    No orders of the defined types were placed in this encounter.   Meds ordered this encounter  Medications   nirmatrelvir/ritonavir EUA (PAXLOVID) 20 x 150 MG & 10 x 100MG TABS    Sig: Take 3 tablets by mouth 2 (two) times daily for 5 days.    Dispense:  30 tablet    Refill:  0    Time spent:30 Minutes    Dr Lavera Guise Internal medicine

## 2020-12-03 ENCOUNTER — Encounter: Payer: Self-pay | Admitting: Nurse Practitioner

## 2020-12-03 DIAGNOSIS — J9601 Acute respiratory failure with hypoxia: Secondary | ICD-10-CM | POA: Insufficient documentation

## 2020-12-11 ENCOUNTER — Telehealth: Payer: Self-pay

## 2020-12-11 NOTE — Telephone Encounter (Signed)
Called and advised pt that we will be calling for their appt in between 9 am and 10 am

## 2020-12-12 ENCOUNTER — Telehealth (INDEPENDENT_AMBULATORY_CARE_PROVIDER_SITE_OTHER): Payer: HMO | Admitting: Nurse Practitioner

## 2020-12-12 ENCOUNTER — Encounter: Payer: Self-pay | Admitting: Nurse Practitioner

## 2020-12-12 VITALS — BP 135/85 | HR 85 | Resp 16 | Ht 63.0 in | Wt 192.0 lb

## 2020-12-12 DIAGNOSIS — J449 Chronic obstructive pulmonary disease, unspecified: Secondary | ICD-10-CM | POA: Diagnosis not present

## 2020-12-12 DIAGNOSIS — J069 Acute upper respiratory infection, unspecified: Secondary | ICD-10-CM | POA: Diagnosis not present

## 2020-12-12 DIAGNOSIS — K219 Gastro-esophageal reflux disease without esophagitis: Secondary | ICD-10-CM

## 2020-12-12 DIAGNOSIS — J4550 Severe persistent asthma, uncomplicated: Secondary | ICD-10-CM | POA: Diagnosis not present

## 2020-12-12 MED ORDER — THEOPHYLLINE ER 300 MG PO TB12: 300.0000 mg | ORAL_TABLET | Freq: Two times a day (BID) | ORAL | 1 refills | Status: DC

## 2020-12-12 MED ORDER — DOXYCYCLINE HYCLATE 100 MG PO TABS
100.0000 mg | ORAL_TABLET | Freq: Two times a day (BID) | ORAL | 2 refills | Status: DC
Start: 2020-12-12 — End: 2021-04-16

## 2020-12-12 MED ORDER — AZITHROMYCIN 250 MG PO TABS: ORAL_TABLET | ORAL | 2 refills | Status: DC

## 2020-12-12 MED ORDER — MONTELUKAST SODIUM 10 MG PO TABS: 10.0000 mg | ORAL_TABLET | Freq: Every day | ORAL | 1 refills | Status: DC

## 2020-12-12 MED ORDER — PANTOPRAZOLE SODIUM 40 MG PO TBEC: 40.0000 mg | DELAYED_RELEASE_TABLET | Freq: Two times a day (BID) | ORAL | 2 refills | Status: DC

## 2020-12-12 MED ORDER — PREDNISONE 20 MG PO TABS: ORAL_TABLET | ORAL | 1 refills | Status: DC

## 2020-12-12 NOTE — Progress Notes (Signed)
[facility] [place], [place] [address]  Internal MEDICINE  Telephone Visit  Patient Name: Cassie Ross  [id number]  [id number]  Date of Service: 12/12/2020  I connected with the patient at 8:30 AM by telephone and verified the patients identity using two identifiers.   I discussed the limitations, risks, security and privacy concerns of performing an evaluation and management service by telephone and the availability of in person appointments. I also discussed with the patient that there may be a patient responsible charge related to the service.  The patient expressed understanding and agrees to proceed.    Chief Complaint  Patient presents with   Acute Visit    Congestion, tested pos for covid last Thursday    Telephone Assessment    Phone call   Telephone Screen    [phone]   Cough   Headache    HPI Adeena presents for a telehealth virtual visit for cough and headache post-covid infection. She tested positive and was prescribed paxlovid on 11/30/20. She had a virtual visit with Drema Dallas PA-C. After she completed the course of paxlovid, she started having  sinus pressure and chest congestion 3 days later. She has asthma and reports that her respiratory status goes downhill very fast sometimes.    Current Medication: Outpatient Encounter Medications as of 12/12/2020  Medication Sig   acetaminophen (TYLENOL) 500 MG tablet Take 1,000 mg by mouth every 8 (eight) hours as needed for moderate pain.    albuterol (PROVENTIL) (2.5 MG/3ML) 0.083% nebulizer solution Take 3 mLs (2.5 mg total) by nebulization every 6 (six) hours as needed for shortness of breath. USE 1 VIAL IN NEBULIZER EVERY 6 HOURS AS NEEDED   albuterol (VENTOLIN HFA) 108 (90 Base) MCG/ACT inhaler INHALE 2 PUFFS BY MOUTH EVERY 4 HOURS AS NEEDED FOR WHEEZING AND FOR SHORTNESS OF BREATH   ALPRAZolam (XANAX) 0.5 MG tablet Take 1 tablet (0.5 mg total) by mouth 2 (two) times daily as  needed for anxiety.   aspirin EC 81 MG tablet Take 81 mg by mouth daily.   azithromycin (ZITHROMAX) 250 MG tablet Take one tab a day for 10 days for uri   Blood Glucose Monitoring Suppl (ONETOUCH VERIO) w/Device KIT Use as directed diag   Budeson-Glycopyrrol-Formoterol (BREZTRI AEROSPHERE) 160-9-4.8 MCG/ACT AERO Inhale 2 puffs into the lungs 2 (two) times daily.   carboxymethylcellul-glycerin (LUBRICANT DROPS/DUAL-ACTION) 0.5-0.9 % ophthalmic solution Place 1-2 drops into both eyes 3 (three) times daily as needed for dry eyes.   diltiazem (CARDIZEM CD) 180 MG 24 hr capsule Take 1 capsule (180 mg total) by mouth 2 (two) times daily.   doxycycline (VIBRA-TABS) 100 MG tablet Take 1 tablet (100 mg total) by mouth 2 (two) times daily. With food   fluticasone (FLONASE) 50 MCG/ACT nasal spray Place 1 spray into both nostrils in the morning and at bedtime.   furosemide (LASIX) 40 MG tablet Take 1 tablet (40 mg total) by mouth 2 (two) times daily as needed (fluid retention.).   glucose blood (ONETOUCH VERIO) test strip Use 1 strip to check glucose 3 times a daily as directed   insulin aspart (NOVOLOG FLEXPEN) 100 UNIT/ML FlexPen Inject 2-10 Units into the skin as directed. Sliding scale - use as directed QAC per sliding scale instructions. Max daily dose is 25 units in 24 hours. E11.65   insulin glargine (LANTUS SOLOSTAR) 100 UNIT/ML Solostar Pen Inject 10 Units into the skin at bedtime.   ipratropium-albuterol (DUONEB) 0.5-2.5 (3) MG/3ML SOLN Inhale  3 mLs into the lungs every 4 (four) hours as needed (wheezing/shortness of breath). 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   Multiple Vitamin (MULTIVITAMIN WITH MINERALS) TABS tablet Take 1 tablet by mouth daily.   potassium chloride (KLOR-CON) 10 MEQ tablet Take 1 tablet (10 mEq total) by mouth 2 (two) times daily.   predniSONE (DELTASONE) 20 MG tablet Take 1  tab po 3 x day for 3 days then take 1 tab po 2 x a day for 3 days and then take 1 tab po daily for 3 days. Kep extra on hand in case another taper is needed at a later date.   [DISCONTINUED] montelukast (SINGULAIR) 10 MG tablet Take 1 tablet (10 mg total) by mouth at bedtime.   [DISCONTINUED] pantoprazole (PROTONIX) 40 MG tablet Take 1 tablet (40 mg total) by mouth 2 (two) times daily.   [DISCONTINUED] theophylline (THEODUR) 300 MG 12 hr tablet Take 1 tablet (300 mg total) by mouth 2 (two) times daily.   montelukast (SINGULAIR) 10 MG tablet Take 1 tablet (10 mg total) by mouth at bedtime.   pantoprazole (PROTONIX) 40 MG tablet Take 1 tablet (40 mg total) by mouth 2 (two) times daily.   theophylline (THEODUR) 300 MG 12 hr tablet Take 1 tablet (300 mg total) by mouth 2 (two) times daily.   No facility-administered encounter medications on file as of 12/12/2020.    Surgical History: Past Surgical History:  Procedure Laterality Date   BLADDER SUSPENSION N/A 05/23/2020   Procedure: TRANSVAGINAL TAPE (TVT) PROCEDURE;  Surgeon: Gae Dry, MD;  Location: [facility] ORS;  Service: Gynecology;  Laterality: N/A;   cataract surgery     CHOLECYSTECTOMY     COLONOSCOPY WITH PROPOFOL N/A 01/06/2018   Procedure: COLONOSCOPY WITH PROPOFOL;  Surgeon: Lollie Sails, MD;  Location: [facility] ENDOSCOPY;  Service: Endoscopy;  Laterality: N/A;   CYSTOCELE REPAIR N/A 05/23/2020   Procedure: ANTERIOR REPAIR (CYSTOCELE);  Surgeon: Gae Dry, MD;  Location: [facility] ORS;  Service: Gynecology;  Laterality: N/A;   CYSTOSCOPY N/A 05/23/2020   Procedure: CYSTOSCOPY;  Surgeon: Gae Dry, MD;  Location: [facility] ORS;  Service: Gynecology;  Laterality: N/A;   ESOPHAGEAL DILATION     ESOPHAGOGASTRODUODENOSCOPY (EGD) WITH PROPOFOL N/A 01/06/2018   Procedure: ESOPHAGOGASTRODUODENOSCOPY (EGD) WITH PROPOFOL;  Surgeon: Lollie Sails, MD;  Location: [facility] ENDOSCOPY;  Service: Endoscopy;  Laterality: N/A;   EYE SURGERY  Bilateral 2014   cataract    HERNIA REPAIR     hiatial hernia repair     NASAL SINUS SURGERY     PUBOVAGINAL SLING N/A 05/23/2020   Procedure: Gaynelle Arabian;  Surgeon: Gae Dry, MD;  Location: [facility] ORS;  Service: Gynecology;  Laterality: N/A;   RECTOCELE REPAIR  05/23/2020   Procedure: POSTERIOR REPAIR (RECTOCELE);  Surgeon: Gae Dry, MD;  Location: [facility] ORS;  Service: Gynecology;;   VAGINAL HYSTERECTOMY Bilateral 05/23/2020   Procedure: HYSTERECTOMY VAGINAL BILATERIAL SALPINGO OOPHORECTOMY;  Surgeon: Gae Dry, MD;  Location: [facility] ORS;  Service: Gynecology;  Laterality: Bilateral;  LEFT fallopian tube, BILAT ovaries    Medical History: Past Medical History:  Diagnosis Date   Anemia    Asthma    CHF (congestive heart failure) ([place])    1991- unknown after asthma attack   Chicken pox    Complication of anesthesia 1998   woke up during  procedure    COPD (chronic obstructive pulmonary disease) (HCC)    Diabetes (HCC)    Type II    Difficult 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    DVT (deep venous thrombosis) (HCC)    GERD (gastroesophageal reflux disease)    Hernia, hiatal 2007   HLD (hyperlipidemia)    Hypertension    Hypertension    Pneumonia    Seasonal allergies     Family History: Family History  Problem Relation Age of Onset   Hypertension Mother    Lung cancer Mother    Colon cancer Father    Colon polyps Sister    Diabetes Sister     Social History   Socioeconomic History   Marital status: Married    Spouse name: Not on file   Number of children: Not on file   Years of education: Not on file   Highest education level: Not on file  Occupational History   Not on file  Tobacco Use   Smoking status: Never   Smokeless tobacco: Never  Vaping Use   Vaping Use: Never used  Substance and Sexual Activity   Alcohol use: Yes    Comment: glass of wine rarely   Drug use: No   Sexual activity: Not on file  Other Topics Concern    Not on file  Social History Narrative   Lives at home with family. Independent   Social Determinants of Health   Financial Resource Strain: Not on file  Food Insecurity: Not on file  Transportation Needs: Not on file  Physical Activity: Not on file  Stress: Not on file  Social Connections: Not on file  Intimate Partner Violence: Not on file      Review of Systems  Constitutional:  Positive for fatigue. Negative for chills and fever.  HENT:  Positive for congestion, postnasal drip, sinus pressure, sinus pain and sore throat. Negative for ear pain, rhinorrhea and sneezing.   Eyes: Negative.  Negative for pain.  Respiratory:  Positive for cough, chest tightness, shortness of breath and wheezing.   Cardiovascular: Negative.  Negative for chest pain and palpitations.  Gastrointestinal: Negative.  Negative for abdominal pain, constipation, diarrhea, nausea and vomiting.  Musculoskeletal: Negative.  Negative for myalgias.  Skin: Negative.  Negative for rash.  Neurological:  Positive for dizziness and headaches. Negative for light-headedness.   Vital Signs: BP 135/85   Pulse 85   Resp 16   Ht _0  (1.6 m)   Wt 192 lb (87.1 kg)   SpO2 98%   BMI 34.01 kg/m    Observation/Objective: She is alert and oriented and engages in conversation appropriately. She does not sound like she is in any acute distress over audio call.     Assessment/Plan: 1. Acute upper respiratory infection Previously treated with paxlovid, increased drainage, head and chest congestion. Empiric antibiotic treatment ordered, with refills due to patient having increased recurrence of respiratory infections during this time of year as well.  - doxycycline (VIBRA-TABS) 100 MG tablet; Take 1 tablet (100 mg total) by mouth 2 (two) times daily. With food  Dispense: 42 tablet; Refill: 2 - azithromycin (ZITHROMAX) 250 MG tablet; Take one tab a day for 10 days for uri  Dispense: 10 tablet; Refill: 2  2. Severe  persistent asthma without complication Prednisone taper prescribed with additional amount for additional prednisone tapers as needed. Theophylline and montelukast prescription reordered.  - predniSONE (DELTASONE) 20 MG tablet; Take 1 tab po 3  x day for 3 days then take 1 tab po 2 x a day for 3 days and then take 1 tab po daily for 3 days. Kep extra on hand in case another taper is needed at a later date.  Dispense: 60 tablet; Refill: 1 - theophylline (THEODUR) 300 MG 12 hr tablet; Take 1 tablet (300 mg total) by mouth 2 (two) times daily.  Dispense: 180 tablet; Refill: 1 - montelukast (SINGULAIR) 10 MG tablet; Take 1 tablet (10 mg total) by mouth at bedtime.  Dispense: 90 tablet; Refill: 1  3. Gastroesophageal reflux disease without esophagitis Refill ordered.  - pantoprazole (PROTONIX) 40 MG tablet; Take 1 tablet (40 mg total) by mouth 2 (two) times daily.  Dispense: 60 tablet; Refill: 2  4. Chronic obstructive pulmonary disease, unspecified COPD type ([place]) Refill ordered.  - montelukast (SINGULAIR) 10 MG tablet; Take 1 tablet (10 mg total) by mouth at bedtime.  Dispense: 90 tablet; Refill: 1   General Counseling: Sharnae verbalizes understanding of the findings of today's phone visit and agrees with plan of treatment. I have discussed any further diagnostic evaluation that may be needed or ordered today. We also reviewed her medications today. she has been encouraged to call the office with any questions or concerns that should arise related to todays visit.  Return if symptoms worsen or fail to improve.   No orders of the defined types were placed in this encounter.   Meds ordered this encounter  Medications   doxycycline (VIBRA-TABS) 100 MG tablet    Sig: Take 1 tablet (100 mg total) by mouth 2 (two) times daily. With food    Dispense:  42 tablet    Refill:  2   azithromycin (ZITHROMAX) 250 MG tablet    Sig: Take one tab a day for 10 days for uri    Dispense:  10 tablet    Refill:   2    Refills due to recurrent respiratory infections,patient has asthma.   predniSONE (DELTASONE) 20 MG tablet    Sig: Take 1 tab po 3 x day for 3 days then take 1 tab po 2 x a day for 3 days and then take 1 tab po daily for 3 days. Kep extra on hand in case another taper is needed at a later date.    Dispense:  60 tablet    Refill:  1   theophylline (THEODUR) 300 MG 12 hr tablet    Sig: Take 1 tablet (300 mg total) by mouth 2 (two) times daily.    Dispense:  180 tablet    Refill:  1    Please consider 90 day supplies to promote better adherence   pantoprazole (PROTONIX) 40 MG tablet    Sig: Take 1 tablet (40 mg total) by mouth 2 (two) times daily.    Dispense:  60 tablet    Refill:  2   montelukast (SINGULAIR) 10 MG tablet    Sig: Take 1 tablet (10 mg total) by mouth at bedtime.    Dispense:  90 tablet    Refill:  1    Time spent:20 Minutes Time spent with patient included reviewing progress notes, labs, imaging studies, and discussing plan for follow up.  [place] Controlled Substance Database was reviewed by me for overdose risk score (ORS) if appropriate.  This patient was seen by Jonetta Osgood, FNP-C in collaboration with Dr. Clayborn Bigness as a part of collaborative care agreement.  Ladeidra Borys R. Valetta Fuller, MSN, FNP-C Internal medicine

## 2020-12-22 DIAGNOSIS — J449 Chronic obstructive pulmonary disease, unspecified: Secondary | ICD-10-CM | POA: Diagnosis not present

## 2021-01-09 ENCOUNTER — Telehealth: Payer: Self-pay | Admitting: Internal Medicine

## 2021-01-09 NOTE — Chronic Care Management (AMB) (Signed)
  Chronic Care Management   Outreach Note  01/09/2021 Name: MCKENZE SLONE MRN: [id number] DOB: Oct 28, 1960  Referred by: Lavera Guise, MD Reason for referral : No chief complaint on file.   An unsuccessful telephone outreach was attempted today. The patient was referred to the pharmacist for assistance with care management and care coordination.   Follow Up Plan:   Tatjana Dellinger Upstream Scheduler

## 2021-01-22 DIAGNOSIS — J449 Chronic obstructive pulmonary disease, unspecified: Secondary | ICD-10-CM | POA: Diagnosis not present

## 2021-01-24 ENCOUNTER — Ambulatory Visit (INDEPENDENT_AMBULATORY_CARE_PROVIDER_SITE_OTHER): Payer: HMO | Admitting: Internal Medicine

## 2021-01-24 ENCOUNTER — Other Ambulatory Visit: Payer: Self-pay

## 2021-01-24 DIAGNOSIS — R0602 Shortness of breath: Secondary | ICD-10-CM | POA: Diagnosis not present

## 2021-01-24 DIAGNOSIS — J449 Chronic obstructive pulmonary disease, unspecified: Secondary | ICD-10-CM

## 2021-01-24 LAB — PULMONARY FUNCTION TEST

## 2021-01-30 ENCOUNTER — Other Ambulatory Visit: Payer: Self-pay

## 2021-01-30 ENCOUNTER — Telehealth: Payer: Self-pay

## 2021-01-30 MED ORDER — OSELTAMIVIR PHOSPHATE 75 MG PO CAPS: 75.0000 mg | ORAL_CAPSULE | Freq: Two times a day (BID) | ORAL | 0 refills | Status: DC

## 2021-01-30 NOTE — Telephone Encounter (Signed)
Pt called and LMOM advising she and her husband was exposed to the flu by their grandaughter.  Granddaughter tested positive today.  Per [facility] we sent in Tamiflu 75 mg take 1 twice a day for 5 days.  Informed pt to not take the Tamiflu unless she starts having symptoms.  Sent prescription to [facility] in [place].

## 2021-02-11 ENCOUNTER — Other Ambulatory Visit: Payer: Self-pay | Admitting: Nurse Practitioner

## 2021-02-11 DIAGNOSIS — F411 Generalized anxiety disorder: Secondary | ICD-10-CM

## 2021-02-11 DIAGNOSIS — I10 Essential (primary) hypertension: Secondary | ICD-10-CM

## 2021-02-11 NOTE — Telephone Encounter (Signed)
Med sent.

## 2021-02-20 ENCOUNTER — Other Ambulatory Visit: Payer: Self-pay

## 2021-02-20 ENCOUNTER — Ambulatory Visit (INDEPENDENT_AMBULATORY_CARE_PROVIDER_SITE_OTHER): Payer: HMO | Admitting: Nurse Practitioner

## 2021-02-20 ENCOUNTER — Encounter: Payer: Self-pay | Admitting: Nurse Practitioner

## 2021-02-20 VITALS — BP 140/79 | HR 95 | Temp 98.5°F | Resp 16 | Ht 63.0 in | Wt 202.0 lb

## 2021-02-20 DIAGNOSIS — E1165 Type 2 diabetes mellitus with hyperglycemia: Secondary | ICD-10-CM | POA: Diagnosis not present

## 2021-02-20 DIAGNOSIS — M1712 Unilateral primary osteoarthritis, left knee: Secondary | ICD-10-CM

## 2021-02-20 DIAGNOSIS — J449 Chronic obstructive pulmonary disease, unspecified: Secondary | ICD-10-CM

## 2021-02-20 LAB — POCT GLYCOSYLATED HEMOGLOBIN (HGB A1C): Hemoglobin A1C: 7 % — AB (ref 4.0–5.6)

## 2021-02-20 NOTE — Progress Notes (Signed)
[facility] [place], [place] [address]  Internal MEDICINE  Office Visit Note  Patient Name: Cassie Ross  [id number]  [id number]  Date of Service: 02/20/2021  Chief Complaint  Patient presents with   Follow-up    Left knee swelling   Anemia   Asthma   COPD   Hyperlipidemia   Hypertension   Gastroesophageal Reflux   Diabetes    HPI Cassie Ross presents for a follow up visit for diabetes, COPD and left knee swelling. Her A1C is 7.0 today and has improved by 0.3 when compared to her a1c in September. She continues to do well with trelegy and she reports her breathing is stable. She has left knee arthritis and swelling and wants to know what else can be done about it.    Current Medication: Outpatient Encounter Medications as of 02/20/2021  Medication Sig   acetaminophen (TYLENOL) 500 MG tablet Take 1,000 mg by mouth every 8 (eight) hours as needed for moderate pain.    albuterol (PROVENTIL) (2.5 MG/3ML) 0.083% nebulizer solution Take 3 mLs (2.5 mg total) by nebulization every 6 (six) hours as needed for shortness of breath. USE 1 VIAL IN NEBULIZER EVERY 6 HOURS AS NEEDED   albuterol (VENTOLIN HFA) 108 (90 Base) MCG/ACT inhaler INHALE 2 PUFFS BY MOUTH EVERY 4 HOURS AS NEEDED FOR WHEEZING AND FOR SHORTNESS OF BREATH   ALPRAZolam (XANAX) 0.5 MG tablet Take 1 tablet by mouth twice daily as needed for anxiety   aspirin EC 81 MG tablet Take 81 mg by mouth daily.   azithromycin (ZITHROMAX) 250 MG tablet Take one tab a day for 10 days for uri   Blood Glucose Monitoring Suppl (ONETOUCH VERIO) w/Device KIT Use as directed diag   Budeson-Glycopyrrol-Formoterol (BREZTRI AEROSPHERE) 160-9-4.8 MCG/ACT AERO Inhale 2 puffs into the lungs 2 (two) times daily.   carboxymethylcellul-glycerin (LUBRICANT DROPS/DUAL-ACTION) 0.5-0.9 % ophthalmic solution Place 1-2 drops into both eyes 3 (three) times daily as needed for dry eyes.   diltiazem (CARDIZEM CD) 180 MG 24 hr capsule  Take 1 capsule by mouth twice daily   doxycycline (VIBRA-TABS) 100 MG tablet Take 1 tablet (100 mg total) by mouth 2 (two) times daily. With food   fluticasone (FLONASE) 50 MCG/ACT nasal spray Place 1 spray into both nostrils in the morning and at bedtime.   furosemide (LASIX) 40 MG tablet Take 1 tablet (40 mg total) by mouth 2 (two) times daily as needed (fluid retention.).   glucose blood (ONETOUCH VERIO) test strip Use 1 strip to check glucose 3 times a daily as directed   insulin aspart (NOVOLOG FLEXPEN) 100 UNIT/ML FlexPen Inject 2-10 Units into the skin as directed. Sliding scale - use as directed QAC per sliding scale instructions. Max daily dose is 25 units in 24 hours. E11.65   insulin glargine (LANTUS SOLOSTAR) 100 UNIT/ML Solostar Pen Inject 10 Units into the skin at bedtime.   ipratropium-albuterol (DUONEB) 0.5-2.5 (3) MG/3ML SOLN Inhale 3 mLs into the lungs every 4 (four) hours as needed (wheezing/shortness of breath). 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   montelukast (SINGULAIR) 10 MG tablet Take 1 tablet (10 mg total) by mouth at bedtime.   Multiple Vitamin (MULTIVITAMIN WITH MINERALS) TABS tablet Take 1 tablet by mouth daily.   pantoprazole (PROTONIX) 40 MG tablet Take 1 tablet (40 mg total) by mouth 2 (two)  times daily.   potassium chloride (KLOR-CON) 10 MEQ tablet Take 1 tablet (10 mEq total) by mouth 2 (two) times daily.   predniSONE (DELTASONE) 20 MG tablet Take 1 tab po 3 x day for 3 days then take 1 tab po 2 x a day for 3 days and then take 1 tab po daily for 3 days. Kep extra on hand in case another taper is needed at a later date.   [DISCONTINUED] oseltamivir (TAMIFLU) 75 MG capsule Take 1 capsule (75 mg total) by mouth 2 (two) times daily.   [DISCONTINUED] theophylline (THEODUR) 300 MG 12 hr tablet Take 1 tablet (300 mg total) by mouth 2 (two) times daily.   No  facility-administered encounter medications on file as of 02/20/2021.    Surgical History: Past Surgical History:  Procedure Laterality Date   BLADDER SUSPENSION N/A 05/23/2020   Procedure: TRANSVAGINAL TAPE (TVT) PROCEDURE;  Surgeon: Gae Dry, MD;  Location: [facility] ORS;  Service: Gynecology;  Laterality: N/A;   cataract surgery     CHOLECYSTECTOMY     COLONOSCOPY WITH PROPOFOL N/A 01/06/2018   Procedure: COLONOSCOPY WITH PROPOFOL;  Surgeon: Lollie Sails, MD;  Location: [facility] ENDOSCOPY;  Service: Endoscopy;  Laterality: N/A;   CYSTOCELE REPAIR N/A 05/23/2020   Procedure: ANTERIOR REPAIR (CYSTOCELE);  Surgeon: Gae Dry, MD;  Location: [facility] ORS;  Service: Gynecology;  Laterality: N/A;   CYSTOSCOPY N/A 05/23/2020   Procedure: CYSTOSCOPY;  Surgeon: Gae Dry, MD;  Location: [facility] ORS;  Service: Gynecology;  Laterality: N/A;   ESOPHAGEAL DILATION     ESOPHAGOGASTRODUODENOSCOPY (EGD) WITH PROPOFOL N/A 01/06/2018   Procedure: ESOPHAGOGASTRODUODENOSCOPY (EGD) WITH PROPOFOL;  Surgeon: Lollie Sails, MD;  Location: [facility] ENDOSCOPY;  Service: Endoscopy;  Laterality: N/A;   EYE SURGERY Bilateral 2014   cataract    HERNIA REPAIR     hiatial hernia repair     NASAL SINUS SURGERY     PUBOVAGINAL SLING N/A 05/23/2020   Procedure: Gaynelle Arabian;  Surgeon: Gae Dry, MD;  Location: [facility] ORS;  Service: Gynecology;  Laterality: N/A;   RECTOCELE REPAIR  05/23/2020   Procedure: POSTERIOR REPAIR (RECTOCELE);  Surgeon: Gae Dry, MD;  Location: [facility] ORS;  Service: Gynecology;;   VAGINAL HYSTERECTOMY Bilateral 05/23/2020   Procedure: HYSTERECTOMY VAGINAL BILATERIAL SALPINGO OOPHORECTOMY;  Surgeon: Gae Dry, MD;  Location: [facility] ORS;  Service: Gynecology;  Laterality: Bilateral;  LEFT fallopian tube, BILAT ovaries    Medical History: Past Medical History:  Diagnosis Date   Anemia    Asthma    CHF (congestive heart failure) ([place])    1991- unknown after  asthma attack   Chicken pox    Complication of anesthesia 1998   woke up during procedure    COPD (chronic obstructive pulmonary disease) ([place])    Diabetes ([place])    Type II    Difficult 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    DVT (deep venous thrombosis) (HCC)    GERD (gastroesophageal reflux disease)    Hernia, hiatal 2007   HLD (hyperlipidemia)    Hypertension    Hypertension    Pneumonia    Seasonal allergies     Family History: Family History  Problem Relation Age of Onset   Hypertension Mother    Lung cancer Mother    Colon cancer Father    Colon polyps Sister    Diabetes Sister     Social History   Socioeconomic History   Marital status:  Married    Spouse name: Not on file   Number of children: Not on file   Years of education: Not on file   Highest education level: Not on file  Occupational History   Not on file  Tobacco Use   Smoking status: Never   Smokeless tobacco: Never  Vaping Use   Vaping Use: Never used  Substance and Sexual Activity   Alcohol use: Yes    Comment: glass of wine rarely   Drug use: No   Sexual activity: Not on file  Other Topics Concern   Not on file  Social History Narrative   Lives at home with family. Independent   Social Determinants of Health   Financial Resource Strain: Not on file  Food Insecurity: Not on file  Transportation Needs: Not on file  Physical Activity: Not on file  Stress: Not on file  Social Connections: Not on file  Intimate Partner Violence: Not on file      Review of Systems  Constitutional:  Negative for chills, fatigue and unexpected weight change.  HENT:  Negative for congestion, rhinorrhea, sneezing and sore throat.   Eyes:  Negative for redness.  Respiratory: Negative.  Negative for cough, chest tightness, shortness of breath and wheezing.   Cardiovascular: Negative.  Negative for chest pain and palpitations.  Gastrointestinal:  Negative for abdominal pain, constipation,  diarrhea, nausea and vomiting.  Genitourinary:  Negative for dysuria and frequency.  Musculoskeletal:  Positive for arthralgias. Negative for back pain, joint swelling and neck pain.  Skin:  Negative for rash.  Neurological: Negative.  Negative for tremors and numbness.  Hematological:  Negative for adenopathy. Does not bruise/bleed easily.  Psychiatric/Behavioral:  Negative for behavioral problems (Depression), sleep disturbance and suicidal ideas. The patient is not nervous/anxious.    Vital Signs: BP 140/79    Pulse 95    Temp 98.5 F (36.9 C)    Resp 16    Ht $R'5\' 3"'JQ$  (1.6 m)    Wt 202 lb (91.6 kg)    SpO2 96%    BMI 35.78 kg/m    Physical Exam Vitals reviewed.  Constitutional:      Appearance: Normal appearance.  HENT:     Head: Normocephalic and atraumatic.  Eyes:     Pupils: Pupils are equal, round, and reactive to light.  Cardiovascular:     Rate and Rhythm: Normal rate and regular rhythm.  Pulmonary:     Effort: Pulmonary effort is normal. No respiratory distress.  Neurological:     Mental Status: She is alert and oriented to person, place, and time.     Cranial Nerves: No cranial nerve deficit.     Coordination: Coordination normal.     Gait: Gait normal.  Psychiatric:        Mood and Affect: Mood normal.        Behavior: Behavior normal.       Assessment/Plan: 1. Uncontrolled type 2 diabetes mellitus with hyperglycemia (HCC) A1C continues to improve, no change in current medications, repeat A1C in 3 months.  - POCT HgB A1C  2. Chronic obstructive pulmonary disease, unspecified COPD type ([place]) Continuing to see improvement with trelegy, continue as prescribed.   3. Primary osteoarthritis of left knee Information provided for [facility] in [place].    General Counseling: nesta scaturro understanding of the findings of todays visit and agrees with plan of treatment. I have discussed any further diagnostic evaluation that may be needed or ordered  today. We also reviewed  her medications today. she has been encouraged to call the office with any questions or concerns that should arise related to todays visit.    Orders Placed This Encounter  Procedures   POCT HgB A1C    No orders of the defined types were placed in this encounter.    Return in about 3 months (around 05/21/2021) for F/U, Recheck A1C, Kirra Verga PCP.   Total time spent:30 Minutes Time spent includes review of chart, medications, test results, and follow up plan with the patient.   [place] Controlled Substance Database was reviewed by me.  This patient was seen by Jonetta Osgood, FNP-C in collaboration with Dr. Clayborn Bigness as a part of collaborative care agreement.   Tahj Njoku R. Valetta Fuller, MSN, FNP-C Internal medicine

## 2021-02-20 NOTE — Patient Instructions (Signed)
[facility] [place]  New patients call: [phone]  Existing Patients Call: [phone]  Hours: Mon-Fri 8:00am-6:00pm  Fax: [phone]  Address: [address], [place], [place]

## 2021-03-08 ENCOUNTER — Telehealth: Payer: Self-pay | Admitting: Internal Medicine

## 2021-03-08 NOTE — Progress Notes (Signed)
°  Chronic Care Management   Note  03/08/2021 Name: Cassie Ross MRN: [id number] DOB: 1960/04/12  Cassie Ross is a 60 y.o. year old female who is a primary care patient of Lavera Guise, MD. I reached out to Garry Heater by phone today in response to a referral sent by Ms. Rick Duff PCP, Lavera Guise, MD.   Ms. Kiesel was given information about Chronic Care Management services today including:  CCM service includes personalized support from designated clinical staff supervised by her physician, including individualized plan of care and coordination with other care providers 24/7 contact phone numbers for assistance for urgent and routine care needs. Service will only be billed when office clinical staff spend 20 minutes or more in a month to coordinate care. Only one practitioner may furnish and bill the service in a calendar month. The patient may stop CCM services at any time (effective at the end of the month) by phone call to the office staff.   Patient agreed to services and verbal consent obtained.   Follow up plan:   Tatjana Secretary/administrator

## 2021-03-14 ENCOUNTER — Telehealth: Payer: Self-pay

## 2021-03-14 ENCOUNTER — Other Ambulatory Visit: Payer: Self-pay | Admitting: Nurse Practitioner

## 2021-03-14 DIAGNOSIS — J4550 Severe persistent asthma, uncomplicated: Secondary | ICD-10-CM

## 2021-03-14 NOTE — Telephone Encounter (Signed)
Pt calling; had hyst 05/23/20; has been spotting for about a day; does she need an appointment?  [phone]

## 2021-03-16 ENCOUNTER — Telehealth: Payer: Self-pay | Admitting: Student-PharmD

## 2021-03-16 ENCOUNTER — Ambulatory Visit: Payer: HMO | Admitting: Student-PharmD

## 2021-03-16 ENCOUNTER — Other Ambulatory Visit: Payer: Self-pay

## 2021-03-16 DIAGNOSIS — K219 Gastro-esophageal reflux disease without esophagitis: Secondary | ICD-10-CM

## 2021-03-16 DIAGNOSIS — F411 Generalized anxiety disorder: Secondary | ICD-10-CM

## 2021-03-16 DIAGNOSIS — I10 Essential (primary) hypertension: Secondary | ICD-10-CM

## 2021-03-16 DIAGNOSIS — E1165 Type 2 diabetes mellitus with hyperglycemia: Secondary | ICD-10-CM

## 2021-03-16 DIAGNOSIS — J4550 Severe persistent asthma, uncomplicated: Secondary | ICD-10-CM

## 2021-03-16 NOTE — Telephone Encounter (Signed)
[facility]

## 2021-03-16 NOTE — Progress Notes (Signed)
Chronic Care Management Pharmacy Assistant   Name: Cassie Ross  MRN: [id number] DOB: [birth year]  Reason for Encounter: PAP  Medications: Outpatient Encounter Medications as of 03/16/2021  Medication Sig   acetaminophen (TYLENOL) 500 MG tablet Take 1,000 mg by mouth every 8 (eight) hours as needed for moderate pain.    albuterol (PROVENTIL) (2.5 MG/3ML) 0.083% nebulizer solution Take 3 mLs (2.5 mg total) by nebulization every 6 (six) hours as needed for shortness of breath. USE 1 VIAL IN NEBULIZER EVERY 6 HOURS AS NEEDED   albuterol (VENTOLIN HFA) 108 (90 Base) MCG/ACT inhaler INHALE 2 PUFFS BY MOUTH EVERY 4 HOURS AS NEEDED FOR WHEEZING AND FOR SHORTNESS OF BREATH   ALPRAZolam (XANAX) 0.5 MG tablet Take 1 tablet by mouth twice daily as needed for anxiety   aspirin EC 81 MG tablet Take 81 mg by mouth daily.   azithromycin (ZITHROMAX) 250 MG tablet Take one tab a day for 10 days for uri   Blood Glucose Monitoring Suppl (ONETOUCH VERIO) w/Device KIT Use as directed diag   Budeson-Glycopyrrol-Formoterol (BREZTRI AEROSPHERE) 160-9-4.8 MCG/ACT AERO Inhale 2 puffs into the lungs 2 (two) times daily.   carboxymethylcellul-glycerin (LUBRICANT DROPS/DUAL-ACTION) 0.5-0.9 % ophthalmic solution Place 1-2 drops into both eyes 3 (three) times daily as needed for dry eyes.   diltiazem (CARDIZEM CD) 180 MG 24 hr capsule Take 1 capsule by mouth twice daily   doxycycline (VIBRA-TABS) 100 MG tablet Take 1 tablet (100 mg total) by mouth 2 (two) times daily. With food   fluticasone (FLONASE) 50 MCG/ACT nasal spray Place 1 spray into both nostrils in the morning and at bedtime.   furosemide (LASIX) 40 MG tablet Take 1 tablet (40 mg total) by mouth 2 (two) times daily as needed (fluid retention.).   glucose blood (ONETOUCH VERIO) test strip Use 1 strip to check glucose 3 times a daily as directed   insulin aspart (NOVOLOG FLEXPEN) 100 UNIT/ML FlexPen Inject 2-10 Units into the skin as directed. Sliding scale -  use as directed QAC per sliding scale instructions. Max daily dose is 25 units in 24 hours. E11.65   insulin glargine (LANTUS SOLOSTAR) 100 UNIT/ML Solostar Pen Inject 10 Units into the skin at bedtime.   ipratropium-albuterol (DUONEB) 0.5-2.5 (3) MG/3ML SOLN Inhale 3 mLs into the lungs every 4 (four) hours as needed (wheezing/shortness of breath). 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   montelukast (SINGULAIR) 10 MG tablet Take 1 tablet (10 mg total) by mouth at bedtime.   Multiple Vitamin (MULTIVITAMIN WITH MINERALS) TABS tablet Take 1 tablet by mouth daily.   pantoprazole (PROTONIX) 40 MG tablet Take 1 tablet (40 mg total) by mouth 2 (two) times daily.   potassium chloride (KLOR-CON) 10 MEQ tablet Take 1 tablet (10 mEq total) by mouth 2 (two) times daily.   predniSONE (DELTASONE) 20 MG tablet Take 1 tab po 3 x day for 3 days then take 1 tab po 2 x a day for 3 days and then take 1 tab po daily for 3 days. Kep extra on hand in case another taper is needed at a later date.   theophylline (THEODUR) 300 MG 12 hr tablet Take 1 tablet by mouth twice daily   No facility-administered encounter medications on file as of 03/16/2021.   New patient assistance application form filled out to [facility] for [employer]. Waiting for patient  and provider to complete and sign documentation. Called patient to inquire if they wanted the application mailed to them or if they wanted to come into the office. Patient is required to sign application and to bring/have proof of income.She stated she would be willing to come into office to bring proof of income and sign application once paper work came in the mail she would like it mailed to their residence address [place] [address].  Charlann Lange, [facility] Clinical Pharmacist Assistant [phone]

## 2021-03-16 NOTE — Telephone Encounter (Signed)
Called and left voicemail for patient to call back to be scheduled. 

## 2021-03-16 NOTE — Progress Notes (Signed)
Initial Pharmacist Visit   Cassie Ross, [place] [id number] 33 years, Female  DOB: 01-Aug-1960  M: [phone]  Clinical Summary Situation:: Patient presents in office with her husband for Initial CCM Visit. Background:: Patient's primary concerns are keeping her sugars under control, weight loss, and medication costs. Assessment:: -Patient's asthma has greatly improved since starting Breztri, will pursue PAP to aid with expense. -Patient's sugar has been well-controlled with diet modifications. 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 Staying on PPI long term could exacerbate issue. Provided patient with taper down regimen for Pantoprazole as tolerated. -Patient may benefit from addition of low dose ACE/ARB for kidney protection with diabetes Recommendations::  -Recommended patient check blood pressure once to twice weekly and keep a log -Taper off pantoprazole as tolerated. If not tolerated, may resume Lawyer:: CCM Services:  This encounter meets complex CCM services and moderate to high medical decision making.   Visit Details Patient scheduled for CCM visit with the clinical pharmacist.  Patient is referred for CCM by their PCP and CPP is under general PCP supervision.: At least 2 of these conditions are expected to last 12 months or longer and patient is at significant risk for acute exacerbations and/or functional decline.  Patient has consented to participation in [facility] program. Visit Type: Clinic visit Date of Visit: 03/16/2021  Pre-Call Questions  Completed by Charlann Lange on 03/15/2021 04:35 PM  Are you able to connect with Patient: Yes Visit Type: Clinic visit Confirm visit date/time: 03/16/21 9:30 AM Have you seen any other providers since your last visit?: No Any changes in your medicines or health?: No Any side effects from any medications?: No Do you have any symptoms or problems not managed by your  medications?: No What are your top 2 health concerns right now?: Patient stated her diabetic levels. What are your top 2 medication concerns right now?: Cost Has your Dr asked that you take BP, BG or special diet at home?: Monitor BP, Monitor BG, Exercise, Specific diet Details: Diabetic diet Do you get any type of exercise on a regular basis?: No What are your top 1 or 2 goals for your health?: Patient stated to be able to loose weight and exercise more. What are you doing to try to reach these goals?: Monitoring BG, Taking medications What, if any, problems do you have getting your medications from the pharmacy?: Financial barriers Details: Patient stated her medication called Theophylline is costing her too much money and she would like to know if it is able to be changed to what her insurance is approving or to get some assistance on this medication. Is there anything else that you would like to discuss during the appointment?: No  Patient Chart Prep   Chronic Conditions Patient's Chronic Conditions: Hypertension (HTN), Asthma, Gastroesophageal Reflux Disease (GERD), Diabetes (DM), Osteoarthritis, Anxiety  Doctor and Hospital Visits Were there PCP Visits in last 6 months?: Yes Visit #1: 10/12/20 Jonetta Osgood, NP. For encounter for general adult exam. STARTED BREZTRI AEROSPHERE 160-9-4.8 MCG/ACT . STOPPED Tiotropium.  PER NOTE: Pneumococcal vaccine injection. Visit #2: 11/21/20 Jonetta Osgood, NP. For severe persistent asthma. No medication changes. Visit #3: 11/30/20 Mylinda Latina, PA-C. For Acute COVID. STARTED PAXLOVID Take 3 tablets by mouth 2 (two) times daily for 5 days. Visit #4: 12/12/20 Jonetta Osgood, NP. For acute upper respiratory infection. STARTED Azithromycin 250 mg one tablet daily for 10 days, Doxycycline 100 mg 2 times daily, and Prednisone 20 mg dose  pack. Visit #5: 02/20/21 Jonetta Osgood, NP. For uncontrolled type 2 diabetes mellitus. STOPPED  Oseltamivir. Were there Specialist Visits in last 6 months?: No Was there a Hospital Visit in last 30 days?: No Were there other Hospital Visits in last 6 months?: No  Medication Information Are there any Medication discrepancies?: No Are there any Medication adherence gaps (beyond 5 days past due)?: No Medication adherence rates for the STAR rating drugs: None. List Patient's current Care Gaps: Need Eye Exam Documented in EHR or by Claim  Disease Assessments  Subjective Information Current BP: 142/79 Current HR: 95 taken on: 02/20/2021 Previous BP: 135/85 Previous HR: 85 taken on: 12/11/2020 Weight: 192 BMI: 34.01 Last GFR: 99 taken on: 10/31/2020 Why did the patient present?: CCM Initial Visit Marital status?: Married Details: Been together 74 years Education level?: [employer] graduate Retired? Previous work?: Been on disability for many years due to severe asthma What does the patient do during the day?: Relaxes at home with husband, they like to go to [place] Who does the patient spend their time with and what do they do?: With her husband, they like to go camping in the mountains Lifestyle habits such as diet and exercise?: No particular exercise due to asthma but reports that has greatly improved. Tries to watch carbs in diet Alcohol, tobacco, and illicit drug usage?: No What is the patient's sleep pattern?: No sleep issues How many hours per night does patient typically sleep?: 6+ Patient pleased with health care they are receiving?: Yes Family, occupational, and living circumstances relevant to overall health?: Patient and husband both want to eat healthy and try to lose weight Factors that may affect medication adherence?: Financial hardship (medication copays), Pill burden Name and location of Current pharmacy: [facility] Current Rx insurance plan: HTA Are meds synced by current pharmacy?: Yes Are meds delivered by current pharmacy?: No - delivery available  but patient prefers to not use Would patient benefit from direct intervention of clinical lead in dispensing process to optimize clinical outcomes?: Yes Are [facility] pharmacy services available where patient lives?: Yes Is patient disadvantaged to use [facility]?: No [facility] reviewed with patient and patient wishes to change pharmacy?: No Select reason patient declined to change pharmacies: Other Details: Currently using GoodRx for theophylline Does patient experience delays in picking up medications due to transportation concerns (getting to pharmacy)?: No Any additional demeanor/mood notes?: Patient presents with husband for a joint appt and both want to improve their health and try to eat healthy.  Hypertension (HTN) Assess this condition today?: Yes Is patient able to obtain BP reading today?: No Goal: <130/80 mmHG Hypertension Stage: Stage 2 (SBP >140 or DBP > 90) Is Patient checking BP at home?: No How often does patient miss taking their blood pressure medications?: None Has patient experienced hypotension, dizziness, falls or bradycardia?: No Check present secondary causes (below) for HTN: Obesity BP RPM device: Does patient qualify?: No We discussed: DASH diet:  following a diet emphasizing fruits and vegetables and low-fat dairy products along with whole grains, fish, poultry, and nuts. Reducing red meats and sugars., Targeting 150 minutes of aerobic activity per week, Reducing the amount of salt intake to 1500mg /per day., Proper Home BP Measurement Assessment:: Uncontrolled Drug: Diltiazem CD 180mg  1 capsule twice daily Assessment: Appropriate, Effective, Safe, Accessible Drug: Furosemide 40mg  1 tablet twice daily Assessment: Appropriate, Effective, Safe, Accessible Additional Info: Recommended patient begin to check blood pressure once to twice weekly at home and keep a log.  Patient may benefit from addition of ACE/ARB for kidney protection with  diabetes. Will monitor for now Plan to (other): Continue current medication therapy HC Follow up: 2 months Pharmacist Follow up: 5 months  Diabetes (DM) Current A1C: 7.0 taken on: 02/20/2021 Previous A1C: 7.3 taken on: 11/20/2020 Type: 2 The current microalbumin ratio is: 80 tested on: 10/12/2020 Last DM Foot Exam on: 10/12/2019 Last DM Eye Exam on: 12/12/2019 Assess this condition today?: Yes Goal A1C: < 7.0 % Type: 2 Is Patient taking statin medication?: No Reason patient is not taking statin(s): Will discuss at next appt Is patient taking ACEi / ARB?: No Reason patient is not taking ACEi / ARB: Unclear DM RPM device: Does patient qualify?: No Patient checking Blood Sugar at home?: Yes Does patient use a Continuous Glucose Monitoring (CGM) device?: No How often does patient check Blood Sugars?: Four times a day, fasting and after meals. Range is normally 99-120 Has patient experienced any hypoglycemic episodes?: No Diet recall discussed: Yes Breakfast: Cereal, Oatmeal, eggs and bacon sometimes Lunch: Sandwich on whole wheat bread Supper: Meat and Veggies Snacks: yogurt, nuts, sometimes potato chips Beverages: Coffee, tea with stevia, water We discussed: How to recognize and treat signs of hypoglycemia., Diet and exercise extensively, Modifying lifestyle, including to participate in moderate physical activity (e.g., walking) at least 150 minutes per week. Assessment:: Controlled Drug: Novolog Max 25 units daily Assessment: Appropriate, Effective, Safe, Accessible Drug: Lantus 10units at bedtime Assessment: Appropriate, Effective, Safe, Accessible Additional Info: With diet changes, patient has been able to come off the lantus and reports controlled sugars with just Novolog Plan to (other): Continue Novolog medication therapy HC Follow up: 2 months Pharmacist Follow up: 5 months  Anxiety Assess this condition today?: Yes Completing the GAD-7 Questionnaire today?: No In  your opinion, how do you feel your anxiety symptoms have been controlled over the past 3 months?: Stable / stayed the same Have you ever had a panic attack?: No Assessment:: Controlled Drug: Alprazolam 0.5mg  1 tablet twice a day as needed Assessment: Appropriate, Effective, Safe, Accessible Plan to (other): Continue current medication therapy HC Follow up: 2 months Pharmacist Follow up: 5 months  GERD Assess this condition today?: Yes How frequently do you have symptoms of GERD or reflux?: occasionally When does patient experience reflux symptoms?: After meals Does patient experience difficulty or pain with swallowing?: No What OTC medications have you tried and what was the response?: Not discussed Is the patient a smoker or exposed to second-hand smoke?: No How long have you taken prescription medications for GERD?: 5 years What potential triggering foods have you identified and tried to limit intake in your diet?: spicy foods Have these changes been successful in reducing reflux symptoms?: Yes We discussed: Identifying and avoiding / limiting trigger foods, Eating smaller, more frequent meals, Remaining upright for 2-3 hours after eating and avoiding eating for 2-3 hours before bedtime Assessment:: Controlled Drug: Pantoprazole 40mg  twice daily Assessment: Appropriate, Effective, Safe, Accessible Additional Info: Informed patient of risk of longterm PPI use with regard to bone density. Patient already has been informed she has brittle bones due to longterm Prednisone use. Educated patient on how to properly taper Pantoprazole if tolerated. Patient will begin by dropping down to 1 tab once daily for 1 to 2 weeks. Plan to (other): Taper Pantoprazole as tolerated HC Follow up: 2 months Pharmacist Follow up: 5 months  Asthma Pre-bronchodilator FEV1: 37% taken on: 04/24/2020 Current Eosinophils: 7 taken on: 10/31/2020 Assess this condition today?: Yes  Asthma Symptom Classification:  Severe Persistent Exacerbations in past year without hospitalization: No Is patient currently Smoking or Vaping?: No Did patient smoke/vape before?: No Frequency of SABA use: Infrequently Influenza vaccine: No Prevnar vaccine: No Pneumovax vaccine: No Comorbidities present that may worsen asthma control: GERD, Obesity, Anxiety We discussed: Proper inhaler technique Assessment:: Controlled Drug: Breztri 2 puffs twice daily Assessment: Appropriate, Effective, Safe, Query Accessibility Drug: Theophylline 300mg  ER 1 every 12 hours Assessment: Appropriate, Effective, Safe, Accessible Additional Info: Patient theophylline is not covered by insurance but is currently $20 on GoodRx at [facility]. Will pursue PAP for Breztri Plan to (other): Continue current medication therapy HC Follow up: 2 months Pharmacist Follow up: 5 months   Exercise, Diet and Non-Drug Coordination Needs Additional exercise counseling points. We discussed: targeting at least 150 minutes per week of moderate-intensity aerobic exercise. Additional diet counseling points. We discussed: key components of a low-carb eating plan, key components of the DASH diet, aiming to consume at least 8 cups of water day Discussed Non-Drug Care Coordination Needs: Yes Does Patient have Medication financial barriers?: Yes Patient has insurance  Designer, fashion/clothing): HTA and has trouble affording medications  (list number): Breztri Goal: (pharmacist to fill in plan to resolve) i.e. change medications, samples, PAP.: Pursue PAP Patient meets income/out of pocket spend criteria for this medications patient assistance program. Reviewed application process, patient will provide proof of income, out of pocket spend report, and will sign application.  Will collaborate with PCP, and will submit for patient.: Yes [facility] Health-Related Social Needs Screening Tool -  SDOH  What is your living situation today? (ref #1): I have a  steady place to live Think about the place you live. Do you have problems with any of the following? (ref #2): None of the above Within the past 12 months, you worried that your food would run out before you got money to buy more (ref #3): Never true Within the past 12 months, the food you bought just didn't last and you didn't have money to get more (ref #4): Never true In the past 12 months, has lack of reliable transportation kept you from medical appointments, meetings, work or from getting things needed for daily living? (ref #5): No In the past 12 months, has the electric, gas, oil, or water company threatened to shut off services in your home? (ref #6): No How often does anyone, including family and friends, physically hurt you? (ref #7): Never (1) How often does anyone, including family and friends, insult or talk down to you? (ref #8): Never (1) How often does anyone, including friends and family, threaten you with harm? (ref #9): Never (1) How often does anyone, including family and friends, scream or curse at you? (ref #10): Never (1)  How to Take Your Blood Pressure Blood pressure measures how strongly your blood is pressing against the walls of your arteries. Arteries are blood vessels that carry blood from your heart throughout your body. You can take your blood pressure at home with a machine. You may need to check your blood pressure at home: To check if you have high blood pressure (hypertension). To check your blood pressure over time. To make sure your blood pressure medicine is working. Supplies needed: Blood pressure machine, or monitor. Dining room chair to sit in. Table or desk. Small notebook. Pencil or pen. How to prepare Avoid these things for 30 minutes before checking your blood pressure: Having drinks with caffeine in them, such as coffee  or tea. Drinking alcohol. Eating. Smoking. Exercising. Do these things five minutes before checking your blood  pressure: Go to the bathroom and pee (urinate). Sit in a dining chair. Do not sit on a soft couch or an armchair. Be quiet. Do not talk. How to take your blood pressure Follow the instructions that came with your machine. If you have a digital blood pressure monitor, these may be the instructions: Sit up straight. Place your feet on the floor. Do not cross your ankles or legs. Rest your left arm at the level of your heart. You may rest it on a table, desk, or chair. Pull up your shirt sleeve. Wrap the blood pressure cuff around the upper part of your left arm. The cuff should be 1 inch (2.5 cm) above your elbow. It is best to wrap the cuff around bare skin. Fit the cuff snugly around your arm. You should be able to place only one finger between the cuff and your arm. Place the cord so that it rests in the bend of your elbow. Press the power button. Sit quietly while the cuff fills with air and loses air. Write down the numbers on the screen. Wait 2-3 minutes and then repeat steps 1-10. What do the numbers mean? Two numbers make up your blood pressure. The first number is called systolic pressure. The second is called diastolic pressure. An example of a blood pressure reading is "120 over 80" (or 120/80). If you are an adult and do not have a medical condition, use this guide to find out if your blood pressure is normal: Normal First number: below 120. Second number: below 80. Elevated First number: 120-129. Second number: below 80. Hypertension stage 1 First number: 130-139. Second number: 80-89. Hypertension stage 2 First number: 140 or above. Second number: 44 or above. Your blood pressure is above normal even if only the first or only the second number is above normal. Follow these instructions at home: Medicines Take over-the-counter and prescription medicines only as told by your doctor. Tell your doctor if your medicine is causing side effects. General instructions Check  your blood pressure as often as your doctor tells you to. Check your blood pressure at the same time every day. Take your monitor to your next doctor's appointment. Your doctor will: Make sure you are using it correctly. Make sure it is working right. Understand what your blood pressure numbers should be. Keep all follow-up visits as told by your doctor. This is important. General tips You will need a blood pressure machine, or monitor. Your doctor can suggest a monitor. You can buy one at a drugstore or online. When choosing one: Choose one with an arm cuff. Choose one that wraps around your upper arm. Only one finger should fit between your arm and the cuff. Do not choose one that measures your blood pressure from your wrist or finger. Where to find more information American Heart Association: www.heart.org Contact a doctor if: Your blood pressure keeps being high. Your blood pressure is suddenly low. Get help right away if: Your first blood pressure number is higher than 180. Your second blood pressure number is higher than 120. Summary Check your blood pressure at the same time every day. Avoid caffeine, alcohol, smoking, and exercise for 30 minutes before checking your blood pressure. Make sure you understand what your blood pressure numbers should be. This information is not intended to replace advice given to you by your health care provider. Make sure you discuss  any questions you have with your health care provider.            Alena Bills Clinical Pharmacist [phone]

## 2021-03-20 ENCOUNTER — Ambulatory Visit: Payer: HMO | Admitting: Obstetrics & Gynecology

## 2021-03-20 ENCOUNTER — Other Ambulatory Visit: Payer: Self-pay

## 2021-03-20 ENCOUNTER — Encounter: Payer: Self-pay | Admitting: Obstetrics & Gynecology

## 2021-03-20 VITALS — BP 120/80 | Ht 63.0 in | Wt 203.0 lb

## 2021-03-20 DIAGNOSIS — N362 Urethral caruncle: Secondary | ICD-10-CM

## 2021-03-20 NOTE — Progress Notes (Signed)
HPI:      [name] is a 61 y.o. [phone] who LMP was No LMP recorded. Patient is postmenopausal., presents today for a problem visit.  She complains of 1-2 weeks of intermittent (most days) bleeding noted when wiping after voids.  She had TVH w sling one year ago.  She has done well,no pain or bleeding.  She has been sexually active since surgery, but in the last 3 weeks.  No other trauma.  No dysuria.    PMHx: She  has a past medical history of Anemia, Asthma, CHF (congestive heart failure) ([place]), Chicken pox, Complication of anesthesia (1998), COPD (chronic obstructive pulmonary disease) ([place]), Diabetes ([place]), Difficult intubation, DVT (deep venous thrombosis) ([place]), GERD (gastroesophageal reflux disease), Hernia, hiatal (2007), HLD (hyperlipidemia), Hypertension, Hypertension, Pneumonia, and Seasonal allergies. Also,  has a past surgical history that includes Hernia repair; Cholecystectomy; Nasal sinus surgery; Esophageal dilation; cataract surgery; hiatial hernia repair; Colonoscopy with propofol (N/A, 01/06/2018); Esophagogastroduodenoscopy (egd) with propofol (N/A, 01/06/2018); Eye surgery (Bilateral, 2014); Vaginal hysterectomy (Bilateral, 05/23/2020); Cystocele repair (N/A, 05/23/2020); Bladder suspension (N/A, 05/23/2020); Pubovaginal sling (N/A, 05/23/2020); Cystoscopy (N/A, 05/23/2020); and Rectocele repair (05/23/2020)., family history includes Colon cancer in her father; Colon polyps in her sister; Diabetes in her sister; Hypertension in her mother; Lung cancer in her mother.,  reports that she has never smoked. She has never used smokeless tobacco. She reports current alcohol use. She reports that she does not use drugs.  She  Current Outpatient Medications:    acetaminophen (TYLENOL) 500 MG tablet, Take 1,000 mg by mouth every 8 (eight) hours as needed for moderate pain. , Disp: , Rfl:    albuterol (PROVENTIL) (2.5 MG/3ML) 0.083% nebulizer solution, Take 3 mLs (2.5 mg total) by  nebulization every 6 (six) hours as needed for shortness of breath. USE 1 VIAL IN NEBULIZER EVERY 6 HOURS AS NEEDED, Disp: 300 mL, Rfl: 1   albuterol (VENTOLIN HFA) 108 (90 Base) MCG/ACT inhaler, INHALE 2 PUFFS BY MOUTH EVERY 4 HOURS AS NEEDED FOR WHEEZING AND FOR SHORTNESS OF BREATH, Disp: 9 g, Rfl: 3   ALPRAZolam (XANAX) 0.5 MG tablet, Take 1 tablet by mouth twice daily as needed for anxiety, Disp: 60 tablet, Rfl: 0   aspirin EC 81 MG tablet, Take 81 mg by mouth daily., Disp: , Rfl:    azithromycin (ZITHROMAX) 250 MG tablet, Take one tab a day for 10 days for uri, Disp: 10 tablet, Rfl: 2   Blood Glucose Monitoring Suppl (ONETOUCH VERIO) w/Device KIT, Use as directed diag, Disp: 1 kit, Rfl: 0   Budeson-Glycopyrrol-Formoterol (BREZTRI AEROSPHERE) 160-9-4.8 MCG/ACT AERO, Inhale 2 puffs into the lungs 2 (two) times daily., Disp: 10.7 g, Rfl: 11   carboxymethylcellul-glycerin (LUBRICANT DROPS/DUAL-ACTION) 0.5-0.9 % ophthalmic solution, Place 1-2 drops into both eyes 3 (three) times daily as needed for dry eyes., Disp: , Rfl:    diltiazem (CARDIZEM CD) 180 MG 24 hr capsule, Take 1 capsule by mouth twice daily, Disp: 180 capsule, Rfl: 0   doxycycline (VIBRA-TABS) 100 MG tablet, Take 1 tablet (100 mg total) by mouth 2 (two) times daily. With food, Disp: 42 tablet, Rfl: 2   fluticasone (FLONASE) 50 MCG/ACT nasal spray, Place 1 spray into both nostrils in the morning and at bedtime., Disp: , Rfl:    furosemide (LASIX) 40 MG tablet, Take 1 tablet (40 mg total) by mouth 2 (two) times daily as needed (fluid retention.)., Disp: 30 tablet, Rfl: 2   glucose blood (ONETOUCH VERIO) test strip, Use  1 strip to check glucose 3 times a daily as directed, Disp: 100 each, Rfl: 12   insulin aspart (NOVOLOG FLEXPEN) 100 UNIT/ML FlexPen, Inject 2-10 Units into the skin as directed. Sliding scale - use as directed QAC per sliding scale instructions. Max daily dose is 25 units in 24 hours. E11.65, Disp: 15 mL, Rfl: 1   insulin  glargine (LANTUS SOLOSTAR) 100 UNIT/ML Solostar Pen, Inject 10 Units into the skin at bedtime., Disp: 15 mL, Rfl: 1   ipratropium-albuterol (DUONEB) 0.5-2.5 (3) MG/3ML SOLN, Inhale 3 mLs into the lungs every 4 (four) hours as needed (wheezing/shortness of breath). J44.0, Disp: 540 mL, Rfl: 3   LEADER UNIFINE PENTIPS 32G X 4 MM MISC, USE AS DIRECTED WITH INSULIN, Disp: 100 each, Rfl: 3   loratadine (CLARITIN) 10 MG tablet, Take 10 mg by mouth daily., Disp: , Rfl:    montelukast (SINGULAIR) 10 MG tablet, Take 1 tablet (10 mg total) by mouth at bedtime., Disp: 90 tablet, Rfl: 1   Multiple Vitamin (MULTIVITAMIN WITH MINERALS) TABS tablet, Take 1 tablet by mouth daily., Disp: , Rfl:    pantoprazole (PROTONIX) 40 MG tablet, Take 1 tablet (40 mg total) by mouth 2 (two) times daily., Disp: 60 tablet, Rfl: 2   potassium chloride (KLOR-CON) 10 MEQ tablet, Take 1 tablet (10 mEq total) by mouth 2 (two) times daily., Disp: 180 tablet, Rfl: 5   predniSONE (DELTASONE) 20 MG tablet, Take 1 tab po 3 x day for 3 days then take 1 tab po 2 x a day for 3 days and then take 1 tab po daily for 3 days. Kep extra on hand in case another taper is needed at a later date., Disp: 60 tablet, Rfl: 1   theophylline (THEODUR) 300 MG 12 hr tablet, Take 1 tablet by mouth twice daily, Disp: 180 tablet, Rfl: 1  Also, is allergic to shellfish allergy, sunflower oil, levaquin [levofloxacin in d5w], levaquin [levofloxacin], and penicillins.  Review of Systems  Constitutional:  Negative for chills, fever and malaise/fatigue.  HENT:  Negative for congestion, sinus pain and sore throat.   Eyes:  Negative for blurred vision and pain.  Respiratory:  Negative for cough and wheezing.   Cardiovascular:  Negative for chest pain and leg swelling.  Gastrointestinal:  Negative for abdominal pain, constipation, diarrhea, heartburn, nausea and vomiting.  Genitourinary:  Positive for frequency. Negative for dysuria, hematuria and urgency.   Musculoskeletal:  Negative for back pain, joint pain, myalgias and neck pain.  Skin:  Negative for itching and rash.  Neurological:  Negative for dizziness, tremors and weakness.  Endo/Heme/Allergies:  Does not bruise/bleed easily.  Psychiatric/Behavioral:  Negative for depression. The patient is not nervous/anxious and does not have insomnia.    Objective: BP 120/80    Ht _0  (1.6 m)    Wt 203 lb (92.1 kg)    BMI 35.96 kg/m  Physical Exam Constitutional:      General: She is not in acute distress.    Appearance: She is well-developed.  Genitourinary:     Bladder normal.     Vaginal cuff intact.    Perineal sutures intact.    No vaginal erythema or bleeding.     Posterior vaginal prolapse present.    Moderate vaginal atrophy present.     Right Adnexa: not tender and no mass present.    Left Adnexa: not tender and no mass present.    Cervix is absent.     Uterus is absent.  Urethra exam comments: Very small polyp noted at urethral meatus, bleeds to touch.     Pelvic exam was performed with patient in the lithotomy position.  HENT:     Head: Normocephalic and atraumatic.     Nose: Nose normal.  Abdominal:     General: There is no distension.     Palpations: Abdomen is soft.     Tenderness: There is no abdominal tenderness.  Musculoskeletal:        General: Normal range of motion.  Neurological:     Mental Status: She is alert and oriented to person, place, and time.     Cranial Nerves: No cranial nerve deficit.  Skin:    General: Skin is warm and dry.  Psychiatric:        Attention and Perception: Attention normal.        Mood and Affect: Mood normal.        Speech: Speech normal.        Behavior: Behavior normal.        Cognition and Memory: Cognition normal.        Judgment: Judgment normal.    ASSESSMENT/PLAN:     ICD-10-CM   1. Urethral polyp  N36.2     Monitor for bleeding or interruption in urine stream Urology referral would be next option to see  about removing polyp  Barnett Applebaum, MD, [facility] 03/20/2021  10:30 AM

## 2021-03-23 ENCOUNTER — Telehealth: Payer: Self-pay | Admitting: Student-PharmD

## 2021-03-23 NOTE — Progress Notes (Signed)
Chronic Care Management Pharmacy Assistant   Name: Cassie Ross  MRN: [id number] DOB: [birth year]  Reason for Encounter: CCM Care Plan  Medications: Outpatient Encounter Medications as of 03/23/2021  Medication Sig   acetaminophen (TYLENOL) 500 MG tablet Take 1,000 mg by mouth every 8 (eight) hours as needed for moderate pain.    albuterol (PROVENTIL) (2.5 MG/3ML) 0.083% nebulizer solution Take 3 mLs (2.5 mg total) by nebulization every 6 (six) hours as needed for shortness of breath. USE 1 VIAL IN NEBULIZER EVERY 6 HOURS AS NEEDED   albuterol (VENTOLIN HFA) 108 (90 Base) MCG/ACT inhaler INHALE 2 PUFFS BY MOUTH EVERY 4 HOURS AS NEEDED FOR WHEEZING AND FOR SHORTNESS OF BREATH   ALPRAZolam (XANAX) 0.5 MG tablet Take 1 tablet by mouth twice daily as needed for anxiety   aspirin EC 81 MG tablet Take 81 mg by mouth daily.   azithromycin (ZITHROMAX) 250 MG tablet Take one tab a day for 10 days for uri   Blood Glucose Monitoring Suppl (ONETOUCH VERIO) w/Device KIT Use as directed diag   Budeson-Glycopyrrol-Formoterol (BREZTRI AEROSPHERE) 160-9-4.8 MCG/ACT AERO Inhale 2 puffs into the lungs 2 (two) times daily.   carboxymethylcellul-glycerin (LUBRICANT DROPS/DUAL-ACTION) 0.5-0.9 % ophthalmic solution Place 1-2 drops into both eyes 3 (three) times daily as needed for dry eyes.   diltiazem (CARDIZEM CD) 180 MG 24 hr capsule Take 1 capsule by mouth twice daily   doxycycline (VIBRA-TABS) 100 MG tablet Take 1 tablet (100 mg total) by mouth 2 (two) times daily. With food   fluticasone (FLONASE) 50 MCG/ACT nasal spray Place 1 spray into both nostrils in the morning and at bedtime.   furosemide (LASIX) 40 MG tablet Take 1 tablet (40 mg total) by mouth 2 (two) times daily as needed (fluid retention.).   glucose blood (ONETOUCH VERIO) test strip Use 1 strip to check glucose 3 times a daily as directed   insulin aspart (NOVOLOG FLEXPEN) 100 UNIT/ML FlexPen Inject 2-10 Units into the skin as directed. Sliding  scale - use as directed QAC per sliding scale instructions. Max daily dose is 25 units in 24 hours. E11.65   insulin glargine (LANTUS SOLOSTAR) 100 UNIT/ML Solostar Pen Inject 10 Units into the skin at bedtime.   ipratropium-albuterol (DUONEB) 0.5-2.5 (3) MG/3ML SOLN Inhale 3 mLs into the lungs every 4 (four) hours as needed (wheezing/shortness of breath). 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   montelukast (SINGULAIR) 10 MG tablet Take 1 tablet (10 mg total) by mouth at bedtime.   Multiple Vitamin (MULTIVITAMIN WITH MINERALS) TABS tablet Take 1 tablet by mouth daily.   pantoprazole (PROTONIX) 40 MG tablet Take 1 tablet (40 mg total) by mouth 2 (two) times daily.   potassium chloride (KLOR-CON) 10 MEQ tablet Take 1 tablet (10 mEq total) by mouth 2 (two) times daily.   predniSONE (DELTASONE) 20 MG tablet Take 1 tab po 3 x day for 3 days then take 1 tab po 2 x a day for 3 days and then take 1 tab po daily for 3 days. Kep extra on hand in case another taper is needed at a later date.   theophylline (THEODUR) 300 MG 12 hr tablet Take 1 tablet by mouth twice daily   No facility-administered encounter medications on file as of 03/23/2021.   Reviewed the patients initial visit reinsured it was completed per the pharmacist Levada Dy  [place] request. Printed the CCM care plan. Mailed the patient CCM care plan to their most recent address on file.  Charlann Lange, [place] Clinical Pharmacist Assistant [phone]

## 2021-03-24 DIAGNOSIS — J449 Chronic obstructive pulmonary disease, unspecified: Secondary | ICD-10-CM | POA: Diagnosis not present

## 2021-03-25 ENCOUNTER — Encounter: Payer: Self-pay | Admitting: Nurse Practitioner

## 2021-03-26 ENCOUNTER — Other Ambulatory Visit: Payer: Self-pay | Admitting: Nurse Practitioner

## 2021-03-26 DIAGNOSIS — F411 Generalized anxiety disorder: Secondary | ICD-10-CM

## 2021-03-27 NOTE — Procedures (Signed)
[facility] [address] [place], [address]    Complete Pulmonary Function Testing Interpretation:  FINDINGS:  The forced vital capacity is mildly decreased.  FEV1 is 1.48 L which is 62% of predicted and is moderately decreased.  FEV1 FVC ratio is mildly decreased.  Postbronchodilator there was no significant change in the FEV1 clinical improvement may still occur in the absence of spirometric improvement.  Total lung capacity is normal.  DLCO was normal.  IMPRESSION:  This pulmonary function study is suggestive of moderate obstructive lung disease clinical correlation is recommended  Allyne Gee, MD [facility] Pulmonary Critical Care Medicine Sleep Medicine

## 2021-03-27 NOTE — Telephone Encounter (Signed)
Med sent to pharmacy.

## 2021-04-08 DIAGNOSIS — I10 Essential (primary) hypertension: Secondary | ICD-10-CM | POA: Diagnosis not present

## 2021-04-08 DIAGNOSIS — E1165 Type 2 diabetes mellitus with hyperglycemia: Secondary | ICD-10-CM | POA: Diagnosis not present

## 2021-04-16 ENCOUNTER — Other Ambulatory Visit: Payer: Self-pay

## 2021-04-16 ENCOUNTER — Ambulatory Visit (INDEPENDENT_AMBULATORY_CARE_PROVIDER_SITE_OTHER): Payer: HMO | Admitting: Physician Assistant

## 2021-04-16 ENCOUNTER — Encounter: Payer: Self-pay | Admitting: Physician Assistant

## 2021-04-16 DIAGNOSIS — L03116 Cellulitis of left lower limb: Secondary | ICD-10-CM

## 2021-04-16 DIAGNOSIS — J069 Acute upper respiratory infection, unspecified: Secondary | ICD-10-CM

## 2021-04-16 MED ORDER — DOXYCYCLINE HYCLATE 100 MG PO TABS: 100.0000 mg | ORAL_TABLET | Freq: Two times a day (BID) | ORAL | 0 refills | Status: DC

## 2021-04-16 NOTE — Progress Notes (Signed)
[facility] [place], [place] [address]  Internal MEDICINE  Office Visit Note  Patient Name: Cassie Ross  [id number]  [id number]  Date of Service: 04/18/2021  Chief Complaint  Patient presents with   Leg Problem    Left leg has a red spot, has tried 2 rounds of doxycycline - not itchy, it is swollen and sore     HPI Pt is here for a sick visit. -She has a red rash on her lower left leg that started a few weeks ago and tender to the touch. Took Doxycyline for 7 days, and then stopped and got better but then got worse again and did another 7 day course. Had on hand at home in case of lung exacerbation. -She reports a hx of similar spot on leg in the past that was cellulitis and treated with ABX. She states she may have scratched her leg due to dry skin and may have led to this as no open wound visualized now. -reports the area is much smaller than it was previously and that the doxycycline was helping but once she stopped taking it would start to come back again.  -denies any bites or other injuries  Current Medication:  Outpatient Encounter Medications as of 04/16/2021  Medication Sig   acetaminophen (TYLENOL) 500 MG tablet Take 1,000 mg by mouth every 8 (eight) hours as needed for moderate pain.    albuterol (PROVENTIL) (2.5 MG/3ML) 0.083% nebulizer solution Take 3 mLs (2.5 mg total) by nebulization every 6 (six) hours as needed for shortness of breath. USE 1 VIAL IN NEBULIZER EVERY 6 HOURS AS NEEDED   albuterol (VENTOLIN HFA) 108 (90 Base) MCG/ACT inhaler INHALE 2 PUFFS BY MOUTH EVERY 4 HOURS AS NEEDED FOR WHEEZING AND FOR SHORTNESS OF BREATH   ALPRAZolam (XANAX) 0.5 MG tablet Take 1 tablet by mouth twice daily as needed for anxiety   aspirin EC 81 MG tablet Take 81 mg by mouth daily.   azithromycin (ZITHROMAX) 250 MG tablet Take one tab a day for 10 days for uri   Blood Glucose Monitoring Suppl (ONETOUCH VERIO) w/Device KIT Use as directed diag    Budeson-Glycopyrrol-Formoterol (BREZTRI AEROSPHERE) 160-9-4.8 MCG/ACT AERO Inhale 2 puffs into the lungs 2 (two) times daily.   carboxymethylcellul-glycerin (LUBRICANT DROPS/DUAL-ACTION) 0.5-0.9 % ophthalmic solution Place 1-2 drops into both eyes 3 (three) times daily as needed for dry eyes.   diltiazem (CARDIZEM CD) 180 MG 24 hr capsule Take 1 capsule by mouth twice daily   doxycycline (VIBRA-TABS) 100 MG tablet Take 1 tablet (100 mg total) by mouth 2 (two) times daily. With food   fluticasone (FLONASE) 50 MCG/ACT nasal spray Place 1 spray into both nostrils in the morning and at bedtime.   furosemide (LASIX) 40 MG tablet Take 1 tablet (40 mg total) by mouth 2 (two) times daily as needed (fluid retention.).   glucose blood (ONETOUCH VERIO) test strip Use 1 strip to check glucose 3 times a daily as directed   insulin aspart (NOVOLOG FLEXPEN) 100 UNIT/ML FlexPen Inject 2-10 Units into the skin as directed. Sliding scale - use as directed QAC per sliding scale instructions. Max daily dose is 25 units in 24 hours. E11.65   insulin glargine (LANTUS SOLOSTAR) 100 UNIT/ML Solostar Pen Inject 10 Units into the skin at bedtime.   ipratropium-albuterol (DUONEB) 0.5-2.5 (3) MG/3ML SOLN Inhale 3 mLs into the lungs every 4 (four) hours as needed (wheezing/shortness of breath). J44.0   LEADER UNIFINE PENTIPS 32G  X 4 MM MISC USE AS DIRECTED WITH INSULIN   loratadine (CLARITIN) 10 MG tablet Take 10 mg by mouth daily.   montelukast (SINGULAIR) 10 MG tablet Take 1 tablet (10 mg total) by mouth at bedtime.   Multiple Vitamin (MULTIVITAMIN WITH MINERALS) TABS tablet Take 1 tablet by mouth daily.   pantoprazole (PROTONIX) 40 MG tablet Take 1 tablet (40 mg total) by mouth 2 (two) times daily.   potassium chloride (KLOR-CON) 10 MEQ tablet Take 1 tablet (10 mEq total) by mouth 2 (two) times daily.   predniSONE (DELTASONE) 20 MG tablet Take 1 tab po 3 x day for 3 days then take 1 tab po 2 x a day for 3 days and then take 1  tab po daily for 3 days. Kep extra on hand in case another taper is needed at a later date.   theophylline (THEODUR) 300 MG 12 hr tablet Take 1 tablet by mouth twice daily   [DISCONTINUED] doxycycline (VIBRA-TABS) 100 MG tablet Take 1 tablet (100 mg total) by mouth 2 (two) times daily. With food   No facility-administered encounter medications on file as of 04/16/2021.      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  HENT:  Negative for congestion, mouth sores and postnasal drip.   Respiratory:  Negative for cough.   Cardiovascular:  Negative for chest pain.  Genitourinary:  Negative for flank pain.  Skin:  Positive for rash.       Red spot on lower left leg  Psychiatric/Behavioral: Negative.     Physical Exam Vitals and nursing note reviewed.  Constitutional:      General: She is not in acute distress.    Appearance: She is well-developed. She is obese. She is not diaphoretic.  HENT:     Head: Normocephalic and atraumatic.     Mouth/Throat:     Pharynx: No oropharyngeal exudate.  Eyes:     Pupils: Pupils are equal, round, and reactive to light.  Neck:     Thyroid: No thyromegaly.     Vascular: No JVD.     Trachea: No  tracheal deviation.  Cardiovascular:     Rate and Rhythm: Normal rate and regular rhythm.     Heart sounds: Normal heart sounds. No murmur heard.   No friction rub. No gallop.  Pulmonary:     Effort: Pulmonary effort is normal. No respiratory distress.  Breath sounds: No wheezing or rales.  Chest:     Chest wall: No tenderness.  Abdominal:     General: Bowel sounds are normal.     Palpations: Abdomen is soft.  Musculoskeletal:        General: Normal range of motion.     Cervical back: Normal range of motion and neck supple.  Lymphadenopathy:     Cervical: No cervical adenopathy.  Skin:    General: Skin is warm and dry.     Findings: Erythema present.     Comments: 2x2cm area of erythema that is warm to the touch. No open wound visible or any drainage  Neurological:     Mental Status: She is alert and oriented to person, place, and time.     Cranial Nerves: No cranial nerve deficit.  Psychiatric:        Behavior: Behavior normal.        Thought Content: Thought content normal.        Judgment: Judgment normal.      Assessment/Plan: 1. Cellulitis of left lower extremity Will start on longer course of doxycyline since this med is well tolerated and seemed to be helping. Will keep area clean and avoid scratching or touching area. Advised to go to ED if area is expanding or worsening. If not improving with this round may need dermatology for possible biopsy. - doxycycline (VIBRA-TABS) 100 MG tablet; Take 1 tablet (100 mg total) by mouth 2 (two) times daily. With food  Dispense: 28 tablet; Refill: 0   General Counseling: Yanessa verbalizes understanding of the findings of todays visit and agrees with plan of treatment. I have discussed any further diagnostic evaluation that may be needed or ordered today. We also reviewed her medications today. she has been encouraged to call the office with any questions or concerns that should arise related to todays  visit.    Counseling:    No orders of the defined types were placed in this encounter.   Meds ordered this encounter  Medications   doxycycline (VIBRA-TABS) 100 MG tablet    Sig: Take 1 tablet (100 mg total) by mouth 2 (two) times daily. With food    Dispense:  28 tablet    Refill:  0    Time spent:25 Minutes

## 2021-04-24 ENCOUNTER — Encounter: Payer: Self-pay | Admitting: Internal Medicine

## 2021-04-24 DIAGNOSIS — J449 Chronic obstructive pulmonary disease, unspecified: Secondary | ICD-10-CM | POA: Diagnosis not present

## 2021-04-30 ENCOUNTER — Other Ambulatory Visit: Payer: Self-pay | Admitting: Nurse Practitioner

## 2021-04-30 DIAGNOSIS — F411 Generalized anxiety disorder: Secondary | ICD-10-CM

## 2021-05-01 NOTE — Telephone Encounter (Signed)
Med sent to pharmacy.

## 2021-05-04 DIAGNOSIS — L309 Dermatitis, unspecified: Secondary | ICD-10-CM | POA: Diagnosis not present

## 2021-05-16 ENCOUNTER — Telehealth: Payer: Self-pay | Admitting: Student-PharmD

## 2021-05-16 ENCOUNTER — Ambulatory Visit: Payer: Self-pay

## 2021-05-16 NOTE — Progress Notes (Addendum)
General Review Call  ? ?Cassie Ross [id number] ?89 years, Female  DOB: [birth year]  M: [id number] ? ?General Review (HC) ?Completed by Charlann Lange on 05/16/2021 ? ?Chart Review ?What recent interventions/DTPs have been made by any provider to improve the patient's conditions in the last 3 months?:  ? ?Office Visit: ?04/16/21 Cassie Latina, PA-C For Cellulitis of left lower extremity. No medication changes. ? ?Any recent hospitalizations or ED visits since last visit with CPP?: No ? ?Adherence Review ?Adherence rates for STAR metric medications: None. ? ?Adherence rates for medications indicated for disease state being reviewed: None. ? ?Does the patient have >5 day gap between last estimated fill dates for any of the above medications?: No ? ?Disease State Questions ?Able to connect with the Patient?: Yes ? ?Did patient have any problems with their health recently?: No ? ?Did patient have any problems with their pharmacy?: No ? ?Does patient have any issues or side effects with their medications?: No ? ?Additional  information to pass to Patient's CPP?: No ? ?Anything we can do to help take better care of Patient?: No ? ?Engagement Notes ?Cassie Ross on 05/14/2021 08:30 AM ?HC Chart Review: 5 min 05/14/21 ?HC Assessment call time spent: 5 min 05/16/21 ?CP Review: 10 min 05/16/21 ? ?Charlann Lange, RMA ?Healthcare Concierge  ?[phone] ? ?Pharmacist Review ?Adherence gaps identified?: No ?Drug Therapy Problems identified?: No ?Assessment: Controlled ? ?Cassie Ross ?Clinical Pharmacist ?

## 2021-05-22 ENCOUNTER — Other Ambulatory Visit: Payer: Self-pay

## 2021-05-22 ENCOUNTER — Encounter: Payer: Self-pay | Admitting: Nurse Practitioner

## 2021-05-22 ENCOUNTER — Ambulatory Visit (INDEPENDENT_AMBULATORY_CARE_PROVIDER_SITE_OTHER): Payer: HMO | Admitting: Internal Medicine

## 2021-05-22 VITALS — BP 141/77 | HR 80 | Temp 98.4°F | Resp 16 | Ht 63.0 in | Wt 201.0 lb

## 2021-05-22 DIAGNOSIS — R2243 Localized swelling, mass and lump, lower limb, bilateral: Secondary | ICD-10-CM

## 2021-05-22 DIAGNOSIS — E1165 Type 2 diabetes mellitus with hyperglycemia: Secondary | ICD-10-CM | POA: Diagnosis not present

## 2021-05-22 DIAGNOSIS — J449 Chronic obstructive pulmonary disease, unspecified: Secondary | ICD-10-CM | POA: Diagnosis not present

## 2021-05-22 DIAGNOSIS — J4550 Severe persistent asthma, uncomplicated: Secondary | ICD-10-CM

## 2021-05-22 LAB — POCT GLYCOSYLATED HEMOGLOBIN (HGB A1C): Hemoglobin A1C: 6.5 % — AB (ref 4.0–5.6)

## 2021-05-22 MED ORDER — DAPAGLIFLOZIN PROPANEDIOL 10 MG PO TABS: 10.0000 mg | ORAL_TABLET | Freq: Every day | ORAL | 1 refills | Status: DC

## 2021-05-22 NOTE — Progress Notes (Signed)
[place] ?[address] ?[place], [place] [address] ? ?Internal MEDICINE  ?Office Visit Note ? ?Patient Name: Cassie Ross ? [id number]  ?[id number] ? ?Date of Service: 05/22/2021 ? ?Chief Complaint  ?Patient presents with  ? Follow-up  ?  Seeing derm for spots on legs   ? Diabetes  ? Gastroesophageal Reflux  ? Hypertension  ? Hyperlipidemia  ? ? ?HPI ?Cassie Ross is here for routine follow up  ?She is amazingly doing well on Breztri. She is off of steroids after a long period of time. She is walking better as well  ?Diabetic numbers  are improving. She is in good spirits  ?Will see cardiology as well  ? ?Has nodules on her legs, scheduled to see dermatology  ? ? ? ?Current Medication: ?Outpatient Encounter Medications as of 05/22/2021  ?Medication Sig  ? acetaminophen (TYLENOL) 500 MG tablet Take 1,000 mg by mouth every 8 (eight) hours as needed for moderate pain.   ? albuterol (PROVENTIL) (2.5 MG/3ML) 0.083% nebulizer solution Take 3 mLs (2.5 mg total) by nebulization every 6 (six) hours as needed for shortness of breath. USE 1 VIAL IN NEBULIZER EVERY 6 HOURS AS NEEDED  ? albuterol (VENTOLIN HFA) 108 (90 Base) MCG/ACT inhaler INHALE 2 PUFFS BY MOUTH EVERY 4 HOURS AS NEEDED FOR WHEEZING AND FOR SHORTNESS OF BREATH  ? ALPRAZolam (XANAX) 0.5 MG tablet Take 1 tablet by mouth twice daily as needed for anxiety  ? aspirin EC 81 MG tablet Take 81 mg by mouth daily.  ? azithromycin (ZITHROMAX) 250 MG tablet Take one tab a day for 10 days for uri  ? Blood Glucose Monitoring Suppl (ONETOUCH VERIO) w/Device KIT Use as directed diag  ? Budeson-Glycopyrrol-Formoterol (BREZTRI AEROSPHERE) 160-9-4.8 MCG/ACT AERO Inhale 2 puffs into the lungs 2 (two) times daily.  ? carboxymethylcellul-glycerin (LUBRICANT DROPS/DUAL-ACTION) 0.5-0.9 % ophthalmic solution Place 1-2 drops into both eyes 3 (three) times daily as needed for dry eyes.  ? dapagliflozin propanediol (FARXIGA) 10 MG TABS tablet Take 1 tablet (10 mg total) by mouth  daily before breakfast.  ? diltiazem (CARDIZEM CD) 180 MG 24 hr capsule Take 1 capsule by mouth twice daily  ? doxycycline (VIBRA-TABS) 100 MG tablet Take 1 tablet (100 mg total) by mouth 2 (two) times daily. With food  ? fluticasone (FLONASE) 50 MCG/ACT nasal spray Place 1 spray into both nostrils in the morning and at bedtime.  ? furosemide (LASIX) 40 MG tablet Take 1 tablet (40 mg total) by mouth 2 (two) times daily as needed (fluid retention.).  ? glucose blood (ONETOUCH VERIO) test strip Use 1 strip to check glucose 3 times a daily as directed  ? insulin aspart (NOVOLOG FLEXPEN) 100 UNIT/ML FlexPen Inject 2-10 Units into the skin as directed. Sliding scale - use as directed QAC per sliding scale instructions. Max daily dose is 25 units in 24 hours. E11.65  ? insulin glargine (LANTUS SOLOSTAR) 100 UNIT/ML Solostar Pen Inject 10 Units into the skin at bedtime.  ? ipratropium-albuterol (DUONEB) 0.5-2.5 (3) MG/3ML SOLN Inhale 3 mLs into the lungs every 4 (four) hours as needed (wheezing/shortness of breath). J44.0  ? LEADER UNIFINE PENTIPS 32G X 4 MM MISC USE AS DIRECTED WITH INSULIN  ? loratadine (CLARITIN) 10 MG tablet Take 10 mg by mouth daily.  ? montelukast (SINGULAIR) 10 MG tablet Take 1 tablet (10 mg total) by mouth at bedtime.  ? Multiple Vitamin (MULTIVITAMIN WITH MINERALS) TABS tablet Take 1 tablet by mouth daily.  ? pantoprazole (PROTONIX)  40 MG tablet Take 1 tablet (40 mg total) by mouth 2 (two) times daily.  ? potassium chloride (KLOR-CON) 10 MEQ tablet Take 1 tablet (10 mEq total) by mouth 2 (two) times daily.  ? predniSONE (DELTASONE) 20 MG tablet Take 1 tab po 3 x day for 3 days then take 1 tab po 2 x a day for 3 days and then take 1 tab po daily for 3 days. Kep extra on hand in case another taper is needed at a later date.  ? theophylline (THEODUR) 300 MG 12 hr tablet Take 1 tablet by mouth twice daily  ? ?No facility-administered encounter medications on file as of 05/22/2021.  ? ? ?Surgical  History: ?Past Surgical History:  ?Procedure Laterality Date  ? BLADDER SUSPENSION N/A 05/23/2020  ? Procedure: TRANSVAGINAL TAPE (TVT) PROCEDURE;  Surgeon: Gae Dry, MD;  Location: [facility] ORS;  Service: Gynecology;  Laterality: N/A;  ? cataract surgery    ? CHOLECYSTECTOMY    ? COLONOSCOPY WITH PROPOFOL N/A 01/06/2018  ? Procedure: COLONOSCOPY WITH PROPOFOL;  Surgeon: Lollie Sails, MD;  Location: [facility] ENDOSCOPY;  Service: Endoscopy;  Laterality: N/A;  ? CYSTOCELE REPAIR N/A 05/23/2020  ? Procedure: ANTERIOR REPAIR (CYSTOCELE);  Surgeon: Gae Dry, MD;  Location: [facility] ORS;  Service: Gynecology;  Laterality: N/A;  ? CYSTOSCOPY N/A 05/23/2020  ? Procedure: CYSTOSCOPY;  Surgeon: Gae Dry, MD;  Location: [facility] ORS;  Service: Gynecology;  Laterality: N/A;  ? ESOPHAGEAL DILATION    ? ESOPHAGOGASTRODUODENOSCOPY (EGD) WITH PROPOFOL N/A 01/06/2018  ? Procedure: ESOPHAGOGASTRODUODENOSCOPY (EGD) WITH PROPOFOL;  Surgeon: Lollie Sails, MD;  [facility] END[facility];  Service: Endoscopy;  Laterality: N/A;  ? EYE SURGERY Bilateral 2014  ? cataract   ? HERNIA REPAIR    ? hiatial hernia repair    ? NASAL SINUS SURGERY    ? PUBOVAGINAL SLING N/A 05/23/2020  ? Procedure: Gaynelle Arabian;  Surgeon: Gae Dry, MD;  Location: [facility] ORS;  Service: Gynecology;  Laterality: N/A;  ? RECTOCELE REPAIR  05/23/2020  ? Procedure: POSTERIOR REPAIR (RECTOCELE);  Surgeon: Gae Dry, MD;  Location: [facility] ORS;  Service: Gynecology;;  ? VAGINAL HYSTERECTOMY Bilateral 05/23/2020  ? Procedure: HYSTERECTOMY VAGINAL BILATERIAL SALPINGO OOPHORECTOMY;  Surgeon: Gae Dry, MD;  Location: [facility] ORS;  Service: Gynecology;  Laterality: Bilateral;  LEFT fallopian tube, BILAT ovaries  ? ? ?Medical History: ?Past Medical History:  ?Diagnosis Date  ? Anemia   ? Asthma   ? CHF (congestive heart failure) ([place])   ? 1991- unknown after asthma attack  ? Chicken pox   ? Complication of anesthesia 1998  ? woke up during  procedure   ? COPD (chronic obstructive pulmonary disease) ([place])   ? Diabetes ([place])   ? Type II   ? Difficult intubation   ? 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 DVT (deep venous thrombosis) ([place])   ? GERD (gastroesophageal reflux disease)   ? Hernia, hiatal 2007  ? HLD (hyperlipidemia)   ? Hypertension   ? Hypertension   ? Pneumonia   ? Seasonal allergies   ? ? ?Family History: ?Family History  ?Problem Relation Age of Onset  ? Hypertension Mother   ? Lung cancer Mother   ? Colon cancer Father   ? Colon polyps Sister   ? Diabetes Sister   ? ? ?Social History  ? ?Socioeconomic History  ? Marital status: Married  ?  Spouse name: Not on file  ? Number of  children: Not on file  ? Years of education: Not on file  ? Highest education level: Not on file  ?Occupational History  ? Not on file  ?Tobacco Use  ? Smoking status: Never  ? Smokeless tobacco: Never  ?Vaping Use  ? Vaping Use: Never used  ?Substance and Sexual Activity  ? Alcohol use: Yes  ?  Comment: glass of wine rarely  ? Drug use: No  ? Sexual activity: Not on file  ?Other Topics Concern  ? Not on file  ?Social History Narrative  ? Lives at home with family. Independent  ? ?Social Determinants of Health  ? ?Financial Resource Strain: Not on file  ?Food Insecurity: Not on file  ?Transportation Needs: Not on file  ?Physical Activity: Not on file  ?Stress: Not on file  ?Social Connections: Not on file  ?Intimate Partner Violence: Not on file  ? ? ? ? ?Review of Systems  ?Constitutional:  Negative for fatigue and fever.  ?HENT:  Negative for congestion, mouth sores and postnasal drip.   ?Respiratory:  Negative for cough.   ?Cardiovascular:  Negative for chest pain.  ?Genitourinary:  Negative for flank pain.  ?Psychiatric/Behavioral: Negative.    ? ?Vital Signs: ?BP (!) 141/77   Pulse 80   Temp 98.4 ?F (36.9 ?C)   Resp 16   Ht _0  (1.6 m)   Wt 201 lb (91.2 kg)   SpO2 97%   BMI 35.61 kg/m?  ? ? ?Physical Exam ?Constitutional:   ?   Appearance: Normal  appearance.  ?HENT:  ?   Head: Normocephalic and atraumatic.  ?   Nose: Nose normal.  ?   Mouth/Throat:  ?   Mouth: Mucous membranes are moist.  ?   Pharynx: No posterior oropharyngeal erythema.  ?Eyes:  ?   Extraocular Movemen

## 2021-06-04 ENCOUNTER — Other Ambulatory Visit: Payer: Self-pay | Admitting: Nurse Practitioner

## 2021-06-04 DIAGNOSIS — F411 Generalized anxiety disorder: Secondary | ICD-10-CM

## 2021-06-04 NOTE — Telephone Encounter (Signed)
Last 05/22/21 and next 6/23 ?

## 2021-06-08 DIAGNOSIS — L921 Necrobiosis lipoidica, not elsewhere classified: Secondary | ICD-10-CM | POA: Diagnosis not present

## 2021-06-08 DIAGNOSIS — L309 Dermatitis, unspecified: Secondary | ICD-10-CM | POA: Diagnosis not present

## 2021-06-22 DIAGNOSIS — J449 Chronic obstructive pulmonary disease, unspecified: Secondary | ICD-10-CM | POA: Diagnosis not present

## 2021-07-02 ENCOUNTER — Other Ambulatory Visit: Payer: Self-pay | Admitting: Nurse Practitioner

## 2021-07-02 DIAGNOSIS — K219 Gastro-esophageal reflux disease without esophagitis: Secondary | ICD-10-CM

## 2021-07-04 ENCOUNTER — Other Ambulatory Visit: Payer: Self-pay

## 2021-07-04 MED ORDER — ONETOUCH VERIO VI STRP: ORAL_STRIP | 12 refills | Status: DC

## 2021-07-08 ENCOUNTER — Other Ambulatory Visit: Payer: Self-pay | Admitting: Nurse Practitioner

## 2021-07-08 DIAGNOSIS — I10 Essential (primary) hypertension: Secondary | ICD-10-CM

## 2021-07-09 ENCOUNTER — Other Ambulatory Visit: Payer: Self-pay | Admitting: Nurse Practitioner

## 2021-07-09 DIAGNOSIS — I10 Essential (primary) hypertension: Secondary | ICD-10-CM

## 2021-07-22 DIAGNOSIS — J449 Chronic obstructive pulmonary disease, unspecified: Secondary | ICD-10-CM | POA: Diagnosis not present

## 2021-07-25 ENCOUNTER — Other Ambulatory Visit: Payer: Self-pay | Admitting: Nurse Practitioner

## 2021-07-25 DIAGNOSIS — F411 Generalized anxiety disorder: Secondary | ICD-10-CM

## 2021-07-25 NOTE — Telephone Encounter (Signed)
Last appt 3/233 and next 6/23 ?

## 2021-07-27 NOTE — Telephone Encounter (Signed)
Med sent to pharmacy.

## 2021-07-30 ENCOUNTER — Telehealth: Payer: Self-pay

## 2021-07-30 DIAGNOSIS — J4550 Severe persistent asthma, uncomplicated: Secondary | ICD-10-CM

## 2021-07-31 MED ORDER — BREZTRI AEROSPHERE 160-9-4.8 MCG/ACT IN AERO: 2.0000 | INHALATION_SPRAY | Freq: Two times a day (BID) | RESPIRATORY_TRACT | 1 refills | Status: DC

## 2021-07-31 MED ORDER — THEOPHYLLINE ER 600 MG PO TB24: 600.0000 mg | ORAL_TABLET | Freq: Every day | ORAL | 1 refills | Status: DC

## 2021-07-31 NOTE — Telephone Encounter (Signed)
error 

## 2021-08-01 ENCOUNTER — Other Ambulatory Visit: Payer: Self-pay | Admitting: Nurse Practitioner

## 2021-08-03 ENCOUNTER — Telehealth: Payer: Self-pay

## 2021-08-03 NOTE — Telephone Encounter (Signed)
Spoke to pt on her Cassie Ross and she advised that her insurance did cover the [facility] at [employer].  Advised pt that if any future problems to let us know.  Pr also advised that the pharmacies are on backorder for the THEOPHYLLINE 600 ER and that pt called in the [facility] to [facility] for 300 mg and paid out of pocket thru [place] for it

## 2021-08-15 ENCOUNTER — Telehealth: Payer: Self-pay

## 2021-08-22 ENCOUNTER — Ambulatory Visit: Payer: HMO | Admitting: Nurse Practitioner

## 2021-08-22 DIAGNOSIS — J449 Chronic obstructive pulmonary disease, unspecified: Secondary | ICD-10-CM | POA: Diagnosis not present

## 2021-08-27 ENCOUNTER — Telehealth: Payer: Self-pay

## 2021-08-27 NOTE — Telephone Encounter (Signed)
Left voicemail for patient asking is she would be interested in getting a diabetic eye exam on 08/28/21.

## 2021-09-03 ENCOUNTER — Ambulatory Visit (INDEPENDENT_AMBULATORY_CARE_PROVIDER_SITE_OTHER): Payer: HMO | Admitting: Nurse Practitioner

## 2021-09-03 ENCOUNTER — Encounter: Payer: Self-pay | Admitting: Nurse Practitioner

## 2021-09-03 VITALS — BP 138/82 | HR 68 | Temp 98.0°F | Resp 16 | Ht 63.0 in | Wt 196.6 lb

## 2021-09-03 DIAGNOSIS — J0110 Acute frontal sinusitis, unspecified: Secondary | ICD-10-CM | POA: Diagnosis not present

## 2021-09-03 DIAGNOSIS — I10 Essential (primary) hypertension: Secondary | ICD-10-CM | POA: Diagnosis not present

## 2021-09-03 DIAGNOSIS — F411 Generalized anxiety disorder: Secondary | ICD-10-CM

## 2021-09-03 DIAGNOSIS — E1165 Type 2 diabetes mellitus with hyperglycemia: Secondary | ICD-10-CM

## 2021-09-03 DIAGNOSIS — J4550 Severe persistent asthma, uncomplicated: Secondary | ICD-10-CM

## 2021-09-03 LAB — POCT GLYCOSYLATED HEMOGLOBIN (HGB A1C): Hemoglobin A1C: 6.4 % — AB (ref 4.0–5.6)

## 2021-09-03 MED ORDER — ALPRAZOLAM 0.5 MG PO TABS: 0.5000 mg | ORAL_TABLET | Freq: Two times a day (BID) | ORAL | 0 refills | Status: DC | PRN

## 2021-09-03 MED ORDER — AZITHROMYCIN 250 MG PO TABS: ORAL_TABLET | ORAL | 0 refills | Status: AC

## 2021-09-03 NOTE — Progress Notes (Signed)
[facility] [address] [place], [place] [address]  Internal MEDICINE  Office Visit Note  Patient Name: Cassie Ross  [id number]  [id number]  Date of Service: 09/03/2021  Chief Complaint  Patient presents with   Follow-up   Diabetes   Gastroesophageal Reflux   Hypertension   Hyperlipidemia    HPI Yusra presents for follow-up visit for diabetes, hypertension, asthma and medication review.  Her A1c today continues to improve at 6.4, her previous A1c was 6.5 earlier this year.  Her breathing continues to remain stable she does report increased allergy symptoms and a possible sinus infection starting.  She is using her over-the-counter antihistamine and her Flonase nasal spray but is still having some difficulty controlling symptoms She is also in the donut hole and her co-pay for Farxiga right now is $400-500.     Current Medication: Outpatient Encounter Medications as of 09/03/2021  Medication Sig   acetaminophen (TYLENOL) 500 MG tablet Take 1,000 mg by mouth every 8 (eight) hours as needed for moderate pain.    albuterol (PROVENTIL) (2.5 MG/3ML) 0.083% nebulizer solution Take 3 mLs (2.5 mg total) by nebulization every 6 (six) hours as needed for shortness of breath. USE 1 VIAL IN NEBULIZER EVERY 6 HOURS AS NEEDED   albuterol (VENTOLIN HFA) 108 (90 Base) MCG/ACT inhaler INHALE 2 PUFFS BY MOUTH EVERY 4 HOURS AS NEEDED FOR WHEEZING AND FOR SHORTNESS OF BREATH   aspirin EC 81 MG tablet Take 81 mg by mouth daily.   azithromycin (ZITHROMAX) 250 MG tablet Take 2 tablets on day 1, then 1 tablet daily on days 2 through 5   Blood Glucose Monitoring Suppl (ONETOUCH VERIO) w/Device KIT Use as directed diag   Budeson-Glycopyrrol-Formoterol (BREZTRI AEROSPHERE) 160-9-4.8 MCG/ACT AERO Inhale 2 puffs into the lungs 2 (two) times daily.   carboxymethylcellul-glycerin (LUBRICANT DROPS/DUAL-ACTION) 0.5-0.9 % ophthalmic solution Place 1-2 drops into both eyes 3 (three) times daily as  needed for dry eyes.   Clobetasol Prop Emollient Base (CLOBETASOL PROPIONATE E) 0.05 % emollient cream Apply topically 2 (two) times daily.   dapagliflozin propanediol (FARXIGA) 10 MG TABS tablet Take 1 tablet (10 mg total) by mouth daily before breakfast.   diltiazem (CARDIZEM CD) 180 MG 24 hr capsule Take 1 capsule by mouth twice daily   doxycycline (VIBRA-TABS) 100 MG tablet Take 1 tablet (100 mg total) by mouth 2 (two) times daily. With food   fluticasone (FLONASE) 50 MCG/ACT nasal spray Place 1 spray into both nostrils in the morning and at bedtime.   furosemide (LASIX) 40 MG tablet Take 1 tablet (40 mg total) by mouth 2 (two) times daily as needed (fluid retention.).   glucose blood (ONETOUCH VERIO) test strip Use 1 strip to check glucose 4 times a daily as directed   insulin aspart (NOVOLOG FLEXPEN) 100 UNIT/ML FlexPen Inject 2-10 Units into the skin as directed. Sliding scale - use as directed QAC per sliding scale instructions. Max daily dose is 25 units in 24 hours. E11.65   insulin glargine (LANTUS SOLOSTAR) 100 UNIT/ML Solostar Pen Inject 10 Units into the skin at bedtime.   ipratropium-albuterol (DUONEB) 0.5-2.5 (3) MG/3ML SOLN Inhale 3 mLs into the lungs every 4 (four) hours as needed (wheezing/shortness of breath). 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   montelukast (SINGULAIR) 10 MG tablet Take 1 tablet (10 mg total) by mouth at  bedtime.   Multiple Vitamin (MULTIVITAMIN WITH MINERALS) TABS tablet Take 1 tablet by mouth daily.   pantoprazole (PROTONIX) 40 MG tablet Take 1 tablet by mouth twice daily   potassium chloride (KLOR-CON) 10 MEQ tablet Take 1 tablet (10 mEq total) by mouth 2 (two) times daily.   theophylline (UNIPHYL) 600 MG 24 hr tablet Take 1 tablet (600 mg total) by mouth daily.   [DISCONTINUED] ALPRAZolam (XANAX) 0.5 MG tablet Take 1 tablet by mouth twice daily as needed  for anxiety   [DISCONTINUED] azithromycin (ZITHROMAX) 250 MG tablet Take one tab a day for 10 days for uri   [DISCONTINUED] predniSONE (DELTASONE) 20 MG tablet Take 1 tab po 3 x day for 3 days then take 1 tab po 2 x a day for 3 days and then take 1 tab po daily for 3 days. Kep extra on hand in case another taper is needed at a later date.   ALPRAZolam (XANAX) 0.5 MG tablet Take 1 tablet (0.5 mg total) by mouth 2 (two) times daily as needed. for anxiety   No facility-administered encounter medications on file as of 09/03/2021.    Surgical History: Past Surgical History:  Procedure Laterality Date   BLADDER SUSPENSION N/A 05/23/2020   Procedure: TRANSVAGINAL TAPE (TVT) PROCEDURE;  Surgeon: Nadara Mustard, MD;  Location: [facility] ORS;  Service: Gynecology;  Laterality: N/A;   cataract surgery     CHOLECYSTECTOMY     COLONOSCOPY WITH PROPOFOL N/A 01/06/2018   Procedure: COLONOSCOPY WITH PROPOFOL;  Surgeon: Christena Deem, MD;  [facility] ENDOSCOPY;  Service: Endoscopy;  Laterality: N/A;   CYSTOCELE REPAIR N/A 05/23/2020   Procedure: ANTERIOR REPAIR (CYSTOCELE);  Surgeon: Nadara Mustard, MD;  Location: [facility] ORS;  Service: Gynecology;  Laterality: N/A;   CYSTOSCOPY N/A 05/23/2020   Procedure: CYSTOSCOPY;  Surgeon: Nadara Mustard, MD;  Location: [facility] ORS;  Service: Gynecology;  Laterality: N/A;   ESOPHAGEAL DILATION     ESOPHAGOGASTRODUODENOSCOPY (EGD) WITH PROPOFOL N/A 01/06/2018   Procedure: ESOPHAGOGASTRODUODENOSCOPY (EGD) WITH PROPOFOL;  Surgeon: Christena Deem, MD;  Location: [facility] ENDOSCOPY;  Service: Endoscopy;  Laterality: N/A;   EYE SURGERY Bilateral 2014   cataract    HERNIA REPAIR     hiatial hernia repair     NASAL SINUS SURGERY     PUBOVAGINAL SLING N/A 05/23/2020   Procedure: Leonides Grills;  Surgeon: Nadara Mustard, MD;  Location: [facility] ORS;  Service: Gynecology;  Laterality: N/A;   RECTOCELE REPAIR  05/23/2020   Procedure: POSTERIOR REPAIR (RECTOCELE);  Surgeon:  Nadara Mustard, MD;  Location: [facility] ORS;  Service: Gynecology;;   VAGINAL HYSTERECTOMY Bilateral 05/23/2020   Procedure: HYSTERECTOMY VAGINAL BILATERIAL SALPINGO OOPHORECTOMY;  Surgeon: Nadara Mustard, MD;  Location: [facility] ORS;  Service: Gynecology;  Laterality: Bilateral;  LEFT fallopian tube, BILAT ovaries    Medical History: 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    DVT (deep venous thrombosis) ([facility])    GERD (gastroesophageal reflux disease)    Hernia, hiatal 2007   HLD (hyperlipidemia)    Hypertension  Hypertension    Pneumonia    Seasonal allergies     Family History: Family History  Problem Relation Age of Onset   Hypertension Mother    Lung cancer Mother    Colon cancer Father    Colon polyps Sister    Diabetes Sister     Social History   Socioeconomic History   Marital status: Married    Spouse name: Not on file   Number of children: Not on file   Years of education: Not on file   Highest education level: Not on file  Occupational History   Not on file  Tobacco Use   Smoking status: Never   Smokeless tobacco: Never  Vaping Use   Vaping Use: Never used  Substance and Sexual Activity   Alcohol use: Yes    Comment: glass of wine rarely   Drug use: No   Sexual activity: Not on file  Other Topics Concern   Not on file  Social History Narrative   Lives at home with family. Independent   Social Determinants of Health   Financial Resource Strain: Not on file  Food Insecurity: Not on file  Transportation Needs: Not on file  Physical Activity: Not on file  Stress: Not on file  Social Connections: Not on file  Intimate Partner Violence:  Not on file      Review of Systems  Constitutional:  Negative for chills, fatigue and unexpected weight change.  HENT:  Positive for congestion, ear pain, postnasal drip, rhinorrhea, sinus pressure and sinus pain. Negative for sneezing, sore throat and trouble swallowing.   Eyes:  Negative for redness.  Respiratory:  Positive for cough. Negative for chest tightness, shortness of breath and wheezing.   Cardiovascular: Negative.  Negative for chest pain and palpitations.  Gastrointestinal: Negative.  Negative for abdominal pain, constipation, diarrhea, nausea and vomiting.  Genitourinary:  Negative for dysuria and frequency.  Musculoskeletal:  Negative for arthralgias, back pain, joint swelling and neck pain.  Skin:  Negative for rash.  Neurological: Negative.  Negative for tremors and numbness.  Hematological:  Negative for adenopathy. Does not bruise/bleed easily.  Psychiatric/Behavioral:  Negative for behavioral problems (Depression), sleep disturbance and suicidal ideas. The patient is not nervous/anxious.     Vital Signs: BP 138/82   Pulse 68   Temp 98 F (36.7 C)   Resp 16   Ht 5\' 3"  (1.6 m)   Wt 196 lb 9.6 oz (89.2 kg)   SpO2 97%   BMI 34.83 kg/m    Physical Exam Vitals reviewed.  Constitutional:      General: She is not in acute distress.    Appearance: Normal appearance. She is obese. She is not ill-appearing.  HENT:     Head: Normocephalic and atraumatic.     Right Ear: Tympanic membrane, ear canal and external ear normal.     Left Ear: Tympanic membrane, ear canal and external ear normal.     Nose: Mucosal edema, congestion and rhinorrhea present.     Right Turbinates: Swollen and pale.     Left Turbinates: Swollen and pale.     Right Sinus: No maxillary sinus tenderness or frontal sinus tenderness.     Left Sinus: No maxillary sinus tenderness or frontal sinus tenderness.     Mouth/Throat:     Mouth: Mucous membranes are moist.     Pharynx: Uvula midline.  Posterior oropharyngeal erythema present.     Tonsils: 0 on the right. 0 on the left.  Eyes:     Pupils: Pupils are equal, round, and reactive to light.  Cardiovascular:     Heart sounds: Normal heart sounds. No murmur heard. Pulmonary:     Effort: Pulmonary effort is normal. No respiratory distress.     Breath sounds: Normal breath sounds. No wheezing.  Neurological:     Mental Status: She is alert and oriented to person, place, and time.  Psychiatric:        Mood and Affect: Mood normal.        Behavior: Behavior normal.        Assessment/Plan: 1. Uncontrolled type 2 diabetes mellitus with hyperglycemia (HCC) A1c continues to improve, if unable to afford copay for farxiga while in "donut hole" may stop medication if she runs out and take lantus  insulin as discussed until copay for farxiga drops back down.  - POCT HgB A1C  2. Acute non-recurrent frontal sinusitis Empiric antibiotic treatment prescribed. May continue to use OTC coricidin for symptom management if desired.  - azithromycin (ZITHROMAX) 250 MG tablet; Take 2 tablets on day 1, then 1 tablet daily on days 2 through 5  Dispense: 6 tablet; Refill: 0  3. Severe persistent asthma without complication Stable, no change in respiratory status  4. Essential hypertension Stable and well controlled with current medication  5. Generalized anxiety disorder Stable, refills ordered - ALPRAZolam (XANAX) 0.5 MG tablet; Take 1 tablet (0.5 mg total) by mouth 2 (two) times daily as needed. for anxiety  Dispense: 90 tablet; Refill: 0   General Counseling: Nesreen verbalizes understanding of the findings of todays visit and agrees with plan of treatment. I have discussed any further diagnostic evaluation that may be needed or ordered today. We also reviewed her medications today. she has been encouraged to call the office with any questions or concerns that should arise related to todays visit.    Orders Placed This Encounter   Procedures   POCT HgB A1C    Meds ordered this encounter  Medications   azithromycin (ZITHROMAX) 250 MG tablet    Sig: Take 2 tablets on day 1, then 1 tablet daily on days 2 through 5    Dispense:  6 tablet    Refill:  0   ALPRAZolam (XANAX) 0.5 MG tablet    Sig: Take 1 tablet (0.5 mg total) by mouth 2 (two) times daily as needed. for anxiety    Dispense:  90 tablet    Refill:  0    Return in about 3 months (around 12/04/2021) for F/U, Recheck A1C, Tarrie Mcmichen PCP.   Total time spent:30 Minutes Time spent includes review of chart, medications, test results, and follow up plan with the patient.   [place] Controlled Substance Database was reviewed by me.  This patient was seen by Sallyanne Kuster, FNP-C in collaboration with Dr. Beverely Risen as a part of collaborative care agreement.   Vilma Will R. Tedd Sias, MSN, FNP-C Internal medicine

## 2021-09-04 ENCOUNTER — Other Ambulatory Visit: Payer: Self-pay | Admitting: Nurse Practitioner

## 2021-09-04 DIAGNOSIS — J449 Chronic obstructive pulmonary disease, unspecified: Secondary | ICD-10-CM

## 2021-09-04 DIAGNOSIS — J4550 Severe persistent asthma, uncomplicated: Secondary | ICD-10-CM

## 2021-09-14 ENCOUNTER — Telehealth: Payer: Self-pay

## 2021-09-14 DIAGNOSIS — N362 Urethral caruncle: Secondary | ICD-10-CM

## 2021-09-14 NOTE — Telephone Encounter (Signed)
Patient reached out to office requesting referral for urology. I reviewed back in patients chart and saw that she saw Dr. Kenton Kingfisher on 03/20/21 for urethral polyp ,under assessment and plan Dr. Kenton Kingfisher mentioned "Monitor for bleeding or interruption in urine stream Urology referral would be next option to see about removing polyp." Okay to place order for urology for further evaluation? KW

## 2021-09-27 ENCOUNTER — Other Ambulatory Visit: Payer: Self-pay | Admitting: Internal Medicine

## 2021-09-27 DIAGNOSIS — I10 Essential (primary) hypertension: Secondary | ICD-10-CM

## 2021-10-04 ENCOUNTER — Encounter: Payer: Self-pay | Admitting: Urology

## 2021-10-04 ENCOUNTER — Ambulatory Visit (INDEPENDENT_AMBULATORY_CARE_PROVIDER_SITE_OTHER): Payer: HMO | Admitting: Urology

## 2021-10-04 VITALS — BP 137/84 | HR 101 | Ht 63.0 in | Wt 193.0 lb

## 2021-10-04 DIAGNOSIS — N362 Urethral caruncle: Secondary | ICD-10-CM

## 2021-10-04 DIAGNOSIS — N200 Calculus of kidney: Secondary | ICD-10-CM

## 2021-10-05 LAB — MICROSCOPIC EXAMINATION: RBC, Urine: NONE SEEN /hpf (ref 0–2)

## 2021-10-05 LAB — URINALYSIS, COMPLETE
Bilirubin, UA: NEGATIVE
Glucose, UA: NEGATIVE
Ketones, UA: NEGATIVE
Nitrite, UA: NEGATIVE
Protein,UA: NEGATIVE
RBC, UA: NEGATIVE
Specific Gravity, UA: 1.01 (ref 1.005–1.030)
Urobilinogen, Ur: 0.2 mg/dL (ref 0.2–1.0)
pH, UA: 5 (ref 5.0–7.5)

## 2021-10-06 ENCOUNTER — Encounter: Payer: Self-pay | Admitting: Urology

## 2021-10-06 MED ORDER — ESTRADIOL 0.1 MG/GM VA CREA: TOPICAL_CREAM | VAGINAL | 0 refills | Status: DC

## 2021-10-06 NOTE — Progress Notes (Signed)
 10/04/2021 3:54 PM   Cassie Ross 01/21/1961 [id number]  Referring provider: Gledhill, Jane, Ross [address] [place],  [place] [address]  Chief Complaint  Patient presents with   Other    HPI: Cassie Ross is a 61 y.o. female furred for evaluation of a urethral polyp.  Saw Dr. Harris January 2023 with complaints of intermittent spotting when wiping after voiding Diagnosed with a urethral polyp on exam and urology evaluation recommended if she had voiding problems or persistent symptoms Notes intermittent spotting the last couple of months No gross hematuria Denies dysuria or bothersome lower urinary tract symptoms.   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    DVT (deep venous thrombosis) (HCC)    GERD (gastroesophageal reflux disease)    Hernia, hiatal 2007   HLD (hyperlipidemia)    Hypertension    Hypertension    Pneumonia    Seasonal allergies     Surgical History: Past Surgical History:  Procedure Laterality Date   BLADDER SUSPENSION N/A 05/23/2020   Procedure: TRANSVAGINAL TAPE (TVT) PROCEDURE;  Surgeon: Cassie Ross;  Location: [facility];  Service: Gynecology;  Laterality: N/A;   cataract surgery     CHOLECYSTECTOMY     COLONOSCOPY WITH PROPOFOL N/A 01/06/2018   Procedure: COLONOSCOPY WITH PROPOFOL;  Surgeon: Cassie Ross;  Location: [facility];  Service: Endoscopy;  Laterality: N/A;   CYSTOCELE REPAIR N/A 05/23/2020   Procedure: ANTERIOR REPAIR (CYSTOCELE);  Surgeon: Cassie Ross;  Location: [facility];  Service: Gynecology;  Laterality: N/A;   CYSTOSCOPY N/A 05/23/2020   Procedure: CYSTOSCOPY;   Surgeon: Cassie Ross;  Location: [facility];  Service: Gynecology;  Laterality: N/A;   ESOPHAGEAL DILATION     ESOPHAGOGASTRODUODENOSCOPY (EGD) WITH PROPOFOL N/A 01/06/2018   Procedure: ESOPHAGOGASTRODUODENOSCOPY (EGD) WITH PROPOFOL;  Surgeon: Cassie Ross;  Location: [facility];  Service: Endoscopy;  Laterality: N/A;   EYE SURGERY Bilateral 2014   cataract    HERNIA REPAIR     hiatial hernia repair     NASAL SINUS SURGERY     PUBOVAGINAL SLING N/A 05/23/2020   Procedure: PUBO-VAGINAL SLING;  Surgeon: Cassie Ross;  Location: [facility];  Service: Gynecology;  Laterality: N/A;   RECTOCELE REPAIR  05/23/2020   Procedure: POSTERIOR REPAIR (RECTOCELE);  Surgeon: Cassie Ross;  Location: [facility];  Service: Gynecology;;   VAGINAL HYSTERECTOMY Bilateral 05/23/2020   Procedure: HYSTERECTOMY VAGINAL BILATERIAL SALPINGO OOPHORECTOMY;  Surgeon: Cassie Ross;  Location: [facility];  Service: Gynecology;  Laterality: Bilateral;  LEFT fallopian tube, BILAT ovaries    Home Medications:  Allergies as of 10/04/2021       Reactions   Shellfish Allergy Swelling   Sunflower Oil Swelling   seeds   Levaquin [levofloxacin In D5w] Rash   Pill form   Levaquin [levofloxacin] Rash   Allergy to pill form only. Patient has had IV with no problem.   Penicillins Rash   Has patient had a PCN reaction causing immediate rash, facial/tongue/throat swelling, SOB or   lightheadedness with hypotension: no Has patient had a PCN reaction causing severe rash involving mucus membranes or skin necrosis: no Has patient had a PCN reaction that required hospitalization no Has patient had a PCN reaction occurring within the last 10 years: no If all of the above answers are "NO", then may proceed with Cephalosporin use.        Medication List        Accurate as of October 04, 2021 11:59 PM. If you have any questions, ask your nurse or doctor.          STOP taking these medications     dapagliflozin propanediol 10 MG Tabs tablet Commonly known as: [place] Stopped by: Abbie Sons, Ross   doxycycline 100 MG tablet Commonly known as: VIBRA-TABS Stopped by: Abbie Sons, Ross       TAKE these medications    acetaminophen 500 MG tablet Commonly known as: TYLENOL Take 1,000 mg by mouth every 8 (eight) hours as needed for moderate pain.   albuterol (2.5 MG/3ML) 0.083% nebulizer solution Commonly known as: PROVENTIL Take 3 mLs (2.5 mg total) by nebulization every 6 (six) hours as needed for shortness of breath. USE 1 VIAL IN NEBULIZER EVERY 6 HOURS AS NEEDED   albuterol 108 (90 Base) MCG/ACT inhaler Commonly known as: VENTOLIN HFA INHALE 2 PUFFS BY MOUTH EVERY 4 HOURS AS NEEDED FOR WHEEZING AND FOR SHORTNESS OF BREATH   ALPRAZolam 0.5 MG tablet Commonly known as: XANAX Take 1 tablet (0.5 mg total) by mouth 2 (two) times daily as needed. for anxiety   aspirin EC 81 MG tablet Take 81 mg by mouth daily.   Breztri Aerosphere 160-9-4.8 MCG/ACT Aero Generic drug: Budeson-Glycopyrrol-Formoterol Inhale 2 puffs into the lungs 2 (two) times daily.   Clobetasol Propionate E 0.05 % emollient cream Generic drug: Clobetasol Prop Emollient Base Apply topically 2 (two) times daily.   diltiazem 180 MG 24 hr capsule Commonly known as: CARDIZEM CD Take 1 capsule by mouth twice daily   fluticasone 50 MCG/ACT nasal spray Commonly known as: FLONASE Place 1 spray into both nostrils in the morning and at bedtime.   furosemide 40 MG tablet Commonly known as: LASIX Take 1 tablet (40 mg total) by mouth 2 (two) times daily as needed (fluid retention.).   ipratropium-albuterol 0.5-2.5 (3) MG/3ML Soln Commonly known as: DUONEB Inhale 3 mLs into the lungs every 4 (four) hours as needed (wheezing/shortness of breath). J44.0   Lantus SoloStar 100 UNIT/ML Solostar Pen Generic drug: insulin glargine Inject 10 Units into the skin at bedtime.   Leader Unifine Pentips 32G X 4  MM Misc Generic drug: Insulin Pen Needle USE AS DIRECTED WITH INSULIN   loratadine 10 MG tablet Commonly known as: CLARITIN Take 10 mg by mouth daily.   Lubricant Drops/Dual-Action 0.5-0.9 % ophthalmic solution Generic drug: carboxymethylcellul-glycerin Place 1-2 drops into both eyes 3 (three) times daily as needed for dry eyes.   montelukast 10 MG tablet Commonly known as: SINGULAIR TAKE 1 TABLET BY MOUTH AT BEDTIME   multivitamin with minerals Tabs tablet Take 1 tablet by mouth daily.   NovoLOG FlexPen 100 UNIT/ML FlexPen Generic drug: insulin aspart Inject 2-10 Units into the skin as directed. Sliding scale - use as directed QAC per sliding scale instructions. Max daily dose is 25 units in 24 hours. E11.65   OneTouch Verio test strip Generic drug: glucose blood Use 1 strip to check glucose 4 times a daily as directed   OneTouch Verio w/Device  Kit Use as directed diag   pantoprazole 40 MG tablet Commonly known as: PROTONIX Take 1 tablet by mouth twice daily   potassium chloride 10 MEQ tablet Commonly known as: KLOR-CON M Take 1 tablet (10 mEq total) by mouth 2 (two) times daily.   theophylline 600 MG 24 hr tablet Commonly known as: UNIPHYL Take 1 tablet (600 mg total) by mouth daily.        Allergies:  Allergies  Allergen Reactions   Shellfish Allergy Swelling   Sunflower Oil Swelling    seeds   Levaquin [Levofloxacin In D5w] Rash    Pill form   Levaquin [Levofloxacin] Rash    Allergy to pill form only. Patient has had IV with no problem.   Penicillins Rash    Has patient had a PCN reaction causing immediate rash, facial/tongue/throat swelling, SOB or lightheadedness with hypotension: no Has patient had a PCN reaction causing severe rash involving mucus membranes or skin necrosis: no Has patient had a PCN reaction that required hospitalization no Has patient had a PCN reaction occurring within the last 10 years: no If all of the above answers are "NO",  then may proceed with Cephalosporin use.     Family History: Family History  Problem Relation Age of Onset   Hypertension Mother    Lung cancer Mother    Colon cancer Father    Colon polyps Sister    Diabetes Sister     Social History:  reports that she has never smoked. She has never used smokeless tobacco. She reports current alcohol use. She reports that she does not use drugs.   Physical Exam: BP 137/84   Pulse (!) 101   Ht 5' 3" (1.6 m)   Wt 193 lb (87.5 kg)   BMI 34.19 kg/m   Constitutional:  Alert and oriented, No acute distress. HEENT: [place] AT Respiratory: Normal respiratory effort, no increased work of breathing. GU: Atrophic vaginal mucosa.  Small hyperemic urethral caruncle Psychiatric: Normal mood and affect.  Laboratory Data:  Urinalysis Dipstick trace leukocytes Microscopy 11-30 WBC   Assessment & Plan:    1.  Urethral caruncle Intermittent spotting Estrace vaginal cream.  Apply a pea-sized amount to the meatal area twice weekly Follow-up exam 3 months    Abbie Sons, Ross  [place] [address], [place], [place] [address] [phone]

## 2021-10-10 ENCOUNTER — Ambulatory Visit: Payer: HMO | Admitting: Nurse Practitioner

## 2021-10-16 ENCOUNTER — Telehealth: Payer: Self-pay

## 2021-10-16 ENCOUNTER — Ambulatory Visit: Payer: HMO | Admitting: Nurse Practitioner

## 2021-10-16 NOTE — Telephone Encounter (Signed)
Left vm and sent mychart message to confirm 10/22/21 appointment-Toni

## 2021-10-22 ENCOUNTER — Ambulatory Visit: Payer: HMO | Admitting: Nurse Practitioner

## 2021-11-05 ENCOUNTER — Other Ambulatory Visit: Payer: Self-pay | Admitting: Nurse Practitioner

## 2021-11-05 DIAGNOSIS — F411 Generalized anxiety disorder: Secondary | ICD-10-CM

## 2021-11-14 ENCOUNTER — Other Ambulatory Visit: Payer: Self-pay | Admitting: Nurse Practitioner

## 2021-11-14 DIAGNOSIS — E876 Hypokalemia: Secondary | ICD-10-CM

## 2021-11-20 ENCOUNTER — Telehealth: Payer: Self-pay | Admitting: Nurse Practitioner

## 2021-11-20 NOTE — Telephone Encounter (Signed)
Left vm to confirm 11/27/21 appointment-Toni

## 2021-11-24 ENCOUNTER — Other Ambulatory Visit: Payer: Self-pay | Admitting: Nurse Practitioner

## 2021-11-24 DIAGNOSIS — J449 Chronic obstructive pulmonary disease, unspecified: Secondary | ICD-10-CM

## 2021-11-24 DIAGNOSIS — J4550 Severe persistent asthma, uncomplicated: Secondary | ICD-10-CM

## 2021-11-27 ENCOUNTER — Ambulatory Visit (INDEPENDENT_AMBULATORY_CARE_PROVIDER_SITE_OTHER): Payer: HMO | Admitting: Nurse Practitioner

## 2021-11-27 ENCOUNTER — Encounter: Payer: Self-pay | Admitting: Nurse Practitioner

## 2021-11-27 VITALS — BP 134/84 | HR 84 | Temp 97.8°F | Resp 16 | Ht 63.0 in | Wt 192.4 lb

## 2021-11-27 DIAGNOSIS — Z760 Encounter for issue of repeat prescription: Secondary | ICD-10-CM

## 2021-11-27 DIAGNOSIS — E1165 Type 2 diabetes mellitus with hyperglycemia: Secondary | ICD-10-CM | POA: Diagnosis not present

## 2021-11-27 DIAGNOSIS — Z0001 Encounter for general adult medical examination with abnormal findings: Secondary | ICD-10-CM | POA: Diagnosis not present

## 2021-11-27 DIAGNOSIS — E559 Vitamin D deficiency, unspecified: Secondary | ICD-10-CM | POA: Diagnosis not present

## 2021-11-27 DIAGNOSIS — E538 Deficiency of other specified B group vitamins: Secondary | ICD-10-CM

## 2021-11-27 DIAGNOSIS — Z1231 Encounter for screening mammogram for malignant neoplasm of breast: Secondary | ICD-10-CM

## 2021-11-27 DIAGNOSIS — J45909 Unspecified asthma, uncomplicated: Secondary | ICD-10-CM

## 2021-11-27 DIAGNOSIS — R300 Dysuria: Secondary | ICD-10-CM | POA: Diagnosis not present

## 2021-11-27 DIAGNOSIS — J4550 Severe persistent asthma, uncomplicated: Secondary | ICD-10-CM

## 2021-11-27 DIAGNOSIS — E782 Mixed hyperlipidemia: Secondary | ICD-10-CM

## 2021-11-27 DIAGNOSIS — K219 Gastro-esophageal reflux disease without esophagitis: Secondary | ICD-10-CM

## 2021-11-27 DIAGNOSIS — I10 Essential (primary) hypertension: Secondary | ICD-10-CM

## 2021-11-27 LAB — POCT GLYCOSYLATED HEMOGLOBIN (HGB A1C): Hemoglobin A1C: 6.1 % — AB (ref 4.0–5.6)

## 2021-11-27 MED ORDER — THEOPHYLLINE ER 600 MG PO TB24: 600.0000 mg | ORAL_TABLET | Freq: Every day | ORAL | 1 refills | Status: DC

## 2021-11-27 MED ORDER — PANTOPRAZOLE SODIUM 40 MG PO TBEC: 40.0000 mg | DELAYED_RELEASE_TABLET | Freq: Two times a day (BID) | ORAL | 1 refills | Status: DC

## 2021-11-27 MED ORDER — BREZTRI AEROSPHERE 160-9-4.8 MCG/ACT IN AERO: 2.0000 | INHALATION_SPRAY | Freq: Two times a day (BID) | RESPIRATORY_TRACT | 5 refills | Status: DC

## 2021-11-27 MED ORDER — ALBUTEROL SULFATE HFA 108 (90 BASE) MCG/ACT IN AERS: 2.0000 | INHALATION_SPRAY | RESPIRATORY_TRACT | 5 refills | Status: DC | PRN

## 2021-11-27 NOTE — Progress Notes (Signed)
[facility] [place], [place] [address]  Internal MEDICINE  Office Visit Note  Patient Name: Cassie Ross  [id number]  [id number]  Date of Service: 11/27/2021  Chief Complaint  Patient presents with   Quality Metric Gaps    Mammogram    Medicare Wellness   Diabetes   Hypertension    HPI [name] presents for an annual well visit and physical exam.  Well-appearing 61 year old female with hypertension, asthma, GERD, drug induced diabetes, osteoarthritis and GAD --blood pressure is controlled, other vital signs are normal --A1c is stable at 6.1 --due for colonoscopy --due for mammogram  --diabetic eye exam scheduled for November 1st.     Current Medication: Outpatient Encounter Medications as of 11/27/2021  Medication Sig   acetaminophen (TYLENOL) 500 MG tablet Take 1,000 mg by mouth every 8 (eight) hours as needed for moderate pain.    albuterol (PROVENTIL) (2.5 MG/3ML) 0.083% nebulizer solution Take 3 mLs (2.5 mg total) by nebulization every 6 (six) hours as needed for shortness of breath. USE 1 VIAL IN NEBULIZER EVERY 6 HOURS AS NEEDED   ALPRAZolam (XANAX) 0.5 MG tablet Take 1 tablet by mouth twice daily as needed for anxiety   aspirin EC 81 MG tablet Take 81 mg by mouth daily.   Blood Glucose Monitoring Suppl (ONETOUCH VERIO) w/Device KIT Use as directed diag   carboxymethylcellul-glycerin (LUBRICANT DROPS/DUAL-ACTION) 0.5-0.9 % ophthalmic solution Place 1-2 drops into both eyes 3 (three) times daily as needed for dry eyes.   Clobetasol Prop Emollient Base (CLOBETASOL PROPIONATE E) 0.05 % emollient cream Apply topically 2 (two) times daily.   diltiazem (CARDIZEM CD) 180 MG 24 hr capsule Take 1 capsule by mouth twice daily   estradiol (ESTRACE) 0.1 MG/GM vaginal cream Apply a pea-sized amount to fingertip and apply to the urethral meatus twice weekly   fluticasone (FLONASE) 50 MCG/ACT nasal spray Place 1 spray into both nostrils in the morning and  at bedtime.   furosemide (LASIX) 40 MG tablet Take 1 tablet (40 mg total) by mouth 2 (two) times daily as needed (fluid retention.).   glucose blood (ONETOUCH VERIO) test strip Use 1 strip to check glucose 4 times a daily as directed   insulin aspart (NOVOLOG FLEXPEN) 100 UNIT/ML FlexPen Inject 2-10 Units into the skin as directed. Sliding scale - use as directed QAC per sliding scale instructions. Max daily dose is 25 units in 24 hours. E11.65   insulin glargine (LANTUS SOLOSTAR) 100 UNIT/ML Solostar Pen Inject 10 Units into the skin at bedtime.   ipratropium-albuterol (DUONEB) 0.5-2.5 (3) MG/3ML SOLN Inhale 3 mLs into the lungs every 4 (four) hours as needed (wheezing/shortness of breath). 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   montelukast (SINGULAIR) 10 MG tablet TAKE 1 TABLET BY MOUTH AT BEDTIME   Multiple Vitamin (MULTIVITAMIN WITH MINERALS) TABS tablet Take 1 tablet by mouth daily.   potassium chloride (KLOR-CON M) 10 MEQ tablet Take 1 tablet by mouth twice daily   [DISCONTINUED] albuterol (VENTOLIN HFA) 108 (90 Base) MCG/ACT inhaler INHALE 2 PUFFS BY MOUTH EVERY 4 HOURS AS NEEDED FOR WHEEZING AND FOR SHORTNESS OF BREATH   [DISCONTINUED] Budeson-Glycopyrrol-Formoterol (BREZTRI AEROSPHERE) 160-9-4.8 MCG/ACT AERO Inhale 2 puffs into the lungs 2 (two) times daily.   [DISCONTINUED] pantoprazole (PROTONIX) 40 MG tablet Take 1 tablet by mouth twice daily   [DISCONTINUED]  theophylline (UNIPHYL) 600 MG 24 hr tablet Take 1 tablet (600 mg total) by mouth daily.   albuterol (VENTOLIN HFA) 108 (90 Base) MCG/ACT inhaler Inhale 2 puffs into the lungs every 4 (four) hours as needed for wheezing or shortness of breath.   Budeson-Glycopyrrol-Formoterol (BREZTRI AEROSPHERE) 160-9-4.8 MCG/ACT AERO Inhale 2 puffs into the lungs 2 (two) times daily.   pantoprazole (PROTONIX) 40 MG tablet Take 1 tablet (40 mg  total) by mouth 2 (two) times daily.   theophylline (UNIPHYL) 600 MG 24 hr tablet Take 1 tablet (600 mg total) by mouth daily.   No facility-administered encounter medications on file as of 11/27/2021.    Surgical History: Past Surgical History:  Procedure Laterality Date   BLADDER SUSPENSION N/A 05/23/2020   Procedure: TRANSVAGINAL TAPE (TVT) PROCEDURE;  Surgeon: Gae Dry, MD;  Location: [facility];  Service: Gynecology;  Laterality: N/A;   cataract surgery     CHOLECYSTECTOMY     COLONOSCOPY WITH PROPOFOL N/A 01/06/2018   Procedure: COLONOSCOPY WITH PROPOFOL;  Surgeon: Lollie Sails, MD;  Location: [facility] END[facility]COPY;  Service: Endoscopy;  Laterality: N/A;   CYSTOCELE REPAIR N/A 05/23/2020   Procedure: ANTERIOR REPAIR (CYSTOCELE);  Surgeon: Gae Dry, MD;  Location: [facility];  Service: Gynecology;  Laterality: N/A;   CYSTOSCOPY N/A 05/23/2020   Procedure: CYSTOSCOPY;  Surgeon: Gae Dry, MD;  Location: [facility];  Service: Gynecology;  Laterality: N/A;   ESOPHAGEAL DILATION     ESOPHAGOGASTRODUODENOSCOPY (EGD) WITH PROPOFOL N/A 01/06/2018   Procedure: ESOPHAGOGASTRODUODENOSCOPY (EGD) WITH PROPOFOL;  Surgeon: Lollie Sails, MD;  Location[facility] END[facility];  Service: Endoscopy;  Laterality: N/A;   EYE SURGERY Bilateral 2014   cataract    HERNIA REPAIR     hiatial hernia repair     NASAL SINUS SURGERY     PUBOVAGINAL SLING N/A 05/23/2020   Procedure: Gaynelle Arabian;  Surgeon: Gae Dry, MD;  Location: [facility];  Service: Gynecology;  Laterality: N/A;   RECTOCELE REPAIR  05/23/2020   Procedure: POSTERIOR REPAIR (RECTOCELE);  Surgeon: Gae Dry, MD;  Location: [facility];  Service: Gynecology;;   VAGINAL HYSTERECTOMY Bilateral 05/23/2020   Procedure: HYSTERECTOMY VAGINAL BILATERIAL SALPINGO OOPHORECTOMY;  Surgeon: Gae Dry, MD;  Location: [facility];  Service: Gynecology;  Laterality: Bilateral;  LEFT fallopian tube, BILAT ovaries    Medical  History: Past Medical History:  Diagnosis Date   Anemia    Asthma    CHF (congestive heart failure) ([place])    1991- unknown after asthma attack   Chicken pox    Complication of anesthesia 1998   woke up during procedure    COPD (chronic obstructive pulmonary disease) ([place])    Diabetes ([place])    Type II    Difficult 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    DVT (deep venous thrombosis) (HCC)    GERD (gastroesophageal reflux disease)    Hernia, hiatal 2007   HLD (hyperlipidemia)    Hypertension    Hypertension    Pneumonia    Seasonal allergies     Family History: Family History  Problem Relation Age of Onset   Hypertension Mother    Lung cancer Mother    Colon cancer Father    Colon polyps Sister    Diabetes Sister     Social History   Socioeconomic History   Marital status: Married    Spouse name: Not on file   Number of children: Not on file  Years of education: Not on file   Highest education level: Not on file  Occupational History   Not on file  Tobacco Use   Smoking status: Never   Smokeless tobacco: Never  Vaping Use   Vaping Use: Never used  Substance and Sexual Activity   Alcohol use: Yes    Comment: glass of wine rarely   Drug use: No   Sexual activity: Not on file  Other Topics Concern   Not on file  Social History Narrative   Lives at home with family. Independent   Social Determinants of Health   Financial Resource Strain: Not on file  Food Insecurity: Not on file  Transportation Needs: Not on file  Physical Activity: Not on file  Stress: Not on file  Social Connections: Not on file  Intimate Partner Violence: Not on file      Review of Systems  Constitutional:  Negative for chills, fatigue and unexpected weight change.  HENT:  Positive for congestion, ear pain, postnasal drip, rhinorrhea, sinus pressure and sinus pain. Negative for sneezing, sore throat and trouble swallowing.   Eyes:  Negative for redness.  Respiratory:   Positive for cough. Negative for chest tightness, shortness of breath and wheezing.   Cardiovascular: Negative.  Negative for chest pain and palpitations.  Gastrointestinal: Negative.  Negative for abdominal pain, constipation, diarrhea, nausea and vomiting.  Genitourinary:  Negative for dysuria and frequency.  Musculoskeletal:  Negative for arthralgias, back pain, joint swelling and neck pain.  Skin:  Negative for rash.  Neurological: Negative.  Negative for tremors and numbness.  Hematological:  Negative for adenopathy. Does not bruise/bleed easily.  Psychiatric/Behavioral:  Negative for behavioral problems (Depression), sleep disturbance and suicidal ideas. The patient is not nervous/anxious.     Vital Signs: BP 134/84   Pulse 84   Temp 97.8 F (36.6 C)   Resp 16   Ht 5' 3"  (1.6 m)   Wt 192 lb 6.4 oz (87.3 kg)   SpO2 96%   BMI 34.08 kg/m    Physical Exam Vitals reviewed.  Constitutional:      General: She is awake. She is not in acute distress.    Appearance: Normal appearance. She is well-developed and well-groomed. She is obese. She is not ill-appearing.  HENT:     Head: Normocephalic and atraumatic.     Right Ear: Tympanic membrane, ear canal and external ear normal. There is no impacted cerumen.     Left Ear: Tympanic membrane, ear canal and external ear normal. There is no impacted cerumen.     Nose: Nose normal. No congestion or rhinorrhea.     Mouth/Throat:     Mouth: Mucous membranes are moist.     Pharynx: No oropharyngeal exudate or posterior oropharyngeal erythema.  Eyes:     General: Lids are normal. Vision grossly intact. Gaze aligned appropriately.     Extraocular Movements: Extraocular movements intact.     Conjunctiva/sclera: Conjunctivae normal.     Pupils: Pupils are equal, round, and reactive to light.     Funduscopic exam:    Right eye: Red reflex present.        Left eye: Red reflex present. Neck:     Vascular: No carotid bruit.     Trachea:  Trachea and phonation normal.  Cardiovascular:     Rate and Rhythm: Normal rate and regular rhythm.     Pulses:          Dorsalis pedis pulses are 3+ on the right  side and 3+ on the left side.       Posterior tibial pulses are 2+ on the right side and 2+ on the left side.     Heart sounds: Normal heart sounds, S1 normal and S2 normal. No murmur heard.    No friction rub. No gallop.  Pulmonary:     Effort: Pulmonary effort is normal. No accessory muscle usage or respiratory distress.     Breath sounds: Normal breath sounds and air entry. No wheezing.  Abdominal:     General: Bowel sounds are normal.     Palpations: Abdomen is soft.  Musculoskeletal:        General: No signs of injury. Normal range of motion.     Cervical back: Normal range of motion and neck supple.     Right foot: Bunion present. No foot drop or prominent metatarsal heads.     Left foot: Bunion present. No foot drop or prominent metatarsal heads.  Feet:     Right foot:     Protective Sensation: 6 sites tested.  6 sites sensed.     Skin integrity: Erythema and callus present. No ulcer, blister, skin breakdown, dry skin or fissure.     Toenail Condition: Right toenails are abnormally thick.     Left foot:     Protective Sensation: 6 sites tested.  6 sites sensed.     Skin integrity: Erythema and callus present. No ulcer, blister, skin breakdown, dry skin or fissure.     Toenail Condition: Left toenails are abnormally thick.  Lymphadenopathy:     Cervical: No cervical adenopathy.  Skin:    General: Skin is warm and dry.     Capillary Refill: Capillary refill takes less than 2 seconds.  Neurological:     Mental Status: She is alert and oriented to person, place, and time.  Psychiatric:        Mood and Affect: Mood normal.        Behavior: Behavior normal. Behavior is cooperative.        Thought Content: Thought content normal.        Judgment: Judgment normal.       Assessment/Plan: 1. Encounter for general  adult medical examination with abnormal findings Age-appropriate preventive screenings and vaccinations discussed, annual physical exam completed. Routine labs for health maintenance ordered, see below. PHM updated.  - CBC with Differential/Platelet - CMP14+EGFR - Lipid Profile - Vitamin D (25 hydroxy) - B12 and Folate Panel  2. Uncontrolled type 2 diabetes mellitus with hyperglycemia (HCC) A1c is stable, no change in medications at this time, continue as prescribed - Microalbumin, urine - POCT HgB A1C  3. Mixed hyperlipidemia Routine labs ordered - CMP14+EGFR - Lipid Profile  4. Vitamin D deficiency Routine lab ordered - Vitamin D (25 hydroxy)  5. B12 deficiency Routine labs ordered - CBC with Differential/Platelet - B12 and Folate Panel  6. Dysuria Routine urinalysis done - UA/M w/rflx Culture, Routine - Microscopic Examination - Urine Culture, Reflex - Microalbumin, urine  7. Encounter for screening mammogram for malignant neoplasm of breast Routine mammogram ordered - MM 3D SCREEN BREAST BILATERAL; Future  8. Medication refill - theophylline (UNIPHYL) 600 MG 24 hr tablet; Take 1 tablet (600 mg total) by mouth daily.  Dispense: 90 tablet; Refill: 1 - pantoprazole (PROTONIX) 40 MG tablet; Take 1 tablet (40 mg total) by mouth 2 (two) times daily.  Dispense: 180 tablet; Refill: 1 - Budeson-Glycopyrrol-Formoterol (BREZTRI AEROSPHERE) 160-9-4.8 MCG/ACT AERO; Inhale 2 puffs into the  lungs 2 (two) times daily.  Dispense: 10.7 g; Refill: 5 - albuterol (VENTOLIN HFA) 108 (90 Base) MCG/ACT inhaler; Inhale 2 puffs into the lungs every 4 (four) hours as needed for wheezing or shortness of breath.  Dispense: 8 g; Refill: 5     General Counseling: Brenn verbalizes understanding of the findings of todays visit and agrees with plan of treatment. I have discussed any further diagnostic evaluation that may be needed or ordered today. We also reviewed her medications today. she has  been encouraged to call the office with any questions or concerns that should arise related to todays visit.    Orders Placed This Encounter  Procedures   MM 3D SCREEN BREAST BILATERAL   UA/M w/rflx Culture, Routine   Microalbumin, urine   CBC with Differential/Platelet   CMP14+EGFR   Lipid Profile   Vitamin D (25 hydroxy)   B12 and Folate Panel   POCT HgB A1C    Meds ordered this encounter  Medications   theophylline (UNIPHYL) 600 MG 24 hr tablet    Sig: Take 1 tablet (600 mg total) by mouth daily.    Dispense:  90 tablet    Refill:  1    Fill with what insurance covers; for future refills   pantoprazole (PROTONIX) 40 MG tablet    Sig: Take 1 tablet (40 mg total) by mouth 2 (two) times daily.    Dispense:  180 tablet    Refill:  1    For future refills   Budeson-Glycopyrrol-Formoterol (BREZTRI AEROSPHERE) 160-9-4.8 MCG/ACT AERO    Sig: Inhale 2 puffs into the lungs 2 (two) times daily.    Dispense:  10.7 g    Refill:  5    For future refills   albuterol (VENTOLIN HFA) 108 (90 Base) MCG/ACT inhaler    Sig: Inhale 2 puffs into the lungs every 4 (four) hours as needed for wheezing or shortness of breath.    Dispense:  8 g    Refill:  5    For future refills    Return in about 3 months (around 02/26/2022) for F/U, Recheck A1C, Sorah Falkenstein PCP.   Total time spent:30 Minutes Time spent includes review of chart, medications, test results, and follow up plan with the patient.   [place] Controlled Substance Database was reviewed by me.  This patient was seen by Jonetta Osgood, FNP-C in collaboration with Dr. Clayborn Bigness as a part of collaborative care agreement.  Haila Dena R. Valetta Fuller, MSN, FNP-C Internal medicine

## 2021-11-30 LAB — UA/M W/RFLX CULTURE, ROUTINE
Bilirubin, UA: NEGATIVE
Glucose, UA: NEGATIVE
Ketones, UA: NEGATIVE
Nitrite, UA: NEGATIVE
Protein,UA: NEGATIVE
RBC, UA: NEGATIVE
Specific Gravity, UA: 1.018 (ref 1.005–1.030)
Urobilinogen, Ur: 0.2 mg/dL (ref 0.2–1.0)
pH, UA: 5.5 (ref 5.0–7.5)

## 2021-11-30 LAB — URINE CULTURE, REFLEX: Organism ID, Bacteria: NO GROWTH

## 2021-11-30 LAB — MICROSCOPIC EXAMINATION
Bacteria, UA: NONE SEEN
Casts: NONE SEEN /lpf
Epithelial Cells (non renal): NONE SEEN /hpf (ref 0–10)

## 2021-11-30 LAB — MICROALBUMIN, URINE: Microalbumin, Urine: 4.8 ug/mL

## 2021-12-03 ENCOUNTER — Ambulatory Visit: Payer: HMO | Admitting: Nurse Practitioner

## 2021-12-18 ENCOUNTER — Ambulatory Visit
Admission: RE | Admit: 2021-12-18 | Discharge: 2021-12-18 | Disposition: A | Discharge status: Home / Self Care | Payer: HMO | Source: Ambulatory Visit | Attending: Nurse Practitioner | Admitting: Nurse Practitioner

## 2021-12-18 DIAGNOSIS — Z1231 Encounter for screening mammogram for malignant neoplasm of breast: Secondary | ICD-10-CM | POA: Diagnosis not present

## 2021-12-29 LAB — CBC WITH DIFFERENTIAL/PLATELET
Basophils Absolute: 0.1 10*3/uL (ref 0.0–0.2)
Basos: 1 %
EOS (ABSOLUTE): 0.5 10*3/uL — ABNORMAL HIGH (ref 0.0–0.4)
Eos: 6 %
Hematocrit: 42.1 % (ref 34.0–46.6)
Hemoglobin: 14.3 g/dL (ref 11.1–15.9)
Immature Grans (Abs): 0 10*3/uL (ref 0.0–0.1)
Immature Granulocytes: 1 %
Lymphocytes Absolute: 1.9 10*3/uL (ref 0.7–3.1)
Lymphs: 23 %
MCH: 31.1 pg (ref 26.6–33.0)
MCHC: 34 g/dL (ref 31.5–35.7)
MCV: 92 fL (ref 79–97)
Monocytes Absolute: 0.7 10*3/uL (ref 0.1–0.9)
Monocytes: 9 %
Neutrophils Absolute: 4.9 10*3/uL (ref 1.4–7.0)
Neutrophils: 60 %
Platelets: 316 10*3/uL (ref 150–450)
RBC: 4.6 x10E6/uL (ref 3.77–5.28)
RDW: 12.9 % (ref 11.7–15.4)
WBC: 8.1 10*3/uL (ref 3.4–10.8)

## 2021-12-29 LAB — CMP14+EGFR
ALT: 18 IU/L (ref 0–32)
AST: 16 IU/L (ref 0–40)
Albumin/Globulin Ratio: 2 (ref 1.2–2.2)
Albumin: 4.5 g/dL (ref 3.9–4.9)
Alkaline Phosphatase: 152 IU/L — ABNORMAL HIGH (ref 44–121)
BUN/Creatinine Ratio: 18 (ref 12–28)
BUN: 12 mg/dL (ref 8–27)
Bilirubin Total: 0.5 mg/dL (ref 0.0–1.2)
CO2: 25 mmol/L (ref 20–29)
Calcium: 9.8 mg/dL (ref 8.7–10.3)
Chloride: 102 mmol/L (ref 96–106)
Creatinine, Ser: 0.68 mg/dL (ref 0.57–1.00)
Globulin, Total: 2.2 g/dL (ref 1.5–4.5)
Glucose: 109 mg/dL — ABNORMAL HIGH (ref 70–99)
Potassium: 4.4 mmol/L (ref 3.5–5.2)
Sodium: 143 mmol/L (ref 134–144)
Total Protein: 6.7 g/dL (ref 6.0–8.5)
eGFR: 98 mL/min/{1.73_m2} (ref >59)

## 2021-12-29 LAB — LIPID PANEL
Chol/HDL Ratio: 2.7 ratio (ref 0.0–4.4)
Cholesterol, Total: 181 mg/dL (ref 100–199)
HDL: 66 mg/dL (ref >39)
LDL Chol Calc (NIH): 96 mg/dL (ref 0–99)
Triglycerides: 108 mg/dL (ref 0–149)
VLDL Cholesterol Cal: 19 mg/dL (ref 5–40)

## 2021-12-29 LAB — VITAMIN D 25 HYDROXY (VIT D DEFICIENCY, FRACTURES): Vit D, 25-Hydroxy: 38.2 ng/mL (ref 30.0–100.0)

## 2021-12-29 LAB — B12 AND FOLATE PANEL
Folate: >20 ng/mL (ref >3.0)
Vitamin B-12: 1072 pg/mL (ref 232–1245)

## 2022-01-04 ENCOUNTER — Ambulatory Visit: Payer: PPO | Admitting: Urology

## 2022-01-09 ENCOUNTER — Other Ambulatory Visit: Payer: Self-pay | Admitting: Internal Medicine

## 2022-01-09 ENCOUNTER — Encounter: Payer: Self-pay | Admitting: Urology

## 2022-01-09 ENCOUNTER — Ambulatory Visit: Payer: HMO | Admitting: Urology

## 2022-01-09 VITALS — BP 127/80 | HR 81 | Ht 63.0 in | Wt 194.0 lb

## 2022-01-09 DIAGNOSIS — N362 Urethral caruncle: Secondary | ICD-10-CM

## 2022-01-09 DIAGNOSIS — F411 Generalized anxiety disorder: Secondary | ICD-10-CM

## 2022-01-09 NOTE — Progress Notes (Signed)
01/09/2022 1:17 PM   Cassie Ross 08-Sep-1960 [id number]  Referring provider: Jonetta Osgood, NP [place],  [place] [address]  Chief Complaint  Patient presents with   Follow-up    HPI: 61 y.o. female presents for a 31-monthfollow-up visit.  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    DVT (deep venous thrombosis) (H[facility])    GERD (gastroesophageal reflux disease)    Hernia, hiatal 2007   HLD (hyperlipidemia)    Hypertension    Hypertension    Pneumonia    Seasonal allergies     Surgical History: Past Surgical History:  Procedure Laterality Date   BLADDER SUSPENSION N/A 05/23/2020   Procedure: TRANSVAGINAL TAPE (TVT) PROCEDURE;  Surgeon: HGae Dry MD;  Location: [facility];  Service: Gynecology;  Laterality: N/A;   cataract surgery     CHOLECYSTECTOMY     COLONOSCOPY WITH PROPOFOL N/A 01/06/2018   Procedure: COLONOSCOPY WITH PROPOFOL;  Surgeon: SLollie Sails MD;  [facility];  Service: Endoscopy;  Laterality: N/A;   CYSTOCELE REPAIR N/A 05/23/2020   Procedure: ANTERIOR REPAIR (CYSTOCELE);  Surgeon: HGae Dry MD;  Location: [facility];  Service: Gynecology;  Laterality: N/A;   CYSTOSCOPY N/A 05/23/2020   Procedure: CYSTOSCOPY;  Surgeon: HGae Dry MD;  Location: [facility];  Service: Gynecology;  Laterality: N/A;   ESOPHAGEAL DILATION     ESOPHAGOGASTRODUODENOSCOPY (EGD) WITH PROPOFOL N/A 01/06/2018   Procedure:  ESOPHAGOGASTRODUODENOSCOPY (EGD) WITH PROPOFOL;  Surgeon: SLollie Sails MD;  [facility];  Service: Endoscopy;  Laterality: N/A;   EYE SURGERY Bilateral 2014   cataract    HERNIA REPAIR     hiatial hernia repair     NASAL SINUS SURGERY     PUBOVAGINAL SLING N/A 05/23/2020   Procedure: PGaynelle Arabian  Surgeon: HGae Dry MD;  Location: [facility];  Service: Gynecology;  Laterality: N/A;   RECTOCELE REPAIR  05/23/2020   Procedure: POSTERIOR REPAIR (RECTOCELE);  Surgeon: HGae Dry MD;  Location: [facility];  Service: Gynecology;;   VAGINAL HYSTERECTOMY Bilateral 05/23/2020   Procedure: HYSTERECTOMY VAGINAL BILATERIAL SALPINGO OOPHORECTOMY;  Surgeon: HGae Dry MD;  Location: [facility];  Service: Gynecology;  Laterality: Bilateral;  LEFT fallopian tube, BILAT ovaries    Home Medications:  Allergies as of 01/09/2022       Reactions   Shellfish Allergy Swelling   Sunflower Oil Swelling   seeds   Levaquin [levofloxacin In D5w] Rash   Pill form   Levaquin [levofloxacin] Rash   Allergy to pill form only. Patient has had IV with no problem.   Penicillins Rash   Has patient had a PCN reaction causing immediate rash, facial/tongue/throat swelling, SOB or lightheadedness with hypotension: no Has patient had a PCN reaction causing severe rash involving mucus membranes or skin necrosis: no Has patient had a PCN reaction that required hospitalization no  Has patient had a PCN reaction occurring within the last 10 years: no If all of the above answers are "NO", then may proceed with Cephalosporin use.        Medication List        Accurate as of January 09, 2022  1:17 PM. If you have any questions, ask your nurse or doctor.          acetaminophen 500 MG tablet Commonly known as: TYLENOL Take 1,000 mg by mouth every 8 (eight) hours as needed for moderate pain.   albuterol (2.5 MG/3ML) 0.083% nebulizer solution Commonly known as: PROVENTIL Take 3  mLs (2.5 mg total) by nebulization every 6 (six) hours as needed for shortness of breath. USE 1 VIAL IN NEBULIZER EVERY 6 HOURS AS NEEDED   albuterol 108 (90 Base) MCG/ACT inhaler Commonly known as: VENTOLIN HFA Inhale 2 puffs into the lungs every 4 (four) hours as needed for wheezing or shortness of breath.   ALPRAZolam 0.5 MG tablet Commonly known as: XANAX Take 1 tablet by mouth twice daily as needed for anxiety   aspirin EC 81 MG tablet Take 81 mg by mouth daily.   atorvastatin 10 MG tablet Commonly known as: LIPITOR Take 10 mg by mouth daily.   Breztri Aerosphere 160-9-4.8 MCG/ACT Aero Generic drug: Budeson-Glycopyrrol-Formoterol Inhale 2 puffs into the lungs 2 (two) times daily.   Clobetasol Propionate E 0.05 % emollient cream Generic drug: Clobetasol Prop Emollient Base Apply topically 2 (two) times daily.   diltiazem 180 MG 24 hr capsule Commonly known as: CARDIZEM CD Take 1 capsule by mouth twice daily   estradiol 0.1 MG/GM vaginal cream Commonly known as: ESTRACE Apply a pea-sized amount to fingertip and apply to the urethral meatus twice weekly   fluticasone 50 MCG/ACT nasal spray Commonly known as: FLONASE Place 1 spray into both nostrils in the morning and at bedtime.   furosemide 40 MG tablet Commonly known as: LASIX Take 1 tablet (40 mg total) by mouth 2 (two) times daily as needed (fluid retention.).   ipratropium-albuterol 0.5-2.5 (3) MG/3ML Soln Commonly known as: DUONEB Inhale 3 mLs into the lungs every 4 (four) hours as needed (wheezing/shortness of breath). J44.0   Lantus SoloStar 100 UNIT/ML Solostar Pen Generic drug: insulin glargine Inject 10 Units into the skin at bedtime.   Leader Unifine Pentips 32G X 4 MM Misc Generic drug: Insulin Pen Needle USE AS DIRECTED WITH INSULIN   loratadine 10 MG tablet Commonly known as: CLARITIN Take 10 mg by mouth daily.   Lubricant Drops/Dual-Action 0.5-0.9 % ophthalmic solution Generic drug:  carboxymethylcellul-glycerin Place 1-2 drops into both eyes 3 (three) times daily as needed for dry eyes.   montelukast 10 MG tablet Commonly known as: SINGULAIR TAKE 1 TABLET BY MOUTH AT BEDTIME   multivitamin with minerals Tabs tablet Take 1 tablet by mouth daily.   NovoLOG FlexPen 100 UNIT/ML FlexPen Generic drug: insulin aspart Inject 2-10 Units into the skin as directed. Sliding scale - use as directed QAC per sliding scale instructions. Max daily dose is 25 units in 24 hours. E11.65   OneTouch Verio test strip Generic drug: glucose blood Use 1 strip to check glucose 4 times a daily as directed   OneTouch Verio w/Device Kit Use as directed diag   pantoprazole 40 MG tablet Commonly known as: PROTONIX Take 1 tablet (40 mg total) by mouth 2 (two) times daily.   potassium chloride 10 MEQ tablet Commonly known as: KLOR-CON M Take 1 tablet by  mouth twice daily   theophylline 600 MG 24 hr tablet Commonly known as: UNIPHYL Take 1 tablet (600 mg total) by mouth daily.        Allergies:  Allergies  Allergen Reactions   Shellfish Allergy Swelling   Sunflower Oil Swelling    seeds   Levaquin [Levofloxacin In D5w] Rash    Pill form   Levaquin [Levofloxacin] Rash    Allergy to pill form only. Patient has had IV with no problem.   Penicillins Rash    Has patient had a PCN reaction causing immediate rash, facial/tongue/throat swelling, SOB or lightheadedness with hypotension: no Has patient had a PCN reaction causing severe rash involving mucus membranes or skin necrosis: no Has patient had a PCN reaction that required hospitalization no Has patient had a PCN reaction occurring within the last 10 years: no If all of the above answers are "NO", then may proceed with Cephalosporin use.     Family History: Family History  Problem Relation Age of Onset   Hypertension Mother    Lung cancer Mother    Colon cancer Father    Colon polyps Sister    Diabetes Sister      Social History:  reports that she has never smoked. She has never used smokeless tobacco. She reports current alcohol use. She reports that she does not use drugs.   Physical Exam: BP 127/80   Pulse 81   Ht _0  (1.6 m)   Wt 194 lb (88 kg)   BMI 34.37 kg/m   Constitutional:  Alert and oriented, No acute distress. HEENT: [place] AT Respiratory: Normal respiratory effort, no increased work of breathing. GU: Decreased size of the urethral caruncle noted.  Still hyperemic. Psychiatric: Normal mood and affect.  Assessment & Plan:    1.  Urethral caruncle Asymptomatic Spotting has resolved and size decreased Will continue low-dose vaginal estrogen Follow-up prn    Abbie Sons, MD  [facility] [address] [place], [place] [address] [phone]

## 2022-01-18 ENCOUNTER — Encounter: Payer: Self-pay | Admitting: Nurse Practitioner

## 2022-02-06 ENCOUNTER — Other Ambulatory Visit: Payer: Self-pay | Admitting: Nurse Practitioner

## 2022-02-06 ENCOUNTER — Telehealth: Payer: Self-pay

## 2022-02-06 ENCOUNTER — Other Ambulatory Visit: Payer: Self-pay

## 2022-02-06 DIAGNOSIS — Z760 Encounter for issue of repeat prescription: Secondary | ICD-10-CM

## 2022-02-06 DIAGNOSIS — Z5181 Encounter for therapeutic drug level monitoring: Secondary | ICD-10-CM

## 2022-02-06 NOTE — Telephone Encounter (Signed)
Pt advised that we add order for theophylline level check please do lab before her appt

## 2022-02-07 LAB — THEOPHYLLINE LEVEL: Theophylline, Serum: 10.9 ug/mL (ref 10.0–20.0)

## 2022-02-15 NOTE — Progress Notes (Signed)
Discuss at upcoming visit

## 2022-02-25 ENCOUNTER — Ambulatory Visit: Payer: HMO | Admitting: Nurse Practitioner

## 2022-02-26 ENCOUNTER — Telehealth: Payer: Self-pay | Admitting: Nurse Practitioner

## 2022-02-26 ENCOUNTER — Ambulatory Visit (INDEPENDENT_AMBULATORY_CARE_PROVIDER_SITE_OTHER): Payer: HMO | Admitting: Nurse Practitioner

## 2022-02-26 ENCOUNTER — Encounter: Payer: Self-pay | Admitting: Nurse Practitioner

## 2022-02-26 VITALS — BP 145/78 | HR 84 | Temp 98.3°F | Resp 16 | Ht 63.0 in | Wt 188.2 lb

## 2022-02-26 DIAGNOSIS — K219 Gastro-esophageal reflux disease without esophagitis: Secondary | ICD-10-CM | POA: Diagnosis not present

## 2022-02-26 DIAGNOSIS — I10 Essential (primary) hypertension: Secondary | ICD-10-CM

## 2022-02-26 DIAGNOSIS — E1165 Type 2 diabetes mellitus with hyperglycemia: Secondary | ICD-10-CM

## 2022-02-26 DIAGNOSIS — L02415 Cutaneous abscess of right lower limb: Secondary | ICD-10-CM | POA: Diagnosis not present

## 2022-02-26 DIAGNOSIS — Z87898 Personal history of other specified conditions: Secondary | ICD-10-CM

## 2022-02-26 DIAGNOSIS — Z760 Encounter for issue of repeat prescription: Secondary | ICD-10-CM

## 2022-02-26 LAB — POCT GLYCOSYLATED HEMOGLOBIN (HGB A1C): Hemoglobin A1C: 6.4 % — AB (ref 4.0–5.6)

## 2022-02-26 MED ORDER — PREDNISONE 10 MG (21) PO TBPK: ORAL_TABLET | ORAL | 0 refills | Status: DC

## 2022-02-26 MED ORDER — SULFAMETHOXAZOLE-TRIMETHOPRIM 800-160 MG PO TABS: 1.0000 | ORAL_TABLET | Freq: Two times a day (BID) | ORAL | 0 refills | Status: AC

## 2022-02-26 MED ORDER — NITROGLYCERIN 0.4 MG SL SUBL: 0.4000 mg | SUBLINGUAL_TABLET | SUBLINGUAL | 3 refills | Status: DC | PRN

## 2022-02-26 NOTE — Telephone Encounter (Signed)
Received form from [employer] regarding insulin change. Gave to Alyssa to complete-Toni

## 2022-02-26 NOTE — Progress Notes (Unsigned)
[facility] [place], [place] [address]  Internal MEDICINE  Office Visit Note  Patient Name: Cassie Ross  [id number]  [id number]  Date of Service: 02/26/2022  Chief Complaint  Patient presents with   Follow-up   Diabetes   Gastroesophageal Reflux   Hypertension   Hyperlipidemia    HPI Cassie Ross presents for a follow up visit for diabetes, hypertension and hyperlipidemia.  Diabetes -- A1C 6.4, increased slightly. Lost 6 lbs  Hypertension -- stable Racing heart beat -- wants to have nitroglycerin on hand just in case  GERD - increased  Skin abscess -- right leg    Current Medication: Outpatient Encounter Medications as of 02/26/2022  Medication Sig   acetaminophen (TYLENOL) 500 MG tablet Take 1,000 mg by mouth every 8 (eight) hours as needed for moderate pain.    albuterol (PROVENTIL) (2.5 MG/3ML) 0.083% nebulizer solution Take 3 mLs (2.5 mg total) by nebulization every 6 (six) hours as needed for shortness of breath. USE 1 VIAL IN NEBULIZER EVERY 6 HOURS AS NEEDED   albuterol (VENTOLIN HFA) 108 (90 Base) MCG/ACT inhaler Inhale 2 puffs into the lungs every 4 (four) hours as needed for wheezing or shortness of breath.   ALPRAZolam (XANAX) 0.5 MG tablet Take 1 tablet by mouth twice daily as needed for anxiety   aspirin EC 81 MG tablet Take 81 mg by mouth daily.   atorvastatin (LIPITOR) 10 MG tablet Take 10 mg by mouth daily.   Blood Glucose Monitoring Suppl (ONETOUCH VERIO) w/Device KIT Use as directed diag   Budeson-Glycopyrrol-Formoterol (BREZTRI AEROSPHERE) 160-9-4.8 MCG/ACT AERO Inhale 2 puffs into the lungs 2 (two) times daily.   carboxymethylcellul-glycerin (LUBRICANT DROPS/DUAL-ACTION) 0.5-0.9 % ophthalmic solution Place 1-2 drops into both eyes 3 (three) times daily as needed for dry eyes.   Clobetasol Prop Emollient Base (CLOBETASOL PROPIONATE E) 0.05 % emollient cream Apply topically 2 (two) times daily.   diltiazem (CARDIZEM CD) 180 MG 24 hr  capsule Take 1 capsule by mouth twice daily   estradiol (ESTRACE) 0.1 MG/GM vaginal cream Apply a pea-sized amount to fingertip and apply to the urethral meatus twice weekly   fluticasone (FLONASE) 50 MCG/ACT nasal spray Place 1 spray into both nostrils in the morning and at bedtime.   furosemide (LASIX) 40 MG tablet Take 1 tablet (40 mg total) by mouth 2 (two) times daily as needed (fluid retention.).   glucose blood (ONETOUCH VERIO) test strip Use 1 strip to check glucose 4 times a daily as directed   insulin aspart (NOVOLOG FLEXPEN) 100 UNIT/ML FlexPen Inject 2-10 Units into the skin as directed. Sliding scale - use as directed QAC per sliding scale instructions. Max daily dose is 25 units in 24 hours. E11.65   insulin glargine (LANTUS SOLOSTAR) 100 UNIT/ML Solostar Pen Inject 10 Units into the skin at bedtime.   ipratropium-albuterol (DUONEB) 0.5-2.5 (3) MG/3ML SOLN Inhale 3 mLs into the lungs every 4 (four) hours as needed (wheezing/shortness of breath). 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   montelukast (SINGULAIR) 10 MG tablet TAKE 1 TABLET BY MOUTH AT BEDTIME   Multiple Vitamin (MULTIVITAMIN WITH MINERALS) TABS tablet Take 1 tablet by mouth daily.   nitroGLYCERIN (NITROSTAT) 0.4 MG SL tablet Place 1 tablet (0.4 mg total) under the tongue every 5 (five) minutes as needed for chest pain. X3 tablets then call 911 if  no relief.   pantoprazole (PROTONIX) 40 MG tablet Take 1 tablet (40 mg total) by mouth 2 (two) times daily.   potassium chloride (KLOR-CON M) 10 MEQ tablet Take 1 tablet by mouth twice daily   predniSONE (STERAPRED UNI-PAK 21 TAB) 10 MG (21) TBPK tablet Use as directed   sulfamethoxazole-trimethoprim (BACTRIM DS) 800-160 MG tablet Take 1 tablet by mouth 2 (two) times daily for 14 days.   theophylline (UNIPHYL) 600 MG 24 hr tablet TAKE ONE TABLET BY MOUTH DAILY   No  facility-administered encounter medications on file as of 02/26/2022.    Surgical History: Past Surgical History:  Procedure Laterality Date   BLADDER SUSPENSION N/A 05/23/2020   Procedure: TRANSVAGINAL TAPE (TVT) PROCEDURE;  Surgeon: Gae Dry, MD;  Location: [facility];  Service: Gynecology;  Laterality: N/A;   cataract surgery     CHOLECYSTECTOMY     COLONOSCOPY WITH PROPOFOL N/A 01/06/2018   Procedure: COLONOSCOPY WITH PROPOFOL;  Surgeon: Lollie Sails, MD;  [facility];  Service: Endoscopy;  Laterality: N/A;   CYSTOCELE REPAIR N/A 05/23/2020   Procedure: ANTERIOR REPAIR (CYSTOCELE);  Surgeon: Gae Dry, MD;  Location: [facility];  Service: Gynecology;  Laterality: N/A;   CYSTOSCOPY N/A 05/23/2020   Procedure: CYSTOSCOPY;  Surgeon: Gae Dry, MD;  Location: [facility];  Service: Gynecology;  Laterality: N/A;   ESOPHAGEAL DILATION     ESOPHAGOGASTRODUODENOSCOPY (EGD) WITH PROPOFOL N/A 01/06/2018   Procedure: ESOPHAGOGASTRODUODENOSCOPY (EGD) WITH PROPOFOL;  Surgeon: Lollie Sails, MD;  [facility];  Service: Endoscopy;  Laterality: N/A;   EYE SURGERY Bilateral 2014   cataract    HERNIA REPAIR     hiatial hernia repair     NASAL SINUS SURGERY     PUBOVAGINAL SLING N/A 05/23/2020   Procedure: Gaynelle Arabian;  Surgeon: Gae Dry, MD;  Location: [facility];  Service: Gynecology;  Laterality: N/A;   RECTOCELE REPAIR  05/23/2020   Procedure: POSTERIOR REPAIR (RECTOCELE);  Surgeon: Gae Dry, MD;  Location: [facility];  Service: Gynecology;;   VAGINAL HYSTERECTOMY Bilateral 05/23/2020   Procedure: HYSTERECTOMY VAGINAL BILATERIAL SALPINGO OOPHORECTOMY;  Surgeon: Gae Dry, MD;  Location: [facility];  Service: Gynecology;  Laterality: Bilateral;  LEFT fallopian tube, BILAT ovaries    Medical History: Past Medical History:  Diagnosis Date   Anemia    Asthma    CHF (congestive heart failure) ([place])    1991- unknown after  asthma attack   Chicken pox    Complication of anesthesia 1998   woke up during procedure    COPD (chronic obstructive pulmonary disease) ([place])    Diabetes ([place])    Type II    Difficult 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    DVT (deep venous thrombosis) (H[facility])    GERD (gastroesophageal reflux disease)    Hernia, hiatal 2007   HLD (hyperlipidemia)    Hypertension    Hypertension    Pneumonia    Seasonal allergies     Family History: Family History  Problem Relation Age of Onset   Hypertension Mother    Lung cancer Mother    Colon cancer Father    Colon polyps Sister    Diabetes Sister     Social History   Socioeconomic History   Marital status: Married    Spouse name: Not on file   Number of children: Not on file   Years of education: Not on file   Highest education level:  Not on file  Occupational History   Not on file  Tobacco Use   Smoking status: Never   Smokeless tobacco: Never  Vaping Use   Vaping Use: Never used  Substance and Sexual Activity   Alcohol use: Yes    Comment: glass of wine rarely   Drug use: No   Sexual activity: Not on file  Other Topics Concern   Not on file  Social History Narrative   Lives at home with family. Independent   Social Determinants of Radio broadcast assistant Strain: Not on file  Food Insecurity: Not on file  Transportation Needs: Not on file  Physical Activity: Not on file  Stress: Not on file  Social Connections: Not on file  Intimate Partner Violence: Not on file      Review of Systems  Vital Signs: BP (!) 145/78   Pulse 84   Temp 98.3 F (36.8 C)   Resp 16   Ht _0  (1.6 m)   Wt 188 lb 3.2 oz (85.4 kg)   SpO2 96%   BMI 33.34 kg/m    Physical Exam     Assessment/Plan:   General Counseling: Cassie Ross verbalizes understanding of the findings of todays visit and agrees with plan of treatment. I have discussed any further diagnostic evaluation that may be needed or ordered today. We  also reviewed her medications today. she has been encouraged to call the office with any questions or concerns that should arise related to todays visit.    Orders Placed This Encounter  Procedures   POCT glycosylated hemoglobin (Hb A1C)    Meds ordered this encounter  Medications   nitroGLYCERIN (NITROSTAT) 0.4 MG SL tablet    Sig: Place 1 tablet (0.4 mg total) under the tongue every 5 (five) minutes as needed for chest pain. X3 tablets then call 911 if no relief.    Dispense:  50 tablet    Refill:  3   sulfamethoxazole-trimethoprim (BACTRIM DS) 800-160 MG tablet    Sig: Take 1 tablet by mouth 2 (two) times daily for 14 days.    Dispense:  28 tablet    Refill:  0   predniSONE (STERAPRED UNI-PAK 21 TAB) 10 MG (21) TBPK tablet    Sig: Use as directed    Dispense:  21 each    Refill:  0    Return in about 3 months (around 05/28/2022) for F/U, Recheck A1C, Cathalina Barcia PCP and as needed for abscess.   Total time spent:*** Minutes Time spent includes review of chart, medications, test results, and follow up plan with the patient.   [place] Controlled Substance Database was reviewed by me.  This patient was seen by Jonetta Osgood, FNP-C in collaboration with Dr. Clayborn Bigness as a part of collaborative care agreement.   Aileen Amore R. Valetta Fuller, MSN, FNP-C Internal medicine

## 2022-03-07 ENCOUNTER — Other Ambulatory Visit: Payer: Self-pay | Admitting: Nurse Practitioner

## 2022-03-07 DIAGNOSIS — F411 Generalized anxiety disorder: Secondary | ICD-10-CM

## 2022-03-19 DIAGNOSIS — L810 Postinflammatory hyperpigmentation: Secondary | ICD-10-CM | POA: Diagnosis not present

## 2022-03-19 DIAGNOSIS — L921 Necrobiosis lipoidica, not elsewhere classified: Secondary | ICD-10-CM | POA: Diagnosis not present

## 2022-03-27 ENCOUNTER — Telehealth: Payer: Self-pay

## 2022-03-27 MED ORDER — MOLNUPIRAVIR EUA 200MG CAPSULE: 4.0000 | ORAL_CAPSULE | Freq: Two times a day (BID) | ORAL | 0 refills | Status: AC

## 2022-03-27 NOTE — Telephone Encounter (Signed)
Done

## 2022-04-04 ENCOUNTER — Other Ambulatory Visit: Payer: Self-pay | Admitting: Nurse Practitioner

## 2022-04-04 DIAGNOSIS — I10 Essential (primary) hypertension: Secondary | ICD-10-CM

## 2022-04-16 DIAGNOSIS — L921 Necrobiosis lipoidica, not elsewhere classified: Secondary | ICD-10-CM | POA: Diagnosis not present

## 2022-05-08 ENCOUNTER — Other Ambulatory Visit: Payer: Self-pay | Admitting: Internal Medicine

## 2022-05-08 DIAGNOSIS — F411 Generalized anxiety disorder: Secondary | ICD-10-CM

## 2022-05-08 NOTE — Telephone Encounter (Signed)
Last12/23 next 05/30/22

## 2022-05-30 ENCOUNTER — Ambulatory Visit (INDEPENDENT_AMBULATORY_CARE_PROVIDER_SITE_OTHER): Payer: PPO | Admitting: Nurse Practitioner

## 2022-05-30 ENCOUNTER — Encounter: Payer: Self-pay | Admitting: Nurse Practitioner

## 2022-05-30 VITALS — BP 145/74 | HR 95 | Temp 98.2°F | Resp 16 | Ht 63.0 in | Wt 190.6 lb

## 2022-05-30 DIAGNOSIS — J4540 Moderate persistent asthma, uncomplicated: Secondary | ICD-10-CM | POA: Diagnosis not present

## 2022-05-30 DIAGNOSIS — I10 Essential (primary) hypertension: Secondary | ICD-10-CM | POA: Diagnosis not present

## 2022-05-30 DIAGNOSIS — E1165 Type 2 diabetes mellitus with hyperglycemia: Secondary | ICD-10-CM

## 2022-05-30 DIAGNOSIS — J449 Chronic obstructive pulmonary disease, unspecified: Secondary | ICD-10-CM

## 2022-05-30 DIAGNOSIS — J4550 Severe persistent asthma, uncomplicated: Secondary | ICD-10-CM

## 2022-05-30 LAB — POCT GLYCOSYLATED HEMOGLOBIN (HGB A1C): Hemoglobin A1C: 6.6 % — AB (ref 4.0–5.6)

## 2022-05-30 MED ORDER — MONTELUKAST SODIUM 10 MG PO TABS: 10.0000 mg | ORAL_TABLET | Freq: Every day | ORAL | 1 refills | Status: DC

## 2022-05-30 NOTE — Progress Notes (Signed)
[facility] [place], [place] [address]  Internal MEDICINE  Office Visit Note  Patient Name: Cassie Ross  [id number]  [id number]  Date of Service: 05/30/2022  Chief Complaint  Patient presents with   Hyperlipidemia   Diabetes   Gastroesophageal Reflux   Hypertension    HPI Cassie Ross presents for a follow-up visit for diabetes Diabetes -- A1c slightly increased to 6.6. has not needed insulin for several months. Starting to walk more.  Allergic rhinitis -- need refills of montelukast.  Hypertension -- controlled on diltiazem and furosemide COPD -- takes breztri twice daily.    Current Medication: Outpatient Encounter Medications as of 05/30/2022  Medication Sig   acetaminophen (TYLENOL) 500 MG tablet Take 1,000 mg by mouth every 8 (eight) hours as needed for moderate pain.    albuterol (PROVENTIL) (2.5 MG/3ML) 0.083% nebulizer solution Take 3 mLs (2.5 mg total) by nebulization every 6 (six) hours as needed for shortness of breath. USE 1 VIAL IN NEBULIZER EVERY 6 HOURS AS NEEDED   albuterol (VENTOLIN HFA) 108 (90 Base) MCG/ACT inhaler Inhale 2 puffs into the lungs every 4 (four) hours as needed for wheezing or shortness of breath.   ALPRAZolam (XANAX) 0.5 MG tablet Take 1 tablet by mouth twice daily as needed for anxiety   aspirin EC 81 MG tablet Take 81 mg by mouth daily.   atorvastatin (LIPITOR) 10 MG tablet Take 10 mg by mouth daily.   Blood Glucose Monitoring Suppl (ONETOUCH VERIO) w/Device KIT Use as directed diag   Budeson-Glycopyrrol-Formoterol (BREZTRI AEROSPHERE) 160-9-4.8 MCG/ACT AERO Inhale 2 puffs into the lungs 2 (two) times daily.   carboxymethylcellul-glycerin (LUBRICANT DROPS/DUAL-ACTION) 0.5-0.9 % ophthalmic solution Place 1-2 drops into both eyes 3 (three) times daily as needed for dry eyes.   Clobetasol Prop Emollient Base (CLOBETASOL PROPIONATE E) 0.05 % emollient cream Apply topically 2 (two) times daily.   diltiazem (CARDIZEM CD) 180  MG 24 hr capsule Take 1 capsule by mouth twice daily   estradiol (ESTRACE) 0.1 MG/GM vaginal cream Apply a pea-sized amount to fingertip and apply to the urethral meatus twice weekly   fluticasone (FLONASE) 50 MCG/ACT nasal spray Place 1 spray into both nostrils in the morning and at bedtime.   furosemide (LASIX) 40 MG tablet Take 1 tablet (40 mg total) by mouth 2 (two) times daily as needed (fluid retention.).   glucose blood (ONETOUCH VERIO) test strip Use 1 strip to check glucose 4 times a daily as directed   ipratropium-albuterol (DUONEB) 0.5-2.5 (3) MG/3ML SOLN Inhale 3 mLs into the lungs every 4 (four) hours as needed (wheezing/shortness of breath). 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   Multiple Vitamin (MULTIVITAMIN WITH MINERALS) TABS tablet Take 1 tablet by mouth daily.   nitroGLYCERIN (NITROSTAT) 0.4 MG SL tablet Place 1 tablet (0.4 mg total) under the tongue every 5 (five) minutes as needed for chest pain. X3 tablets then call 911 if no relief.   pantoprazole (PROTONIX) 40 MG tablet Take 1 tablet (40 mg total) by mouth 2 (two) times daily.   potassium chloride (KLOR-CON M) 10 MEQ tablet Take 1 tablet by mouth twice daily   predniSONE (STERAPRED UNI-PAK 21 TAB) 10 MG (21) TBPK tablet Use as directed   theophylline (UNIPHYL) 600 MG 24 hr tablet TAKE ONE TABLET BY MOUTH DAILY   [DISCONTINUED] insulin aspart (NOVOLOG FLEXPEN)  100 UNIT/ML FlexPen Inject 2-10 Units into the skin as directed. Sliding scale - use as directed QAC per sliding scale instructions. Max daily dose is 25 units in 24 hours. E11.65   [DISCONTINUED] insulin glargine (LANTUS SOLOSTAR) 100 UNIT/ML Solostar Pen Inject 10 Units into the skin at bedtime.   [DISCONTINUED] montelukast (SINGULAIR) 10 MG tablet TAKE 1 TABLET BY MOUTH AT BEDTIME   montelukast (SINGULAIR) 10 MG tablet Take 1 tablet (10 mg total) by mouth at  bedtime.   No facility-administered encounter medications on file as of 05/30/2022.    Surgical History: Past Surgical History:  Procedure Laterality Date   BLADDER SUSPENSION N/A 05/23/2020   Procedure: TRANSVAGINAL TAPE (TVT) PROCEDURE;  Surgeon: Gae Dry, MD;  Location: [facility] ORS;  Service: Gynecology;  Laterality: N/A;   cataract surgery     CHOLECYSTECTOMY     COLONOSCOPY WITH PROPOFOL N/A 01/06/2018   Procedure: COLONOSCOPY WITH PROPOFOL;  Surgeon: Lollie Sails, MD;  Location: [facility] ENDOSCOPY;  Service: Endoscopy;  Laterality: N/A;   CYSTOCELE REPAIR N/A 05/23/2020   Procedure: ANTERIOR REPAIR (CYSTOCELE);  Surgeon: Gae Dry, MD;  Location: [facility] ORS;  Service: Gynecology;  Laterality: N/A;   CYSTOSCOPY N/A 05/23/2020   Procedure: CYSTOSCOPY;  Surgeon: Gae Dry, MD;  Location: [facility] ORS;  Service: Gynecology;  Laterality: N/A;   ESOPHAGEAL DILATION     ESOPHAGOGASTRODUODENOSCOPY (EGD) WITH PROPOFOL N/A 01/06/2018   Procedure: ESOPHAGOGASTRODUODENOSCOPY (EGD) WITH PROPOFOL;  Surgeon: Lollie Sails, MD;  Location: [facility] ENDOSCOPY;  Service: Endoscopy;  Laterality: N/A;   EYE SURGERY Bilateral 2014   cataract    HERNIA REPAIR     hiatial hernia repair     NASAL SINUS SURGERY     PUBOVAGINAL SLING N/A 05/23/2020   Procedure: Gaynelle Arabian;  Surgeon: Gae Dry, MD;  Location: [facility] ORS;  Service: Gynecology;  Laterality: N/A;   RECTOCELE REPAIR  05/23/2020   Procedure: POSTERIOR REPAIR (RECTOCELE);  Surgeon: Gae Dry, MD;  Location: [facility] ORS;  Service: Gynecology;;   VAGINAL HYSTERECTOMY Bilateral 05/23/2020   Procedure: HYSTERECTOMY VAGINAL BILATERIAL SALPINGO OOPHORECTOMY;  Surgeon: Gae Dry, MD;  Location: [facility] ORS;  Service: Gynecology;  Laterality: Bilateral;  LEFT fallopian tube, BILAT ovaries    Medical History: Past Medical History:  Diagnosis Date   Anemia    Asthma    CHF (congestive heart failure) ([place])    1991-  unknown after asthma attack   Chicken pox    Complication of anesthesia 1998   woke up during procedure    COPD (chronic obstructive pulmonary disease) ([place])    Diabetes ([place])    Type II    Difficult 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    DVT (deep venous thrombosis) (HCC)    GERD (gastroesophageal reflux disease)    Hernia, hiatal 2007   HLD (hyperlipidemia)    Hypertension    Hypertension    Pneumonia    Seasonal allergies     Family History: Family History  Problem Relation Age of Onset   Hypertension Mother    Lung cancer Mother    Colon cancer Father    Colon polyps Sister    Diabetes Sister     Social History   Socioeconomic History   Marital status: Married    Spouse name: Not on file   Number of children: Not on file   Years of education: Not on file   Highest education level: Not on file  Occupational History   Not on file  Tobacco Use   Smoking status: Never   Smokeless tobacco: Never  Vaping Use   Vaping Use: Never used  Substance and Sexual Activity   Alcohol use: Yes    Comment: glass of wine rarely   Drug use: No   Sexual activity: Not on file  Other Topics Concern   Not on file  Social History Narrative   Lives at home with family. Independent   Social Determinants of Health   Financial Resource Strain: Not on file  Food Insecurity: Not on file  Transportation Needs: Not on file  Physical Activity: Not on file  Stress: Not on file  Social Connections: Not on file  Intimate Partner Violence: Not on file      Review of Systems  Constitutional:  Negative for chills, fatigue and unexpected weight change.  HENT:  Positive for congestion, postnasal drip and rhinorrhea. Negative for ear pain, sneezing, sore throat and trouble swallowing.   Eyes:  Negative for redness.  Respiratory:  Positive for cough. Negative for chest tightness, shortness of breath and wheezing.   Cardiovascular: Negative.  Negative for chest pain and  palpitations.  Gastrointestinal: Negative.  Negative for abdominal pain, constipation, diarrhea, nausea and vomiting.  Genitourinary:  Negative for dysuria and frequency.  Musculoskeletal:  Negative for arthralgias, back pain, joint swelling and neck pain.  Skin:  Negative for rash.  Neurological: Negative.  Negative for tremors and numbness.  Hematological:  Negative for adenopathy. Does not bruise/bleed easily.  Psychiatric/Behavioral:  Negative for behavioral problems (Depression), sleep disturbance and suicidal ideas. The patient is not nervous/anxious.     Vital Signs: BP (!) 145/74   Pulse 95   Temp 98.2 F (36.8 C)   Resp 16   Ht 5\' 3"  (1.6 m)   Wt 190 lb 9.6 oz (86.5 kg)   SpO2 96%   BMI 33.76 kg/m    Physical Exam Vitals reviewed.  Constitutional:      General: She is not in acute distress.    Appearance: Normal appearance. She is obese. She is not ill-appearing.  HENT:     Head: Normocephalic and atraumatic.  Eyes:     Pupils: Pupils are equal, round, and reactive to light.  Cardiovascular:     Rate and Rhythm: Normal rate and regular rhythm.  Pulmonary:     Effort: Pulmonary effort is normal. No respiratory distress.  Neurological:     Mental Status: She is alert and oriented to person, place, and time.  Psychiatric:        Mood and Affect: Mood normal.        Behavior: Behavior normal.        Assessment/Plan: 1. Uncontrolled type 2 diabetes mellitus with hyperglycemia (HCC) Stable, continue current diet and lifestyle modifications. Not currently on any medications.  - POCT glycosylated hemoglobin (Hb A1C)  2. Moderate persistent asthma with allergic rhinitis without complication Continue montelukast as prescribed, refills ordered - montelukast (SINGULAIR) 10 MG tablet; Take 1 tablet (10 mg total) by mouth at bedtime.  Dispense: 90 tablet; Refill: 1  3. Essential hypertension Stable, continue medications as prescribed.    General Counseling:  Cassie Ross understanding of the findings of todays visit and agrees with plan of treatment. I have discussed any further diagnostic evaluation that may be needed or ordered today. We also reviewed her medications today. she has been encouraged to call the office with any questions or concerns that should arise related to  todays visit.    Orders Placed This Encounter  Procedures   POCT glycosylated hemoglobin (Hb A1C)    Meds ordered this encounter  Medications   montelukast (SINGULAIR) 10 MG tablet    Sig: Take 1 tablet (10 mg total) by mouth at bedtime.    Dispense:  90 tablet    Refill:  1    Return for previously scheduled, CPE, Detra Bores PCP in september. .   Total time spent:30 Minutes Time spent includes review of chart, medications, test results, and follow up plan with the patient.   [place] Controlled Substance Database was reviewed by me.  This patient was seen by Jonetta Osgood, FNP-C in collaboration with Dr. Clayborn Bigness as a part of collaborative care agreement.   Latyra Jaye R. Valetta Fuller, MSN, FNP-C Internal medicine

## 2022-06-02 ENCOUNTER — Encounter: Payer: Self-pay | Admitting: Nurse Practitioner

## 2022-06-03 DIAGNOSIS — Z794 Long term (current) use of insulin: Secondary | ICD-10-CM | POA: Diagnosis not present

## 2022-06-03 DIAGNOSIS — E119 Type 2 diabetes mellitus without complications: Secondary | ICD-10-CM | POA: Diagnosis not present

## 2022-06-03 DIAGNOSIS — J4550 Severe persistent asthma, uncomplicated: Secondary | ICD-10-CM | POA: Diagnosis not present

## 2022-06-03 DIAGNOSIS — E669 Obesity, unspecified: Secondary | ICD-10-CM | POA: Diagnosis not present

## 2022-06-03 DIAGNOSIS — I10 Essential (primary) hypertension: Secondary | ICD-10-CM | POA: Diagnosis not present

## 2022-06-03 DIAGNOSIS — Z86718 Personal history of other venous thrombosis and embolism: Secondary | ICD-10-CM | POA: Diagnosis not present

## 2022-06-03 DIAGNOSIS — E782 Mixed hyperlipidemia: Secondary | ICD-10-CM | POA: Diagnosis not present

## 2022-06-03 DIAGNOSIS — I270 Primary pulmonary hypertension: Secondary | ICD-10-CM | POA: Diagnosis not present

## 2022-06-22 ENCOUNTER — Other Ambulatory Visit: Payer: Self-pay | Admitting: Nurse Practitioner

## 2022-06-22 DIAGNOSIS — Z760 Encounter for issue of repeat prescription: Secondary | ICD-10-CM

## 2022-07-05 ENCOUNTER — Other Ambulatory Visit: Payer: Self-pay | Admitting: Internal Medicine

## 2022-07-05 DIAGNOSIS — F411 Generalized anxiety disorder: Secondary | ICD-10-CM

## 2022-07-08 NOTE — Telephone Encounter (Signed)
3/24 and 9/24

## 2022-07-10 ENCOUNTER — Telehealth: Payer: Self-pay | Admitting: Nurse Practitioner

## 2022-07-10 NOTE — Telephone Encounter (Signed)
04/16/21-05/30/22 MR faxed to [facility]; [phone]

## 2022-07-17 ENCOUNTER — Other Ambulatory Visit: Payer: Self-pay | Admitting: Nurse Practitioner

## 2022-07-22 ENCOUNTER — Other Ambulatory Visit: Payer: Self-pay | Admitting: Nurse Practitioner

## 2022-07-22 DIAGNOSIS — Z760 Encounter for issue of repeat prescription: Secondary | ICD-10-CM

## 2022-08-02 ENCOUNTER — Other Ambulatory Visit: Payer: Self-pay | Admitting: Nurse Practitioner

## 2022-08-02 DIAGNOSIS — Z760 Encounter for issue of repeat prescription: Secondary | ICD-10-CM

## 2022-09-06 ENCOUNTER — Other Ambulatory Visit: Payer: Self-pay | Admitting: Nurse Practitioner

## 2022-09-06 DIAGNOSIS — F411 Generalized anxiety disorder: Secondary | ICD-10-CM

## 2022-09-18 ENCOUNTER — Other Ambulatory Visit: Payer: Self-pay | Admitting: Nurse Practitioner

## 2022-09-18 DIAGNOSIS — Z760 Encounter for issue of repeat prescription: Secondary | ICD-10-CM

## 2022-09-28 ENCOUNTER — Other Ambulatory Visit: Payer: Self-pay | Admitting: Internal Medicine

## 2022-09-28 DIAGNOSIS — I10 Essential (primary) hypertension: Secondary | ICD-10-CM

## 2022-10-15 ENCOUNTER — Other Ambulatory Visit: Payer: Self-pay | Admitting: Nurse Practitioner

## 2022-10-15 DIAGNOSIS — Z760 Encounter for issue of repeat prescription: Secondary | ICD-10-CM

## 2022-11-04 ENCOUNTER — Other Ambulatory Visit: Payer: Self-pay | Admitting: Nurse Practitioner

## 2022-11-04 DIAGNOSIS — F411 Generalized anxiety disorder: Secondary | ICD-10-CM

## 2022-11-04 DIAGNOSIS — J4540 Moderate persistent asthma, uncomplicated: Secondary | ICD-10-CM

## 2022-11-04 NOTE — Telephone Encounter (Signed)
Pt last seen 3/24 and next appt 11/23/22

## 2022-11-07 ENCOUNTER — Other Ambulatory Visit: Payer: Self-pay | Admitting: Nurse Practitioner

## 2022-11-07 DIAGNOSIS — F411 Generalized anxiety disorder: Secondary | ICD-10-CM

## 2022-11-16 ENCOUNTER — Other Ambulatory Visit: Payer: Self-pay | Admitting: Nurse Practitioner

## 2022-11-16 DIAGNOSIS — Z760 Encounter for issue of repeat prescription: Secondary | ICD-10-CM

## 2022-12-03 ENCOUNTER — Encounter: Payer: Self-pay | Admitting: Nurse Practitioner

## 2022-12-03 ENCOUNTER — Ambulatory Visit (INDEPENDENT_AMBULATORY_CARE_PROVIDER_SITE_OTHER): Payer: PPO | Admitting: Nurse Practitioner

## 2022-12-03 VITALS — BP 130/80 | HR 72 | Temp 98.4°F | Resp 16 | Ht 63.0 in | Wt 186.4 lb

## 2022-12-03 DIAGNOSIS — I10 Essential (primary) hypertension: Secondary | ICD-10-CM | POA: Diagnosis not present

## 2022-12-03 DIAGNOSIS — E1169 Type 2 diabetes mellitus with other specified complication: Secondary | ICD-10-CM | POA: Diagnosis not present

## 2022-12-03 DIAGNOSIS — J449 Chronic obstructive pulmonary disease, unspecified: Secondary | ICD-10-CM | POA: Diagnosis not present

## 2022-12-03 DIAGNOSIS — Z0000 Encounter for general adult medical examination without abnormal findings: Secondary | ICD-10-CM | POA: Diagnosis not present

## 2022-12-03 DIAGNOSIS — J4540 Moderate persistent asthma, uncomplicated: Secondary | ICD-10-CM

## 2022-12-03 DIAGNOSIS — E785 Hyperlipidemia, unspecified: Secondary | ICD-10-CM

## 2022-12-03 DIAGNOSIS — F411 Generalized anxiety disorder: Secondary | ICD-10-CM

## 2022-12-03 DIAGNOSIS — K219 Gastro-esophageal reflux disease without esophagitis: Secondary | ICD-10-CM

## 2022-12-03 DIAGNOSIS — E1165 Type 2 diabetes mellitus with hyperglycemia: Secondary | ICD-10-CM

## 2022-12-03 DIAGNOSIS — Z23 Encounter for immunization: Secondary | ICD-10-CM | POA: Diagnosis not present

## 2022-12-03 DIAGNOSIS — Z0001 Encounter for general adult medical examination with abnormal findings: Secondary | ICD-10-CM

## 2022-12-03 DIAGNOSIS — Z79899 Other long term (current) drug therapy: Secondary | ICD-10-CM

## 2022-12-03 DIAGNOSIS — E782 Mixed hyperlipidemia: Secondary | ICD-10-CM

## 2022-12-03 MED ORDER — ALPRAZOLAM 0.5 MG PO TABS: 0.5000 mg | ORAL_TABLET | Freq: Two times a day (BID) | ORAL | 2 refills | Status: DC | PRN

## 2022-12-03 NOTE — Progress Notes (Signed)
[facility] [address] [place], [place] [address]  Internal MEDICINE  Office Visit Note  Patient Name: Cassie Ross  [id number]  [id number]  Date of Service: 12/03/2022  Chief Complaint  Patient presents with   Diabetes   Gastroesophageal Reflux   Hypertension   Hyperlipidemia   Medicare Wellness    HPI Cassie Ross presents for an annual well visit. Well-appearing 62 y.o. female with hypertension, asthma, COPD, GERD, drug induced diabetes, osteoarthritis and GAD  Taking breztri, breathing is still very much controlled.  Routine CRC screening: due in 2031 Routine mammogram: due in october DEXA scan: due in 2 years Pap smear: due in 2025, sees OBGYN Eye exam: see eye doctor regularly for diabetic eye exam.  Foot exam: done today  Labs: due for labs, will order  New or worsening pain: none   The 10-year ASCVD risk score (Arnett DK, et al., 2019) is: 9.2%   Values used to calculate the score:     Age: 17 years     Sex: Female     Is Non-Hispanic African American: No     Diabetic: Yes     Tobacco smoker: No     Systolic Blood Pressure: 130 mmHg     Is BP treated: Yes     HDL Cholesterol: 87 mg/dL     Total Cholesterol: 196 mg/dL  Social Determinants of Health with Concerns   Physical Activity: Insufficiently Active (12/03/2022)   Exercise Vital Sign    Days of Exercise per Week: 3 days    Minutes of Exercise per Session: 20 min  Stress: Stress Concern Present (12/03/2022)   [employer] of Occupational Health - Occupational Stress Questionnaire    Feeling of Stress : To some extent        12/03/2022    2:05 PM 11/27/2021    2:06 PM 10/12/2020   11:14 AM  MMSE - Mini Mental State Exam  Orientation to time 5 5 5   Orientation to Place 5 5 5   Registration 3 3 3   Attention/ Calculation 5 5 5   Recall 3 3 3   Language- name 2 objects 2 2 2   Language- repeat 1 1 1   Language- follow 3 step command 3 3 3   Language- read & follow direction 1 1 1    Write a sentence 1 1 1   Copy design 1 1 1   Total score 30 30 30     Functional Status Survey: Is the patient deaf or have difficulty hearing?: No Does the patient have difficulty seeing, even when wearing glasses/contacts?: No Does the patient have difficulty concentrating, remembering, or making decisions?: No Does the patient have difficulty walking or climbing stairs?: No Does the patient have difficulty dressing or bathing?: No Does the patient have difficulty doing errands alone such as visiting a doctor's office or shopping?: No     05/22/2021    9:37 AM 09/03/2021   10:38 AM 11/27/2021    2:04 PM 05/30/2022    1:47 PM 12/03/2022    2:04 PM  Fall Risk  Falls in the past year? 1 0 0 0 0  Was there an injury with Fall? 0  0 0 0  Fall Risk Category Calculator 1  0 0 0  Fall Risk Category (Retired) Low  Low    (RETIRED) Patient Fall Risk Level Low fall risk  Low fall risk    Patient at Risk for Falls Due to   No Fall Risks No Fall Risks No Fall Risks  Fall risk Follow up Falls evaluation completed  Falls evaluation completed Falls evaluation completed Falls evaluation completed       12/03/2022    2:04 PM  Depression screen PHQ 2/9  Decreased Interest 0  Down, Depressed, Hopeless 0  PHQ - 2 Score 0        No data to display            Current Medication: Outpatient Encounter Medications as of 12/03/2022  Medication Sig   acetaminophen (TYLENOL) 500 MG tablet Take 1,000 mg by mouth every 8 (eight) hours as needed for moderate pain.    albuterol (PROVENTIL) (2.5 MG/3ML) 0.083% nebulizer solution Take 3 mLs (2.5 mg total) by nebulization every 6 (six) hours as needed for shortness of breath. USE 1 VIAL IN NEBULIZER EVERY 6 HOURS AS NEEDED   albuterol (VENTOLIN HFA) 108 (90 Base) MCG/ACT inhaler Inhale 2 puffs into the lungs every 4 (four) hours as needed for wheezing or shortness of breath.   aspirin EC 81 MG tablet Take 81 mg by mouth daily.   atorvastatin (LIPITOR)  10 MG tablet Take 10 mg by mouth daily.   Blood Glucose Monitoring Suppl (ONETOUCH VERIO) w/Device KIT Use as directed diag   Budeson-Glycopyrrol-Formoterol (BREZTRI AEROSPHERE) 160-9-4.8 MCG/ACT AERO INHALE 2 PUFFS TWICE DAILY   carboxymethylcellul-glycerin (LUBRICANT DROPS/DUAL-ACTION) 0.5-0.9 % ophthalmic solution Place 1-2 drops into both eyes 3 (three) times daily as needed for dry eyes.   Clobetasol Prop Emollient Base (CLOBETASOL PROPIONATE E) 0.05 % emollient cream Apply topically 2 (two) times daily.   estradiol (ESTRACE) 0.1 MG/GM vaginal cream Apply a pea-sized amount to fingertip and apply to the urethral meatus twice weekly   fluticasone (FLONASE) 50 MCG/ACT nasal spray Place 1 spray into both nostrils in the morning and at bedtime.   furosemide (LASIX) 40 MG tablet Take 1 tablet (40 mg total) by mouth 2 (two) times daily as needed (fluid retention.).   glucose blood (ONETOUCH VERIO) test strip USE 1 STRIP TO CHECK GLUCOSE 4 TIMES DAILY AS DIRECTED   ipratropium-albuterol (DUONEB) 0.5-2.5 (3) MG/3ML SOLN Inhale 3 mLs into the lungs every 4 (four) hours as needed (wheezing/shortness of breath). 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   montelukast (SINGULAIR) 10 MG tablet TAKE 1 TABLET BY MOUTH AT BEDTIME   Multiple Vitamin (MULTIVITAMIN WITH MINERALS) TABS tablet Take 1 tablet by mouth daily.   nitroGLYCERIN (NITROSTAT) 0.4 MG SL tablet Place 1 tablet (0.4 mg total) under the tongue every 5 (five) minutes as needed for chest pain. X3 tablets then call 911 if no relief.   pantoprazole (PROTONIX) 40 MG tablet Take 1 tablet (40 mg total) by mouth 2 (two) times daily.   potassium chloride (KLOR-CON M) 10 MEQ tablet Take 1 tablet by mouth twice daily   theophylline (UNIPHYL) 600 MG 24 hr tablet TAKE ONE TABLET BY MOUTH ONE TIME DAILY   [DISCONTINUED] ALPRAZolam (XANAX) 0.5 MG tablet Take 1  tablet by mouth twice daily as needed for anxiety   [DISCONTINUED] diltiazem (CARDIZEM CD) 180 MG 24 hr capsule Take 1 capsule by mouth twice daily   [DISCONTINUED] ALPRAZolam (XANAX) 0.5 MG tablet Take 1 tablet (0.5 mg total) by mouth 2 (two) times daily as needed. for anxiety. May take 1 extra tab for severe anxiety/panic daily as needed   No facility-administered encounter medications on file as of  12/03/2022.    Surgical History: Past Surgical History:  Procedure Laterality Date   BLADDER SUSPENSION N/A 05/23/2020   Procedure: TRANSVAGINAL TAPE (TVT) PROCEDURE;  Surgeon: Nadara Mustard, MD;  Location: [facility] ORS;  Service: Gynecology;  Laterality: N/A;   cataract surgery     CHOLECYSTECTOMY     COLONOSCOPY WITH PROPOFOL N/A 01/06/2018   Procedure: COLONOSCOPY WITH PROPOFOL;  Surgeon: Christena Deem, MD;  Location: [facility] ENDOSCOPY;  Service: Endoscopy;  Laterality: N/A;   CYSTOCELE REPAIR N/A 05/23/2020   Procedure: ANTERIOR REPAIR (CYSTOCELE);  Surgeon: Nadara Mustard, MD;  Location: [facility] ORS;  Service: Gynecology;  Laterality: N/A;   CYSTOSCOPY N/A 05/23/2020   Procedure: CYSTOSCOPY;  Surgeon: Nadara Mustard, MD;  Location: [facility] ORS;  Service: Gynecology;  Laterality: N/A;   ESOPHAGEAL DILATION     ESOPHAGOGASTRODUODENOSCOPY (EGD) WITH PROPOFOL N/A 01/06/2018   Procedure: ESOPHAGOGASTRODUODENOSCOPY (EGD) WITH PROPOFOL;  Surgeon: Christena Deem, MD;  Location: [facility] ENDOSCOPY;  Service: Endoscopy;  Laterality: N/A;   EYE SURGERY Bilateral 2014   cataract    HERNIA REPAIR     hiatial hernia repair     NASAL SINUS SURGERY     PUBOVAGINAL SLING N/A 05/23/2020   Procedure: Leonides Grills;  Surgeon: Nadara Mustard, MD;  Location: [facility] ORS;  Service: Gynecology;  Laterality: N/A;   RECTOCELE REPAIR  05/23/2020   Procedure: POSTERIOR REPAIR (RECTOCELE);  Surgeon: Nadara Mustard, MD;  Location: [facility] ORS;  Service: Gynecology;;   VAGINAL HYSTERECTOMY Bilateral 05/23/2020    Procedure: HYSTERECTOMY VAGINAL BILATERIAL SALPINGO OOPHORECTOMY;  Surgeon: Nadara Mustard, MD;  Location: [facility] ORS;  Service: Gynecology;  Laterality: Bilateral;  LEFT fallopian tube, BILAT ovaries    Medical History: 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    DVT (deep venous thrombosis) (HCC)    GERD (gastroesophageal reflux disease)    Hernia, hiatal 2007   HLD (hyperlipidemia)    Hypertension    Hypertension    Pneumonia    Seasonal allergies     Family History: Family History  Problem Relation Age of Onset   Hypertension Mother    Lung cancer Mother    Colon cancer Father    Colon polyps Sister    Diabetes Sister     Social History   Socioeconomic History   Marital status: Married    Spouse name: Not on file   Number of children: Not on file   Years of education: Not on file   Highest education level: Not on file  Occupational History   Not on file  Tobacco Use   Smoking status: Never   Smokeless tobacco: Never  Vaping Use   Vaping status: Never Used  Substance and Sexual Activity   Alcohol use: Yes    Comment: glass of wine rarely   Drug use: No   Sexual activity: Not on file  Other Topics Concern   Not on file  Social History Narrative   Lives at home with family. Independent   Social Determinants of Health   Financial Resource Strain: Low Risk  (  12/03/2022)   Overall Financial Resource Strain (CARDIA)    Difficulty of Paying Living Expenses: Not hard at all  Food Insecurity: No Food Insecurity (12/03/2022)   Hunger Vital Sign    Worried About Running Out of Food in the Last Year: Never true    Ran Out of Food in the Last Year: Never  true  Transportation Needs: No Transportation Needs (12/03/2022)   PRAPARE - Administrator, Civil Service (Medical): No    Lack of Transportation (Non-Medical): No  Physical Activity: Insufficiently Active (12/03/2022)   Exercise Vital Sign    Days of Exercise per Week: 3 days    Minutes of Exercise per Session: 20 min  Stress: Stress Concern Present (12/03/2022)   [employer] of Occupational Health - Occupational Stress Questionnaire    Feeling of Stress : To some extent  Social Connections: Moderately Integrated (12/03/2022)   Social Connection and Isolation Panel [NHANES]    Frequency of Communication with Friends and Family: More than three times a week    Frequency of Social Gatherings with Friends and Family: Once a week    Attends Religious Services: More than 4 times per year    Active Member of [employer] or Organizations: No    Attends Banker Meetings: Never    Marital Status: Married  Catering manager Violence: Not At Risk (12/03/2022)   Humiliation, Afraid, Rape, and Kick questionnaire    Fear of Current or Ex-Partner: No    Emotionally Abused: No    Physically Abused: No    Sexually Abused: No      Review of Systems  Constitutional:  Negative for chills, fatigue and unexpected weight change.  HENT:  Negative for congestion, ear pain, postnasal drip, rhinorrhea, sinus pressure, sinus pain, sneezing, sore throat and trouble swallowing.   Eyes:  Negative for redness.  Respiratory:  Negative for cough, chest tightness, shortness of breath and wheezing.   Cardiovascular: Negative.  Negative for chest pain and palpitations.  Gastrointestinal: Negative.  Negative for abdominal pain, constipation, diarrhea, nausea and vomiting.  Genitourinary:  Negative for dysuria and frequency.  Musculoskeletal:  Negative for arthralgias, back pain, joint swelling and neck pain.  Skin:  Negative for rash.  Neurological: Negative.  Negative for tremors and numbness.   Hematological:  Negative for adenopathy. Does not bruise/bleed easily.  Psychiatric/Behavioral:  Negative for behavioral problems (Depression), sleep disturbance and suicidal ideas. The patient is not nervous/anxious.     Vital Signs: BP 130/80 Comment: 140/82  Pulse 72   Temp 98.4 F (36.9 C)   Resp 16   Ht 5\' 3"  (1.6 m)   Wt 186 lb 6.4 oz (84.6 kg)   SpO2 96%   BMI 33.02 kg/m    Physical Exam Constitutional:      General: She is not in acute distress.    Appearance: Normal appearance. She is obese. She is not ill-appearing.  HENT:     Head: Normocephalic and atraumatic.  Eyes:     Pupils: Pupils are equal, round, and reactive to light.  Cardiovascular:     Rate and Rhythm: Normal rate and regular rhythm.     Pulses: Normal pulses.     Heart sounds: Normal heart sounds. No murmur heard. Pulmonary:     Effort: Pulmonary effort is normal. No respiratory distress.     Breath sounds: Normal breath sounds.  Neurological:     Mental Status: She is alert and oriented to person, place, and time.  Psychiatric:  Mood and Affect: Mood normal.        Behavior: Behavior normal.        Assessment/Plan: 1. Encounter for subsequent annual wellness visit (AWV) in Medicare patient Age-appropriate preventive screenings and vaccinations discussed, annual physical exam completed. Routine labs for health maintenance will be ordered. PHM updated.   2. Type 2 diabetes mellitus with other specified complication, without long-term current use of insulin (HCC) Diet controlled, officially off all diabetic medications, will continue to monitor   3. Chronic obstructive pulmonary disease, unspecified COPD type (HCC) Stable, on breztri  4. Hyperlipidemia associated with type 2 diabetes mellitus (HCC) Continue atorvastatin as prescribed   5. Moderate persistent asthma with allergic rhinitis without complication Stable, on breztri  6. Essential hypertension Stable on current  medications   7. Needs flu shot Flu vaccine administered in office today  - Influenza, MDCK, trivalent, PF(Flucelvax egg-free)  8. Generalized anxiety disorder Continue prn alprazolam as prescribed      General Counseling: Shaun verbalizes understanding of the findings of todays visit and agrees with plan of treatment. I have discussed any further diagnostic evaluation that may be needed or ordered today. We also reviewed her medications today. she has been encouraged to call the office with any questions or concerns that should arise related to todays visit.    Orders Placed This Encounter  Procedures   Influenza, MDCK, trivalent, PF(Flucelvax egg-free)    Meds ordered this encounter  Medications   DISCONTD: ALPRAZolam (XANAX) 0.5 MG tablet    Sig: Take 1 tablet (0.5 mg total) by mouth 2 (two) times daily as needed. for anxiety. May take 1 extra tab for severe anxiety/panic daily as needed    Dispense:  35 tablet    Refill:  2    Fill script today    Return in about 12 weeks (around 02/25/2023) for F/U, anxiety med refill, Chontel Warning PCP.   Total time spent:30 Minutes Time spent includes review of chart, medications, test results, and follow up plan with the patient.   [place] Controlled Substance Database was reviewed by me.  This patient was seen by Sallyanne Kuster, FNP-C in collaboration with Dr. Beverely Risen as a part of collaborative care agreement.  Santia Labate R. Tedd Sias, MSN, FNP-C Internal medicine

## 2022-12-09 ENCOUNTER — Telehealth: Payer: Self-pay

## 2022-12-09 DIAGNOSIS — F411 Generalized anxiety disorder: Secondary | ICD-10-CM

## 2022-12-18 MED ORDER — ALPRAZOLAM 0.5 MG PO TABS: 0.5000 mg | ORAL_TABLET | Freq: Two times a day (BID) | ORAL | 2 refills | Status: DC | PRN

## 2022-12-18 NOTE — Telephone Encounter (Signed)
Patient notified

## 2022-12-18 NOTE — Telephone Encounter (Signed)
Left message for patient to give office a call.

## 2022-12-23 DIAGNOSIS — M1711 Unilateral primary osteoarthritis, right knee: Secondary | ICD-10-CM | POA: Diagnosis not present

## 2022-12-28 ENCOUNTER — Other Ambulatory Visit: Payer: Self-pay | Admitting: Internal Medicine

## 2022-12-28 DIAGNOSIS — I10 Essential (primary) hypertension: Secondary | ICD-10-CM

## 2022-12-30 ENCOUNTER — Other Ambulatory Visit: Payer: Self-pay

## 2022-12-30 DIAGNOSIS — I10 Essential (primary) hypertension: Secondary | ICD-10-CM

## 2022-12-30 MED ORDER — DILTIAZEM HCL ER COATED BEADS 180 MG PO CP24: 180.0000 mg | ORAL_CAPSULE | Freq: Two times a day (BID) | ORAL | 0 refills | Status: DC

## 2023-01-10 ENCOUNTER — Other Ambulatory Visit: Payer: Self-pay | Admitting: Nurse Practitioner

## 2023-01-10 DIAGNOSIS — E782 Mixed hyperlipidemia: Secondary | ICD-10-CM | POA: Diagnosis not present

## 2023-01-10 DIAGNOSIS — M1711 Unilateral primary osteoarthritis, right knee: Secondary | ICD-10-CM | POA: Diagnosis not present

## 2023-01-10 DIAGNOSIS — Z5181 Encounter for therapeutic drug level monitoring: Secondary | ICD-10-CM | POA: Diagnosis not present

## 2023-01-10 DIAGNOSIS — J4540 Moderate persistent asthma, uncomplicated: Secondary | ICD-10-CM | POA: Diagnosis not present

## 2023-01-10 DIAGNOSIS — E559 Vitamin D deficiency, unspecified: Secondary | ICD-10-CM | POA: Diagnosis not present

## 2023-01-10 DIAGNOSIS — E1165 Type 2 diabetes mellitus with hyperglycemia: Secondary | ICD-10-CM | POA: Diagnosis not present

## 2023-01-10 DIAGNOSIS — I10 Essential (primary) hypertension: Secondary | ICD-10-CM | POA: Diagnosis not present

## 2023-01-10 DIAGNOSIS — D538 Other specified nutritional anemias: Secondary | ICD-10-CM | POA: Diagnosis not present

## 2023-01-10 DIAGNOSIS — Z79899 Other long term (current) drug therapy: Secondary | ICD-10-CM | POA: Diagnosis not present

## 2023-01-11 LAB — CBC WITH DIFFERENTIAL/PLATELET
Basophils Absolute: 0 x10E3/uL (ref 0.0–0.2)
Basos: 1 %
EOS (ABSOLUTE): 0.2 x10E3/uL (ref 0.0–0.4)
Eos: 2 %
Hematocrit: 42.7 % (ref 34.0–46.6)
Hemoglobin: 13.9 g/dL (ref 11.1–15.9)
Immature Grans (Abs): 0.1 x10E3/uL (ref 0.0–0.1)
Immature Granulocytes: 1 %
Lymphocytes Absolute: 1.5 x10E3/uL (ref 0.7–3.1)
Lymphs: 19 %
MCH: 30.9 pg (ref 26.6–33.0)
MCHC: 32.6 g/dL (ref 31.5–35.7)
MCV: 95 fL (ref 79–97)
Monocytes Absolute: 0.7 x10E3/uL (ref 0.1–0.9)
Monocytes: 8 %
Neutrophils Absolute: 5.8 x10E3/uL (ref 1.4–7.0)
Neutrophils: 69 %
Platelets: 289 x10E3/uL (ref 150–450)
RBC: 4.5 x10E6/uL (ref 3.77–5.28)
RDW: 13 % (ref 11.7–15.4)
WBC: 8.2 x10E3/uL (ref 3.4–10.8)

## 2023-01-11 LAB — COMPREHENSIVE METABOLIC PANEL
ALT: 21 [IU]/L (ref 0–32)
AST: 18 [IU]/L (ref 0–40)
Albumin: 4.2 g/dL (ref 3.9–4.9)
Alkaline Phosphatase: 161 [IU]/L — ABNORMAL HIGH (ref 44–121)
BUN/Creatinine Ratio: 20 (ref 12–28)
BUN: 14 mg/dL (ref 8–27)
Bilirubin Total: 0.4 mg/dL (ref 0.0–1.2)
CO2: 25 mmol/L (ref 20–29)
Calcium: 9.2 mg/dL (ref 8.7–10.3)
Chloride: 104 mmol/L (ref 96–106)
Creatinine, Ser: 0.69 mg/dL (ref 0.57–1.00)
Globulin, Total: 2.4 g/dL (ref 1.5–4.5)
Glucose: 119 mg/dL — ABNORMAL HIGH (ref 70–99)
Potassium: 4.1 mmol/L (ref 3.5–5.2)
Sodium: 143 mmol/L (ref 134–144)
Total Protein: 6.6 g/dL (ref 6.0–8.5)
eGFR: 97 mL/min/{1.73_m2} (ref >59)

## 2023-01-11 LAB — LIPID PANEL
Chol/HDL Ratio: 2.3 ratio (ref 0.0–4.4)
Cholesterol, Total: 196 mg/dL (ref 100–199)
HDL: 87 mg/dL
LDL Chol Calc (NIH): 93 mg/dL (ref 0–99)
Triglycerides: 92 mg/dL (ref 0–149)
VLDL Cholesterol Cal: 16 mg/dL (ref 5–40)

## 2023-01-11 LAB — VITAMIN D 25 HYDROXY (VIT D DEFICIENCY, FRACTURES): Vit D, 25-Hydroxy: 37 ng/mL (ref 30.0–100.0)

## 2023-01-11 LAB — B12 AND FOLATE PANEL
Folate: 19.2 ng/mL (ref >3.0)
Vitamin B-12: 788 pg/mL (ref 232–1245)

## 2023-01-11 LAB — THEOPHYLLINE LEVEL: Theophylline, Serum: 8.4 ug/mL — ABNORMAL LOW (ref 10.0–20.0)

## 2023-01-11 LAB — HGB A1C W/O EAG: Hgb A1c MFr Bld: 6.6 % — ABNORMAL HIGH (ref 4.8–5.6)

## 2023-01-14 DIAGNOSIS — H26491 Other secondary cataract, right eye: Secondary | ICD-10-CM | POA: Diagnosis not present

## 2023-01-14 DIAGNOSIS — E119 Type 2 diabetes mellitus without complications: Secondary | ICD-10-CM | POA: Diagnosis not present

## 2023-01-14 DIAGNOSIS — H5203 Hypermetropia, bilateral: Secondary | ICD-10-CM | POA: Diagnosis not present

## 2023-01-14 DIAGNOSIS — Z961 Presence of intraocular lens: Secondary | ICD-10-CM | POA: Diagnosis not present

## 2023-01-14 DIAGNOSIS — H52223 Regular astigmatism, bilateral: Secondary | ICD-10-CM | POA: Diagnosis not present

## 2023-01-14 DIAGNOSIS — H3554 Dystrophies primarily involving the retinal pigment epithelium: Secondary | ICD-10-CM | POA: Diagnosis not present

## 2023-01-25 ENCOUNTER — Encounter: Payer: Self-pay | Admitting: Nurse Practitioner

## 2023-01-25 DIAGNOSIS — E1169 Type 2 diabetes mellitus with other specified complication: Secondary | ICD-10-CM | POA: Insufficient documentation

## 2023-01-25 DIAGNOSIS — J4540 Moderate persistent asthma, uncomplicated: Secondary | ICD-10-CM | POA: Insufficient documentation

## 2023-01-25 DIAGNOSIS — J449 Chronic obstructive pulmonary disease, unspecified: Secondary | ICD-10-CM | POA: Insufficient documentation

## 2023-01-31 ENCOUNTER — Other Ambulatory Visit: Payer: Self-pay | Admitting: Nurse Practitioner

## 2023-01-31 DIAGNOSIS — Z760 Encounter for issue of repeat prescription: Secondary | ICD-10-CM

## 2023-02-05 ENCOUNTER — Other Ambulatory Visit: Payer: Self-pay | Admitting: Nurse Practitioner

## 2023-02-05 DIAGNOSIS — Z760 Encounter for issue of repeat prescription: Secondary | ICD-10-CM

## 2023-02-11 ENCOUNTER — Other Ambulatory Visit: Payer: Self-pay | Admitting: Nurse Practitioner

## 2023-02-11 DIAGNOSIS — Z760 Encounter for issue of repeat prescription: Secondary | ICD-10-CM

## 2023-02-12 ENCOUNTER — Other Ambulatory Visit: Payer: Self-pay

## 2023-02-12 ENCOUNTER — Emergency Department: Payer: PPO

## 2023-02-12 ENCOUNTER — Inpatient Hospital Stay
Admission: EM | Admit: 2023-02-12 | Discharge: 2023-02-14 | DRG: 310 | Disposition: A | Discharge status: Home / Self Care | Payer: PPO | Attending: Hospitalist | Admitting: Hospitalist

## 2023-02-12 ENCOUNTER — Encounter: Payer: Self-pay | Admitting: Emergency Medicine

## 2023-02-12 DIAGNOSIS — A419 Sepsis, unspecified organism: Secondary | ICD-10-CM | POA: Diagnosis not present

## 2023-02-12 DIAGNOSIS — Z7951 Long term (current) use of inhaled steroids: Secondary | ICD-10-CM

## 2023-02-12 DIAGNOSIS — Z91013 Allergy to seafood: Secondary | ICD-10-CM | POA: Diagnosis not present

## 2023-02-12 DIAGNOSIS — J168 Pneumonia due to other specified infectious organisms: Secondary | ICD-10-CM | POA: Diagnosis not present

## 2023-02-12 DIAGNOSIS — Z1152 Encounter for screening for COVID-19: Secondary | ICD-10-CM | POA: Diagnosis not present

## 2023-02-12 DIAGNOSIS — Z7982 Long term (current) use of aspirin: Secondary | ICD-10-CM | POA: Diagnosis not present

## 2023-02-12 DIAGNOSIS — Z91018 Allergy to other foods: Secondary | ICD-10-CM

## 2023-02-12 DIAGNOSIS — R918 Other nonspecific abnormal finding of lung field: Secondary | ICD-10-CM | POA: Diagnosis not present

## 2023-02-12 DIAGNOSIS — Z9049 Acquired absence of other specified parts of digestive tract: Secondary | ICD-10-CM

## 2023-02-12 DIAGNOSIS — Z833 Family history of diabetes mellitus: Secondary | ICD-10-CM

## 2023-02-12 DIAGNOSIS — Z79899 Other long term (current) drug therapy: Secondary | ICD-10-CM

## 2023-02-12 DIAGNOSIS — I4719 Other supraventricular tachycardia: Principal | ICD-10-CM | POA: Diagnosis present

## 2023-02-12 DIAGNOSIS — Z880 Allergy status to penicillin: Secondary | ICD-10-CM

## 2023-02-12 DIAGNOSIS — K219 Gastro-esophageal reflux disease without esophagitis: Secondary | ICD-10-CM | POA: Diagnosis not present

## 2023-02-12 DIAGNOSIS — J90 Pleural effusion, not elsewhere classified: Secondary | ICD-10-CM | POA: Diagnosis not present

## 2023-02-12 DIAGNOSIS — J189 Pneumonia, unspecified organism: Principal | ICD-10-CM

## 2023-02-12 DIAGNOSIS — Z8249 Family history of ischemic heart disease and other diseases of the circulatory system: Secondary | ICD-10-CM | POA: Diagnosis not present

## 2023-02-12 DIAGNOSIS — Z881 Allergy status to other antibiotic agents status: Secondary | ICD-10-CM

## 2023-02-12 DIAGNOSIS — E876 Hypokalemia: Secondary | ICD-10-CM | POA: Diagnosis not present

## 2023-02-12 DIAGNOSIS — Z86718 Personal history of other venous thrombosis and embolism: Secondary | ICD-10-CM | POA: Diagnosis not present

## 2023-02-12 DIAGNOSIS — E119 Type 2 diabetes mellitus without complications: Secondary | ICD-10-CM | POA: Diagnosis present

## 2023-02-12 DIAGNOSIS — R000 Tachycardia, unspecified: Secondary | ICD-10-CM | POA: Diagnosis not present

## 2023-02-12 DIAGNOSIS — E785 Hyperlipidemia, unspecified: Secondary | ICD-10-CM

## 2023-02-12 DIAGNOSIS — Z90710 Acquired absence of both cervix and uterus: Secondary | ICD-10-CM

## 2023-02-12 DIAGNOSIS — R42 Dizziness and giddiness: Secondary | ICD-10-CM | POA: Diagnosis not present

## 2023-02-12 DIAGNOSIS — I10 Essential (primary) hypertension: Secondary | ICD-10-CM | POA: Diagnosis not present

## 2023-02-12 DIAGNOSIS — J4489 Other specified chronic obstructive pulmonary disease: Secondary | ICD-10-CM

## 2023-02-12 DIAGNOSIS — J302 Other seasonal allergic rhinitis: Secondary | ICD-10-CM | POA: Diagnosis not present

## 2023-02-12 LAB — CBC WITH DIFFERENTIAL/PLATELET
Abs Immature Granulocytes: 0.12 10*3/uL — ABNORMAL HIGH (ref 0.00–0.07)
Basophils Absolute: 0.1 10*3/uL (ref 0.0–0.1)
Basophils Relative: 0 %
Eosinophils Absolute: 0.1 10*3/uL (ref 0.0–0.5)
Eosinophils Relative: 1 %
HCT: 43 % (ref 36.0–46.0)
Hemoglobin: 14.5 g/dL (ref 12.0–15.0)
Immature Granulocytes: 1 %
Lymphocytes Relative: 5 %
Lymphs Abs: 1.1 10*3/uL (ref 0.7–4.0)
MCH: 31.8 pg (ref 26.0–34.0)
MCHC: 33.7 g/dL (ref 30.0–36.0)
MCV: 94.3 fL (ref 80.0–100.0)
Monocytes Absolute: 1.4 10*3/uL — ABNORMAL HIGH (ref 0.1–1.0)
Monocytes Relative: 6 %
Neutro Abs: 19.1 10*3/uL — ABNORMAL HIGH (ref 1.7–7.7)
Neutrophils Relative %: 87 %
Platelets: 281 10*3/uL (ref 150–400)
RBC: 4.56 MIL/uL (ref 3.87–5.11)
RDW: 13.7 % (ref 11.5–15.5)
WBC: 21.9 10*3/uL — ABNORMAL HIGH (ref 4.0–10.5)
nRBC: 0 % (ref 0.0–0.2)

## 2023-02-12 LAB — CBC
HCT: 41.5 % (ref 36.0–46.0)
Hemoglobin: 14.4 g/dL (ref 12.0–15.0)
MCH: 31.4 pg (ref 26.0–34.0)
MCHC: 34.7 g/dL (ref 30.0–36.0)
MCV: 90.6 fL (ref 80.0–100.0)
Platelets: 282 10*3/uL (ref 150–400)
RBC: 4.58 MIL/uL (ref 3.87–5.11)
RDW: 13.4 % (ref 11.5–15.5)
WBC: 21.9 10*3/uL — ABNORMAL HIGH (ref 4.0–10.5)
nRBC: 0 % (ref 0.0–0.2)

## 2023-02-12 LAB — URINALYSIS, W/ REFLEX TO CULTURE (INFECTION SUSPECTED)
Bacteria, UA: NONE SEEN
Bilirubin Urine: NEGATIVE
Glucose, UA: NEGATIVE mg/dL
Hgb urine dipstick: NEGATIVE
Ketones, ur: NEGATIVE mg/dL
Nitrite: NEGATIVE
Protein, ur: NEGATIVE mg/dL
Specific Gravity, Urine: 1.005 (ref 1.005–1.030)
pH: 8 (ref 5.0–8.0)

## 2023-02-12 LAB — BASIC METABOLIC PANEL
Anion gap: 11 (ref 5–15)
BUN: 16 mg/dL (ref 8–23)
CO2: 22 mmol/L (ref 22–32)
Calcium: 8.9 mg/dL (ref 8.9–10.3)
Chloride: 103 mmol/L (ref 98–111)
Creatinine, Ser: 0.59 mg/dL (ref 0.44–1.00)
GFR, Estimated: >60 mL/min (ref >60)
Glucose, Bld: 176 mg/dL — ABNORMAL HIGH (ref 70–99)
Potassium: 3.4 mmol/L — ABNORMAL LOW (ref 3.5–5.1)
Sodium: 136 mmol/L (ref 135–145)

## 2023-02-12 LAB — LACTIC ACID, PLASMA: Lactic Acid, Venous: 1.6 mmol/L (ref 0.5–1.9)

## 2023-02-12 LAB — RESP PANEL BY RT-PCR (RSV, FLU A&B, COVID)  RVPGX2
Influenza A by PCR: NEGATIVE
Influenza B by PCR: NEGATIVE
Resp Syncytial Virus by PCR: NEGATIVE
SARS Coronavirus 2 by RT PCR: NEGATIVE

## 2023-02-12 LAB — HEPATIC FUNCTION PANEL
ALT: 26 U/L (ref 0–44)
AST: 26 U/L (ref 15–41)
Albumin: 3.9 g/dL (ref 3.5–5.0)
Alkaline Phosphatase: 133 U/L — ABNORMAL HIGH (ref 38–126)
Bilirubin, Direct: 0.1 mg/dL (ref 0.0–0.2)
Indirect Bilirubin: 0.7 mg/dL (ref 0.3–0.9)
Total Bilirubin: 0.8 mg/dL (ref <1.2)
Total Protein: 7.3 g/dL (ref 6.5–8.1)

## 2023-02-12 LAB — MAGNESIUM: Magnesium: 1.9 mg/dL (ref 1.7–2.4)

## 2023-02-12 LAB — HIV ANTIBODY (ROUTINE TESTING W REFLEX): HIV Screen 4th Generation wRfx: NONREACTIVE

## 2023-02-12 LAB — PROCALCITONIN: Procalcitonin: <0.1 ng/mL

## 2023-02-12 LAB — TSH: TSH: 2.644 u[IU]/mL (ref 0.350–4.500)

## 2023-02-12 LAB — D-DIMER, QUANTITATIVE: D-Dimer, Quant: 0.67 ug{FEU}/mL — ABNORMAL HIGH (ref 0.00–0.50)

## 2023-02-12 MED ORDER — NITROGLYCERIN 0.4 MG SL SUBL
0.4000 mg | SUBLINGUAL_TABLET | SUBLINGUAL | Status: DC | PRN
Start: 2023-02-12 — End: 2023-02-14

## 2023-02-12 MED ORDER — POTASSIUM CHLORIDE CRYS ER 10 MEQ PO TBCR
10.0000 meq | EXTENDED_RELEASE_TABLET | Freq: Two times a day (BID) | ORAL | Status: DC
  Administered 2023-02-12 – 2023-02-14 (×5): 10 meq via ORAL
  Filled 2023-02-12 (×5): qty 1

## 2023-02-12 MED ORDER — LACTATED RINGERS IV SOLN
150.0000 mL/h | INTRAVENOUS | Status: DC
  Administered 2023-02-12: 150 mL/h via INTRAVENOUS

## 2023-02-12 MED ORDER — ADULT MULTIVITAMIN W/MINERALS CH
1.0000 | ORAL_TABLET | Freq: Every day | ORAL | Status: DC
Start: 2023-02-12 — End: 2023-02-14
  Administered 2023-02-12 – 2023-02-14 (×3): 1 via ORAL
  Filled 2023-02-12 (×3): qty 1

## 2023-02-12 MED ORDER — SODIUM CHLORIDE 0.9 % IV SOLN
500.0000 mg | Freq: Once | INTRAVENOUS | Status: AC
  Administered 2023-02-12: 500 mg via INTRAVENOUS
  Filled 2023-02-12: qty 5

## 2023-02-12 MED ORDER — ACETAMINOPHEN 650 MG RE SUPP: 650.0000 mg | Freq: Four times a day (QID) | RECTAL | Status: DC | PRN

## 2023-02-12 MED ORDER — ACETAMINOPHEN 325 MG PO TABS
650.0000 mg | ORAL_TABLET | Freq: Four times a day (QID) | ORAL | Status: DC | PRN
  Administered 2023-02-12: 650 mg via ORAL
  Filled 2023-02-12: qty 2

## 2023-02-12 MED ORDER — SODIUM CHLORIDE 0.9 % IV SOLN
2.0000 g | Freq: Once | INTRAVENOUS | Status: AC
  Administered 2023-02-12: 2 g via INTRAVENOUS
  Filled 2023-02-12: qty 20

## 2023-02-12 MED ORDER — ONDANSETRON HCL 4 MG/2ML IJ SOLN
4.0000 mg | Freq: Four times a day (QID) | INTRAMUSCULAR | Status: DC | PRN
  Administered 2023-02-13: 4 mg via INTRAVENOUS
  Filled 2023-02-12: qty 2

## 2023-02-12 MED ORDER — SODIUM CHLORIDE 0.9 % IV SOLN: 2.0000 g | INTRAVENOUS | Status: DC

## 2023-02-12 MED ORDER — ATORVASTATIN CALCIUM 20 MG PO TABS
10.0000 mg | ORAL_TABLET | Freq: Every day | ORAL | Status: DC
  Administered 2023-02-12 – 2023-02-14 (×3): 10 mg via ORAL
  Filled 2023-02-12 (×3): qty 1

## 2023-02-12 MED ORDER — LORATADINE 10 MG PO TABS
10.0000 mg | ORAL_TABLET | Freq: Every day | ORAL | Status: DC
  Administered 2023-02-12 – 2023-02-14 (×3): 10 mg via ORAL
  Filled 2023-02-12 (×3): qty 1

## 2023-02-12 MED ORDER — MAGNESIUM HYDROXIDE 400 MG/5ML PO SUSP: 30.0000 mL | Freq: Every day | ORAL | Status: DC | PRN

## 2023-02-12 MED ORDER — ACETAMINOPHEN 500 MG PO TABS
1000.0000 mg | ORAL_TABLET | Freq: Once | ORAL | Status: AC
  Administered 2023-02-12: 1000 mg via ORAL
  Filled 2023-02-12: qty 2

## 2023-02-12 MED ORDER — TRAZODONE HCL 50 MG PO TABS
25.0000 mg | ORAL_TABLET | Freq: Every evening | ORAL | Status: DC | PRN
  Administered 2023-02-12 – 2023-02-13 (×2): 25 mg via ORAL
  Filled 2023-02-12 (×2): qty 1

## 2023-02-12 MED ORDER — ASPIRIN 81 MG PO TBEC
81.0000 mg | DELAYED_RELEASE_TABLET | Freq: Every day | ORAL | Status: DC
  Administered 2023-02-12 – 2023-02-14 (×3): 81 mg via ORAL
  Filled 2023-02-12 (×3): qty 1

## 2023-02-12 MED ORDER — ONDANSETRON HCL 4 MG PO TABS: 4.0000 mg | ORAL_TABLET | Freq: Four times a day (QID) | ORAL | Status: DC | PRN

## 2023-02-12 MED ORDER — FLUTICASONE PROPIONATE 50 MCG/ACT NA SUSP
1.0000 | Freq: Every day | NASAL | Status: DC
  Administered 2023-02-12 – 2023-02-13 (×2): 1 via NASAL
  Filled 2023-02-12: qty 16

## 2023-02-12 MED ORDER — FUROSEMIDE 40 MG PO TABS: 40.0000 mg | ORAL_TABLET | Freq: Two times a day (BID) | ORAL | Status: DC | PRN

## 2023-02-12 MED ORDER — DILTIAZEM HCL ER COATED BEADS 180 MG PO CP24
180.0000 mg | ORAL_CAPSULE | Freq: Two times a day (BID) | ORAL | Status: DC
  Administered 2023-02-12 – 2023-02-14 (×5): 180 mg via ORAL
  Filled 2023-02-12 (×6): qty 1

## 2023-02-12 MED ORDER — GUAIFENESIN ER 600 MG PO TB12
600.0000 mg | ORAL_TABLET | Freq: Two times a day (BID) | ORAL | Status: DC
  Administered 2023-02-12 – 2023-02-14 (×5): 600 mg via ORAL
  Filled 2023-02-12 (×5): qty 1

## 2023-02-12 MED ORDER — IPRATROPIUM-ALBUTEROL 0.5-2.5 (3) MG/3ML IN SOLN
3.0000 mL | Freq: Four times a day (QID) | RESPIRATORY_TRACT | Status: DC
  Administered 2023-02-12 – 2023-02-13 (×5): 3 mL via RESPIRATORY_TRACT
  Filled 2023-02-12 (×5): qty 3

## 2023-02-12 MED ORDER — MONTELUKAST SODIUM 10 MG PO TABS
10.0000 mg | ORAL_TABLET | Freq: Every day | ORAL | Status: DC
  Administered 2023-02-12 – 2023-02-13 (×2): 10 mg via ORAL
  Filled 2023-02-12 (×2): qty 1

## 2023-02-12 MED ORDER — ALPRAZOLAM 0.5 MG PO TABS: 0.5000 mg | ORAL_TABLET | Freq: Two times a day (BID) | ORAL | Status: DC | PRN

## 2023-02-12 MED ORDER — SODIUM CHLORIDE 0.9 % IV SOLN: 500.0000 mg | INTRAVENOUS | Status: DC

## 2023-02-12 MED ORDER — ENOXAPARIN SODIUM 60 MG/0.6ML IJ SOSY
0.5000 mg/kg | PREFILLED_SYRINGE | INTRAMUSCULAR | Status: DC
  Administered 2023-02-12 – 2023-02-13 (×2): 42.5 mg via SUBCUTANEOUS
  Filled 2023-02-12 (×2): qty 0.6

## 2023-02-12 MED ORDER — LACTATED RINGERS IV BOLUS (SEPSIS)
1000.0000 mL | Freq: Once | INTRAVENOUS | Status: AC
Start: 2023-02-12 — End: 2023-02-12
  Administered 2023-02-12: 1000 mL via INTRAVENOUS

## 2023-02-12 MED ORDER — HYDROCOD POLI-CHLORPHE POLI ER 10-8 MG/5ML PO SUER: 5.0000 mL | Freq: Two times a day (BID) | ORAL | Status: DC | PRN

## 2023-02-12 MED ORDER — THEOPHYLLINE ER 400 MG PO TB24
600.0000 mg | ORAL_TABLET | Freq: Every day | ORAL | Status: DC
  Administered 2023-02-12 – 2023-02-14 (×3): 600 mg via ORAL
  Filled 2023-02-12 (×3): qty 1.5

## 2023-02-12 MED ORDER — PANTOPRAZOLE SODIUM 40 MG PO TBEC
40.0000 mg | DELAYED_RELEASE_TABLET | Freq: Two times a day (BID) | ORAL | Status: DC
  Administered 2023-02-12 – 2023-02-14 (×5): 40 mg via ORAL
  Filled 2023-02-12 (×5): qty 1

## 2023-02-12 NOTE — Assessment & Plan Note (Signed)
-   We will continue PPI therapy 

## 2023-02-12 NOTE — ED Triage Notes (Signed)
Pt st since yesterday afternoon not feeling well, having tachycardia, dizziness and nausea; denies any CP

## 2023-02-12 NOTE — Progress Notes (Signed)
Anticoagulation monitoring(Lovenox):  62 yo female ordered Lovenox 40 mg Q24h    Filed Weights   02/12/23 0138  Weight: 85.7 kg (189 lb)   BMI 33.5    Lab Results  Component Value Date   CREATININE 0.59 02/12/2023   CREATININE 0.69 01/10/2023   CREATININE 0.68 12/28/2021   Estimated Creatinine Clearance: 73.7 mL/min (by C-G formula based on SCr of 0.59 mg/dL). Hemoglobin & Hematocrit     Component Value Date/Time   HGB 14.4 02/12/2023 0126   HGB 13.9 01/10/2023 0845   HCT 41.5 02/12/2023 0126   HCT 42.7 01/10/2023 0845     Per Protocol for Patient with estCrcl > 30 ml/min and BMI > 30, will transition to Lovenox 42.5 mg Q24h.

## 2023-02-12 NOTE — Assessment & Plan Note (Signed)
-   We will continue statin therapy. 

## 2023-02-12 NOTE — ED Notes (Addendum)
Family at bedside, pt ambulated to bathroom with steady gait x1 void.

## 2023-02-12 NOTE — ED Notes (Signed)
Previous nurse unable to obtain second set of blood cultures, and IV antibiotics have been given.

## 2023-02-12 NOTE — ED Notes (Signed)
Pt given crackers and diet sprite, husband at bedside

## 2023-02-12 NOTE — Assessment & Plan Note (Signed)
-   We will continue anti-hypertensive therapy. 

## 2023-02-12 NOTE — H&P (Signed)
[place]   PATIENT NAME: Cassie Ross    MR#:  [id number]  DATE OF BIRTH:  [birth year]  DATE OF ADMISSION:  02/12/2023  PRIMARY CARE PHYSICIAN: Sallyanne Kuster, NP   Patient is coming from: Home  REQUESTING/REFERRING PHYSICIAN: Ward, Layla Maw, DO  CHIEF COMPLAINT:   Chief Complaint  Patient presents with  . Tachycardia    HISTORY OF PRESENT ILLNESS:  ALLEN COHENOUR is a 62 y.o. female with medical history significant for CHF, asthma, COPD, GERD, hypertension, dyslipidemia, and type 2 diabetes mellitus who presented to the emergency room with acute onset of malaise and palpitations.  The patient has been having headache and chills with associated cough and vomiting.  No diarrhea or melena or bright red bleeding per rectum.  He denied any dysuria, oliguria or hematuria or flank pain.  No chest pain or palpitations no dyspnea or hemoptysis.  When the patient came to the ER  ED Course: Upon presentation to the emergency room, BP was 158/95 with respiratory rate of 21 and heart rate of 132.  Labs revealed mild hypokalemia of 3.4 and blood glucose of 176.  Alk phos was 133 with otherwise unremarkable CMP.  CBC showed leukocytosis 21.9.  UA with suspicious for UTI with 11-20 WBCs and moderate leukocytes.  Blood cultures and urine culture was sent..  EKG as reviewed by me : EKG showed sinus tachycardia with left axis deviation, low voltage QRS and Q waves in V1 through V4 Imaging: Portable chest x-ray revealed mild left basilar atelectasis and/or infiltrate with small left pleural effusion.  The patient was given IV Rocephin and Zithromax, 1 g of p.o. Tylenol and 1 L bolus of IV lactated Ringer.  She will be admitted to a medical telemetry bed for further evaluation and management. PAST MEDICAL HISTORY:   Past Medical History:  Diagnosis Date  . Anemia   . Asthma   . CHF (congestive heart failure) (HCC)    1991- unknown after asthma attack  . Chicken pox   .  Complication of anesthesia 1998   woke up during procedure   . COPD (chronic obstructive pulmonary disease) (HCC)   . Diabetes (HCC)    Type II   . 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 DVT (deep venous thrombosis) (HCC)   . GERD (gastroesophageal reflux disease)   . Hernia, hiatal 2007  . HLD (hyperlipidemia)   . Hypertension   . Hypertension   . Pneumonia   . Seasonal allergies     PAST SURGICAL HISTORY:   Past Surgical History:  Procedure Laterality Date  . BLADDER SUSPENSION N/A 05/23/2020   Procedure: TRANSVAGINAL TAPE (TVT) PROCEDURE;  Surgeon: Nadara Mustard, MD;  Location: [facility] ORS;  Service: Gynecology;  Laterality: N/A;  . cataract surgery    . CHOLECYSTECTOMY    . COLONOSCOPY WITH PROPOFOL N/A 01/06/2018   Procedure: COLONOSCOPY WITH PROPOFOL;  Surgeon: Christena Deem, MD;  Location: [facility] ENDOSCOPY;  Service: Endoscopy;  Laterality: N/A;  . CYSTOCELE REPAIR N/A 05/23/2020   Procedure: ANTERIOR REPAIR (CYSTOCELE);  Surgeon: Nadara Mustard, MD;  Location: [facility] ORS;  Service: Gynecology;  Laterality: N/A;  . CYSTOSCOPY N/A 05/23/2020   Procedure: CYSTOSCOPY;  Surgeon: Nadara Mustard, MD;  Location: [facility] ORS;  Service: Gynecology;  Laterality: N/A;  . ESOPHAGEAL DILATION    . ESOPHAGOGASTRODUODENOSCOPY (EGD) WITH PROPOFOL N/A 01/06/2018   Procedure: ESOPHAGOGASTRODUODENOSCOPY (EGD) WITH PROPOFOL;  Surgeon: Barnetta Chapel  U, MD;  Location: [facility] ENDOSCOPY;  Service: Endoscopy;  Laterality: N/A;  . EYE SURGERY Bilateral 2014   cataract   . HERNIA REPAIR    . hiatial hernia repair    . NASAL SINUS SURGERY    . PUBOVAGINAL SLING N/A 05/23/2020   Procedure: Leonides Grills;  Surgeon: Nadara Mustard, MD;  Location: [facility] ORS;  Service: Gynecology;  Laterality: N/A;  . RECTOCELE REPAIR  05/23/2020   Procedure: POSTERIOR REPAIR (RECTOCELE);  Surgeon: Nadara Mustard, MD;  Location: [facility] ORS;  Service: Gynecology;;  . VAGINAL HYSTERECTOMY  Bilateral 05/23/2020   Procedure: HYSTERECTOMY VAGINAL BILATERIAL SALPINGO OOPHORECTOMY;  Surgeon: Nadara Mustard, MD;  Location: [facility] ORS;  Service: Gynecology;  Laterality: Bilateral;  LEFT fallopian tube, BILAT ovaries    SOCIAL HISTORY:   Social History   Tobacco Use  . Smoking status: Never  . Smokeless tobacco: Never  Substance Use Topics  . Alcohol use: Yes    Comment: glass of wine rarely    FAMILY HISTORY:   Family History  Problem Relation Age of Onset  . Hypertension Mother   . Lung cancer Mother   . Colon cancer Father   . Colon polyps Sister   . Diabetes Sister     DRUG ALLERGIES:   Allergies  Allergen Reactions  . Shellfish Allergy Swelling  . Sunflower Oil Swelling    seeds  . Levaquin [Levofloxacin In D5w] Rash    Pill form  . Levaquin [Levofloxacin] Rash    Allergy to pill form only. Patient has had IV with no problem.  Marland Kitchen Penicillins Rash    Has patient had a PCN reaction causing immediate rash, facial/tongue/throat swelling, SOB or lightheadedness with hypotension: no Has patient had a PCN reaction causing severe rash involving mucus membranes or skin necrosis: no Has patient had a PCN reaction that required hospitalization no Has patient had a PCN reaction occurring within the last 10 years: no If all of the above answers are "NO", then may proceed with Cephalosporin use.     REVIEW OF SYSTEMS:   ROS As per history of present illness. All pertinent systems were reviewed above. Constitutional, HEENT, cardiovascular, respiratory, GI, GU, musculoskeletal, neuro, psychiatric, endocrine, integumentary and hematologic systems were reviewed and are otherwise negative/unremarkable except for positive findings mentioned above in the HPI.   MEDICATIONS AT HOME:   Prior to Admission medications   Medication Sig Start Date End Date Taking? Authorizing Provider  acetaminophen (TYLENOL) 500 MG tablet Take 1,000 mg by mouth every 8 (eight) hours as needed  for moderate pain.     [provider]  albuterol (PROVENTIL) (2.5 MG/3ML) 0.083% nebulizer solution Take 3 mLs (2.5 mg total) by nebulization every 6 (six) hours as needed for shortness of breath. USE 1 VIAL IN NEBULIZER EVERY 6 HOURS AS NEEDED 07/14/20   Lyndon Code, MD  albuterol (VENTOLIN HFA) 108 (90 Base) MCG/ACT inhaler INHALE 2 PUFFS BY MOUTH EVERY 4 HOURS AS NEEDED FOR WHEEZING FOR SHORTNESS OF BREATH 02/02/23   Lyndon Code, MD  ALPRAZolam Prudy Feeler) 0.5 MG tablet Take 1 tablet (0.5 mg total) by mouth 2 (two) times daily as needed. for anxiety. May take 1 extra tab for severe anxiety/panic daily as needed 12/18/22   Sallyanne Kuster, NP  aspirin EC 81 MG tablet Take 81 mg by mouth daily.    [provider]  atorvastatin (LIPITOR) 10 MG tablet Take 10 mg by mouth daily. 12/03/21   [provider]  Blood Glucose Monitoring Suppl [facility] VERIO) w/Device KIT Use as directed diag 04/26/20   Lyndon Code, MD  Budeson-Glycopyrrol-Formoterol (BREZTRI AEROSPHERE) 160-9-4.8 MCG/ACT AERO INHALE 2 PUFFS TWICE DAILY 11/18/22   Sallyanne Kuster, NP  carboxymethylcellul-glycerin (LUBRICANT DROPS/DUAL-ACTION) 0.5-0.9 % ophthalmic solution Place 1-2 drops into both eyes 3 (three) times daily as needed for dry eyes.    [provider]  Clobetasol Prop Emollient Base (CLOBETASOL PROPIONATE E) 0.05 % emollient cream Apply topically 2 (two) times daily.    [provider]  diltiazem (CARDIZEM CD) 180 MG 24 hr capsule Take 1 capsule (180 mg total) by mouth 2 (two) times daily. 12/30/22   Sallyanne Kuster, NP  estradiol (ESTRACE) 0.1 MG/GM vaginal cream Apply a pea-sized amount to fingertip and apply to the urethral meatus twice weekly 10/06/21   Stoioff, Verna Czech, MD  fluticasone (FLONASE) 50 MCG/ACT nasal spray Place 1 spray into both nostrils in the morning and at bedtime.    [provider]  furosemide (LASIX) 40 MG tablet Take 1 tablet (40 mg total) by mouth 2  (two) times daily as needed (fluid retention.). 10/12/20   Abernathy, Arlyss Repress, NP  glucose blood (ONETOUCH VERIO) test strip USE 1 STRIP TO CHECK GLUCOSE 4 TIMES DAILY AS DIRECTED 07/17/22   Abernathy, Arlyss Repress, NP  ipratropium-albuterol (DUONEB) 0.5-2.5 (3) MG/3ML SOLN Inhale 3 mLs into the lungs every 4 (four) hours as needed (wheezing/shortness of breath). J44.0 08/23/20   Lyndon Code, MD  LEADER UNIFINE PENTIPS 32G X 4 MM MISC USE AS DIRECTED WITH INSULIN 09/05/20   Lyndon Code, MD  loratadine (CLARITIN) 10 MG tablet Take 10 mg by mouth daily.    [provider]  montelukast (SINGULAIR) 10 MG tablet TAKE 1 TABLET BY MOUTH AT BEDTIME 11/05/22   Sallyanne Kuster, NP  Multiple Vitamin (MULTIVITAMIN WITH MINERALS) TABS tablet Take 1 tablet by mouth daily.    [provider]  nitroGLYCERIN (NITROSTAT) 0.4 MG SL tablet Place 1 tablet (0.4 mg total) under the tongue every 5 (five) minutes as needed for chest pain. X3 tablets then call 911 if no relief. 02/26/22   Sallyanne Kuster, NP  pantoprazole (PROTONIX) 40 MG tablet Take 1 tablet (40 mg total) by mouth 2 (two) times daily. 11/27/21   Sallyanne Kuster, NP  potassium chloride (KLOR-CON M) 10 MEQ tablet Take 1 tablet by mouth twice daily 11/14/21   Sallyanne Kuster, NP  theophylline (UNIPHYL) 600 MG 24 hr tablet TAKE ONE TABLET BY MOUTH ONE TIME DAILY 02/11/23   Lyndon Code, MD      VITAL SIGNS:  Blood pressure (!) 116/50, pulse 80, temperature 99 F (37.2 C), temperature source Rectal, resp. rate 16, height 5\' 3"  (1.6 m), weight 85.7 kg, SpO2 97%.  PHYSICAL EXAMINATION:  Physical Exam  GENERAL:  62 y.o.-year-old female patient lying in the bed with no acute distress.  EYES: Pupils equal, round, reactive to light and accommodation. No scleral icterus. Extraocular muscles intact.  HEENT: Head atraumatic, normocephalic. Oropharynx and nasopharynx clear.  NECK:  Supple, no jugular venous distention. No thyroid enlargement, no  tenderness.  LUNGS: Diminished left base of breath sounds with left basal crackles.  No use of accessory muscles of respiration.  CARDIOVASCULAR: Regular rate and rhythm, S1, S2 normal. No murmurs, rubs, or gallops.  ABDOMEN: Soft, nondistended, nontender. Bowel sounds present. No organomegaly or mass.  EXTREMITIES: No pedal edema, cyanosis, or clubbing.  NEUROLOGIC: Cranial nerves II through XII are intact. Muscle strength 5/5 in  all extremities. Sensation intact. Gait not checked.  PSYCHIATRIC: The patient is alert and oriented x 3.  Normal affect and good eye contact. SKIN: No obvious rash, lesion, or ulcer.   LABORATORY PANEL:   CBC Recent Labs  Lab 02/12/23 0126  WBC 21.9*  HGB 14.4  HCT 41.5  PLT 282   ------------------------------------------------------------------------------------------------------------------  Chemistries  Recent Labs  Lab 02/12/23 0126 02/12/23 0200  NA 136  --   K 3.4*  --   CL 103  --   CO2 22  --   GLUCOSE 176*  --   BUN 16  --   CREATININE 0.59  --   CALCIUM 8.9  --   MG  --  1.9  AST  --  26  ALT  --  26  ALKPHOS  --  133*  BILITOT  --  0.8   ------------------------------------------------------------------------------------------------------------------  Cardiac Enzymes No results for input(s): "TROPONINI" in the last 168 hours. ------------------------------------------------------------------------------------------------------------------  RADIOLOGY:  DG Chest Port 1 View  Result Date: 02/12/2023 CLINICAL DATA:  Tachycardia with dizziness and nausea. EXAM: PORTABLE CHEST 1 VIEW COMPARISON:  December 02, 2019 FINDINGS: The heart size and mediastinal contours are within normal limits. Mild atelectasis and/or infiltrate is seen within the left lung base. A small left pleural effusion is noted. No pneumothorax is identified. The visualized skeletal structures are unremarkable. IMPRESSION: 1. Mild left basilar atelectasis and/or  infiltrate. 2. Small left pleural effusion. Electronically Signed   By: Aram Candela M.D.   On: 02/12/2023 03:09      IMPRESSION AND PLAN:  Assessment and Plan: * Sepsis due to pneumonia [facility]) - The patient will be admitted to a medical telemetry bed. - Will continue antibiotic therapy with IV Rocephin and Zithromax. - Mucolytic therapy be provided as well as duo nebs q.i.d. and q.4 hours p.r.n. - We will follow blood cultures. -The patient will be hydrated with IV lactated ringer.  Asthma with COPD (HCC) - The patient will be placed on scheduled and as needed DuoNebs. - We will continue her theophylline and Singulair. - We will hold off long-acting beta agonist.  Dyslipidemia - We will continue statin therapy.  GERD without esophagitis - We will continue PPI therapy.  Essential hypertension - We will continue antihypertensive therapy.   DVT prophylaxis: Lovenox.  Advanced Care Planning:  Code Status: full code.  Family Communication:  The plan of care was discussed in details with the patient (and family). I answered all questions. The patient agreed to proceed with the above mentioned plan. Further management will depend upon hospital course. Disposition Plan: Back to previous home environment Consults called: none.  All the records are reviewed and case discussed with ED provider.  Status is: Inpatient  At the time of the admission, it appears that the appropriate admission status for this patient is inpatient.  This is judged to be reasonable and necessary in order to provide the required intensity of service to ensure the patient's safety given the presenting symptoms, physical exam findings and initial radiographic and laboratory data in the context of comorbid conditions.  The patient requires inpatient status due to high intensity of service, high risk of further deterioration and high frequency of surveillance required.  I certify that at the time of admission,  it is my clinical judgment that the patient will require inpatient hospital care extending more than 2 midnights.  Dispo: The patient is from: Home              Anticipated d/c is to: Home              Patient currently is not medically stable to d/c.              Difficult to place patient: No  Hannah Beat M.D on 02/12/2023 at 5:57 AM  [facility]   From 7 PM-7 AM, contact night-coverage [email or web address]  CC: Primary care physician; Sallyanne Kuster, NP

## 2023-02-12 NOTE — ED Provider Notes (Signed)
[facility] Provider Note    Event Date/Time   First MD Initiated Contact with Patient 02/12/23 [phone]     (approximate)   History   Tachycardia   HPI  Cassie Ross is a 62 y.o. female with history of hypertension, diabetes, hyperlipidemia, COPD, DVT who presents to the emergency department complaints of not feeling well today.  Reports she has had headache, chills, cough, vomiting.  No diarrhea, dysuria or hematuria.  No chest pain or shortness of breath.  States that she felt like her heart was racing tonight which is what prompted her to come to the ER.   History provided by patient, husband.    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    DVT (deep venous thrombosis) (HCC)    GERD (gastroesophageal reflux disease)    Hernia, hiatal 2007   HLD (hyperlipidemia)    Hypertension    Hypertension    Pneumonia    Seasonal allergies     Past Surgical History:  Procedure Laterality Date   BLADDER SUSPENSION N/A 05/23/2020   Procedure: TRANSVAGINAL TAPE (TVT) PROCEDURE;  Surgeon: Nadara Mustard, MD;  Location: [facility] ORS;  Service: Gynecology;  Laterality: N/A;   cataract surgery     CHOLECYSTECTOMY     COLONOSCOPY WITH PROPOFOL N/A 01/06/2018   Procedure: COLONOSCOPY WITH PROPOFOL;  Surgeon: Christena Deem, MD;  Location: [facility] ENDOSCOPY;  Service: Endoscopy;  Laterality: N/A;   CYSTOCELE REPAIR N/A 05/23/2020   Procedure: ANTERIOR REPAIR (CYSTOCELE);  Surgeon: Nadara Mustard, MD;  Location: [facility] ORS;  Service: Gynecology;  Laterality: N/A;   CYSTOSCOPY N/A 05/23/2020   Procedure: CYSTOSCOPY;  Surgeon: Nadara Mustard, MD;   Location: [facility] ORS;  Service: Gynecology;  Laterality: N/A;   ESOPHAGEAL DILATION     ESOPHAGOGASTRODUODENOSCOPY (EGD) WITH PROPOFOL N/A 01/06/2018   Procedure: ESOPHAGOGASTRODUODENOSCOPY (EGD) WITH PROPOFOL;  Surgeon: Christena Deem, MD;  Location: [facility] ENDOSCOPY;  Service: Endoscopy;  Laterality: N/A;   EYE SURGERY Bilateral 2014   cataract    HERNIA REPAIR     hiatial hernia repair     NASAL SINUS SURGERY     PUBOVAGINAL SLING N/A 05/23/2020   Procedure: Leonides Grills;  Surgeon: Nadara Mustard, MD;  Location: [facility] ORS;  Service: Gynecology;  Laterality: N/A;   RECTOCELE REPAIR  05/23/2020   Procedure: POSTERIOR REPAIR (RECTOCELE);  Surgeon: Nadara Mustard, MD;  Location: [facility] ORS;  Service: Gynecology;;   VAGINAL HYSTERECTOMY Bilateral 05/23/2020   Procedure: HYSTERECTOMY VAGINAL BILATERIAL SALPINGO OOPHORECTOMY;  Surgeon: Nadara Mustard, MD;  Location: [facility] ORS;  Service: Gynecology;  Laterality: Bilateral;  LEFT fallopian tube, BILAT ovaries    MEDICATIONS:  Prior to Admission medications   Medication Sig Start Date End Date Taking? Authorizing Provider  acetaminophen (TYLENOL) 500 MG tablet Take 1,000 mg by mouth every 8 (eight) hours as needed for moderate pain.     [provider]  albuterol (PROVENTIL) (2.5 MG/3ML) 0.083% nebulizer solution Take 3 mLs (2.5 mg total) by nebulization every 6 (six) hours as needed for shortness  of breath. USE 1 VIAL IN NEBULIZER EVERY 6 HOURS AS NEEDED 07/14/20   Lyndon Code, MD  albuterol (VENTOLIN HFA) 108 (90 Base) MCG/ACT inhaler INHALE 2 PUFFS BY MOUTH EVERY 4 HOURS AS NEEDED FOR WHEEZING FOR SHORTNESS OF BREATH 02/02/23   Lyndon Code, MD  ALPRAZolam Prudy Feeler) 0.5 MG tablet Take 1 tablet (0.5 mg total) by mouth 2 (two) times daily as needed. for anxiety. May take 1 extra tab for severe anxiety/panic daily as needed 12/18/22   Sallyanne Kuster, NP  aspirin EC 81 MG tablet Take 81 mg by mouth daily.    [provider]   atorvastatin (LIPITOR) 10 MG tablet Take 10 mg by mouth daily. 12/03/21   [provider]  Blood Glucose Monitoring Suppl [facility] VERIO) w/Device KIT Use as directed diag 04/26/20   Lyndon Code, MD  Budeson-Glycopyrrol-Formoterol (BREZTRI AEROSPHERE) 160-9-4.8 MCG/ACT AERO INHALE 2 PUFFS TWICE DAILY 11/18/22   Sallyanne Kuster, NP  carboxymethylcellul-glycerin (LUBRICANT DROPS/DUAL-ACTION) 0.5-0.9 % ophthalmic solution Place 1-2 drops into both eyes 3 (three) times daily as needed for dry eyes.    [provider]  Clobetasol Prop Emollient Base (CLOBETASOL PROPIONATE E) 0.05 % emollient cream Apply topically 2 (two) times daily.    [provider]  diltiazem (CARDIZEM CD) 180 MG 24 hr capsule Take 1 capsule (180 mg total) by mouth 2 (two) times daily. 12/30/22   Sallyanne Kuster, NP  estradiol (ESTRACE) 0.1 MG/GM vaginal cream Apply a pea-sized amount to fingertip and apply to the urethral meatus twice weekly 10/06/21   Stoioff, Verna Czech, MD  fluticasone (FLONASE) 50 MCG/ACT nasal spray Place 1 spray into both nostrils in the morning and at bedtime.    [provider]  furosemide (LASIX) 40 MG tablet Take 1 tablet (40 mg total) by mouth 2 (two) times daily as needed (fluid retention.). 10/12/20   Abernathy, Arlyss Repress, NP  glucose blood (ONETOUCH VERIO) test strip USE 1 STRIP TO CHECK GLUCOSE 4 TIMES DAILY AS DIRECTED 07/17/22   Abernathy, Arlyss Repress, NP  ipratropium-albuterol (DUONEB) 0.5-2.5 (3) MG/3ML SOLN Inhale 3 mLs into the lungs every 4 (four) hours as needed (wheezing/shortness of breath). J44.0 08/23/20   Lyndon Code, MD  LEADER UNIFINE PENTIPS 32G X 4 MM MISC USE AS DIRECTED WITH INSULIN 09/05/20   Lyndon Code, MD  loratadine (CLARITIN) 10 MG tablet Take 10 mg by mouth daily.    [provider]  montelukast (SINGULAIR) 10 MG tablet TAKE 1 TABLET BY MOUTH AT BEDTIME 11/05/22   Sallyanne Kuster, NP  Multiple Vitamin (MULTIVITAMIN WITH MINERALS) TABS tablet  Take 1 tablet by mouth daily.    [provider]  nitroGLYCERIN (NITROSTAT) 0.4 MG SL tablet Place 1 tablet (0.4 mg total) under the tongue every 5 (five) minutes as needed for chest pain. X3 tablets then call 911 if no relief. 02/26/22   Sallyanne Kuster, NP  pantoprazole (PROTONIX) 40 MG tablet Take 1 tablet (40 mg total) by mouth 2 (two) times daily. 11/27/21   Sallyanne Kuster, NP  potassium chloride (KLOR-CON M) 10 MEQ tablet Take 1 tablet by mouth twice daily 11/14/21   Sallyanne Kuster, NP  theophylline (UNIPHYL) 600 MG 24 hr tablet TAKE ONE TABLET BY MOUTH ONE TIME DAILY 02/11/23   Lyndon Code, MD    Physical Exam   Triage Vital Signs: ED Triage Vitals  Encounter Vitals Group     BP 02/12/23 0122 (!) 158/95     Systolic BP Percentile --  Diastolic BP Percentile --      Pulse Rate 02/12/23 0122 (!) 132     Resp 02/12/23 0122 (!) 21     Temp 02/12/23 0122 99.3 F (37.4 C)     Temp Source 02/12/23 0122 Oral     SpO2 --      Weight 02/12/23 0138 189 lb (85.7 kg)     Height 02/12/23 0138 5\' 3"  (1.6 m)     Head Circumference --      Peak Flow --      Pain Score 02/12/23 0138 0     Pain Loc --      Pain Education --      Exclude from Growth Chart --     Most recent vital signs: Vitals:   02/12/23 0315 02/12/23 0443  BP: 91/68   Pulse: 99 86  Resp: 20   Temp:    SpO2: 98%     CONSTITUTIONAL: Alert, responds appropriately to questions. Well-appearing; well-nourished HEAD: Normocephalic, atraumatic EYES: Conjunctivae clear, pupils appear equal, sclera nonicteric ENT: normal nose; moist mucous membranes NECK: Supple, normal ROM CARD: Regular and tachycardic; S1 and S2 appreciated RESP: Normal chest excursion without splinting or tachypnea; breath sounds clear and equal bilaterally; no wheezes, no rhonchi, no rales, no hypoxia or respiratory distress, speaking full sentences ABD/GI: Non-distended; soft, non-tender, no rebound, no guarding, no peritoneal  signs BACK: The back appears normal EXT: Normal ROM in all joints; no deformity noted, no edema SKIN: Normal color for age and race; warm; no rash on exposed skin NEURO: Moves all extremities equally, normal speech PSYCH: The patient's mood and manner are appropriate.   ED Results / Procedures / Treatments   LABS: (all labs ordered are listed, but only abnormal results are displayed) Labs Reviewed  BASIC METABOLIC PANEL - Abnormal; Notable for the following components:      Result Value   Potassium 3.4 (*)    Glucose, Bld 176 (*)    All other components within normal limits  URINALYSIS, W/ REFLEX TO CULTURE (INFECTION SUSPECTED) - Abnormal; Notable for the following components:   Color, Urine STRAW (*)    APPearance CLEAR (*)    Leukocytes,Ua MODERATE (*)    All other components within normal limits  HEPATIC FUNCTION PANEL - Abnormal; Notable for the following components:   Alkaline Phosphatase 133 (*)    All other components within normal limits  CBC - Abnormal; Notable for the following components:   WBC 21.9 (*)    All other components within normal limits  RESP PANEL BY RT-PCR (RSV, FLU A&B, COVID)  RVPGX2  CULTURE, BLOOD (ROUTINE X 2)  CULTURE, BLOOD (ROUTINE X 2)  URINE CULTURE  LACTIC ACID, PLASMA  MAGNESIUM  TSH  PROCALCITONIN  HIV ANTIBODY (ROUTINE TESTING W REFLEX)     EKG:  EKG Interpretation Date/Time:  Wednesday February 12 2023 01:24:04 EST Ventricular Rate:  127 PR Interval:  160 QRS Duration:  96 QT Interval:  304 QTC Calculation: 441 R Axis:   -32  Text Interpretation: Sinus tachycardia Left axis deviation Low voltage QRS Possible Anterolateral infarct (cited on or before 02-Dec-2019) Abnormal ECG When compared with ECG of 02-Dec-2019 13:08, QRS duration has increased Questionable change in initial forces of Lateral leads Confirmed by Rochele Raring [phone]) on 02/12/2023 1:59:23 AM         RADIOLOGY: My personal review and interpretation of  imaging: Chest x-ray shows pneumonia.  I have personally reviewed all radiology reports.   DG  Chest Port 1 View  Result Date: 02/12/2023 CLINICAL DATA:  Tachycardia with dizziness and nausea. EXAM: PORTABLE CHEST 1 VIEW COMPARISON:  December 02, 2019 FINDINGS: The heart size and mediastinal contours are within normal limits. Mild atelectasis and/or infiltrate is seen within the left lung base. A small left pleural effusion is noted. No pneumothorax is identified. The visualized skeletal structures are unremarkable. IMPRESSION: 1. Mild left basilar atelectasis and/or infiltrate. 2. Small left pleural effusion. Electronically Signed   By: Aram Candela M.D.   On: 02/12/2023 03:09     PROCEDURES:  Critical Care performed: Yes, see critical care procedure note(s)   CRITICAL CARE Performed by: Baxter Hire Annebelle Bostic   Total critical care time: 30 minutes  Critical care time was exclusive of separately billable procedures and treating other patients.  Critical care was necessary to treat or prevent imminent or life-threatening deterioration.  Critical care was time spent personally by me on the following activities: development of treatment plan with patient and/or surrogate as well as nursing, discussions with consultants, evaluation of patient's response to treatment, examination of patient, obtaining history from patient or surrogate, ordering and performing treatments and interventions, ordering and review of laboratory studies, ordering and review of radiographic studies, pulse oximetry and re-evaluation of patient's condition.   Marland Kitchen1-3 Lead EKG Interpretation  Performed by: Warren Kugelman, Layla Maw, DO Authorized by: Travious Vanover, Layla Maw, DO     Interpretation: abnormal     ECG rate:  132   ECG rate assessment: tachycardic     Rhythm: sinus tachycardia     Ectopy: none     Conduction: normal       IMPRESSION / MDM / ASSESSMENT AND PLAN / ED COURSE  I reviewed the triage vital signs and the  nursing notes.    Patient here with temperature of 99.2, tachycardia, tachypnea.  Concern for possible sepsis.  The patient is on the cardiac monitor to evaluate for evidence of arrhythmia and/or significant heart rate changes.   DIFFERENTIAL DIAGNOSIS (includes but not limited to):   Pneumonia, UTI, bacteremia, sepsis, electrolyte derangement, anemia, thyroid dysfunction, dehydration   Patient's presentation is most consistent with acute presentation with potential threat to life or bodily function.   PLAN: Will obtain labs, cultures, chest x-ray, urine.  Will give Tylenol, IV fluids and reassess.   MEDICATIONS GIVEN IN ED: Medications  aspirin EC tablet 81 mg (has no administration in time range)  atorvastatin (LIPITOR) tablet 10 mg (has no administration in time range)  diltiazem (CARDIZEM CD) 24 hr capsule 180 mg (has no administration in time range)  furosemide (LASIX) tablet 40 mg (has no administration in time range)  nitroGLYCERIN (NITROSTAT) SL tablet 0.4 mg (has no administration in time range)  ALPRAZolam (XANAX) tablet 0.5 mg (has no administration in time range)  pantoprazole (PROTONIX) EC tablet 40 mg (has no administration in time range)  multivitamin with minerals tablet 1 tablet (has no administration in time range)  potassium chloride SA (KLOR-CON M) CR tablet 10 mEq (has no administration in time range)  fluticasone (FLONASE) 50 MCG/ACT nasal spray 1 spray (has no administration in time range)  loratadine (CLARITIN) tablet 10 mg (has no administration in time range)  montelukast (SINGULAIR) tablet 10 mg (has no administration in time range)  theophylline (UNIPHYL) 400 MG 24 hr tablet 600 mg (has no administration in time range)  lactated ringers infusion (has no administration in time range)  enoxaparin (LOVENOX) injection 40 mg (has no administration in time range)  cefTRIAXone (  ROCEPHIN) 2 g in sodium chloride 0.9 % 100 mL IVPB (has no administration in time  range)  azithromycin (ZITHROMAX) 500 mg in sodium chloride 0.9 % 250 mL IVPB (has no administration in time range)  acetaminophen (TYLENOL) tablet 650 mg (has no administration in time range)    Or  acetaminophen (TYLENOL) suppository 650 mg (has no administration in time range)  traZODone (DESYREL) tablet 25 mg (has no administration in time range)  magnesium hydroxide (MILK OF MAGNESIA) suspension 30 mL (has no administration in time range)  ondansetron (ZOFRAN) tablet 4 mg (has no administration in time range)    Or  ondansetron (ZOFRAN) injection 4 mg (has no administration in time range)  guaiFENesin (MUCINEX) 12 hr tablet 600 mg (has no administration in time range)  chlorpheniramine-HYDROcodone (TUSSIONEX) 10-8 MG/5ML suspension 5 mL (has no administration in time range)  ipratropium-albuterol (DUONEB) 0.5-2.5 (3) MG/3ML nebulizer solution 3 mL (has no administration in time range)  lactated ringers bolus 1,000 mL (0 mLs Intravenous Stopped 02/12/23 0257)  acetaminophen (TYLENOL) tablet 1,000 mg (1,000 mg Oral Given 02/12/23 0317)  cefTRIAXone (ROCEPHIN) 2 g in sodium chloride 0.9 % 100 mL IVPB (0 g Intravenous Stopped 02/12/23 0423)  azithromycin (ZITHROMAX) 500 mg in sodium chloride 0.9 % 250 mL IVPB (0 mg Intravenous Stopped 02/12/23 0434)     ED COURSE: Labs show leukocytosis of 21,000.  Normal lactic.  COVID, flu and RSV negative.  Urine shows moderate leukocytes, 11-20,000 white blood cells but no bacteria or other sign of infection.  Chest x-ray reviewed and interpreted by myself and the radiologist and is concerning for left lower lobe infiltrate.  Patient getting Rocephin and azithromycin here.  HR improved to the 80s.  No hypoxia.  Will admit for sepsis.   CONSULTS:  Consulted and discussed patient's case with hospitalist, Dr. Arville Care.  I have recommended admission and consulting physician agrees and will place admission orders.  Patient (and family if present) agree with this plan.    I reviewed all nursing notes, vitals, pertinent previous records.  All labs, EKGs, imaging ordered have been independently reviewed and interpreted by myself.     OUTSIDE RECORDS REVIEWED: Reviewed last pulmonology note in September 2024.       FINAL CLINICAL IMPRESSION(S) / ED DIAGNOSES   Final diagnoses:  Pneumonia of left lower lobe due to infectious organism  Acute sepsis (HCC)     Rx / DC Orders   ED Discharge Orders     None        Note:  This document was prepared using Dragon voice recognition software and may include unintentional dictation errors.   Jule Schlabach, Layla Maw, DO 02/12/23 [phone]

## 2023-02-12 NOTE — ED Notes (Signed)
Rainbow sent to the lab at this time.  

## 2023-02-12 NOTE — ED Notes (Signed)
Patient removed from monitor, and ambulatory to bathroom.

## 2023-02-12 NOTE — ED Notes (Signed)
Pt asking what plan is for care, RN messaged hospitalist for further detail.

## 2023-02-12 NOTE — Assessment & Plan Note (Addendum)
-   The patient will be placed on scheduled and as needed DuoNebs. - We will continue her theophylline and Singulair. - We will hold off long-acting beta agonist.

## 2023-02-12 NOTE — Assessment & Plan Note (Addendum)
-   The patient will be admitted to a medical telemetry bed. - Will continue antibiotic therapy with IV Rocephin and Zithromax. - Mucolytic therapy be provided as well as duo nebs q.i.d. and q.4 hours p.r.n. - We will follow blood cultures. -The patient will be hydrated with IV lactated ringer. - Sepsis is manifested by leukocytosis, tachycardia and tachypnea.

## 2023-02-13 DIAGNOSIS — J189 Pneumonia, unspecified organism: Secondary | ICD-10-CM | POA: Diagnosis not present

## 2023-02-13 DIAGNOSIS — A419 Sepsis, unspecified organism: Secondary | ICD-10-CM | POA: Diagnosis not present

## 2023-02-13 LAB — CBC
HCT: 37.5 % (ref 36.0–46.0)
Hemoglobin: 12.9 g/dL (ref 12.0–15.0)
MCH: 31.6 pg (ref 26.0–34.0)
MCHC: 34.4 g/dL (ref 30.0–36.0)
MCV: 91.9 fL (ref 80.0–100.0)
Platelets: 240 10*3/uL (ref 150–400)
RBC: 4.08 MIL/uL (ref 3.87–5.11)
RDW: 13.7 % (ref 11.5–15.5)
WBC: 12.3 10*3/uL — ABNORMAL HIGH (ref 4.0–10.5)
nRBC: 0 % (ref 0.0–0.2)

## 2023-02-13 LAB — CORTISOL-AM, BLOOD: Cortisol - AM: 3.8 ug/dL — ABNORMAL LOW (ref 6.7–22.6)

## 2023-02-13 LAB — BASIC METABOLIC PANEL
Anion gap: 11 (ref 5–15)
BUN: 9 mg/dL (ref 8–23)
CO2: 24 mmol/L (ref 22–32)
Calcium: 8.9 mg/dL (ref 8.9–10.3)
Chloride: 105 mmol/L (ref 98–111)
Creatinine, Ser: 0.65 mg/dL (ref 0.44–1.00)
GFR, Estimated: >60 mL/min (ref >60)
Glucose, Bld: 97 mg/dL (ref 70–99)
Potassium: 3.6 mmol/L (ref 3.5–5.1)
Sodium: 140 mmol/L (ref 135–145)

## 2023-02-13 LAB — PROTIME-INR
INR: 1.2 (ref 0.8–1.2)
Prothrombin Time: 15.2 s (ref 11.4–15.2)

## 2023-02-13 LAB — MAGNESIUM: Magnesium: 2 mg/dL (ref 1.7–2.4)

## 2023-02-13 MED ORDER — IPRATROPIUM-ALBUTEROL 0.5-2.5 (3) MG/3ML IN SOLN: 3.0000 mL | RESPIRATORY_TRACT | Status: DC | PRN

## 2023-02-13 NOTE — Progress Notes (Signed)
  PROGRESS NOTE    Cassie Ross  ZOX:[id number] DOB: 1960-12-17 DOA: 02/12/2023 PCP: Sallyanne Kuster, NP  105A/105A-AA  LOS: 1 day   Brief hospital course:   Assessment & Plan: Cassie Ross is a 62 y.o. female with medical history significant for CHF, asthma, COPD, GERD, hypertension, dyslipidemia, and type 2 diabetes mellitus who presented to the emergency room with acute onset of malaise and palpitations.     Paroxysmal sinus tachycardia --multiple episodes over the past years, often requiring hospitalization.  Sometimes associated with elevated BP.  Pt is already on home Cardizem 180 mg BID.   --Pt denied anxiety or having panic attacks.  Tachycardia not associated with albuterol use.  No caffeine use.  TSH wnl. Plan: --tele monitor --obtain 24-hour urine collection for metanephrines and catecholamine tests. --cont home cardizem.   Sepsis and PNA both ruled out --no respiratory symptoms, no hypoxia, CXR with no strong evidence of PNA.  Procal neg. --d/c abx  Asthma with COPD (HCC) --stable --cont montelukast and theophylline  Dyslipidemia --cont statin  GERD without esophagitis --cont PPI  Essential hypertension --cont cardizem  Leukocytosis --unclear etiology --monitor off of abx   DVT prophylaxis: Lovenox SQ Code Status: Full code  Family Communication: husband updated at bedside today Level of care: Telemetry Medical Dispo:   The patient is from: home Anticipated d/c is to: home Anticipated d/c date is: tomorrow   Subjective and Interval History:  24-hour urine collection didn't start last night in the ED.  Started this morning.  Pt reported nausea when smelling food.  Husband reported pt has been under more stress recently.     Objective: Vitals:   02/13/23 0354 02/13/23 0822 02/13/23 0824 02/13/23 1700  BP: 138/65  (!) 140/72 136/85  Pulse: 91  78 (!) 121  Resp: 18  18 20   Temp: 98.1 F (36.7 C)  98.3 F (36.8 C) 98.2 F (36.8 C)   TempSrc:   Oral Oral  SpO2: 95% 96% 97% 96%  Weight:      Height:       No intake or output data in the 24 hours ending 02/13/23 2058 Filed Weights   02/12/23 0138  Weight: 85.7 kg    Examination:   Constitutional: NAD, AAOx3 HEENT: conjunctivae and lids normal, EOMI CV: No cyanosis.   RESP: normal respiratory effort, on RA Neuro: II - XII grossly intact.   Psych: Normal mood and affect.  Appropriate judgement and reason   Data Reviewed: I have personally reviewed labs and imaging studies  Time spent: 50 minutes  Darlin Priestly, MD [facility] If 7PM-7AM, please contact night-coverage 02/13/2023, 8:58 PM

## 2023-02-13 NOTE — Plan of Care (Signed)

## 2023-02-13 NOTE — TOC CM/SW Note (Signed)
Transition of Care [facility]) - Inpatient Brief Assessment   Patient Details  Name: Cassie Ross MRN: [id number] Date of Birth: 19-Jan-1961  Transition of Care [facility]) CM/SW Contact:    Allena Katz, LCSW Phone Number: 02/13/2023, 10:07 AM   Clinical Narrative:    Transition of Care Asessment: Insurance and Status: Insurance coverage has been reviewed Patient has primary care physician: Yes Home environment has been reviewed: home with husband Prior level of function:: Independent Prior/Current Home Services: No current home services Social Determinants of Health Reivew: SDOH reviewed no interventions necessary Readmission risk has been reviewed: Yes Transition of care needs: no transition of care needs at this time

## 2023-02-14 DIAGNOSIS — J189 Pneumonia, unspecified organism: Secondary | ICD-10-CM | POA: Diagnosis not present

## 2023-02-14 DIAGNOSIS — A419 Sepsis, unspecified organism: Secondary | ICD-10-CM | POA: Diagnosis not present

## 2023-02-14 LAB — BASIC METABOLIC PANEL
Anion gap: 7 (ref 5–15)
BUN: 10 mg/dL (ref 8–23)
CO2: 26 mmol/L (ref 22–32)
Calcium: 8.6 mg/dL — ABNORMAL LOW (ref 8.9–10.3)
Chloride: 106 mmol/L (ref 98–111)
Creatinine, Ser: 0.63 mg/dL (ref 0.44–1.00)
GFR, Estimated: >60 mL/min (ref >60)
Glucose, Bld: 161 mg/dL — ABNORMAL HIGH (ref 70–99)
Potassium: 4.6 mmol/L (ref 3.5–5.1)
Sodium: 139 mmol/L (ref 135–145)

## 2023-02-14 LAB — URINE CULTURE: Culture: 20000 — AB

## 2023-02-14 LAB — CBC
HCT: 38.7 % (ref 36.0–46.0)
Hemoglobin: 12.9 g/dL (ref 12.0–15.0)
MCH: 31.4 pg (ref 26.0–34.0)
MCHC: 33.3 g/dL (ref 30.0–36.0)
MCV: 94.2 fL (ref 80.0–100.0)
Platelets: 269 10*3/uL (ref 150–400)
RBC: 4.11 MIL/uL (ref 3.87–5.11)
RDW: 13.4 % (ref 11.5–15.5)
WBC: 10.9 10*3/uL — ABNORMAL HIGH (ref 4.0–10.5)
nRBC: 0 % (ref 0.0–0.2)

## 2023-02-14 LAB — MAGNESIUM: Magnesium: 2 mg/dL (ref 1.7–2.4)

## 2023-02-14 MED ORDER — DILTIAZEM HCL 60 MG PO TABS: 120.0000 mg | ORAL_TABLET | Freq: Two times a day (BID) | ORAL | Status: DC

## 2023-02-14 MED ORDER — DILTIAZEM HCL 120 MG PO TABS: 120.0000 mg | ORAL_TABLET | Freq: Two times a day (BID) | ORAL | 0 refills | Status: DC | PRN

## 2023-02-14 MED ORDER — POTASSIUM CHLORIDE CRYS ER 10 MEQ PO TBCR: 10.0000 meq | EXTENDED_RELEASE_TABLET | Freq: Two times a day (BID) | ORAL | Status: AC | PRN

## 2023-02-14 MED ORDER — DILTIAZEM HCL 60 MG PO TABS: 120.0000 mg | ORAL_TABLET | Freq: Two times a day (BID) | ORAL | Status: DC | PRN

## 2023-02-14 MED ORDER — ENOXAPARIN SODIUM 40 MG/0.4ML IJ SOSY
40.0000 mg | PREFILLED_SYRINGE | INTRAMUSCULAR | Status: DC
  Administered 2023-02-14: 40 mg via SUBCUTANEOUS
  Filled 2023-02-14: qty 0.4

## 2023-02-14 NOTE — Progress Notes (Signed)
Received MD order to discharge patient to home, I reviewed discharge instructions, prescriptions, home meds and follow up appointments with patient and patient verbalized understanding

## 2023-02-14 NOTE — Plan of Care (Signed)
  Problem: Fluid Volume: Goal: Hemodynamic stability will improve Outcome: Completed/Met   Problem: Clinical Measurements: Goal: Diagnostic test results will improve Outcome: Completed/Met Goal: Signs and symptoms of infection will decrease Outcome: Completed/Met   Problem: Respiratory: Goal: Ability to maintain adequate ventilation will improve Outcome: Completed/Met   Problem: Education: Goal: Knowledge of General Education information will improve Description: Including pain rating scale, medication(s)/side effects and non-pharmacologic comfort measures Outcome: Completed/Met   Problem: Health Behavior/Discharge Planning: Goal: Ability to manage health-related needs will improve Outcome: Completed/Met   Problem: Clinical Measurements: Goal: Ability to maintain clinical measurements within normal limits will improve Outcome: Completed/Met Goal: Will remain free from infection Outcome: Completed/Met Goal: Diagnostic test results will improve Outcome: Completed/Met Goal: Respiratory complications will improve Outcome: Completed/Met Goal: Cardiovascular complication will be avoided Outcome: Completed/Met   Problem: Activity: Goal: Risk for activity intolerance will decrease Outcome: Completed/Met   Problem: Nutrition: Goal: Adequate nutrition will be maintained Outcome: Completed/Met   Problem: Coping: Goal: Level of anxiety will decrease Outcome: Completed/Met   Problem: Elimination: Goal: Will not experience complications related to bowel motility Outcome: Completed/Met Goal: Will not experience complications related to urinary retention Outcome: Completed/Met   Problem: Pain Management: Goal: General experience of comfort will improve Outcome: Completed/Met   Problem: Safety: Goal: Ability to remain free from injury will improve Outcome: Completed/Met   Problem: Skin Integrity: Goal: Risk for impaired skin integrity will decrease Outcome:  Completed/Met

## 2023-02-14 NOTE — Discharge Summary (Signed)
Physician Discharge Summary   Cassie Ross  female DOB: 30-May-1960  ZOX:[id number]  PCP: Sallyanne Kuster, NP  Admit date: 02/12/2023 Discharge date: 02/14/2023  Admitted From: home Disposition:  home CODE STATUS: Full code  Discharge Instructions     Discharge instructions   Complete by: As directed    We have sent off urine tests to see if you have a condition called Pheochromocytoma, which can cause elevated heart rate.  Stay off albuterol inhaler or nebs as you know they can cause elevated heart rate.  If all workup negative, then you may need to consider anxiety as a reason for your intermittent sudden elevated heart rate and palpitation.  In the mean time, I prescribed you some as needed short-acting cardizem 120 mg, to be taken when you feel the palpitation and fast heart rate.  But before taking, please check blood pressure to make sure the systolic top number is greater than 110.  Please follow up with your PCP for the test results. [facility] Course:  For full details, please see H&P, progress notes, consult notes and ancillary notes.  Briefly,  Cassie Ross is a 62 y.o. female with medical history significant for CHF, asthma, COPD, hypertension, and type 2 diabetes mellitus who presented to the emergency room with acute onset of malaise and palpitations.     Paroxysmal sinus tachycardia --HR 132 on presentation, sinus.  Pt reported multiple episodes over the past years, often requiring hospitalization.  Sometimes associated with elevated BP.  Pt is already on home Cardizem 180 mg BID.   --Pt denied anxiety or having panic attacks.  Tachycardia not associated with albuterol use.  No caffeine use.  TSH wnl. --obtained 24-hour urine collection for metanephrines and catecholamine tests to assess for Pheochromocytoma.  Pt is advised to follow up with PCP for the results of these tests. --cont home cardizem 180 mg BID.  Prescribed short-acting dilt 120 mg  to be taken PRN when HR >120 and systolic BP >110.   Sepsis and PNA both ruled out --no respiratory symptoms, no hypoxia, CXR with no strong evidence of PNA.  Procal neg.  No complaints to suggest other source of infection. --d/c'ed abx   Asthma with COPD (HCC) --stable --cont montelukast and theophylline   Dyslipidemia --cont statin   GERD without esophagitis --cont PPI   Essential hypertension --cont cardizem   Leukocytosis --unclear etiology.  WBC 21.9 on presentation, trended down to 10.9 prior to discharge.   Unless noted above, medications under "STOP" list are ones pt was not taking PTA.  Discharge Diagnoses:  Principal Problem:   Sepsis due to pneumonia [facility]) Active Problems:   Asthma with COPD (HCC)   Dyslipidemia   GERD without esophagitis   Essential hypertension   30 Day Unplanned Readmission Risk Score    Flowsheet Row ED to Hosp-Admission (Current) from 02/12/2023 in [facility] 1C MEDICAL TELEMETRY  30 Day Unplanned Readmission Risk Score (%) 14.34 Filed at 02/14/2023 0801       This score is the patient's risk of an unplanned readmission within 30 days of being discharged (0 -100%). The score is based on dignosis, age, lab data, medications, orders, and past utilization.   Low:  0-14.9   Medium: 15-21.9   High: 22-29.9   Extreme: 30 and above         Discharge Instructions:  Allergies as of 02/14/2023       Reactions  Shellfish Allergy Swelling   Sunflower Oil Swelling   seeds   Levaquin [levofloxacin In D5w] Rash   Pill form   Levaquin [levofloxacin] Rash   Allergy to pill form only. Patient has had IV with no problem.   Penicillins Rash   Has patient had a PCN reaction causing immediate rash, facial/tongue/throat swelling, SOB or lightheadedness with hypotension: no Has patient had a PCN reaction causing severe rash involving mucus membranes or skin necrosis: no Has patient had a PCN reaction that required  hospitalization no Has patient had a PCN reaction occurring within the last 10 years: no If all of the above answers are "NO", then may proceed with Cephalosporin use.        Medication List     STOP taking these medications    albuterol (2.5 MG/3ML) 0.083% nebulizer solution Commonly known as: PROVENTIL   albuterol 108 (90 Base) MCG/ACT inhaler Commonly known as: VENTOLIN HFA   Clobetasol Propionate E 0.05 % emollient cream Generic drug: Clobetasol Prop Emollient Base   estradiol 0.1 MG/GM vaginal cream Commonly known as: ESTRACE   ipratropium-albuterol 0.5-2.5 (3) MG/3ML Soln Commonly known as: DUONEB   Leader Unifine Pentips 32G X 4 MM Misc Generic drug: Insulin Pen Needle   OneTouch Verio test strip Generic drug: glucose blood   OneTouch Verio w/Device Kit       TAKE these medications    acetaminophen 500 MG tablet Commonly known as: TYLENOL Take 1,000 mg by mouth every 8 (eight) hours as needed for moderate pain.   ALPRAZolam 0.5 MG tablet Commonly known as: XANAX Take 1 tablet (0.5 mg total) by mouth 2 (two) times daily as needed. for anxiety. May take 1 extra tab for severe anxiety/panic daily as needed   aspirin EC 81 MG tablet Take 81 mg by mouth daily.   atorvastatin 10 MG tablet Commonly known as: LIPITOR Take 10 mg by mouth daily.   Breztri Aerosphere 160-9-4.8 MCG/ACT Aero Generic drug: Budeson-Glycopyrrol-Formoterol INHALE 2 PUFFS TWICE DAILY   diltiazem 120 MG tablet Commonly known as: CARDIZEM Take 1 tablet (120 mg total) by mouth 2 (two) times daily as needed (for when heart rate >120, but check blood pressure before taking, and only take if systolic BP >110.).   diltiazem 180 MG 24 hr capsule Commonly known as: CARDIZEM CD Take 1 capsule (180 mg total) by mouth 2 (two) times daily.   fluticasone 50 MCG/ACT nasal spray Commonly known as: FLONASE Place 1 spray into both nostrils in the morning and at bedtime.   furosemide 40 MG  tablet Commonly known as: LASIX Take 1 tablet (40 mg total) by mouth 2 (two) times daily as needed (fluid retention.).   loratadine 10 MG tablet Commonly known as: CLARITIN Take 10 mg by mouth daily.   Lubricant Drops/Dual-Action 0.5-0.9 % ophthalmic solution Generic drug: carboxymethylcellul-glycerin Place 1-2 drops into both eyes 3 (three) times daily as needed for dry eyes.   montelukast 10 MG tablet Commonly known as: SINGULAIR TAKE 1 TABLET BY MOUTH AT BEDTIME   multivitamin with minerals Tabs tablet Take 1 tablet by mouth daily.   nitroGLYCERIN 0.4 MG SL tablet Commonly known as: NITROSTAT Place 1 tablet (0.4 mg total) under the tongue every 5 (five) minutes as needed for chest pain. X3 tablets then call 911 if no relief.   pantoprazole 40 MG tablet Commonly known as: PROTONIX Take 1 tablet (40 mg total) by mouth 2 (two) times daily.   potassium chloride 10 MEQ tablet Commonly  known as: KLOR-CON M Take 1 tablet (10 mEq total) by mouth 2 (two) times daily as needed. With furosemide.  Home med. What changed:  when to take this reasons to take this additional instructions   theophylline 600 MG 24 hr tablet Commonly known as: UNIPHYL TAKE ONE TABLET BY MOUTH ONE TIME DAILY         Follow-up Information     Abernathy, Arlyss Repress, NP Follow up in 1 week(s).   Specialty: Nurse Practitioner Contact information: [address] [place] [address] [phone]                 Allergies  Allergen Reactions   Shellfish Allergy Swelling   Sunflower Oil Swelling    seeds   Levaquin [Levofloxacin In D5w] Rash    Pill form   Levaquin [Levofloxacin] Rash    Allergy to pill form only. Patient has had IV with no problem.   Penicillins Rash    Has patient had a PCN reaction causing immediate rash, facial/tongue/throat swelling, SOB or lightheadedness with hypotension: no Has patient had a PCN reaction causing severe rash involving mucus membranes or skin  necrosis: no Has patient had a PCN reaction that required hospitalization no Has patient had a PCN reaction occurring within the last 10 years: no If all of the above answers are "NO", then may proceed with Cephalosporin use.      The results of significant diagnostics from this hospitalization (including imaging, microbiology, ancillary and laboratory) are listed below for reference.   Consultations:   Procedures/Studies: DG Chest Port 1 View  Result Date: 02/12/2023 CLINICAL DATA:  Tachycardia with dizziness and nausea. EXAM: PORTABLE CHEST 1 VIEW COMPARISON:  December 02, 2019 FINDINGS: The heart size and mediastinal contours are within normal limits. Mild atelectasis and/or infiltrate is seen within the left lung base. A small left pleural effusion is noted. No pneumothorax is identified. The visualized skeletal structures are unremarkable. IMPRESSION: 1. Mild left basilar atelectasis and/or infiltrate. 2. Small left pleural effusion. Electronically Signed   By: Aram Candela M.D.   On: 02/12/2023 03:09      Labs: BNP (last 3 results) No results for input(s): "BNP" in the last 8760 hours. Basic Metabolic Panel: Recent Labs  Lab 02/12/23 0126 02/12/23 0200 02/13/23 0440 02/14/23 0242  NA 136  --  140 139  K 3.4*  --  3.6 4.6  CL 103  --  105 106  CO2 22  --  24 26  GLUCOSE 176*  --  97 161*  BUN 16  --  9 10  CREATININE 0.59  --  0.65 0.63  CALCIUM 8.9  --  8.9 8.6*  MG  --  1.9 2.0 2.0   Liver Function Tests: Recent Labs  Lab 02/12/23 0200  AST 26  ALT 26  ALKPHOS 133*  BILITOT 0.8  PROT 7.3  ALBUMIN 3.9   No results for input(s): "LIPASE", "AMYLASE" in the last 168 hours. No results for input(s): "AMMONIA" in the last 168 hours. CBC: Recent Labs  Lab 02/12/23 0126 02/13/23 0440 02/14/23 0242  WBC 21.9*  21.9* 12.3* 10.9*  NEUTROABS 19.1*  --   --   HGB 14.5  14.4 12.9 12.9  HCT 43.0  41.5 37.5 38.7  MCV 94.3  90.6 91.9 94.2  PLT 281  282  240 269   Cardiac Enzymes: No results for input(s): "CKTOTAL", "CKMB", "CKMBINDEX", "TROPONINI" in the last 168 hours. BNP: Invalid input(s): "POCBNP" CBG: No results for input(s): "  GLUCAP" in the last 168 hours. D-Dimer Recent Labs    02/12/23 1057  DDIMER 0.67*   Hgb A1c No results for input(s): "HGBA1C" in the last 72 hours. Lipid Profile No results for input(s): "CHOL", "HDL", "LDLCALC", "TRIG", "CHOLHDL", "LDLDIRECT" in the last 72 hours. Thyroid function studies Recent Labs    02/12/23 0200  TSH 2.644   Anemia work up No results for input(s): "VITAMINB12", "FOLATE", "FERRITIN", "TIBC", "IRON", "RETICCTPCT" in the last 72 hours. Urinalysis    Component Value Date/Time   COLORURINE STRAW (A) 02/12/2023 0226   APPEARANCEUR CLEAR (A) 02/12/2023 0226   APPEARANCEUR Clear 11/27/2021 1400   LABSPEC 1.005 02/12/2023 0226   LABSPEC 1.012 07/09/2012 2150   PHURINE 8.0 02/12/2023 0226   GLUCOSEU NEGATIVE 02/12/2023 0226   GLUCOSEU Negative 07/09/2012 2150   HGBUR NEGATIVE 02/12/2023 0226   BILIRUBINUR NEGATIVE 02/12/2023 0226   BILIRUBINUR Negative 11/27/2021 1400   BILIRUBINUR Negative 07/09/2012 2150   KETONESUR NEGATIVE 02/12/2023 0226   PROTEINUR NEGATIVE 02/12/2023 0226   UROBILINOGEN 0.2 02/17/2020 1217   NITRITE NEGATIVE 02/12/2023 0226   LEUKOCYTESUR MODERATE (A) 02/12/2023 0226   LEUKOCYTESUR Trace 07/09/2012 2150   Sepsis Labs Recent Labs  Lab 02/12/23 0126 02/13/23 0440 02/14/23 0242  WBC 21.9*  21.9* 12.3* 10.9*   Microbiology Recent Results (from the past 240 hour(s))  Blood Culture (routine x 2)     Status: None (Preliminary result)   Collection Time: 02/12/23  2:26 AM   Specimen: BLOOD  Result Value Ref Range Status   Specimen Description BLOOD LEFT ANTECUBITAL  Final   Special Requests   Final    BOTTLES DRAWN AEROBIC AND ANAEROBIC Blood Culture adequate volume   Culture   Final    NO GROWTH 2 DAYS Performed at [facility], [address] [place], [place] [address]    Report Status PENDING  Incomplete  Blood Culture (routine x 2)     Status: None (Preliminary result)   Collection Time: 02/12/23  2:26 AM   Specimen: BLOOD  Result Value Ref Range Status   Specimen Description BLOOD BLOOD LEFT ARM  Final   Special Requests   Final    BOTTLES DRAWN AEROBIC AND ANAEROBIC Blood Culture results may not be optimal due to an inadequate volume of blood received in culture bottles   Culture   Final    NO GROWTH 2 DAYS Performed at [facility], [address] [place], [place] [address]    Report Status PENDING  Incomplete  Resp panel by RT-PCR (RSV, Flu A&B, Covid) Anterior Nasal Swab     Status: None   Collection Time: 02/12/23  2:26 AM   Specimen: Anterior Nasal Swab  Result Value Ref Range Status   SARS Coronavirus 2 by RT PCR NEGATIVE NEGATIVE Final    Comment: (NOTE) SARS-CoV-2 target nucleic acids are NOT DETECTED.  The SARS-CoV-2 RNA is generally detectable in upper respiratory specimens during the acute phase of infection. The lowest concentration of SARS-CoV-2 viral copies this assay can detect is 138 copies/mL. A negative result does not preclude SARS-Cov-2 infection and should not be used as the sole basis for treatment or other patient management decisions. A negative result may occur with  improper specimen collection/handling, submission of specimen other than nasopharyngeal swab, presence of viral mutation(s) within the areas targeted by this assay, and inadequate number of viral copies(<138 copies/mL). A negative result must be combined with clinical observations, patient history, and epidemiological information. The expected result  is Negative.  Fact Sheet for Patients:  BloggerCourse.com  Fact Sheet for Healthcare Providers:  SeriousBroker.it  This test is no t yet approved or cleared by the [place] FDA and  has been  authorized for detection and/or diagnosis of SARS-CoV-2 by FDA under an Emergency Use Authorization (EUA). This EUA will remain  in effect (meaning this test can be used) for the duration of the COVID-19 declaration under Section 564(b)(1) of the Act, 21 U.S.C.section 360bbb-3(b)(1), unless the authorization is terminated  or revoked sooner.       Influenza A by PCR NEGATIVE NEGATIVE Final   Influenza B by PCR NEGATIVE NEGATIVE Final    Comment: (NOTE) The Xpert Xpress SARS-CoV-2/FLU/RSV plus assay is intended as an aid in the diagnosis of influenza from Nasopharyngeal swab specimens and should not be used as a sole basis for treatment. Nasal washings and aspirates are unacceptable for Xpert Xpress SARS-CoV-2/FLU/RSV testing.  Fact Sheet for Patients: BloggerCourse.com  Fact Sheet for Healthcare Providers: SeriousBroker.it  This test is not yet approved or cleared by the [place] FDA and has been authorized for detection and/or diagnosis of SARS-CoV-2 by FDA under an Emergency Use Authorization (EUA). This EUA will remain in effect (meaning this test can be used) for the duration of the COVID-19 declaration under Section 564(b)(1) of the Act, 21 U.S.C. section 360bbb-3(b)(1), unless the authorization is terminated or revoked.     Resp Syncytial Virus by PCR NEGATIVE NEGATIVE Final    Comment: (NOTE) Fact Sheet for Patients: BloggerCourse.com  Fact Sheet for Healthcare Providers: SeriousBroker.it  This test is not yet approved or cleared by the [place] FDA and has been authorized for detection and/or diagnosis of SARS-CoV-2 by FDA under an Emergency Use Authorization (EUA). This EUA will remain in effect (meaning this test can be used) for the duration of the COVID-19 declaration under Section 564(b)(1) of the Act, 21 U.S.C. section 360bbb-3(b)(1), unless the  authorization is terminated or revoked.  Performed at [facility], [address], [place], [place] [address]   Urine Culture     Status: Abnormal (Preliminary result)   Collection Time: 02/12/23  2:26 AM   Specimen: Urine, Random  Result Value Ref Range Status   Specimen Description   Final    URINE, RANDOM Performed at [facility], [address], [place], [place] [address]    Special Requests   Final    NONE Reflexed from [id number] Performed at [facility], [address] [place], [place] [address]    Culture (A)  Final    20,000 COLONIES/mL STREPTOCOCCUS MITIS/ORALIS CULTURE REINCUBATED FOR BETTER GROWTH Performed at [facility], [address] [place], [place] [address]    Report Status PENDING  Incomplete     Total time spend on discharging this patient, including the last patient exam, discussing the hospital stay, instructions for ongoing care as it relates to all pertinent caregivers, as well as preparing the medical discharge records, prescriptions, and/or referrals as applicable, is 35 minutes.    Darlin Priestly, MD  [facility] 02/14/2023, 8:18 AM

## 2023-02-17 ENCOUNTER — Telehealth: Payer: Self-pay

## 2023-02-17 DIAGNOSIS — J189 Pneumonia, unspecified organism: Secondary | ICD-10-CM

## 2023-02-17 LAB — CULTURE, BLOOD (ROUTINE X 2)
Culture: NO GROWTH
Culture: NO GROWTH
Special Requests: ADEQUATE

## 2023-02-17 NOTE — Consult Note (Signed)
[facility] Liaison Note  02/17/2023  Cassie Ross 06/07/1960 [id number]  Location: RN Hospital Liaison screened the patient remotely at [facility].  Insurance: [employer]   Cassie Ross is a 62 y.o. female who is a Primary Care Patient of Sallyanne Kuster, NP-[facility]. The patient was screened for  readmission hospitalization with noted low risk score for unplanned readmission risk with 1 IP in 6 months.  The patient was assessed for potential Care Management service needs for post hospital transition for care coordination. Review of patient's electronic medical record reveals patient was admitted for Sepsis due to pneumonia. Liaison attempted outreach for care management services however unsuccessful. Based upon EMR with this pt's medical history and risk for readmission will make a referral for post hospital prevention readmission with a care coordinator.   Plan: [facility] Liaison will continue to follow progress and disposition to asess for post hospital community care coordination/management needs.  Referral request for community care coordination: Will make a referral for care coordinator for post hospital prevention readmission.   [facility] Care Management/Population Health does not replace or interfere with any arrangements made by the Inpatient Transition of Care team.   For questions contact:   Elliot Cousin, RN, [facility] Liaison    [facility], Population Health Office Hours MTWF  8:00 am-6:00 pm Direct Dial: [phone] mobile [phone] [Office toll free line] Office Hours are M-F 8:30 - 5 pm [email or web address]

## 2023-02-18 ENCOUNTER — Telehealth: Payer: Self-pay | Admitting: *Deleted

## 2023-02-18 NOTE — Progress Notes (Signed)
  Care Coordination   Note   02/18/2023 Name: Cassie Ross MRN: [id number] DOB: [birth year]  Cassie Ross is a 62 y.o. year old female who sees Sallyanne Kuster, NP for primary care. I reached out to Cassie Ross by phone today to offer care coordination services.  [name] was given information about Care Coordination services today including:   The Care Coordination services include support from the care team which includes your Nurse Coordinator, Clinical Social Worker, or Pharmacist.  The Care Coordination team is here to help remove barriers to the health concerns and goals most important to you. Care Coordination services are voluntary, and the patient may decline or stop services at any time by request to their care team member.   Care Coordination Consent Status: Patient agreed to services and verbal consent obtained.   Follow up plan:  Telephone appointment with care coordination team member scheduled for:  02/21/2023  Encounter Outcome:  Patient Scheduled from referral   Burman Nieves, [facility] Care Coordination Care Guide Direct Dial: [phone]

## 2023-02-21 ENCOUNTER — Ambulatory Visit: Payer: Self-pay

## 2023-02-21 NOTE — Patient Outreach (Signed)
  Care Coordination   Initial Visit Note   02/21/2023 Name: Cassie Ross MRN: [id number] DOB: 1960-09-24  Cassie Ross is a 62 y.o. year old female who sees Sallyanne Kuster, NP for primary care. I spoke with  Cassie Ross by phone today.  What matters to the patients health and wellness today?  Per chart review patient hospitalized from 02/12/23- 02/14/23 unclear etiology, WBC elevated, paroxysmal sinus tachycardia.  Patient states pneumonia/ sepsis was ruled out during hospital stay. Patient states she has periodic episodes of nausea and feeling like she is going to pass out.  She states she reviewed over her lab work in [employer] and saw that her calcium was low. Patient self administering calcium 1200 mg/ day.  Patient states she is concerned because she is not really sure what is going on. She reports using google to assess symptoms.   Patient has prescheduled appointment with primary care provider on 02/25/23.   Patient states she is not monitoring her pulse or BP at home. She states she doesn't have a pulse oximeter however she does have a blood pressure monitor.  Patient reports next scheduled follow up visit with cardiologist is 06/04/23.     Goals Addressed             This Visit's Progress    Post hospital admission follow up and management of health conditions.       Interventions Today    Flowsheet Row Most Recent Value  Chronic Disease   Chronic disease during today's visit Hypertension (HTN), Other  [paroxysmal sinus tachycardia]  General Interventions   General Interventions Discussed/Reviewed General Interventions Discussed, Labs, Doctor Visits, Vaccines  [evaluation of current treatment plan for  HTN/ tachycardia and patients adherence to plan as established by provider. Assessed for BP /pulse readings and any new/ ongoing symptoms.]  Labs --  [reviewed/ discussed labs related to WBC count]  Vaccines Flu  Pearletha Furl for recent administered vaccines.]  Doctor  Visits Discussed/Reviewed Doctor Visits Discussed  [reviewed upcoming provider vists. Confirmed patient has transport to appointments.]  Education Interventions   Education Provided Provided Education  [. Advised to monitor BP and pulse daily and record. Advised to take readings to upcoming primary care provider visit on 02/25/23. Patient advised to notify provider for any new/ ongoing symptoms.  Advised to call 911 for severe symptoms.]  Pharmacy Interventions   Pharmacy Dicussed/Reviewed Pharmacy Topics Discussed  [reviewed medication list. Discussed importance of monitor BP and pulse with taking dilitazem.  Advised to take all medications as prescribed.]               SDOH assessments and interventions completed:  Yes  SDOH Interventions Today    Flowsheet Row Most Recent Value  SDOH Interventions   Food Insecurity Interventions Intervention Not Indicated  Housing Interventions Intervention Not Indicated  Transportation Interventions Intervention Not Indicated  Utilities Interventions Intervention Not Indicated        Care Coordination Interventions:  Yes, provided   Follow up plan: Follow up call scheduled for 03/11/23    Encounter Outcome:  Patient Visit Completed   George Ina RN,BSN,CCM [facility]  [facility], [facility] coordinator / Case Manager Phone: [phone]

## 2023-02-21 NOTE — Patient Instructions (Signed)
Visit Information  Thank you for taking time to visit with me today. Please don't hesitate to contact me if I can be of assistance to you.   Following are the goals we discussed today:   Goals Addressed             This Visit's Progress    Post hospital admission follow up and management of health conditions.       Interventions Today    Flowsheet Row Most Recent Value  Chronic Disease   Chronic disease during today's visit Hypertension (HTN), Other  [paroxysmal sinus tachycardia]  General Interventions   General Interventions Discussed/Reviewed General Interventions Discussed, Labs, Doctor Visits, Vaccines  [evaluation of current treatment plan for  HTN/ tachycardia and patients adherence to plan as established by provider. Assessed for BP /pulse readings and any new/ ongoing symptoms.]  Labs --  [reviewed/ discussed labs related to WBC count]  Vaccines Flu  Pearletha Furl for recent administered vaccines.]  Doctor Visits Discussed/Reviewed Doctor Visits Discussed  [reviewed upcoming provider vists. Confirmed patient has transport to appointments.]  Education Interventions   Education Provided Provided Education  [. Advised to monitor BP and pulse daily and record. Advised to take readings to upcoming primary care provider visit on 02/25/23. Patient advised to notify provider for any new/ ongoing symptoms.  Advised to call 911 for severe symptoms.]  Pharmacy Interventions   Pharmacy Dicussed/Reviewed Pharmacy Topics Discussed  [reviewed medication list. Discussed importance of monitor BP and pulse with taking dilitazem.  Advised to take all medications as prescribed.]               Our next appointment is by telephone on 03/11/23 at 2 pm  Please call the care guide team at [phone] if you need to cancel or reschedule your appointment.   If you are experiencing a Mental Health or Behavioral Health Crisis or need someone to talk to, please call the Suicide and Crisis Lifeline:  988 call 1-800-273-TALK (toll free, 24 hour hotline)  Patient verbalizes understanding of instructions and care plan provided today and agrees to view in MyChart. Active MyChart status and patient understanding of how to access instructions and care plan via MyChart confirmed with patient.     George Ina RN,BSN,CCM [place]  [facility], [facility] coordinator / Case Manager Phone: [phone]

## 2023-02-25 ENCOUNTER — Telehealth: Payer: Self-pay | Admitting: Nurse Practitioner

## 2023-02-25 ENCOUNTER — Encounter: Payer: Self-pay | Admitting: Nurse Practitioner

## 2023-02-25 ENCOUNTER — Ambulatory Visit (INDEPENDENT_AMBULATORY_CARE_PROVIDER_SITE_OTHER): Payer: PPO | Admitting: Nurse Practitioner

## 2023-02-25 VITALS — BP 130/85 | HR 80 | Temp 98.4°F | Resp 16 | Ht 63.0 in | Wt 185.6 lb

## 2023-02-25 DIAGNOSIS — R7989 Other specified abnormal findings of blood chemistry: Secondary | ICD-10-CM | POA: Diagnosis not present

## 2023-02-25 DIAGNOSIS — I10 Essential (primary) hypertension: Secondary | ICD-10-CM | POA: Diagnosis not present

## 2023-02-25 DIAGNOSIS — F411 Generalized anxiety disorder: Secondary | ICD-10-CM | POA: Diagnosis not present

## 2023-02-25 DIAGNOSIS — I479 Paroxysmal tachycardia, unspecified: Secondary | ICD-10-CM

## 2023-02-25 DIAGNOSIS — Z09 Encounter for follow-up examination after completed treatment for conditions other than malignant neoplasm: Secondary | ICD-10-CM

## 2023-02-25 MED ORDER — ALPRAZOLAM 0.5 MG PO TABS: 0.5000 mg | ORAL_TABLET | Freq: Two times a day (BID) | ORAL | 2 refills | Status: DC | PRN

## 2023-02-25 NOTE — Telephone Encounter (Signed)
Awaiting 02/25/23 office notes for Urgent Cardiology referral-Toni

## 2023-02-25 NOTE — Progress Notes (Unsigned)
[facility] [place], [place] [address] [place] [address] [id number]                                   Transitional Care Clinic   [facility] Discharge Acute Issues Care Follow Up                                                                        Patient Demographics  Cassie Ross, is a 62 y.o. female  DOB 24-Feb-1961  MRN [id number].  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    DVT (deep venous thrombosis) (HCC)    GERD (gastroesophageal reflux disease)    Hernia, hiatal 2007   HLD (hyperlipidemia)    Hypertension    Hypertension    Pneumonia    Seasonal allergies     Past Surgical History:  Procedure Laterality Date   BLADDER SUSPENSION N/A 05/23/2020   Procedure: TRANSVAGINAL TAPE (TVT) PROCEDURE;  Surgeon: Nadara Mustard, MD;  Location: [facility] ORS;  Service: Gynecology;  Laterality: N/A;   cataract surgery     CHOLECYSTECTOMY     COLONOSCOPY WITH PROPOFOL N/A 01/06/2018   Procedure: COLONOSCOPY WITH PROPOFOL;  Surgeon: Christena Deem, MD;  Location: [facility] ENDOSCOPY;  Service: Endoscopy;  Laterality: N/A;   CYSTOCELE REPAIR N/A 05/23/2020   Procedure: ANTERIOR REPAIR (CYSTOCELE);  Surgeon: Nadara Mustard, MD;  Location: [facility] ORS;  Service: Gynecology;  Laterality: N/A;   CYSTOSCOPY N/A 05/23/2020   Procedure: CYSTOSCOPY;  Surgeon: Nadara Mustard, MD;  Location: [facility] ORS;  Service: Gynecology;  Laterality: N/A;   ESOPHAGEAL DILATION     ESOPHAGOGASTRODUODENOSCOPY (EGD) WITH  PROPOFOL N/A 01/06/2018   Procedure: ESOPHAGOGASTRODUODENOSCOPY (EGD) WITH PROPOFOL;  Surgeon: Christena Deem, MD;  Location: [facility] ENDOSCOPY;  Service: Endoscopy;  Laterality: N/A;   EYE SURGERY Bilateral 2014   cataract    HERNIA REPAIR     hiatial hernia repair     NASAL SINUS SURGERY     PUBOVAGINAL SLING N/A 05/23/2020   Procedure: Leonides Grills;  Surgeon: Nadara Mustard, MD;  Location: [facility] ORS;  Service: Gynecology;  Laterality: N/A;   RECTOCELE REPAIR  05/23/2020   Procedure: POSTERIOR REPAIR (RECTOCELE);  Surgeon: Nadara Mustard, MD;  Location: [facility] ORS;  Service: Gynecology;;   VAGINAL HYSTERECTOMY Bilateral 05/23/2020   Procedure: HYSTERECTOMY VAGINAL BILATERIAL SALPINGO OOPHORECTOMY;  Surgeon: Nadara Mustard, MD;  Location: [facility] ORS;  Service: Gynecology;  Laterality: Bilateral;  LEFT fallopian tube, BILAT ovaries       Recent HPI and Hospital Course  Hospital Course:  For  full details, please see H&P, progress notes, consult notes and ancillary notes.  Briefly,  Cassie Ross is a 62 y.o. female with medical history significant for CHF, asthma, COPD, hypertension, and type 2 diabetes mellitus who presented to the emergency room with acute onset of malaise and palpitations.     Paroxysmal sinus tachycardia --HR 132 on presentation, sinus.  Pt reported multiple episodes over the past years, often requiring hospitalization.  Sometimes associated with elevated BP.  Pt is already on home Cardizem 180 mg BID.   --Pt denied anxiety or having panic attacks.  Tachycardia not associated with albuterol use.  No caffeine use.  TSH wnl. --obtained 24-hour urine collection for metanephrines and catecholamine tests to assess for Pheochromocytoma.  Pt is advised to follow up with PCP for the results of these tests. --cont home cardizem 180 mg BID.  Prescribed short-acting dilt 120 mg to be taken PRN when HR >120 and systolic BP >110.   Sepsis and PNA both ruled out --no  respiratory symptoms, no hypoxia, CXR with no strong evidence of PNA.  Procal neg.  No complaints to suggest other source of infection. --d/c'ed abx   Asthma with COPD (HCC) --stable --cont montelukast and theophylline   Dyslipidemia --cont statin   GERD without esophagitis --cont PPI   Essential hypertension --cont cardizem   Leukocytosis --unclear etiology.  WBC 21.9 on presentation, trended down to 10.9 prior to discharge.    Post Hospital Acute Care Issue to be followed in the Clinic   Discharge Diagnoses:  Principal Problem:   Sepsis due to pneumonia [facility]) Active Problems:   Asthma with COPD (HCC)   Dyslipidemia   GERD without esophagitis   Essential hypertension   Subjective:   Cassie Ross today has, No headache, No chest pain, No abdominal pain - No Nausea, No new weakness tingling or numbness, No Cough - SOB.   Assessment & Plan   1. Hospital discharge follow-up (Primary) Treated for possible sepsis, discharged after 2 days, having issues with racing heartbeat and palpitations, was recommended to see a cardiologist.   2. Tachycardia, paroxysmal (HCC) Referred to cardiology - Ambulatory referral to Cardiology  3. Low serum cortisol level Was checked in hospital, 24 hour urine metanephrine testing was negative. Will consider endocrinology referral pending cardiology recommendations.   4. Essential hypertension Referred to cardiology  - Ambulatory referral to Cardiology  5. Generalized anxiety disorder Continue alprazolam as prescribed. Follow up in 3 months for additional refills.  - ALPRAZolam (XANAX) 0.5 MG tablet; Take 1 tablet (0.5 mg total) by mouth 2 (two) times daily as needed for anxiety. for anxiety. May take 1 extra tab for severe anxiety/panic daily as needed  Dispense: 90 tablet; Refill: 2   Reason for frequent admissions/ER visits    pneumonia Sepsis    Objective:   Vitals:   02/25/23 1358  BP: 130/85  Pulse: 80  Resp: 16   Temp: 98.4 F (36.9 C)  SpO2: 93%  Weight: 185 lb 9.6 oz (84.2 kg)  Height: 5\' 3"  (1.6 m)    Wt Readings from Last 3 Encounters:  02/25/23 185 lb 9.6 oz (84.2 kg)  02/12/23 189 lb (85.7 kg)  12/03/22 186 lb 6.4 oz (84.6 kg)    Allergies as of 02/25/2023       Reactions   Shellfish Allergy Swelling   Sunflower Oil Swelling   seeds   Levaquin [levofloxacin In D5w] Rash   Pill form   Levaquin [levofloxacin] Rash   Allergy  to pill form only. Patient has had IV with no problem.   Penicillins Rash   Has patient had a PCN reaction causing immediate rash, facial/tongue/throat swelling, SOB or lightheadedness with hypotension: no Has patient had a PCN reaction causing severe rash involving mucus membranes or skin necrosis: no Has patient had a PCN reaction that required hospitalization no Has patient had a PCN reaction occurring within the last 10 years: no If all of the above answers are "NO", then may proceed with Cephalosporin use.        Medication List        Accurate as of February 25, 2023 11:59 PM. If you have any questions, ask your nurse or doctor.          acetaminophen 500 MG tablet Commonly known as: TYLENOL Take 1,000 mg by mouth every 8 (eight) hours as needed for moderate pain.   albuterol 108 (90 Base) MCG/ACT inhaler Commonly known as: VENTOLIN HFA Inhale 2 puffs into the lungs every 6 (six) hours as needed for wheezing or shortness of breath.   ALPRAZolam 0.5 MG tablet Commonly known as: XANAX Take 1 tablet (0.5 mg total) by mouth 2 (two) times daily as needed for anxiety. for anxiety. May take 1 extra tab for severe anxiety/panic daily as needed What changed: reasons to take this Changed by: Cassie Ross   aspirin EC 81 MG tablet Take 81 mg by mouth daily.   atorvastatin 10 MG tablet Commonly known as: LIPITOR Take 10 mg by mouth daily.   Breztri Aerosphere 160-9-4.8 MCG/ACT Aero Generic drug: Budeson-Glycopyrrol-Formoterol INHALE 2  PUFFS TWICE DAILY   diltiazem 120 MG tablet Commonly known as: CARDIZEM Take 1 tablet (120 mg total) by mouth 2 (two) times daily as needed (for when heart rate >120, but check blood pressure before taking, and only take if systolic BP >110.).   diltiazem 180 MG 24 hr capsule Commonly known as: CARDIZEM CD Take 1 capsule (180 mg total) by mouth 2 (two) times daily.   fluticasone 50 MCG/ACT nasal spray Commonly known as: FLONASE Place 1 spray into both nostrils in the morning and at bedtime.   furosemide 40 MG tablet Commonly known as: LASIX Take 1 tablet (40 mg total) by mouth 2 (two) times daily as needed (fluid retention.).   loratadine 10 MG tablet Commonly known as: CLARITIN Take 10 mg by mouth daily.   Lubricant Drops/Dual-Action 0.5-0.9 % ophthalmic solution Generic drug: carboxymethylcellul-glycerin Place 1-2 drops into both eyes 3 (three) times daily as needed for dry eyes.   montelukast 10 MG tablet Commonly known as: SINGULAIR TAKE 1 TABLET BY MOUTH AT BEDTIME   multivitamin with minerals Tabs tablet Take 1 tablet by mouth daily.   nitroGLYCERIN 0.4 MG SL tablet Commonly known as: NITROSTAT Place 1 tablet (0.4 mg total) under the tongue every 5 (five) minutes as needed for chest pain. X3 tablets then call 911 if no relief.   pantoprazole 40 MG tablet Commonly known as: PROTONIX Take 1 tablet (40 mg total) by mouth 2 (two) times daily.   potassium chloride 10 MEQ tablet Commonly known as: KLOR-CON M Take 1 tablet (10 mEq total) by mouth 2 (two) times daily as needed. With furosemide.  Home med.   theophylline 600 MG 24 hr tablet Commonly known as: UNIPHYL TAKE ONE TABLET BY MOUTH ONE TIME DAILY         Physical Exam: Constitutional: Patient appears well-developed and well-nourished. Not in obvious distress. HENT: Normocephalic, atraumatic, External right  and left ear normal. Oropharynx is clear and moist.  CVS: RRR, S1/S2 +, no murmurs, no gallops, no  carotid bruit.  Pulmonary: Effort and breath sounds normal, no stridor, rhonchi, wheezes, rales.      Data Review   Micro Results No results found for this or any previous visit (from the past 240 hours).   CBC No results for input(s): "WBC", "HGB", "HCT", "PLT", "MCV", "MCH", "MCHC", "RDW", "LYMPHSABS", "MONOABS", "EOSABS", "BASOSABS", "BANDABS" in the last 168 hours.  Invalid input(s): "NEUTRABS", "BANDSABD"  Chemistries  No results for input(s): "NA", "K", "CL", "CO2", "GLUCOSE", "BUN", "CREATININE", "CALCIUM", "MG", "AST", "ALT", "ALKPHOS", "BILITOT" in the last 168 hours.  Invalid input(s): "GFRCGP" ------------------------------------------------------------------------------------------------------------------ estimated creatinine clearance is 73 mL/min (by C-G formula based on SCr of 0.63 mg/dL). ------------------------------------------------------------------------------------------------------------------ No results for input(s): "HGBA1C" in the last 72 hours. ------------------------------------------------------------------------------------------------------------------ No results for input(s): "CHOL", "HDL", "LDLCALC", "TRIG", "CHOLHDL", "LDLDIRECT" in the last 72 hours. ------------------------------------------------------------------------------------------------------------------ No results for input(s): "TSH", "T4TOTAL", "T3FREE", "THYROIDAB" in the last 72 hours.  Invalid input(s): "FREET3" ------------------------------------------------------------------------------------------------------------------ No results for input(s): "VITAMINB12", "FOLATE", "FERRITIN", "TIBC", "IRON", "RETICCTPCT" in the last 72 hours.  Coagulation profile No results for input(s): "INR", "PROTIME" in the last 168 hours.  No results for input(s): "DDIMER" in the last 72 hours.  Cardiac Enzymes No results for input(s): "CKMB", "TROPONINI", "MYOGLOBIN" in the last 168  hours.  Invalid input(s): "CK" ------------------------------------------------------------------------------------------------------------------ Invalid input(s): "POCBNP"  Return in about 3 months (around 06/04/2023) for F/U, anxiety med refill, Melba Araki PCP.   Time Spent in minutes  45 Time spent with patient included reviewing progress notes, labs, imaging studies, and discussing plan for follow up.   This patient was seen by Cassie Kuster, FNP-C in collaboration with Dr. Beverely Risen as a part of collaborative care agreement.    Cassie Kuster MSN, FNP-C on 02/25/2023 at 2:48 PM   **Disclaimer: This note may have been dictated with voice recognition software. Similar sounding words can inadvertently be transcribed and this note may contain transcription errors which may not have been corrected upon publication of note.**

## 2023-02-26 ENCOUNTER — Encounter: Payer: Self-pay | Admitting: Nurse Practitioner

## 2023-02-27 ENCOUNTER — Telehealth: Payer: Self-pay | Admitting: Nurse Practitioner

## 2023-02-27 NOTE — Telephone Encounter (Signed)
Cardiology referral faxed to [facility] [place] per patient request; [phone]. Notified patient. Gave pt telephone [phone]

## 2023-03-11 ENCOUNTER — Ambulatory Visit: Payer: Self-pay

## 2023-03-11 NOTE — Patient Outreach (Signed)
  Care Coordination   03/11/2023 Name: Cassie Ross MRN: [id number] DOB: September 01, 1960   Care Coordination Outreach Attempts:  An unsuccessful outreach was attempted for an appointment today.   HIPAA compliant message left with return call phone number.   Follow Up Plan:  Additional outreach attempts will be made to offer the patient complex care management information and services.   Encounter Outcome:  No Answer   Care Coordination Interventions:  No, not indicated    Arvin Seip RN,BSN,CCM [facility]  [facility], [facility] coordinator / Case Manager Phone: [phone]

## 2023-03-13 ENCOUNTER — Other Ambulatory Visit: Payer: Self-pay

## 2023-03-13 MED ORDER — RELION MINI PEN NEEDLES 31G X 6 MM MISC: 3 refills | Status: DC

## 2023-03-17 ENCOUNTER — Telehealth: Payer: Self-pay

## 2023-03-17 MED ORDER — PREDNISONE 10 MG (21) PO TBPK: ORAL_TABLET | ORAL | 0 refills | Status: DC

## 2023-03-17 MED ORDER — AZITHROMYCIN 250 MG PO TABS: ORAL_TABLET | ORAL | 0 refills | Status: AC

## 2023-03-17 NOTE — Telephone Encounter (Signed)
 Patient notified

## 2023-03-17 NOTE — Telephone Encounter (Signed)
 Patient is suppose to call back with covid test results.

## 2023-03-27 ENCOUNTER — Telehealth: Payer: Self-pay | Admitting: Nurse Practitioner

## 2023-03-27 NOTE — Telephone Encounter (Signed)
03/11/21-03/11/23 Medical Records faxed to [employer]; [phone] Boatman

## 2023-03-28 ENCOUNTER — Telehealth: Payer: Self-pay | Admitting: Nurse Practitioner

## 2023-03-28 ENCOUNTER — Telehealth: Payer: Self-pay | Admitting: *Deleted

## 2023-03-28 NOTE — Progress Notes (Unsigned)
Complex Care Management Care Guide Note  03/28/2023 Name: [name] MRN: [id number] DOB: May 05, 1960  Cassie Ross is a 63 y.o. year old female who is a primary care patient of Sallyanne Kuster, NP and is actively engaged with the care management team. I reached out to Cassie Ross by phone today to assist with re-scheduling  with the RN Case Manager.  Follow up plan: Unsuccessful telephone outreach attempt made. A HIPAA compliant phone message was left for the patient providing contact information and requesting a return call.  Burman Nieves, CMA, Care Guide [facility]  [facility], [facility] Guide Direct Dial: [phone]  Fax: [phone] Website: [email or web address]

## 2023-03-28 NOTE — Telephone Encounter (Signed)
03/11/21-03/11/23 MR uploaded to [employer]; [email or web address] @ 9:53 a.m.-Toni

## 2023-03-30 ENCOUNTER — Other Ambulatory Visit: Payer: Self-pay | Admitting: Nurse Practitioner

## 2023-03-30 DIAGNOSIS — I10 Essential (primary) hypertension: Secondary | ICD-10-CM

## 2023-03-31 NOTE — Progress Notes (Signed)
Complex Care Management Care Guide Note  03/31/2023 Name: Cassie Ross MRN: [id number] DOB: 1961/02/04  Cassie Ross is a 63 y.o. year old female who is a primary care patient of Sallyanne Kuster, NP and is actively engaged with the care management team. I reached out to Cassie Ross by phone today to assist with re-scheduling  with the RN Case Manager.  Follow up plan: we have been unable to reach pt to reschedule  Burman Nieves, CMA, Care Guide [facility]  [facility], [facility] Guide Direct Dial: [phone]  Fax: [phone] Website: [email or web address]

## 2023-04-01 ENCOUNTER — Telehealth: Payer: Self-pay | Admitting: *Deleted

## 2023-04-01 NOTE — Progress Notes (Signed)
Complex Care Management Care Guide Note  04/01/2023 Name: Cassie Ross MRN: [id number] DOB: 02-11-1961  Cassie Ross is a 63 y.o. year old female who is a primary care patient of Sallyanne Kuster, NP and is actively engaged with the care management team. I reached out to Cassie Ross by phone today to assist with re-scheduling  with the RN Case Manager.  Follow up plan: Telephone appointment with complex care management team member scheduled for:  04/07/2023  Burman Nieves, CMA, Care Guide [facility], [facility] Guide Direct Dial: [phone]  Fax: [phone] Website: [email or web address]

## 2023-04-07 ENCOUNTER — Ambulatory Visit: Payer: Self-pay

## 2023-04-07 NOTE — Patient Instructions (Signed)
Visit Information  Thank you for taking time to visit with me today. Please don't hesitate to contact me if I can be of assistance to you.   Following are the goals we discussed today:   Goals Addressed             This Visit's Progress    Post hospital admission follow up and management of health conditions.       Interventions Today    Flowsheet Row Most Recent Value  Chronic Disease   Chronic disease during today's visit Hypertension (HTN), Other  [paroximal sinus tachycardia, asthma]  General Interventions   General Interventions Discussed/Reviewed General Interventions Reviewed, Doctor Visits  [evaluation of current treatment plan for listed health conditions and patients adherence to plan as established by provider. Assessed for blood pressure, increase in asthma symptoms  and ongoing tachycardia symptoms.]  Doctor Visits Discussed/Reviewed Doctor Visits Reviewed  [reviewed upcoming provider visits. Advised to keep follow up visits with providers as recommended. Advised to schedule appointment with new cardiologist.]  Education Interventions   Education Provided Provided Education, Provided Printed Education  [Advised to contact her insurance carrier to request assistance with purchase of BP monitor. Discussed importance of monitoring BP/ pulse at least 3-4 x per week and recording.  Advised to notify provider of readings outside of established parameter.]  Provided Verbal Education On Other  [Advised to avoid food / drink items with caffeine. Education article sent to patient regarding tachycardia]  Pharmacy Interventions   Pharmacy Dicussed/Reviewed Pharmacy Topics Reviewed  [medications reviewed and compliance discussed.]              Our next appointment is by telephone on 05/05/23 at 10:45 am  Please call the care guide team at [phone] if you need to cancel or reschedule your appointment.   If you are experiencing a Mental Health or Behavioral Health Crisis or  need someone to talk to, please call the Suicide and Crisis Lifeline: 988 call 1-800-273-TALK (toll free, 24 hour hotline)  Patient verbalizes understanding of instructions and care plan provided today and agrees to view in MyChart. Active MyChart status and patient understanding of how to access instructions and care plan via MyChart confirmed with patient.     George Ina RN, BSN, CCM [place]  [facility], Population Health Case Manager Phone: [phone]    Sinus Tachycardia  Sinus tachycardia is a fast heartbeat. In sinus tachycardia, the heart beats more than 100 times a minute. Sinus tachycardia starts in the part of the heart called the sinoatrial (SA) node. Sinus tachycardia may be harmless, or it may be a sign of a serious condition. What are the causes? This condition may be caused by: Exercise or exertion. A fever. Pain. Loss of body fluids (dehydration). Severe bleeding (hemorrhage). Anxiety and stress. Certain substances, including: Alcohol. Caffeine. Tobacco and nicotine products. Cold medicines. Illegal drugs. Medical conditions including: Heart disease. An infection. An overactive thyroid (hyperthyroidism). A lack of red blood cells (anemia). What are the signs or symptoms? Symptoms of this condition include: A feeling that the heart is beating fast or unevenly (palpitations). Suddenly noticing your heartbeat (cardiac awareness). Lightheadedness. Tiredness (fatigue). Shortness of breath. Chest pain. Nausea. Fainting. How is this diagnosed? This condition is diagnosed with: A physical exam. Tests or monitoring, such as: Blood tests. An electrocardiogram (ECG). This test measures the electrical activity of the heart. Ambulatory cardiac monitor. This records your heartbeats for 24 hours or more. You may be referred to a heart  specialist (cardiologist). How is this treated? Treatment for this condition depends on the cause.  Treatment may involve: Treating the underlying condition. Taking new medicines or changing your current medicines as told by your health care provider. Making changes to your diet or lifestyle. Follow these instructions at home: Lifestyle  Do not use any products that contain nicotine or tobacco. These products include cigarettes, chewing tobacco, and vaping devices, such as e-cigarettes. If you need help quitting, ask your health care provider. Do not use illegal drugs, such as cocaine. Learn relaxation methods to help you when you get stressed or anxious. These include deep breathing. Avoid caffeine or other stimulants, including herbal stimulants that are found in energy drinks. Alcohol use  Do not drink alcohol if: Your health care provider tells you not to drink. You are pregnant, may be pregnant, or are planning to become pregnant. If you drink alcohol: Limit how much you have to: 0-1 drink a day for women. 0-2 drinks a day for men. Know how much alcohol is in your drink. In the [place] one drink equals one 12 oz bottle of beer (355 mL), one 5 oz glass of wine (148 mL), or one 1 oz glass of hard liquor (44 mL). General instructions Drink enough fluids to keep your urine pale yellow. Take over-the-counter and prescription medicines only as told by your health care provider. Ask your health care provider about taking vitamins, herbs, and supplements. Contact a health care provider if: You have vomiting or diarrhea that does not go away. You have a fever. You have weakness or dizziness. You feel faint. Get help right away if: You have pain in your chest, upper arms, jaw, or neck. You have palpitations that do not go away. Summary In sinus tachycardia, the heart beats more than 100 times a minute. Sinus tachycardia may be harmless, or it may be a sign of a serious condition. Treatment for this condition depends on the cause or the underlying condition. Get help right away if you  have pain in your chest, upper arms, jaw, or neck. This information is not intended to replace advice given to you by your health care provider. Make sure you discuss any questions you have with your health care provider. Document Revised: 06/26/2021 Document Reviewed: 06/26/2021 Elsevier Patient Education  2024 [employer].

## 2023-04-07 NOTE — Patient Outreach (Signed)
  Care Coordination   Follow Up Visit Note   04/07/2023 Name: Cassie Ross MRN: [id number] DOB: Oct 15, 1960  Cassie Ross is a 63 y.o. year old female who sees Sallyanne Kuster, NP for primary care. I spoke with  Cassie Ross by phone today.  What matters to the patients health and wellness today?  Patient states she is doing ok.  She states     Goals Addressed             This Visit's Progress    Post hospital admission follow up and management of health conditions.       Interventions Today    Flowsheet Row Most Recent Value  Chronic Disease   Chronic disease during today's visit Hypertension (HTN), Other  [paroximal sinus tachycardia, asthma]  General Interventions   General Interventions Discussed/Reviewed General Interventions Reviewed, Doctor Visits  [evaluation of current treatment plan for listed health conditions and patients adherence to plan as established by provider. Assessed for blood pressure, increase in asthma symptoms  and ongoing tachycardia symptoms.]  Doctor Visits Discussed/Reviewed Doctor Visits Reviewed  [reviewed upcoming provider visits. Advised to keep follow up visits with providers as recommended. Advised to schedule appointment with new cardiologist.]  Education Interventions   Education Provided Provided Education, Provided Printed Education  [Advised to contact her insurance carrier to request assistance with purchase of BP monitor. Discussed importance of monitoring BP/ pulse at least 3-4 x per week and recording.  Advised to notify provider of readings outside of established parameter.]  Provided Verbal Education On Other  [Advised to avoid food / drink items with caffeine. Education article sent to patient regarding tachycardia]  Pharmacy Interventions   Pharmacy Dicussed/Reviewed Pharmacy Topics Reviewed  [medications reviewed and compliance discussed.]              SDOH assessments and interventions completed:  No     Care  Coordination Interventions:  Yes, provided   Follow up plan: Follow up call scheduled for 05/05/23 at 10:45 am    Encounter Outcome:  Patient Visit Completed   George Ina RN, BSN, CCM [place]  [facility], Population Health Case Manager Phone: [phone]

## 2023-04-08 ENCOUNTER — Telehealth: Payer: Self-pay | Admitting: Nurse Practitioner

## 2023-04-08 NOTE — Telephone Encounter (Signed)
Patient called regarding cardiology referral to [facility]. I told her I received 2 confirmations it went through on 02/27/23. We compared fax #'s. I faxed again-Toni

## 2023-04-10 LAB — CATECHOLAMINES,UR.,FREE,24 HR
Dopamine, Rand Ur: 95 ug/L
Dopamine, Ur, 24Hr: 95 ug/(24.h) (ref 0–510)
Epinephrine, Rand Ur: <3 ug/L
Epinephrine, U, 24Hr: <3 ug/(24.h) (ref 0–20)
Norepinephrine, Rand Ur: 41 ug/L
Norepinephrine,U,24H: 41 ug/(24.h) (ref 0–135)

## 2023-04-10 LAB — METANEPHRINES, URINE, 24 HOUR
Metaneph Total, Ur: 21 ug/L
Metanephrines, 24H Ur: 21 ug/(24.h) — ABNORMAL LOW (ref 36–209)
Normetanephrine, 24H Ur: 195 ug/(24.h) (ref 131–612)
Normetanephrine, Ur: 195 ug/L

## 2023-04-16 ENCOUNTER — Ambulatory Visit
Admission: EM | Admit: 2023-04-16 | Discharge: 2023-04-16 | Disposition: A | Discharge status: Home / Self Care | Payer: PPO | Attending: Nurse Practitioner | Admitting: Nurse Practitioner

## 2023-04-16 DIAGNOSIS — Z8709 Personal history of other diseases of the respiratory system: Secondary | ICD-10-CM | POA: Diagnosis not present

## 2023-04-16 DIAGNOSIS — Z20828 Contact with and (suspected) exposure to other viral communicable diseases: Secondary | ICD-10-CM

## 2023-04-16 DIAGNOSIS — J069 Acute upper respiratory infection, unspecified: Secondary | ICD-10-CM

## 2023-04-16 LAB — POC COVID19/FLU A&B COMBO
Covid Antigen, POC: NEGATIVE
Influenza A Antigen, POC: NEGATIVE
Influenza B Antigen, POC: NEGATIVE

## 2023-04-16 MED ORDER — ALBUTEROL SULFATE (2.5 MG/3ML) 0.083% IN NEBU: 2.5000 mg | INHALATION_SOLUTION | Freq: Four times a day (QID) | RESPIRATORY_TRACT | 0 refills | Status: AC | PRN

## 2023-04-16 MED ORDER — MONTELUKAST SODIUM 10 MG PO TABS: 10.0000 mg | ORAL_TABLET | Freq: Every day | ORAL | 0 refills | Status: DC

## 2023-04-16 MED ORDER — OSELTAMIVIR PHOSPHATE 75 MG PO CAPS: 75.0000 mg | ORAL_CAPSULE | Freq: Two times a day (BID) | ORAL | 0 refills | Status: DC

## 2023-04-16 MED ORDER — PREDNISONE 20 MG PO TABS: 40.0000 mg | ORAL_TABLET | Freq: Every day | ORAL | 0 refills | Status: AC

## 2023-04-16 MED ORDER — PROMETHAZINE-DM 6.25-15 MG/5ML PO SYRP: 5.0000 mL | ORAL_SOLUTION | Freq: Four times a day (QID) | ORAL | 0 refills | Status: DC | PRN

## 2023-04-16 NOTE — ED Triage Notes (Addendum)
 Pt reports cough, congestion, chest tightness, denies fever x 1 day hx of asthma, has been using at home neb and albuterol  inhaler

## 2023-04-16 NOTE — ED Provider Notes (Signed)
 [facility]    CSN: [id number] Arrival date & time: 04/16/23  1135      History   Chief Complaint No chief complaint on file.   HPI Cassie Ross is a 63 y.o. female.   The history is provided by the patient.   Patient presents with a 1 day history of nasal congestion, cough, chest tightness, and sore throat.  Denies fever, chills, headache, ear pain, ear drainage, shortness of breath, difficulty breathing, chest pain, abdominal pain, nausea, vomiting, diarrhea, or rash.  Patient reports she does have underlying history of asthma.  States that she currently takes Breztri , and has Ventolin  for rescue.  She also states that she has a nebulizer machine at home.  States that she has not been using her nebulizer since symptoms started.  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    DVT (deep venous thrombosis) (HCC)    GERD (gastroesophageal reflux disease)    Hernia, hiatal 2007   HLD (hyperlipidemia)    Hypertension    Hypertension    Pneumonia    Seasonal allergies     Patient Active Problem List   Diagnosis Date Noted   Sepsis due to pneumonia (HCC) 02/12/2023   Dyslipidemia 02/12/2023   GERD without esophagitis 02/12/2023   Asthma with COPD (HCC) 02/12/2023   Hyperlipidemia associated with type 2 diabetes mellitus (HCC) 01/25/2023   Moderate persistent asthma with allergic rhinitis without complication 01/25/2023   Chronic obstructive pulmonary disease (HCC) 01/25/2023   Acute respiratory failure with hypoxia (HCC) 12/03/2020   Obesity, morbid (HCC) 12/03/2020   Uterine prolapse 03/21/2020   Cystocele, midline 03/21/2020   Rectocele 03/21/2020    Stress incontinence 03/21/2020   Urinary tract infection without hematuria 02/17/2020   Asthma exacerbation 12/02/2019   Sore throat 12/01/2019   Encounter for long-term (current) use of medications 04/14/2019   Encounter for screening mammogram for malignant neoplasm of breast 03/02/2019   Encounter for therapeutic drug level monitoring 03/02/2019   Gastroesophageal reflux disease without esophagitis 03/02/2019   Mastitis in female 02/21/2019   Severe persistent asthma without complication 01/23/2019   Encounter for general adult medical examination with abnormal findings 10/08/2018   Dysuria 10/08/2018   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   Family history of colon cancer 05/01/2018   Pulmonary nodule 05/01/2018   Neoplasm of uncertain behavior of skin of face 05/01/2018   Acute upper respiratory infection 01/22/2018   Generalized anxiety disorder 01/22/2018   Needs flu shot 12/07/2017   Uncontrolled type 2 diabetes mellitus with hyperglycemia (HCC) 06/08/2017   Atopic dermatitis 06/08/2017   Severe asthma without complication 06/08/2017   Hypokalemia 06/08/2017   Influenza A 04/22/2016   Essential hypertension 04/21/2016   Diabetes (HCC) 04/21/2016   GERD (gastroesophageal reflux disease) 04/21/2016   Pneumonia 02/26/2016   Severe persistent asthma with acute exacerbation 02/21/2016   Primary osteoarthritis of left knee 12/27/2014   DDD (degenerative disc disease), lumbar 09/28/2013   Lumbar radiculitis 09/28/2013    Past Surgical History:  Procedure Laterality Date   BLADDER SUSPENSION  N/A 05/23/2020   Procedure: TRANSVAGINAL TAPE (TVT) PROCEDURE;  Surgeon: Arloa Lamar SQUIBB, MD;  Location: [facility] ORS;  Service: Gynecology;  Laterality: N/A;   cataract surgery     CHOLECYSTECTOMY     COLONOSCOPY WITH PROPOFOL  N/A 01/06/2018   Procedure: COLONOSCOPY WITH PROPOFOL ;  Surgeon: Gaylyn Gladis PENNER, MD;  Location: [facility] ENDOSCOPY;  Service: Endoscopy;   Laterality: N/A;   CYSTOCELE REPAIR N/A 05/23/2020   Procedure: ANTERIOR REPAIR (CYSTOCELE);  Surgeon: Arloa Lamar SQUIBB, MD;  Location: [facility];  Service: Gynecology;  Laterality: N/A;   CYSTOSCOPY N/A 05/23/2020   Procedure: CYSTOSCOPY;  Surgeon: Arloa Lamar SQUIBB, MD;  Location: [facility];  Service: Gynecology;  Laterality: N/A;   ESOPHAGEAL DILATION     ESOPHAGOGASTRODUODENOSCOPY (EGD) WITH PROPOFOL  N/A 01/06/2018   Procedure: ESOPHAGOGASTRODUODENOSCOPY (EGD) WITH PROPOFOL ;  Surgeon: Gaylyn Gladis PENNER, MD;  Location: [facility]COPY;  Service: Endoscopy;  Laterality: N/A;   EYE SURGERY Bilateral 2014   cataract    HERNIA REPAIR     hiatial hernia repair     NASAL SINUS SURGERY     PUBOVAGINAL SLING N/A 05/23/2020   Procedure: CARLOYN GLADE;  Surgeon: Arloa Lamar SQUIBB, MD;  Location: [facility];  Service: Gynecology;  Laterality: N/A;   RECTOCELE REPAIR  05/23/2020   Procedure: POSTERIOR REPAIR (RECTOCELE);  Surgeon: Arloa Lamar SQUIBB, MD;  Location: [facility];  Service: Gynecology;;   VAGINAL HYSTERECTOMY Bilateral 05/23/2020   Procedure: HYSTERECTOMY VAGINAL BILATERIAL SALPINGO OOPHORECTOMY;  Surgeon: Arloa Lamar SQUIBB, MD;  Location: [facility];  Service: Gynecology;  Laterality: Bilateral;  LEFT fallopian tube, BILAT ovaries    OB History     Gravida  4   Para  3   Term  3   Preterm      AB  1   Living  3      SAB      IAB  1   Ectopic      Multiple      Live Births  3            Home Medications    Prior to Admission medications   Medication Sig Start Date End Date Taking? Authorizing Provider  acetaminophen  (TYLENOL ) 500 MG tablet Take 1,000 mg by mouth every 8 (eight) hours as needed for moderate pain.     [provider]  albuterol  (VENTOLIN  HFA) 108 (90 Base) MCG/ACT inhaler Inhale 2 puffs into the lungs every 6 (six) hours as needed for wheezing or shortness of breath.    [provider]  ALPRAZolam  (XANAX ) 0.5 MG tablet Take 1 tablet  (0.5 mg total) by mouth 2 (two) times daily as needed for anxiety. for anxiety. May take 1 extra tab for severe anxiety/panic daily as needed 02/25/23   Liana Fish, NP  aspirin  EC 81 MG tablet Take 81 mg by mouth daily.    [provider]  atorvastatin  (LIPITOR) 10 MG tablet Take 10 mg by mouth daily. 12/03/21   [provider]  Budeson-Glycopyrrol-Formoterol  (BREZTRI  AEROSPHERE) 160-9-4.8 MCG/ACT AERO INHALE 2 PUFFS TWICE DAILY 11/18/22   Liana Fish, NP  carboxymethylcellul-glycerin (LUBRICANT DROPS/DUAL-ACTION) 0.5-0.9 % ophthalmic solution Place 1-2 drops into both eyes 3 (three) times daily as needed for dry eyes.    [provider]  CARTIA  XT 180 MG 24 hr capsule Take 1 capsule by mouth twice daily 03/31/23   Abernathy, Alyssa, NP  diltiazem  (CARDIZEM ) 120 MG tablet Take 1 tablet (120 mg total) by mouth 2 (two) times daily as  needed (for when heart rate >120, but check blood pressure before taking, and only take if systolic BP >110.). 02/14/23   Awanda City, MD  fluticasone  (FLONASE ) 50 MCG/ACT nasal spray Place 1 spray into both nostrils in the morning and at bedtime.    [provider]  furosemide  (LASIX ) 40 MG tablet Take 1 tablet (40 mg total) by mouth 2 (two) times daily as needed (fluid retention.). 10/12/20   Abernathy, Alyssa, NP  Insulin  Pen Needle (RELION MINI PEN NEEDLES) 31G X 6 MM MISC Use as directed 3 times a day Dx E11.65 03/13/23   Liana Fish, NP  loratadine  (CLARITIN ) 10 MG tablet Take 10 mg by mouth daily.    [provider]  montelukast  (SINGULAIR ) 10 MG tablet TAKE 1 TABLET BY MOUTH AT BEDTIME 11/05/22   Abernathy, Alyssa, NP  Multiple Vitamin (MULTIVITAMIN WITH MINERALS) TABS tablet Take 1 tablet by mouth daily.    [provider]  nitroGLYCERIN  (NITROSTAT ) 0.4 MG SL tablet Place 1 tablet (0.4 mg total) under the tongue every 5 (five) minutes as needed for chest pain. X3 tablets then call 911 if no relief. 02/26/22    Liana Fish, NP  pantoprazole  (PROTONIX ) 40 MG tablet Take 1 tablet (40 mg total) by mouth 2 (two) times daily. 11/27/21   Liana Fish, NP  potassium chloride  (KLOR-CON  M) 10 MEQ tablet Take 1 tablet (10 mEq total) by mouth 2 (two) times daily as needed. With furosemide .  Home med. 02/14/23   Awanda City, MD  predniSONE  (STERAPRED UNI-PAK 21 TAB) 10 MG (21) TBPK tablet Use as directed for 6 days 03/17/23   Liana Fish, NP  theophylline  (UNIPHYL) 600 MG 24 hr tablet TAKE ONE TABLET BY MOUTH ONE TIME DAILY 02/11/23   Khan, Fozia M, MD    Family History Family History  Problem Relation Age of Onset   Hypertension Mother    Lung cancer Mother    Colon cancer Father    Colon polyps Sister    Diabetes Sister     Social History Social History   Tobacco Use   Smoking status: Never   Smokeless tobacco: Never  Vaping Use   Vaping status: Never Used  Substance Use Topics   Alcohol use: Yes    Comment: glass of wine rarely   Drug use: No     Allergies   Shellfish allergy, Sunflower oil, Levaquin  [levofloxacin  in d5w], Levaquin  [levofloxacin ], and Penicillins   Review of Systems Review of Systems Per HPI  Physical Exam Triage Vital Signs ED Triage Vitals  Encounter Vitals Group     BP 04/16/23 1150 (!) 159/89     Systolic BP Percentile --      Diastolic BP Percentile --      Pulse Rate 04/16/23 1150 60     Resp 04/16/23 1150 16     Temp 04/16/23 1150 98.2 F (36.8 C)     Temp Source 04/16/23 1150 Oral     SpO2 04/16/23 1150 96 %     Weight --      Height --      Head Circumference --      Peak Flow --      Pain Score 04/16/23 1152 0     Pain Loc --      Pain Education --      Exclude from Growth Chart --    No data found.  Updated Vital Signs BP (!) 159/89 (BP Location: Right Arm)   Pulse 60  Temp 98.2 F (36.8 C) (Oral)   Resp 16   SpO2 96%   Visual Acuity Right Eye Distance:   Left Eye Distance:   Bilateral Distance:    Right Eye Near:    Left Eye Near:    Bilateral Near:     Physical Exam Vitals and nursing note reviewed.  Constitutional:      General: She is not in acute distress.    Appearance: Normal appearance.  HENT:     Head: Normocephalic.     Right Ear: Tympanic membrane, ear canal and external ear normal.     Left Ear: Tympanic membrane, ear canal and external ear normal.     Nose: Congestion present.     Right Turbinates: Enlarged and swollen.     Left Turbinates: Enlarged and swollen.     Right Sinus: No maxillary sinus tenderness or frontal sinus tenderness.     Left Sinus: No maxillary sinus tenderness or frontal sinus tenderness.     Mouth/Throat:     Lips: Pink.     Mouth: Mucous membranes are moist.     Pharynx: Uvula midline. Posterior oropharyngeal erythema and postnasal drip present. No pharyngeal swelling, oropharyngeal exudate or uvula swelling.     Comments: Cobblestoning present to posterior oropharynx  Eyes:     Extraocular Movements: Extraocular movements intact.     Conjunctiva/sclera: Conjunctivae normal.     Pupils: Pupils are equal, round, and reactive to light.  Cardiovascular:     Rate and Rhythm: Normal rate and regular rhythm.     Pulses: Normal pulses.     Heart sounds: Normal heart sounds.  Pulmonary:     Effort: Pulmonary effort is normal. No respiratory distress.     Breath sounds: No stridor. Wheezing present. No rhonchi or rales.     Comments: Faint expiratory wheezing noted in all lung fields. Chest:     Chest wall: No tenderness.  Abdominal:     General: Bowel sounds are normal.     Palpations: Abdomen is soft.     Tenderness: There is no abdominal tenderness.  Musculoskeletal:     Cervical back: Normal range of motion.  Lymphadenopathy:     Cervical: No cervical adenopathy.  Skin:    General: Skin is warm.  Neurological:     General: No focal deficit present.     Mental Status: She is alert and oriented to person, place, and time.  Psychiatric:        Mood  and Affect: Mood normal.        Behavior: Behavior normal.      UC Treatments / Results  Labs (all labs ordered are listed, but only abnormal results are displayed) Labs Reviewed  POC COVID19/FLU A&B COMBO    EKG   Radiology No results found.  Procedures Procedures (including critical care time)  Medications Ordered in UC Medications - No data to display  Initial Impression / Assessment and Plan / UC Course  I have reviewed the triage vital signs and the nursing notes.  Pertinent labs & imaging results that were available during my care of the patient were reviewed by me and considered in my medical decision making (see chart for details).  On exam, patient with expiratory wheezing noted in all lung fields.  Room air sats are at 96%.  COVID/flu test was negative; patient with close exposure to influenza.  Given her underlying history of asthma, will treat for prophylaxis with Tamiflu  75 mg.  Symptomatic treatment provided  for her cough with Promethazine  DM.  For underlying history of asthma, albuterol  nebulizer solution prescribed along with Singulair  10 mg and Prednisone  40mg .  Supportive care recommendations were provided and discussed with the patient to include fluids, rest, over-the-counter analgesics, use of normal saline nasal spray.  Discussed indications with the patient regarding follow-up.  Patient was also given strict ER follow-up instructions.  Patient was in agreement with this plan of care and verbalizes understanding.  All questions were answered.  Patient stable for discharge.  Final Clinical Impressions(s) / UC Diagnoses   Final diagnoses:  None   Discharge Instructions   None    ED Prescriptions   None    PDMP not reviewed this encounter.   Gilmer Etta PARAS, NP 04/16/23 1227

## 2023-04-16 NOTE — Discharge Instructions (Addendum)
 The covid/flu test was negative, but I have decided to treat you due to the close exposure you have had. Take medication as prescribed. Increase fluids and allow for plenty of rest. Recommend Tylenol  or ibuprofen as needed for pain, fever, or general discomfort. Warm salt water gargles 3-4 times daily to help with throat pain or discomfort. Recommend using a humidifier at bedtime during sleep to help with cough and nasal congestion. Sleep elevated on 2 pillows. Follow-up if your symptoms do not improve.

## 2023-04-23 ENCOUNTER — Other Ambulatory Visit: Payer: Self-pay | Admitting: Nurse Practitioner

## 2023-04-23 DIAGNOSIS — J4540 Moderate persistent asthma, uncomplicated: Secondary | ICD-10-CM

## 2023-04-29 DIAGNOSIS — I10 Essential (primary) hypertension: Secondary | ICD-10-CM | POA: Diagnosis not present

## 2023-04-29 DIAGNOSIS — E7849 Other hyperlipidemia: Secondary | ICD-10-CM | POA: Diagnosis not present

## 2023-04-29 DIAGNOSIS — R9431 Abnormal electrocardiogram [ECG] [EKG]: Secondary | ICD-10-CM | POA: Diagnosis not present

## 2023-04-29 DIAGNOSIS — I479 Paroxysmal tachycardia, unspecified: Secondary | ICD-10-CM | POA: Diagnosis not present

## 2023-04-29 DIAGNOSIS — Z8679 Personal history of other diseases of the circulatory system: Secondary | ICD-10-CM | POA: Diagnosis not present

## 2023-04-29 DIAGNOSIS — R55 Syncope and collapse: Secondary | ICD-10-CM | POA: Diagnosis not present

## 2023-04-29 DIAGNOSIS — R600 Localized edema: Secondary | ICD-10-CM | POA: Diagnosis not present

## 2023-04-29 DIAGNOSIS — R002 Palpitations: Secondary | ICD-10-CM | POA: Diagnosis not present

## 2023-05-04 ENCOUNTER — Other Ambulatory Visit: Payer: Self-pay | Admitting: Nurse Practitioner

## 2023-05-05 ENCOUNTER — Ambulatory Visit: Payer: Self-pay

## 2023-05-05 NOTE — Patient Instructions (Signed)
 Visit Information  Thank you for taking time to visit with me today. Please don't hesitate to contact me if I can be of assistance to you.   Following are the goals we discussed today:   Goals Addressed             This Visit's Progress    Post hospital admission follow up and management of health conditions.       Interventions Today    Flowsheet Row Most Recent Value  Chronic Disease   Chronic disease during today's visit Hypertension (HTN), Other  [paroximal sinus tachycardia, asthma)]  General Interventions   General Interventions Discussed/Reviewed General Interventions Reviewed, Doctor Visits  [evaluation of current treatment plan for listed health conditions and patients adherence to plan as established by provider. Assessed for bp readings and tachycardia/ asthma symptoms.]  Doctor Visits Discussed/Reviewed Doctor Visits Reviewed  Annabell Sabal upcoming provider visits. Advised to keep follow up visits as recommended.]  Education Interventions   Education Provided Provided Education  [Advised to continue monitoring blood pressure 3-4 times per week and also assess pulse. Advised to notify provider for readings outside of established parameter.]  Provided Verbal Education On Other  [Advised to contact cardiology office to inquire if heart monitor to be worn at time of echocardiogram.]  Pharmacy Interventions   Pharmacy Dicussed/Reviewed Pharmacy Topics Reviewed  [medications reviewed.  Advised to take medications as prescribed.  Advised to contact provider office or nurse case manager for medication concerns.]              Our next appointment is by telephone on 05/09/23 at 10:45 am  Please call the care guide team at [phone] if you need to cancel or reschedule your appointment.   If you are experiencing a Mental Health or Behavioral Health Crisis or need someone to talk to, please call the Suicide and Crisis Lifeline: 988 call 1-800-273-TALK (toll free, 24 hour  hotline)  Patient verbalizes understanding of instructions and care plan provided today and agrees to view in MyChart. Active MyChart status and patient understanding of how to access instructions and care plan via MyChart confirmed with patient.     George Ina RN, BSN, CCM [employer], Population Health Case Manager Phone: [phone]

## 2023-05-05 NOTE — Patient Outreach (Signed)
 Care Coordination   Follow Up Visit Note   05/05/2023 Name: Cassie Ross MRN: [id number] DOB: 26-Apr-1960  [name] is a 63 y.o. year old female who sees Sallyanne Kuster, NP for primary care. I spoke with  [name] by phone today.  What matters to the patients health and wellness today?  Patient states she is recovering and feeling better after having recent virus.  Patient reports having new cardiology visit on 04/29/23.  She states she was ordered a heart monitor to wear for 2 weeks and is scheduled to have an echocardiogram on 3 /3/25.  Patient states she is concerned that the heart monitor is not scheduled to come off until 05/13/23 however she is scheduled to have an echocardiogram on 05/12/23.  Patient reports her blood pressure readings are ranging in the 120's/80-90's and pulse in 80-90's.  Patient states her asthma is better since she is now recovering from the virus. She states she is not taking the breathing treatments and uses her emergency inhaler 2-3 x per week.  Patient denies any tachycardia events over the last 2 weeks. She states her next follow up visit with her provider is 06/04/23 and with the cardiologist on 07/02/23.    Goals Addressed             This Visit's Progress    Post hospital admission follow up and management of health conditions.       Interventions Today    Flowsheet Row Most Recent Value  Chronic Disease   Chronic disease during today's visit Hypertension (HTN), Other  [paroximal sinus tachycardia, asthma)]  General Interventions   General Interventions Discussed/Reviewed General Interventions Reviewed, Doctor Visits  [evaluation of current treatment plan for listed health conditions and patients adherence to plan as established by provider. Assessed for bp readings and tachycardia/ asthma symptoms.]  Doctor Visits Discussed/Reviewed Doctor Visits Reviewed  Annabell Sabal upcoming provider visits. Advised to keep follow up visits as recommended.]   Education Interventions   Education Provided Provided Education  [Advised to continue monitoring blood pressure 3-4 times per week and also assess pulse. Advised to notify provider for readings outside of established parameter.]  Provided Verbal Education On Other  [Advised to contact cardiology office to inquire if heart monitor to be worn at time of echocardiogram.]  Pharmacy Interventions   Pharmacy Dicussed/Reviewed Pharmacy Topics Reviewed  [medications reviewed.  Advised to take medications as prescribed.  Advised to contact provider office or nurse case manager for medication concerns.]              SDOH assessments and interventions completed:  No     Care Coordination Interventions:  Yes, provided   Follow up plan: Follow up call scheduled for 06/06/23 at 10:45 am.     Encounter Outcome:  Patient Visit Completed   George Ina RN, BSN, CCM [place]  [facility], Population Health Case Manager Phone: [phone]

## 2023-05-12 DIAGNOSIS — I361 Nonrheumatic tricuspid (valve) insufficiency: Secondary | ICD-10-CM | POA: Diagnosis not present

## 2023-05-12 DIAGNOSIS — I3481 Nonrheumatic mitral (valve) annulus calcification: Secondary | ICD-10-CM | POA: Diagnosis not present

## 2023-05-12 DIAGNOSIS — I358 Other nonrheumatic aortic valve disorders: Secondary | ICD-10-CM | POA: Diagnosis not present

## 2023-05-19 DIAGNOSIS — R008 Other abnormalities of heart beat: Secondary | ICD-10-CM | POA: Diagnosis not present

## 2023-05-19 DIAGNOSIS — R55 Syncope and collapse: Secondary | ICD-10-CM | POA: Diagnosis not present

## 2023-05-19 DIAGNOSIS — I471 Supraventricular tachycardia, unspecified: Secondary | ICD-10-CM | POA: Diagnosis not present

## 2023-05-19 DIAGNOSIS — R002 Palpitations: Secondary | ICD-10-CM | POA: Diagnosis not present

## 2023-06-03 DIAGNOSIS — L921 Necrobiosis lipoidica, not elsewhere classified: Secondary | ICD-10-CM | POA: Diagnosis not present

## 2023-06-03 DIAGNOSIS — L820 Inflamed seborrheic keratosis: Secondary | ICD-10-CM | POA: Diagnosis not present

## 2023-06-03 DIAGNOSIS — L570 Actinic keratosis: Secondary | ICD-10-CM | POA: Diagnosis not present

## 2023-06-03 DIAGNOSIS — L538 Other specified erythematous conditions: Secondary | ICD-10-CM | POA: Diagnosis not present

## 2023-06-03 DIAGNOSIS — D2339 Other benign neoplasm of skin of other parts of face: Secondary | ICD-10-CM | POA: Diagnosis not present

## 2023-06-04 ENCOUNTER — Encounter: Payer: Self-pay | Admitting: Nurse Practitioner

## 2023-06-04 ENCOUNTER — Ambulatory Visit (INDEPENDENT_AMBULATORY_CARE_PROVIDER_SITE_OTHER): Payer: PPO | Admitting: Nurse Practitioner

## 2023-06-04 VITALS — BP 138/88 | HR 72 | Temp 98.3°F | Resp 16 | Ht 63.0 in | Wt 189.8 lb

## 2023-06-04 DIAGNOSIS — J4540 Moderate persistent asthma, uncomplicated: Secondary | ICD-10-CM | POA: Diagnosis not present

## 2023-06-04 DIAGNOSIS — I10 Essential (primary) hypertension: Secondary | ICD-10-CM

## 2023-06-04 DIAGNOSIS — E785 Hyperlipidemia, unspecified: Secondary | ICD-10-CM

## 2023-06-04 DIAGNOSIS — F411 Generalized anxiety disorder: Secondary | ICD-10-CM | POA: Diagnosis not present

## 2023-06-04 DIAGNOSIS — K219 Gastro-esophageal reflux disease without esophagitis: Secondary | ICD-10-CM | POA: Diagnosis not present

## 2023-06-04 DIAGNOSIS — E1169 Type 2 diabetes mellitus with other specified complication: Secondary | ICD-10-CM | POA: Diagnosis not present

## 2023-06-04 DIAGNOSIS — Z760 Encounter for issue of repeat prescription: Secondary | ICD-10-CM

## 2023-06-04 DIAGNOSIS — N390 Urinary tract infection, site not specified: Secondary | ICD-10-CM

## 2023-06-04 DIAGNOSIS — I152 Hypertension secondary to endocrine disorders: Secondary | ICD-10-CM

## 2023-06-04 DIAGNOSIS — E1159 Type 2 diabetes mellitus with other circulatory complications: Secondary | ICD-10-CM | POA: Diagnosis not present

## 2023-06-04 LAB — POCT URINALYSIS DIPSTICK
Bilirubin, UA: NEGATIVE
Glucose, UA: NEGATIVE
Ketones, UA: NEGATIVE
Leukocytes, UA: NEGATIVE
Nitrite, UA: NEGATIVE
Protein, UA: NEGATIVE
Spec Grav, UA: 1.02 (ref 1.010–1.025)
Urobilinogen, UA: 0.2 U/dL — AB
pH, UA: 5 (ref 5.0–8.0)

## 2023-06-04 LAB — POCT GLYCOSYLATED HEMOGLOBIN (HGB A1C): Hemoglobin A1C: 7.1 % — AB (ref 4.0–5.6)

## 2023-06-04 MED ORDER — DILTIAZEM HCL ER COATED BEADS 180 MG PO CP24: 180.0000 mg | ORAL_CAPSULE | Freq: Two times a day (BID) | ORAL | 3 refills | Status: AC

## 2023-06-04 MED ORDER — ATORVASTATIN CALCIUM 10 MG PO TABS: 10.0000 mg | ORAL_TABLET | Freq: Every day | ORAL | 3 refills | Status: AC

## 2023-06-04 MED ORDER — PANTOPRAZOLE SODIUM 40 MG PO TBEC: 40.0000 mg | DELAYED_RELEASE_TABLET | Freq: Two times a day (BID) | ORAL | 3 refills | Status: AC

## 2023-06-04 MED ORDER — MONTELUKAST SODIUM 10 MG PO TABS: 10.0000 mg | ORAL_TABLET | Freq: Every day | ORAL | 3 refills | Status: AC

## 2023-06-04 MED ORDER — ALPRAZOLAM 0.5 MG PO TABS: 0.5000 mg | ORAL_TABLET | Freq: Two times a day (BID) | ORAL | 2 refills | Status: DC | PRN

## 2023-06-04 NOTE — Progress Notes (Signed)
 [facility] [address] [place], [place] [address]  Internal MEDICINE  Office Visit Note  Patient Name: Cassie Ross  [id number]  [id number]  Date of Service: 06/04/2023  Chief Complaint  Patient presents with   Diabetes   Gastroesophageal Reflux   Hyperlipidemia   Hypertension   Follow-up    HPI Cassie Ross presents for a follow-up visit for diabetes, recent infection, hypertension, high cholesterol, asthma, anxiety and refills. Diabetes -- A1c is elevated at 7.1 but patient was sick recently and had a course of prednisone .  UTI on December ER visit -- was not treated in ER. Not having symptoms at the present time.  Asthma -- taking breztri  and montelukast  Anxiety -- takes prn alprazolam  and due for refills. High cholesterol -- on statin therapy Hypertension -- controlled with current medications     Current Medication: Outpatient Encounter Medications as of 06/04/2023  Medication Sig   acetaminophen  (TYLENOL ) 500 MG tablet Take 1,000 mg by mouth every 8 (eight) hours as needed for moderate pain.    albuterol  (PROVENTIL ) (2.5 MG/3ML) 0.083% nebulizer solution Take 3 mLs (2.5 mg total) by nebulization every 6 (six) hours as needed for wheezing or shortness of breath.   aspirin  EC 81 MG tablet Take 81 mg by mouth daily.   Budeson-Glycopyrrol-Formoterol  (BREZTRI  AEROSPHERE) 160-9-4.8 MCG/ACT AERO INHALE 2 PUFFS TWICE DAILY   carboxymethylcellul-glycerin (LUBRICANT DROPS/DUAL-ACTION) 0.5-0.9 % ophthalmic solution Place 1-2 drops into both eyes 3 (three) times daily as needed for dry eyes.   fluticasone  (FLONASE ) 50 MCG/ACT nasal spray Place 1 spray into both nostrils in the morning and at bedtime.   furosemide  (LASIX ) 40 MG tablet Take 1 tablet (40 mg total) by mouth 2 (two) times daily as needed (fluid retention.).   Insulin  Pen Needle (RELION MINI PEN NEEDLES) 31G X 6 MM MISC Use as directed 3 times a day Dx E11.65   loratadine  (CLARITIN ) 10 MG tablet Take 10 mg by  mouth daily.   Multiple Vitamin (MULTIVITAMIN WITH MINERALS) TABS tablet Take 1 tablet by mouth daily.   nitroGLYCERIN  (NITROSTAT ) 0.4 MG SL tablet Place 1 tablet (0.4 mg total) under the tongue every 5 (five) minutes as needed for chest pain. X3 tablets then call 911 if no relief.   potassium chloride  (KLOR-CON  M) 10 MEQ tablet Take 1 tablet (10 mEq total) by mouth 2 (two) times daily as needed. With furosemide .  Home med.   promethazine -dextromethorphan  (PROMETHAZINE -DM) 6.25-15 MG/5ML syrup Take 5 mLs by mouth 4 (four) times daily as needed.   theophylline  (UNIPHYL) 600 MG 24 hr tablet TAKE ONE TABLET BY MOUTH ONE TIME DAILY   [DISCONTINUED] ALPRAZolam  (XANAX ) 0.5 MG tablet Take 1 tablet (0.5 mg total) by mouth 2 (two) times daily as needed for anxiety. for anxiety. May take 1 extra tab for severe anxiety/panic daily as needed   [DISCONTINUED] atorvastatin  (LIPITOR) 10 MG tablet Take 10 mg by mouth daily.   [DISCONTINUED] CARTIA  XT 180 MG 24 hr capsule Take 1 capsule by mouth twice daily   [DISCONTINUED] diltiazem  (CARDIZEM ) 120 MG tablet Take 1 tablet (120 mg total) by mouth 2 (two) times daily as needed (for when heart rate >120, but check blood pressure before taking, and only take if systolic BP >110.).   [DISCONTINUED] montelukast  (SINGULAIR ) 10 MG tablet TAKE 1 TABLET BY MOUTH AT BEDTIME   [DISCONTINUED] oseltamivir  (TAMIFLU ) 75 MG capsule Take 1 capsule (75 mg total) by mouth every 12 (twelve) hours.   [DISCONTINUED] pantoprazole  (PROTONIX ) 40 MG tablet  Take 1 tablet (40 mg total) by mouth 2 (two) times daily.   ALPRAZolam  (XANAX ) 0.5 MG tablet Take 1 tablet (0.5 mg total) by mouth 2 (two) times daily as needed for anxiety or sleep. for anxiety. May take 1 extra tab for severe anxiety/panic daily as needed   atorvastatin  (LIPITOR) 10 MG tablet Take 1 tablet (10 mg total) by mouth daily.   diltiazem  (CARTIA  XT) 180 MG 24 hr capsule Take 1 capsule (180 mg total) by mouth 2 (two) times daily.    montelukast  (SINGULAIR ) 10 MG tablet Take 1 tablet (10 mg total) by mouth at bedtime.   pantoprazole  (PROTONIX ) 40 MG tablet Take 1 tablet (40 mg total) by mouth 2 (two) times daily.   No facility-administered encounter medications on file as of 06/04/2023.    Surgical History: Past Surgical History:  Procedure Laterality Date   BLADDER SUSPENSION N/A 05/23/2020   Procedure: TRANSVAGINAL TAPE (TVT) PROCEDURE;  Surgeon: Alben Alma, MD;  Location: [facility] ORS;  Service: Gynecology;  Laterality: N/A;   cataract surgery     CHOLECYSTECTOMY     COLONOSCOPY WITH PROPOFOL  N/A 01/06/2018   Procedure: COLONOSCOPY WITH PROPOFOL ;  Surgeon: Deveron Fly, MD;  Location: [facility] ENDOSCOPY;  Service: Endoscopy;  Laterality: N/A;   CYSTOCELE REPAIR N/A 05/23/2020   Procedure: ANTERIOR REPAIR (CYSTOCELE);  Surgeon: Alben Alma, MD;  Location: [facility] ORS;  Service: Gynecology;  Laterality: N/A;   CYSTOSCOPY N/A 05/23/2020   Procedure: CYSTOSCOPY;  Surgeon: Alben Alma, MD;  Location: [facility] ORS;  Service: Gynecology;  Laterality: N/A;   ESOPHAGEAL DILATION     ESOPHAGOGASTRODUODENOSCOPY (EGD) WITH PROPOFOL  N/A 01/06/2018   Procedure: ESOPHAGOGASTRODUODENOSCOPY (EGD) WITH PROPOFOL ;  Surgeon: Deveron Fly, MD;  Location: [facility] ENDOSCOPY;  Service: Endoscopy;  Laterality: N/A;   EYE SURGERY Bilateral 2014   cataract    HERNIA REPAIR     hiatial hernia repair     NASAL SINUS SURGERY     PUBOVAGINAL SLING N/A 05/23/2020   Procedure: Gino Lais;  Surgeon: Alben Alma, MD;  Location: [facility] ORS;  Service: Gynecology;  Laterality: N/A;   RECTOCELE REPAIR  05/23/2020   Procedure: POSTERIOR REPAIR (RECTOCELE);  Surgeon: Alben Alma, MD;  Location: [facility] ORS;  Service: Gynecology;;   VAGINAL HYSTERECTOMY Bilateral 05/23/2020   Procedure: HYSTERECTOMY VAGINAL BILATERIAL SALPINGO OOPHORECTOMY;  Surgeon: Alben Alma, MD;  Location: [facility] ORS;  Service: Gynecology;  Laterality:  Bilateral;  LEFT fallopian tube, BILAT ovaries    Medical History: 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    DVT (deep venous thrombosis) (HCC)    GERD (gastroesophageal reflux disease)    Hernia, hiatal 2007   HLD (hyperlipidemia)    Hypertension    Hypertension    Pneumonia    Seasonal allergies     Family History: Family History  Problem Relation Age of Onset   Hypertension Mother    Lung cancer Mother    Colon cancer Father    Colon polyps Sister    Diabetes Sister     Social History  Socioeconomic History   Marital status: Married    Spouse name: Not on file   Number of children: Not on file   Years of education: Not on file   Highest education level: Not on file  Occupational History   Not on file  Tobacco Use   Smoking status: Never   Smokeless tobacco: Never  Vaping Use   Vaping status: Never Used  Substance and Sexual Activity   Alcohol use: Yes    Comment: glass of wine rarely   Drug use: No   Sexual activity: Not on file  Other Topics Concern   Not on file  Social History Narrative   Lives at home with family. Independent   Social Drivers of Corporate investment banker Strain: Low Risk  (12/03/2022)   Overall Financial Resource Strain (CARDIA)    Difficulty of Paying Living Expenses: Not hard at all  Food Insecurity: No Food Insecurity (02/21/2023)   Hunger Vital Sign    Worried About Running Out of Food in the Last Year: Never true    Ran Out of Food in the Last Year: Never true  Transportation Needs: No Transportation Needs (02/21/2023)   PRAPARE - Administrator, Civil Service (Medical): No    Lack of  Transportation (Non-Medical): No  Physical Activity: Insufficiently Active (12/03/2022)   Exercise Vital Sign    Days of Exercise per Week: 3 days    Minutes of Exercise per Session: 20 min  Stress: Stress Concern Present (12/03/2022)   [employer] of Occupational Health - Occupational Stress Questionnaire    Feeling of Stress : To some extent  Social Connections: Moderately Integrated (12/03/2022)   Social Connection and Isolation Panel [NHANES]    Frequency of Communication with Friends and Family: More than three times a week    Frequency of Social Gatherings with Friends and Family: Once a week    Attends Religious Services: More than 4 times per year    Active Member of [employer] or Organizations: No    Attends Banker Meetings: Never    Marital Status: Married  Catering manager Violence: Not At Risk (02/21/2023)   Humiliation, Afraid, Rape, and Kick questionnaire    Fear of Current or Ex-Partner: No    Emotionally Abused: No    Physically Abused: No    Sexually Abused: No      Review of Systems  Constitutional:  Negative for chills, fatigue and unexpected weight change.  HENT:  Negative for congestion, ear pain, postnasal drip, rhinorrhea, sneezing, sore throat and trouble swallowing.   Eyes:  Negative for redness.  Respiratory:  Positive for cough (intermittent). Negative for chest tightness, shortness of breath and wheezing.   Cardiovascular: Negative.  Negative for chest pain and palpitations.  Gastrointestinal: Negative.  Negative for abdominal pain, constipation, diarrhea, nausea and vomiting.  Genitourinary:  Positive for dysuria, frequency and urgency.  Musculoskeletal:  Negative for arthralgias, back pain, joint swelling and neck pain.  Skin:  Negative for rash.  Neurological: Negative.  Negative for tremors and numbness.  Hematological:  Negative for adenopathy. Does not bruise/bleed easily.  Psychiatric/Behavioral:  Negative for behavioral problems  (Depression), self-injury, sleep disturbance and suicidal ideas. The patient is nervous/anxious.     Vital Signs: BP 138/88   Pulse 72   Temp 98.3 F (36.8 C)   Resp 16   Ht 5\' 3"  (1.6 m)   Wt 189 lb 12.8 oz (86.1 kg)   SpO2 93%   BMI  33.62 kg/m    Physical Exam Vitals reviewed.  Constitutional:      General: She is not in acute distress.    Appearance: Normal appearance. She is obese. She is not ill-appearing.  HENT:     Head: Normocephalic and atraumatic.  Eyes:     Pupils: Pupils are equal, round, and reactive to light.  Cardiovascular:     Rate and Rhythm: Normal rate and regular rhythm.  Pulmonary:     Effort: Pulmonary effort is normal. No respiratory distress.  Neurological:     Mental Status: She is alert and oriented to person, place, and time.  Psychiatric:        Mood and Affect: Mood normal.        Behavior: Behavior normal.        Assessment/Plan: 1. Type 2 diabetes mellitus with other specified complication, without long-term current use of insulin  (HCC) (Primary) A1c is slightly increased at 7.1, most likely due to course of prednisone . Continue medications as prescribed and diabetic diet.  - POCT glycosylated hemoglobin (Hb A1C)  2. Hypertension associated with type 2 diabetes mellitus (HCC) Stable, Continue diltiazem  as prescribed - diltiazem  (CARTIA  XT) 180 MG 24 hr capsule; Take 1 capsule (180 mg total) by mouth 2 (two) times daily.  Dispense: 180 capsule; Refill: 3  3. Hyperlipidemia associated with type 2 diabetes mellitus (HCC) Continue atorvastatin  as prescribed.  - atorvastatin  (LIPITOR) 10 MG tablet; Take 1 tablet (10 mg total) by mouth daily.  Dispense: 90 tablet; Refill: 3  4. Moderate persistent asthma, uncomplicated Continue breztri  inhaler and montelukast  as prescribed.  - montelukast  (SINGULAIR ) 10 MG tablet; Take 1 tablet (10 mg total) by mouth at bedtime.  Dispense: 90 tablet; Refill: 3  5. Gastroesophageal reflux disease  without esophagitis Continue pantoprazole  as prescribed.  - pantoprazole  (PROTONIX ) 40 MG tablet; Take 1 tablet (40 mg total) by mouth 2 (two) times daily.  Dispense: 180 tablet; Refill: 3  6. Urinary tract infection without hematuria, site unspecified Repeat urinalysis and urine culture, if positive culture will prescribe antibiotics.  - POCT Urinalysis Dipstick - CULTURE, URINE COMPREHENSIVE  7. Generalized anxiety disorder Continue prn alprazolam  as prescribed, follow up in 3 months for additional refills.  - ALPRAZolam  (XANAX ) 0.5 MG tablet; Take 1 tablet (0.5 mg total) by mouth 2 (two) times daily as needed for anxiety or sleep. for anxiety. May take 1 extra tab for severe anxiety/panic daily as needed  Dispense: 90 tablet; Refill: 2   General Counseling: Kimberlie verbalizes understanding of the findings of todays visit and agrees with plan of treatment. I have discussed any further diagnostic evaluation that may be needed or ordered today. We also reviewed her medications today. she has been encouraged to call the office with any questions or concerns that should arise related to todays visit.    Orders Placed This Encounter  Procedures   CULTURE, URINE COMPREHENSIVE   POCT glycosylated hemoglobin (Hb A1C)   POCT Urinalysis Dipstick    Meds ordered this encounter  Medications   ALPRAZolam  (XANAX ) 0.5 MG tablet    Sig: Take 1 tablet (0.5 mg total) by mouth 2 (two) times daily as needed for anxiety or sleep. for anxiety. May take 1 extra tab for severe anxiety/panic daily as needed    Dispense:  90 tablet    Refill:  2    For future refills   atorvastatin  (LIPITOR) 10 MG tablet    Sig: Take 1 tablet (10 mg total) by mouth daily.  Dispense:  90 tablet    Refill:  3    For future refills, keep on file   diltiazem  (CARTIA  XT) 180 MG 24 hr capsule    Sig: Take 1 capsule (180 mg total) by mouth 2 (two) times daily.    Dispense:  180 capsule    Refill:  3   montelukast   (SINGULAIR ) 10 MG tablet    Sig: Take 1 tablet (10 mg total) by mouth at bedtime.    Dispense:  90 tablet    Refill:  3   pantoprazole  (PROTONIX ) 40 MG tablet    Sig: Take 1 tablet (40 mg total) by mouth 2 (two) times daily.    Dispense:  180 tablet    Refill:  3    For future refills    Return in about 4 months (around 10/07/2023) for F/U, Recheck A1C, Calandria Mullings PCP.   Total time spent:30 Minutes Time spent includes review of chart, medications, test results, and follow up plan with the patient.   [place] Controlled Substance Database was reviewed by me.  This patient was seen by Laurence Pons, FNP-C in collaboration with Dr. Verneta Gone as a part of collaborative care agreement.   Hassel Uphoff R. Bobbi Burow, MSN, FNP-C Internal medicine

## 2023-06-06 ENCOUNTER — Ambulatory Visit: Payer: Self-pay

## 2023-06-06 NOTE — Patient Instructions (Signed)
 Visit Information  Thank you for taking time to visit with me today. Your care coordination/ case management goals have been met.   What we discussed today:   Goals Addressed             This Visit's Progress    COMPLETED: Post hospital admission follow up and management of health conditions.       Interventions Today    Flowsheet Row Most Recent Value  Chronic Disease   Chronic disease during today's visit Hypertension (HTN), Other  [sinus tachycardia, asthma]  General Interventions   General Interventions Discussed/Reviewed General Interventions Reviewed, Doctor Visits  [evaluation of current treatment plan for listed health conditions and patients adherence to plan as established by provider. Assessed for blood pressure readings, tachycardia/ asthma symptoms.]  Doctor Visits Discussed/Reviewed Doctor Visits Reviewed  [reviewed upcoming provider visits. Advised to keep follow up appointments with providers as recommended.]  Education Interventions   Education Provided Provided Education  [Advised ongoing monitoring of blood pressure and pulse.  Advised to follow up with cardiologist regarding frequent tachycardia episodes and follow up with primary care provider or cardiologist for blood pressure readings outside of established parameters]  Provided Verbal Education On Other  [Advised to contact primary care provider office if care management services are needed in the future.]  Pharmacy Interventions   Pharmacy Dicussed/Reviewed Pharmacy Topics Reviewed  [medication review completed. Advised to take medications as prescribed and refill prescriptions timely.]              Please contact your primary care provider if case management services are needed in the future.   If you are experiencing a Mental Health or Behavioral Health Crisis or need someone to talk to, please call the Suicide and Crisis Lifeline: 988 call 1-800-273-TALK (toll free, 24 hour hotline)  Patient  verbalizes understanding of instructions and care plan provided today and agrees to view in MyChart. Active MyChart status and patient understanding of how to access instructions and care plan via MyChart confirmed with patient.     George Ina RN, BSN, CCM [employer], Population Health Case Manager Phone: [phone]

## 2023-06-06 NOTE — Patient Outreach (Signed)
 Care Coordination   Follow Up Visit Note   06/06/2023 Name: Cassie Ross MRN: [id number] DOB: 1960/11/12  Cassie Ross is a 63 y.o. year old female who sees Sallyanne Kuster, NP for primary care. I spoke with  Cassie Ross by phone today.  What matters to the patients health and wellness today?  Patient states she is feeling pretty good today. She states she finally has recovered fully from virus she had. Patient reports her blood pressures are ranging in the 120's/80's.  She states her echocardiogram and heart monitor results came back ok. She states her cardiologist advised her to contact their office if she notices more frequent episodes of tachycardia and he may consider putting her on medication.  Patient denies any increase symptoms with her asthma.  Patient states she understands when to follow up with her provider regarding her blood pressure, pulse and asthma conditions.  Patient denies having any further needs or concerns at this time. Declines any needed educational material regarding her health conditions. She states she is agreeable that her care coordination/ case management goals have been met.    Goals Addressed             This Visit's Progress    COMPLETED: Post hospital admission follow up and management of health conditions.       Interventions Today    Flowsheet Row Most Recent Value  Chronic Disease   Chronic disease during today's visit Hypertension (HTN), Other  [sinus tachycardia, asthma]  General Interventions   General Interventions Discussed/Reviewed General Interventions Reviewed, Doctor Visits  [evaluation of current treatment plan for listed health conditions and patients adherence to plan as established by provider. Assessed for blood pressure readings, tachycardia/ asthma symptoms.]  Doctor Visits Discussed/Reviewed Doctor Visits Reviewed  [reviewed upcoming provider visits. Advised to keep follow up appointments with providers as recommended.]   Education Interventions   Education Provided Provided Education  [Advised ongoing monitoring of blood pressure and pulse.  Advised to follow up with cardiologist regarding frequent tachycardia episodes and follow up with primary care provider or cardiologist for blood pressure readings outside of established parameters]  Provided Verbal Education On Other  [Advised to contact primary care provider office if care management services are needed in the future.]  Pharmacy Interventions   Pharmacy Dicussed/Reviewed Pharmacy Topics Reviewed  [medication review completed. Advised to take medications as prescribed and refill prescriptions timely.]              SDOH assessments and interventions completed:  No     Care Coordination Interventions:  Yes, provided   Follow up plan: No further intervention required.   Encounter Outcome:  Patient Visit Completed   George Ina RN, BSN, CCM [place]  [facility], Population Health Case Manager Phone: [phone]

## 2023-06-08 LAB — CULTURE, URINE COMPREHENSIVE

## 2023-06-09 ENCOUNTER — Other Ambulatory Visit: Payer: Self-pay

## 2023-06-09 ENCOUNTER — Telehealth: Payer: Self-pay

## 2023-06-09 MED ORDER — AZITHROMYCIN 250 MG PO TABS: ORAL_TABLET | ORAL | 0 refills | Status: AC

## 2023-06-09 NOTE — Telephone Encounter (Signed)
Left patient a message to give office a callback.Cassie Ross

## 2023-06-09 NOTE — Telephone Encounter (Signed)
 Spoke with Lauren and with it being a different strain and patient can't have penicillins she think sending Zithromax in will work so patient was notified of that and medicine was send in for her.

## 2023-07-25 ENCOUNTER — Encounter: Payer: Self-pay | Admitting: Nurse Practitioner

## 2023-10-06 ENCOUNTER — Encounter: Payer: Self-pay | Admitting: Nurse Practitioner

## 2023-10-06 ENCOUNTER — Ambulatory Visit (INDEPENDENT_AMBULATORY_CARE_PROVIDER_SITE_OTHER): Admitting: Nurse Practitioner

## 2023-10-06 VITALS — BP 135/80 | HR 94 | Temp 98.1°F | Resp 16 | Ht 63.0 in | Wt 194.0 lb

## 2023-10-06 DIAGNOSIS — Z87898 Personal history of other specified conditions: Secondary | ICD-10-CM

## 2023-10-06 DIAGNOSIS — E1169 Type 2 diabetes mellitus with other specified complication: Secondary | ICD-10-CM

## 2023-10-06 DIAGNOSIS — J42 Unspecified chronic bronchitis: Secondary | ICD-10-CM | POA: Diagnosis not present

## 2023-10-06 DIAGNOSIS — E1165 Type 2 diabetes mellitus with hyperglycemia: Secondary | ICD-10-CM | POA: Diagnosis not present

## 2023-10-06 DIAGNOSIS — F411 Generalized anxiety disorder: Secondary | ICD-10-CM

## 2023-10-06 LAB — POCT GLYCOSYLATED HEMOGLOBIN (HGB A1C): Hemoglobin A1C: 7.2 % — AB (ref 4.0–5.6)

## 2023-10-06 MED ORDER — ALPRAZOLAM 0.5 MG PO TABS: 0.5000 mg | ORAL_TABLET | Freq: Two times a day (BID) | ORAL | 2 refills | Status: DC | PRN

## 2023-10-06 MED ORDER — ONETOUCH VERIO VI STRP: ORAL_STRIP | 3 refills | Status: DC

## 2023-10-06 MED ORDER — THEOPHYLLINE ER 600 MG PO TB24: 600.0000 mg | ORAL_TABLET | Freq: Every day | ORAL | 1 refills | Status: DC

## 2023-10-06 MED ORDER — NITROGLYCERIN 0.4 MG SL SUBL: 0.4000 mg | SUBLINGUAL_TABLET | SUBLINGUAL | 3 refills | Status: AC | PRN

## 2023-10-06 NOTE — Progress Notes (Signed)
 [facility] [address] [place], [place] [address]  Internal MEDICINE  Office Visit Note  Patient Name: Cassie Ross  [id number]  [id number]  Date of Service: 10/06/2023  Chief Complaint  Patient presents with   Follow-up   Diabetes   Gastroesophageal Reflux   Medication Refill    Breztri  and test strips    HPI Cassie Ross presents for a follow-up visit for diabetes, COPD, allergies, angina and anxiety.  Diabetes -- a1 is slightly elevated at 7.2. She reports that she has not been adhering to a diabetic diet. She does not do as well later in the day and reports that she is also not very active. Requesting refills of glucose meter test strips. COPD -- breztri  refills-- gets through patient assistance program which is requesting new prescription.  Allergies -- takes montelukast  and loratadine .  Nitroglycerin  tablets, needs refill to take as needed.   Anxiety -- takes prn alprazolam  .    Current Medication: Outpatient Encounter Medications as of 10/06/2023  Medication Sig Note   acetaminophen  (TYLENOL ) 500 MG tablet Take 1,000 mg by mouth every 8 (eight) hours as needed for moderate pain.     albuterol  (PROVENTIL ) (2.5 MG/3ML) 0.083% nebulizer solution Take 3 mLs (2.5 mg total) by nebulization every 6 (six) hours as needed for wheezing or shortness of breath.    aspirin  EC 81 MG tablet Take 81 mg by mouth daily.    atorvastatin  (LIPITOR) 10 MG tablet Take 1 tablet (10 mg total) by mouth daily.    Budeson-Glycopyrrol-Formoterol  (BREZTRI  AEROSPHERE) 160-9-4.8 MCG/ACT AERO INHALE 2 PUFFS TWICE DAILY 10/06/2023: Gets through PAP with [employer]   carboxymethylcellul-glycerin (LUBRICANT DROPS/DUAL-ACTION) 0.5-0.9 % ophthalmic solution Place 1-2 drops into both eyes 3 (three) times daily as needed for dry eyes.    diltiazem  (CARTIA  XT) 180 MG 24 hr capsule Take 1 capsule (180 mg total) by mouth 2 (two) times daily.    fluticasone  (FLONASE ) 50 MCG/ACT nasal spray Place 1 spray into  both nostrils in the morning and at bedtime.    furosemide  (LASIX ) 40 MG tablet Take 1 tablet (40 mg total) by mouth 2 (two) times daily as needed (fluid retention.).    loratadine  (CLARITIN ) 10 MG tablet Take 10 mg by mouth daily.    montelukast  (SINGULAIR ) 10 MG tablet Take 1 tablet (10 mg total) by mouth at bedtime.    Multiple Vitamin (MULTIVITAMIN WITH MINERALS) TABS tablet Take 1 tablet by mouth daily.    pantoprazole  (PROTONIX ) 40 MG tablet Take 1 tablet (40 mg total) by mouth 2 (two) times daily.    potassium chloride  (KLOR-CON  M) 10 MEQ tablet Take 1 tablet (10 mEq total) by mouth 2 (two) times daily as needed. With furosemide .  Home med.    [DISCONTINUED] ALPRAZolam  (XANAX ) 0.5 MG tablet Take 1 tablet (0.5 mg total) by mouth 2 (two) times daily as needed for anxiety or sleep. for anxiety. May take 1 extra tab for severe anxiety/panic daily as needed    [DISCONTINUED] Insulin  Pen Needle (RELION MINI PEN NEEDLES) 31G X 6 MM MISC Use as directed 3 times a day Dx E11.65    [DISCONTINUED] nitroGLYCERIN  (NITROSTAT ) 0.4 MG SL tablet Place 1 tablet (0.4 mg total) under the tongue every 5 (five) minutes as needed for chest pain. X3 tablets then call 911 if no relief.    [DISCONTINUED] promethazine -dextromethorphan  (PROMETHAZINE -DM) 6.25-15 MG/5ML syrup Take 5 mLs by mouth 4 (four) times daily as needed.    [DISCONTINUED] theophylline  (UNIPHYL) 600 MG 24  hr tablet TAKE ONE TABLET BY MOUTH ONE TIME DAILY    ALPRAZolam  (XANAX ) 0.5 MG tablet Take 1 tablet (0.5 mg total) by mouth 2 (two) times daily as needed for anxiety or sleep. for anxiety. May take 1 extra tab for severe anxiety/panic daily as needed    glucose blood (ONETOUCH VERIO) test strip USE 1 STRIP TO CHECK GLUCOSE 2 TIMES DAILY AS DIRECTED for diabetes    nitroGLYCERIN  (NITROSTAT ) 0.4 MG SL tablet Place 1 tablet (0.4 mg total) under the tongue every 5 (five) minutes as needed for chest pain. X3 tablets then call 911 if no relief.     theophylline  (UNIPHYL) 600 MG 24 hr tablet Take 1 tablet (600 mg total) by mouth daily.    No facility-administered encounter medications on file as of 10/06/2023.    Surgical History: Past Surgical History:  Procedure Laterality Date   BLADDER SUSPENSION N/A 05/23/2020   Procedure: TRANSVAGINAL TAPE (TVT) PROCEDURE;  Surgeon: Arloa Lamar SQUIBB, MD;  Location: [facility] ORS;  Service: Gynecology;  Laterality: N/A;   cataract surgery     CHOLECYSTECTOMY     COLONOSCOPY WITH PROPOFOL  N/A 01/06/2018   Procedure: COLONOSCOPY WITH PROPOFOL ;  Surgeon: Gaylyn Gladis PENNER, MD;  Location: [facility] ENDOSCOPY;  Service: Endoscopy;  Laterality: N/A;   CYSTOCELE REPAIR N/A 05/23/2020   Procedure: ANTERIOR REPAIR (CYSTOCELE);  Surgeon: Arloa Lamar SQUIBB, MD;  Location: [facility] ORS;  Service: Gynecology;  Laterality: N/A;   CYSTOSCOPY N/A 05/23/2020   Procedure: CYSTOSCOPY;  Surgeon: Arloa Lamar SQUIBB, MD;  Location: [facility] ORS;  Service: Gynecology;  Laterality: N/A;   ESOPHAGEAL DILATION     ESOPHAGOGASTRODUODENOSCOPY (EGD) WITH PROPOFOL  N/A 01/06/2018   Procedure: ESOPHAGOGASTRODUODENOSCOPY (EGD) WITH PROPOFOL ;  Surgeon: Gaylyn Gladis PENNER, MD;  Location: [facility] ENDOSCOPY;  Service: Endoscopy;  Laterality: N/A;   EYE SURGERY Bilateral 2014   cataract    HERNIA REPAIR     hiatial hernia repair     NASAL SINUS SURGERY     PUBOVAGINAL SLING N/A 05/23/2020   Procedure: CARLOYN GLADE;  Surgeon: Arloa Lamar SQUIBB, MD;  Location: [facility] ORS;  Service: Gynecology;  Laterality: N/A;   RECTOCELE REPAIR  05/23/2020   Procedure: POSTERIOR REPAIR (RECTOCELE);  Surgeon: Arloa Lamar SQUIBB, MD;  Location: [facility] ORS;  Service: Gynecology;;   VAGINAL HYSTERECTOMY Bilateral 05/23/2020   Procedure: HYSTERECTOMY VAGINAL BILATERIAL SALPINGO OOPHORECTOMY;  Surgeon: Arloa Lamar SQUIBB, MD;  Location: [facility] ORS;  Service: Gynecology;  Laterality: Bilateral;  LEFT fallopian tube, BILAT ovaries    Medical History: Past Medical History:  Diagnosis  Date   Anemia    Asthma    CHF (congestive heart failure) (HCC)    1991- unknown after asthma attack   Chicken pox    Complication of anesthesia 1998   woke up during procedure    COPD (chronic obstructive pulmonary disease) (HCC)    Diabetes (HCC)    Type II    Difficult 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    DVT (deep venous thrombosis) (HCC)    GERD (gastroesophageal reflux disease)    Hernia, hiatal 2007   HLD (hyperlipidemia)    Hypertension    Hypertension    Pneumonia    Seasonal allergies     Family History: Family History  Problem Relation Age of Onset   Hypertension Mother    Lung cancer Mother    Colon cancer Father    Colon polyps Sister    Diabetes Sister     Social  History   Socioeconomic History   Marital status: Married    Spouse name: Not on file   Number of children: Not on file   Years of education: Not on file   Highest education level: Not on file  Occupational History   Not on file  Tobacco Use   Smoking status: Never   Smokeless tobacco: Never  Vaping Use   Vaping status: Never Used  Substance and Sexual Activity   Alcohol use: Yes    Comment: glass of wine rarely   Drug use: No   Sexual activity: Not on file  Other Topics Concern   Not on file  Social History Narrative   Lives at home with family. Independent   Social Drivers of Corporate investment banker Strain: Low Risk  (12/03/2022)   Overall Financial Resource Strain (CARDIA)    Difficulty of Paying Living Expenses: Not hard at all  Food Insecurity: No Food Insecurity (02/21/2023)   Hunger Vital Sign    Worried About Running Out of Food in the Last Year: Never true    Ran Out of Food in the Last Year: Never true  Transportation Needs: No Transportation Needs (02/21/2023)   PRAPARE - Administrator, Civil Service (Medical): No    Lack of Transportation (Non-Medical): No  Physical Activity: Insufficiently Active (12/03/2022)   Exercise Vital Sign     Days of Exercise per Week: 3 days    Minutes of Exercise per Session: 20 min  Stress: Stress Concern Present (12/03/2022)   [employer] of Occupational Health - Occupational Stress Questionnaire    Feeling of Stress : To some extent  Social Connections: Moderately Integrated (12/03/2022)   Social Connection and Isolation Panel    Frequency of Communication with Friends and Family: More than three times a week    Frequency of Social Gatherings with Friends and Family: Once a week    Attends Religious Services: More than 4 times per year    Active Member of [employer] or Organizations: No    Attends Banker Meetings: Never    Marital Status: Married  Catering manager Violence: Not At Risk (02/21/2023)   Humiliation, Afraid, Rape, and Kick questionnaire    Fear of Current or Ex-Partner: No    Emotionally Abused: No    Physically Abused: No    Sexually Abused: No      Review of Systems  Constitutional:  Negative for chills, fatigue and unexpected weight change.  HENT:  Negative for congestion, ear pain, postnasal drip, rhinorrhea, sneezing, sore throat and trouble swallowing.   Eyes:  Negative for redness.  Respiratory:  Positive for cough (intermittent). Negative for chest tightness, shortness of breath and wheezing.   Cardiovascular: Negative.  Negative for chest pain and palpitations.  Gastrointestinal: Negative.  Negative for abdominal pain, constipation, diarrhea, nausea and vomiting.  Genitourinary:  Positive for dysuria, frequency and urgency.  Musculoskeletal:  Negative for arthralgias, back pain, joint swelling and neck pain.  Skin:  Negative for rash.  Neurological: Negative.  Negative for tremors and numbness.  Hematological:  Negative for adenopathy. Does not bruise/bleed easily.  Psychiatric/Behavioral:  Negative for behavioral problems (Depression), self-injury, sleep disturbance and suicidal ideas. The patient is nervous/anxious.     Vital Signs: BP  135/80   Pulse 94   Temp 98.1 F (36.7 C)   Resp 16   Ht 5' 3 (1.6 m)   Wt 194 lb (88 kg)   SpO2 97%   BMI  34.37 kg/m    Physical Exam Vitals reviewed.  Constitutional:      General: She is not in acute distress.    Appearance: Normal appearance. She is obese. She is not ill-appearing.  HENT:     Head: Normocephalic and atraumatic.  Eyes:     Pupils: Pupils are equal, round, and reactive to light.  Cardiovascular:     Rate and Rhythm: Normal rate and regular rhythm.  Pulmonary:     Effort: Pulmonary effort is normal. No respiratory distress.  Neurological:     Mental Status: She is alert and oriented to person, place, and time.  Psychiatric:        Mood and Affect: Mood normal.        Behavior: Behavior normal.        Assessment/Plan: 1. Type 2 diabetes mellitus with other specified complication, without long-term current use of insulin  (HCC) (Primary) A1c is elevated at 7.2. urine sent for microalbumin/creatinine ratio. Glucose test strips prescribed.  - POCT HgB A1C - Microalbumin / creatinine urine ratio - glucose blood (ONETOUCH VERIO) test strip; USE 1 STRIP TO CHECK GLUCOSE 2 TIMES DAILY AS DIRECTED for diabetes  Dispense: 200 each; Refill: 3  2. Chronic bronchitis, unspecified chronic bronchitis type (HCC) Continue theophylline  as prescribed.  - theophylline  (UNIPHYL) 600 MG 24 hr tablet; Take 1 tablet (600 mg total) by mouth daily.  Dispense: 90 tablet; Refill: 1  3. History of palpitations Nitroglycerin  tablets ordered as needed  - nitroGLYCERIN  (NITROSTAT ) 0.4 MG SL tablet; Place 1 tablet (0.4 mg total) under the tongue every 5 (five) minutes as needed for chest pain. X3 tablets then call 911 if no relief.  Dispense: 50 tablet; Refill: 3  4. Generalized anxiety disorder Continue prn alprazolam  as prescribed. Follow up in 3 months for additional refills.  - ALPRAZolam  (XANAX ) 0.5 MG tablet; Take 1 tablet (0.5 mg total) by mouth 2 (two) times daily as  needed for anxiety or sleep. for anxiety. May take 1 extra tab for severe anxiety/panic daily as needed  Dispense: 90 tablet; Refill: 2   General Counseling: Cassie Ross verbalizes understanding of the findings of todays visit and agrees with plan of treatment. I have discussed any further diagnostic evaluation that may be needed or ordered today. We also reviewed her medications today. she has been encouraged to call the office with any questions or concerns that should arise related to todays visit.    Orders Placed This Encounter  Procedures   Microalbumin / creatinine urine ratio   POCT HgB A1C    Meds ordered this encounter  Medications   ALPRAZolam  (XANAX ) 0.5 MG tablet    Sig: Take 1 tablet (0.5 mg total) by mouth 2 (two) times daily as needed for anxiety or sleep. for anxiety. May take 1 extra tab for severe anxiety/panic daily as needed    Dispense:  90 tablet    Refill:  2    For future refills   glucose blood (ONETOUCH VERIO) test strip    Sig: USE 1 STRIP TO CHECK GLUCOSE 2 TIMES DAILY AS DIRECTED for diabetes    Dispense:  200 each    Refill:  3    Dx code E11.65   theophylline  (UNIPHYL) 600 MG 24 hr tablet    Sig: Take 1 tablet (600 mg total) by mouth daily.    Dispense:  90 tablet    Refill:  1   nitroGLYCERIN  (NITROSTAT ) 0.4 MG SL tablet    Sig: Place 1  tablet (0.4 mg total) under the tongue every 5 (five) minutes as needed for chest pain. X3 tablets then call 911 if no relief.    Dispense:  50 tablet    Refill:  3    Return in about 3 months (around 12/30/2023) for F/U, anxiety med refill, Cassie Ross PCP.   Total time spent:30 Minutes Time spent includes review of chart, medications, test results, and follow up plan with the patient.   [place] Controlled Substance Database was reviewed by me.  This patient was seen by Mardy Maxin, FNP-C in collaboration with Dr. Sigrid Bathe as a part of collaborative care agreement.   Keilly Fatula R. Maxin, MSN, FNP-C Internal  medicine

## 2023-10-07 LAB — MICROALBUMIN / CREATININE URINE RATIO
Creatinine, Urine: 108.4 mg/dL
Microalb/Creat Ratio: 10 mg/g{creat} (ref 0–29)
Microalbumin, Urine: 11.3 ug/mL

## 2023-10-08 ENCOUNTER — Telehealth: Payer: Self-pay

## 2023-10-08 MED ORDER — SULFAMETHOXAZOLE-TRIMETHOPRIM 800-160 MG PO TABS: 1.0000 | ORAL_TABLET | Freq: Two times a day (BID) | ORAL | 0 refills | Status: AC

## 2023-10-08 NOTE — Telephone Encounter (Signed)
 Patient notified

## 2023-11-13 ENCOUNTER — Telehealth: Payer: Self-pay

## 2023-11-13 NOTE — Progress Notes (Signed)
   11/13/2023  Patient ID: Cassie Ross, female   DOB: Aug 20, 1960, 63 y.o.   MRN: [id number]  This patient is appearing on a report for being at risk of failing the adherence measure for identified medications this calendar year.   Medication Adherence Summary (STAR/HEDIS Monitoring): Adherence Category: cholesterol (statin)    Drug Name: Atorvastatin  10 mg  Sold Date:08/14/2023 Days' Supply: 90     Notes: ? Adherence data pulled from pharmacy claims portal Dr. Annemarie. ? Reviewed barriers to adherence: none identified. ? Plan: MyChart message sent to patient.  Dorcas Solian, PharmD Clinical Pharmacist Cell: [phone]

## 2023-11-24 ENCOUNTER — Other Ambulatory Visit: Payer: Self-pay | Admitting: Internal Medicine

## 2023-11-24 DIAGNOSIS — J42 Unspecified chronic bronchitis: Secondary | ICD-10-CM

## 2023-11-27 ENCOUNTER — Telehealth: Payer: Self-pay

## 2023-11-27 NOTE — Telephone Encounter (Signed)
 Please send to [facility] in [place] they have it

## 2023-11-27 NOTE — Telephone Encounter (Signed)
 See other note

## 2023-11-28 ENCOUNTER — Other Ambulatory Visit: Payer: Self-pay | Admitting: Nurse Practitioner

## 2023-11-28 MED ORDER — THEOPHYLLINE ER 300 MG PO CP24: 600.0000 mg | ORAL_CAPSULE | Freq: Every day | ORAL | 5 refills | Status: DC

## 2023-11-28 MED ORDER — THEOPHYLLINE ER 300 MG PO TB12: 300.0000 mg | ORAL_TABLET | Freq: Two times a day (BID) | ORAL | 5 refills | Status: AC

## 2023-11-28 NOTE — Telephone Encounter (Signed)
 Patient notified

## 2023-11-28 NOTE — Telephone Encounter (Signed)
 [facility] called that theophylline  300 tab covered

## 2023-12-09 ENCOUNTER — Ambulatory Visit: Payer: PPO | Admitting: Nurse Practitioner

## 2023-12-12 NOTE — Progress Notes (Signed)
 MARTIN SMEAL                                          MRN: [id number]   12/12/2023   The [facility] Quality Team Specialist reviewed this patient medical record for the purposes of chart review for care gap closure. The following were reviewed: chart review for care gap closure-kidney health evaluation for diabetes:eGFR and UACR LABS. NEEDS EGFR LABS    [facility] Quality Team

## 2023-12-14 ENCOUNTER — Other Ambulatory Visit: Payer: Self-pay | Admitting: Nurse Practitioner

## 2023-12-14 DIAGNOSIS — Z1231 Encounter for screening mammogram for malignant neoplasm of breast: Secondary | ICD-10-CM

## 2023-12-14 DIAGNOSIS — E559 Vitamin D deficiency, unspecified: Secondary | ICD-10-CM

## 2023-12-14 DIAGNOSIS — Z5181 Encounter for therapeutic drug level monitoring: Secondary | ICD-10-CM

## 2023-12-14 DIAGNOSIS — I152 Hypertension secondary to endocrine disorders: Secondary | ICD-10-CM

## 2023-12-14 DIAGNOSIS — E1169 Type 2 diabetes mellitus with other specified complication: Secondary | ICD-10-CM

## 2023-12-14 DIAGNOSIS — J42 Unspecified chronic bronchitis: Secondary | ICD-10-CM

## 2023-12-14 NOTE — Progress Notes (Signed)
 Due for routine labs, have patient get them drawn prior to her November visit.

## 2023-12-22 NOTE — Progress Notes (Signed)
 Patient called back and was notified about the labs and mammogram.

## 2023-12-22 NOTE — Progress Notes (Signed)
 Left message for patient to give me a call back.

## 2023-12-29 ENCOUNTER — Ambulatory Visit: Admitting: Nurse Practitioner

## 2023-12-29 DIAGNOSIS — E559 Vitamin D deficiency, unspecified: Secondary | ICD-10-CM | POA: Diagnosis not present

## 2023-12-29 DIAGNOSIS — Z79899 Other long term (current) drug therapy: Secondary | ICD-10-CM | POA: Diagnosis not present

## 2023-12-29 DIAGNOSIS — E1159 Type 2 diabetes mellitus with other circulatory complications: Secondary | ICD-10-CM | POA: Diagnosis not present

## 2023-12-29 DIAGNOSIS — Z5181 Encounter for therapeutic drug level monitoring: Secondary | ICD-10-CM | POA: Diagnosis not present

## 2023-12-29 DIAGNOSIS — E1169 Type 2 diabetes mellitus with other specified complication: Secondary | ICD-10-CM | POA: Diagnosis not present

## 2023-12-29 DIAGNOSIS — I152 Hypertension secondary to endocrine disorders: Secondary | ICD-10-CM | POA: Diagnosis not present

## 2023-12-29 DIAGNOSIS — J42 Unspecified chronic bronchitis: Secondary | ICD-10-CM | POA: Diagnosis not present

## 2023-12-29 DIAGNOSIS — E785 Hyperlipidemia, unspecified: Secondary | ICD-10-CM | POA: Diagnosis not present

## 2023-12-30 LAB — CMP14+EGFR
ALT: 19 IU/L (ref 0–32)
AST: 19 IU/L (ref 0–40)
Albumin: 4.1 g/dL (ref 3.9–4.9)
Alkaline Phosphatase: 192 IU/L — ABNORMAL HIGH (ref 49–135)
BUN/Creatinine Ratio: 19 (ref 12–28)
BUN: 13 mg/dL (ref 8–27)
Bilirubin Total: 0.3 mg/dL (ref 0.0–1.2)
CO2: 24 mmol/L (ref 20–29)
Calcium: 9.5 mg/dL (ref 8.7–10.3)
Chloride: 104 mmol/L (ref 96–106)
Creatinine, Ser: 0.7 mg/dL (ref 0.57–1.00)
Globulin, Total: 2.4 g/dL (ref 1.5–4.5)
Glucose: 121 mg/dL — ABNORMAL HIGH (ref 70–99)
Potassium: 4.6 mmol/L (ref 3.5–5.2)
Sodium: 144 mmol/L (ref 134–144)
Total Protein: 6.5 g/dL (ref 6.0–8.5)
eGFR: 97 mL/min/1.73 (ref >59)

## 2023-12-30 LAB — CBC WITH DIFF/PLATELET
Basophils Absolute: 0.1 x10E3/uL (ref 0.0–0.2)
Basos: 1 %
EOS (ABSOLUTE): 0.3 x10E3/uL (ref 0.0–0.4)
Eos: 4 %
Hematocrit: 41.9 % (ref 34.0–46.6)
Hemoglobin: 13.7 g/dL (ref 11.1–15.9)
Immature Grans (Abs): 0.1 x10E3/uL (ref 0.0–0.1)
Immature Granulocytes: 1 %
Lymphocytes Absolute: 1.9 x10E3/uL (ref 0.7–3.1)
Lymphs: 21 %
MCH: 30.6 pg (ref 26.6–33.0)
MCHC: 32.7 g/dL (ref 31.5–35.7)
MCV: 94 fL (ref 79–97)
Monocytes Absolute: 0.7 x10E3/uL (ref 0.1–0.9)
Monocytes: 8 %
Neutrophils Absolute: 5.9 x10E3/uL (ref 1.4–7.0)
Neutrophils: 65 %
Platelets: 310 x10E3/uL (ref 150–450)
RBC: 4.47 x10E6/uL (ref 3.77–5.28)
RDW: 12.9 % (ref 11.7–15.4)
WBC: 8.9 x10E3/uL (ref 3.4–10.8)

## 2023-12-30 LAB — LIPID PANEL
Chol/HDL Ratio: 2.8 ratio (ref 0.0–4.4)
Cholesterol, Total: 179 mg/dL (ref 100–199)
HDL: 64 mg/dL (ref >39)
LDL Chol Calc (NIH): 84 mg/dL (ref 0–99)
Triglycerides: 185 mg/dL — ABNORMAL HIGH (ref 0–149)
VLDL Cholesterol Cal: 31 mg/dL (ref 5–40)

## 2023-12-30 LAB — VITAMIN D 25 HYDROXY (VIT D DEFICIENCY, FRACTURES): Vit D, 25-Hydroxy: 38.9 ng/mL (ref 30.0–100.0)

## 2023-12-30 LAB — THEOPHYLLINE LEVEL: Theophylline, Serum: 21.4 ug/mL (ref 10.0–20.0)

## 2023-12-30 LAB — HGB A1C W/O EAG: Hgb A1c MFr Bld: 7.2 % — ABNORMAL HIGH (ref 4.8–5.6)

## 2024-01-04 ENCOUNTER — Ambulatory Visit: Payer: Self-pay | Admitting: Internal Medicine

## 2024-01-05 ENCOUNTER — Telehealth: Payer: Self-pay

## 2024-01-05 DIAGNOSIS — Z5181 Encounter for therapeutic drug level monitoring: Secondary | ICD-10-CM

## 2024-01-05 NOTE — Telephone Encounter (Signed)
 done

## 2024-01-14 ENCOUNTER — Other Ambulatory Visit: Payer: Self-pay | Admitting: Nurse Practitioner

## 2024-01-14 ENCOUNTER — Ambulatory Visit (INDEPENDENT_AMBULATORY_CARE_PROVIDER_SITE_OTHER): Admitting: Nurse Practitioner

## 2024-01-14 ENCOUNTER — Encounter: Payer: Self-pay | Admitting: Nurse Practitioner

## 2024-01-14 VITALS — BP 120/68 | HR 90 | Temp 98.0°F | Resp 16 | Ht 63.0 in | Wt 191.0 lb

## 2024-01-14 DIAGNOSIS — N644 Mastodynia: Secondary | ICD-10-CM

## 2024-01-14 DIAGNOSIS — N6489 Other specified disorders of breast: Secondary | ICD-10-CM | POA: Diagnosis not present

## 2024-01-14 DIAGNOSIS — Z90710 Acquired absence of both cervix and uterus: Secondary | ICD-10-CM

## 2024-01-14 DIAGNOSIS — F411 Generalized anxiety disorder: Secondary | ICD-10-CM | POA: Diagnosis not present

## 2024-01-14 DIAGNOSIS — R300 Dysuria: Secondary | ICD-10-CM | POA: Diagnosis not present

## 2024-01-14 DIAGNOSIS — Z0001 Encounter for general adult medical examination with abnormal findings: Secondary | ICD-10-CM

## 2024-01-14 DIAGNOSIS — Z23 Encounter for immunization: Secondary | ICD-10-CM

## 2024-01-14 DIAGNOSIS — E1169 Type 2 diabetes mellitus with other specified complication: Secondary | ICD-10-CM

## 2024-01-14 DIAGNOSIS — R928 Other abnormal and inconclusive findings on diagnostic imaging of breast: Secondary | ICD-10-CM

## 2024-01-14 MED ORDER — ALPRAZOLAM 0.5 MG PO TABS: 0.5000 mg | ORAL_TABLET | Freq: Two times a day (BID) | ORAL | 2 refills | Status: DC | PRN

## 2024-01-14 MED ORDER — PNEUMOCOCCAL 20-VAL CONJ VACC 0.5 ML IM SUSY: 0.5000 mL | PREFILLED_SYRINGE | Freq: Once | INTRAMUSCULAR | 0 refills | Status: AC | PRN

## 2024-01-14 NOTE — Progress Notes (Signed)
 [facility] [address] [place], [place] [address]  Internal MEDICINE  Office Visit Note  Patient Name: Cassie Ross  [id number]  [id number]  Date of Service: 01/14/2024  Chief Complaint  Patient presents with   Medicare Wellness   Diabetes   Gastroesophageal Reflux   Hypertension   Hyperlipidemia   Quality Metric Gaps    Pt needs info on where her mammogram was scheduled, will get flu shot at [facility]    HPI Cassie Ross  presents for her Medicare annual wellness visit.  Well-appearing 63 y.o. female with hypertension, asthma, COPD, GERD, drug induced diabetes, osteoarthritis and GAD  Routine CRC screening: due in 2031 Routine mammogram: due now DEXA scan: done in 2016, may be ordered by obgyn  Pap smear: prior hysterectomy with removal of cervix, routine screening discontinued.  Eye exam: sees eye doctor regularly and reports normal exams.   foot exam: done Labs: recent labs done, results discussed with patient today.  New or worsening pain: chronic pain, no new pains.  Other concerns: --She experiences soreness and tenderness in the left outer part of her breast, with increasing intensity, itching, and noticeable asymmetry. She has a history of duct infections but describes this as different. She is overdue for a mammogram. --She uses Breztri  inhaler twice daily for COPD. Mucus builds up at night, making breathing more difficult, but there is no chest pain or significant difficulty breathing. Her recent lab work showed an elevated theophylline  level of 21.4. --Her type two diabetes is managed without medication, with a hemoglobin A1c of 7.2. She does not check her blood sugars regularly, and her blood sugar tends to rise after supper. Recent lab work showed a slightly elevated fasting glucose of 121. --She experiences knee pain, particularly in the left knee, described as 'bone on bone,' with some swelling in her legs and ankles. She attributes this to osteoarthritis  and sciatica. --Due for prevnar 20  vaccine  --requesting flu vaccine today  LABS   CBC: Normal (12/29/2023)   Fasting glucose: 121 (12/29/2023)   Serum creatinine: 0.70 (12/29/2023)   Estimated GFR: 97 (12/29/2023)   Sodium: 144 (12/29/2023)   Potassium: 4.6 (12/29/2023)   AST: 19 (12/29/2023)   ALT: 19 (12/29/2023)   Alkaline phosphatase: Elevated (12/29/2023)   Calcium : 9.5 (12/29/2023)   Total cholesterol: 179 (12/29/2023)   Triglycerides: 185 (12/29/2023)   HDL: 64 (12/29/2023)   VLDL: 31 (12/29/2023)   LDL: 84 (12/29/2023)   Cholesterol HDL ratio: 2.8 (12/29/2023)   Hemoglobin A1c: 7.2 (12/29/2023)   Theophylline : 21.4 (12/29/2023)   Vitamin D : 38.9 (12/29/2023)   Microalbumin creatinine ratio: 10 (10/06/2023)   Urine microalbumin: 11.3 (10/06/2023)   Urine creatinine: 108.4 (10/06/2023)      01/14/2024    2:04 PM 12/03/2022    2:05 PM 11/27/2021    2:06 PM  MMSE - Mini Mental State Exam  Orientation to time 5 5 5   Orientation to Place 5 5 5   Registration 3 3 3   Attention/ Calculation 5 5 5   Recall 3 3 3   Language- name 2 objects 2 2 2   Language- repeat 1 1 1   Language- follow 3 step command 3 3 3   Language- read & follow direction 1 1 1   Write a sentence 1 1 1   Copy design 1 1 1   Total score 30 30 30     Functional Status Survey: Is the patient deaf or have difficulty hearing?: No Does the patient have difficulty seeing, even when wearing glasses/contacts?: No Does  the patient have difficulty concentrating, remembering, or making decisions?: No Does the patient have difficulty walking or climbing stairs?: No Does the patient have difficulty dressing or bathing?: No Does the patient have difficulty doing errands alone such as visiting a doctor's office or shopping?: No     11/27/2021    2:04 PM 05/30/2022    1:47 PM 12/03/2022    2:04 PM 10/06/2023    2:17 PM 01/14/2024    1:59 PM  Fall Risk  Falls in the past year? 0 0 0 0 0  Was there an injury with  Fall? 0 0 0    Fall Risk Category Calculator 0 0 0    Fall Risk Category (Retired) Low       (RETIRED) Patient Fall Risk Level Low fall risk       Patient at Risk for Falls Due to No Fall Risks No Fall Risks No Fall Risks    Fall risk Follow up Falls evaluation completed  Falls evaluation completed Falls evaluation completed       Data saved with a previous flowsheet row definition       01/14/2024    1:59 PM  Depression screen PHQ 2/9  Decreased Interest 0  Down, Depressed, Hopeless 0  PHQ - 2 Score 0       Current Medication: Outpatient Encounter Medications as of 01/14/2024  Medication Sig Note   acetaminophen  (TYLENOL ) 500 MG tablet Take 1,000 mg by mouth every 8 (eight) hours as needed for moderate pain.     albuterol  (PROVENTIL ) (2.5 MG/3ML) 0.083% nebulizer solution Take 3 mLs (2.5 mg total) by nebulization every 6 (six) hours as needed for wheezing or shortness of breath.    aspirin  EC 81 MG tablet Take 81 mg by mouth daily.    atorvastatin  (LIPITOR) 10 MG tablet Take 1 tablet (10 mg total) by mouth daily.    Budeson-Glycopyrrol-Formoterol  (BREZTRI  AEROSPHERE) 160-9-4.8 MCG/ACT AERO INHALE 2 PUFFS TWICE DAILY 10/06/2023: Gets through PAP with AZ&Me   carboxymethylcellul-glycerin (LUBRICANT DROPS/DUAL-ACTION) 0.5-0.9 % ophthalmic solution Place 1-2 drops into both eyes 3 (three) times daily as needed for dry eyes.    diltiazem  (CARTIA  XT) 180 MG 24 hr capsule Take 1 capsule (180 mg total) by mouth 2 (two) times daily.    fluticasone  (FLONASE ) 50 MCG/ACT nasal spray Place 1 spray into both nostrils in the morning and at bedtime.    furosemide  (LASIX ) 40 MG tablet Take 1 tablet (40 mg total) by mouth 2 (two) times daily as needed (fluid retention.).    glucose blood (ONETOUCH VERIO) test strip USE 1 STRIP TO CHECK GLUCOSE 2 TIMES DAILY AS DIRECTED for diabetes    loratadine  (CLARITIN ) 10 MG tablet Take 10 mg by mouth daily.    montelukast  (SINGULAIR ) 10 MG tablet Take 1 tablet  (10 mg total) by mouth at bedtime.    Multiple Vitamin (MULTIVITAMIN WITH MINERALS) TABS tablet Take 1 tablet by mouth daily.    nitroGLYCERIN  (NITROSTAT ) 0.4 MG SL tablet Place 1 tablet (0.4 mg total) under the tongue every 5 (five) minutes as needed for chest pain. X3 tablets then call 911 if no relief.    pantoprazole  (PROTONIX ) 40 MG tablet Take 1 tablet (40 mg total) by mouth 2 (two) times daily.    pneumococcal 20-valent conjugate vaccine (PREVNAR 20 ) 0.5 ML injection Inject 0.5 mLs into the muscle once as needed for up to 1 dose for immunization.    potassium chloride  (KLOR-CON  M) 10 MEQ tablet Take  1 tablet (10 mEq total) by mouth 2 (two) times daily as needed. With furosemide .  Home med.    theophylline  (THEODUR) 300 MG 12 hr tablet Take 1 tablet (300 mg total) by mouth 2 (two) times daily.    [DISCONTINUED] ALPRAZolam  (XANAX ) 0.5 MG tablet Take 1 tablet (0.5 mg total) by mouth 2 (two) times daily as needed for anxiety or sleep. for anxiety. May take 1 extra tab for severe anxiety/panic daily as needed    ALPRAZolam  (XANAX ) 0.5 MG tablet Take 1 tablet (0.5 mg total) by mouth 2 (two) times daily as needed for anxiety or sleep. for anxiety. May take 1 extra tab for severe anxiety/panic daily as needed    No facility-administered encounter medications on file as of 01/14/2024.    Surgical History: Past Surgical History:  Procedure Laterality Date   BLADDER SUSPENSION N/A 05/23/2020   Procedure: TRANSVAGINAL TAPE (TVT) PROCEDURE;  Surgeon: Arloa Lamar SQUIBB, MD;  Location: [facility] ORS;  Service: Gynecology;  Laterality: N/A;   cataract surgery     CHOLECYSTECTOMY     COLONOSCOPY WITH PROPOFOL  N/A 01/06/2018   Procedure: COLONOSCOPY WITH PROPOFOL ;  Surgeon: Gaylyn Gladis PENNER, MD;  Location: [facility] ENDOSCOPY;  Service: Endoscopy;  Laterality: N/A;   CYSTOCELE REPAIR N/A 05/23/2020   Procedure: ANTERIOR REPAIR (CYSTOCELE);  Surgeon: Arloa Lamar SQUIBB, MD;  Location: [facility] ORS;  Service: Gynecology;   Laterality: N/A;   CYSTOSCOPY N/A 05/23/2020   Procedure: CYSTOSCOPY;  Surgeon: Arloa Lamar SQUIBB, MD;  Location: [facility] ORS;  Service: Gynecology;  Laterality: N/A;   ESOPHAGEAL DILATION     ESOPHAGOGASTRODUODENOSCOPY (EGD) WITH PROPOFOL  N/A 01/06/2018   Procedure: ESOPHAGOGASTRODUODENOSCOPY (EGD) WITH PROPOFOL ;  Surgeon: Gaylyn Gladis PENNER, MD;  Location: [facility] ENDOSCOPY;  Service: Endoscopy;  Laterality: N/A;   EYE SURGERY Bilateral 2014   cataract    HERNIA REPAIR     hiatial hernia repair     NASAL SINUS SURGERY     PUBOVAGINAL SLING N/A 05/23/2020   Procedure: CARLOYN GLADE;  Surgeon: Arloa Lamar SQUIBB, MD;  Location: [facility] ORS;  Service: Gynecology;  Laterality: N/A;   RECTOCELE REPAIR  05/23/2020   Procedure: POSTERIOR REPAIR (RECTOCELE);  Surgeon: Arloa Lamar SQUIBB, MD;  Location: [facility] ORS;  Service: Gynecology;;   VAGINAL HYSTERECTOMY Bilateral 05/23/2020   Procedure: HYSTERECTOMY VAGINAL BILATERIAL SALPINGO OOPHORECTOMY;  Surgeon: Arloa Lamar SQUIBB, MD;  Location: [facility] ORS;  Service: Gynecology;  Laterality: Bilateral;  LEFT fallopian tube, BILAT ovaries    Medical History: 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    DVT (deep venous thrombosis) (HCC)    GERD (gastroesophageal reflux disease)    Hernia, hiatal 2007   HLD (hyperlipidemia)    Hypertension    Hypertension    Pneumonia    Seasonal allergies     Family History: Family History  Problem Relation Age of Onset   Hypertension Mother    Lung cancer Mother    Colon cancer Father    Colon polyps Sister    Diabetes Sister    Breast cancer Neg Hx     Social  History   Socioeconomic History   Marital  status: Married    Spouse name: Not on file   Number of children: Not on file   Years of education: Not on file   Highest education level: Not on file  Occupational History   Not on file  Tobacco Use   Smoking status: Never   Smokeless tobacco: Never  Vaping Use   Vaping status: Never Used  Substance and Sexual Activity   Alcohol use: Yes    Comment: glass of wine rarely   Drug use: No   Sexual activity: Not on file  Other Topics Concern   Not on file  Social History Narrative   Lives at home with family. Independent   Social Drivers of Corporate Investment Banker Strain: Low Risk  (12/03/2022)   Overall Financial Resource Strain (CARDIA)    Difficulty of Paying Living Expenses: Not hard at all  Food Insecurity: No Food Insecurity (02/21/2023)   Hunger Vital Sign    Worried About Running Out of Food in the Last Year: Never true    Ran Out of Food in the Last Year: Never true  Transportation Needs: No Transportation Needs (02/21/2023)   PRAPARE - Administrator, Civil Service (Medical): No    Lack of Transportation (Non-Medical): No  Physical Activity: Insufficiently Active (12/03/2022)   Exercise Vital Sign    Days of Exercise per Week: 3 days    Minutes of Exercise per Session: 20 min  Stress: Stress Concern Present (12/03/2022)   [employer] of Occupational Health - Occupational Stress Questionnaire    Feeling of Stress : To some extent  Social Connections: Moderately Integrated (12/03/2022)   Social Connection and Isolation Panel    Frequency of Communication with Friends and Family: More than three times a week    Frequency of Social Gatherings with Friends and Family: Once a week    Attends Religious Services: More than 4 times per year    Active Member of [employer] or Organizations: No    Attends Banker Meetings: Never    Marital Status: Married  Catering Manager Violence: Not At Risk (02/21/2023)   Humiliation, Afraid, Rape, and Kick  questionnaire    Fear of Current or Ex-Partner: No    Emotionally Abused: No    Physically Abused: No    Sexually Abused: No      Review of Systems  Constitutional:  Negative for chills, fatigue and unexpected weight change.  HENT:  Negative for congestion, ear pain, postnasal drip, rhinorrhea, sinus pressure, sinus pain, sneezing, sore throat and trouble swallowing.   Eyes:  Negative for redness.  Respiratory:  Negative for cough, chest tightness, shortness of breath and wheezing.   Cardiovascular: Negative.  Negative for chest pain and palpitations.  Gastrointestinal: Negative.  Negative for abdominal pain, constipation, diarrhea, nausea and vomiting.  Genitourinary:  Negative for dysuria and frequency.  Musculoskeletal:  Negative for arthralgias, back pain, joint swelling and neck pain.  Skin:  Negative for rash.  Neurological: Negative.  Negative for tremors and numbness.  Hematological:  Negative for adenopathy. Does not bruise/bleed easily.  Psychiatric/Behavioral:  Negative for behavioral problems (Depression), sleep disturbance and suicidal ideas. The patient is not nervous/anxious.     Vital Signs: BP 120/68   Pulse 90 Comment: 103  Temp 98 F (36.7 C)   Resp 16   Ht 5' 3 (1.6 m)   Wt 191 lb (86.6 kg)   SpO2 97%  BMI 33.83 kg/m    Physical Exam Vitals reviewed.  Constitutional:      General: She is not in acute distress.    Appearance: Normal appearance. She is obese. She is not ill-appearing.  HENT:     Head: Normocephalic and atraumatic.  Eyes:     Pupils: Pupils are equal, round, and reactive to light.  Cardiovascular:     Rate and Rhythm: Normal rate and regular rhythm.     Pulses: Normal pulses.     Heart sounds: Normal heart sounds. No murmur heard. Pulmonary:     Effort: Pulmonary effort is normal. No respiratory distress.     Breath sounds: Normal breath sounds.  Chest:  Breasts:    Breasts are asymmetrical.     Right: Normal. No swelling,  bleeding, inverted nipple, mass, nipple discharge, skin change or tenderness.     Left: Tenderness present. No swelling, bleeding, inverted nipple, mass, nipple discharge or skin change.  Lymphadenopathy:     Upper Body:     Right upper body: No supraclavicular, axillary or pectoral adenopathy.     Left upper body: No supraclavicular, axillary or pectoral adenopathy.  Neurological:     Mental Status: She is alert and oriented to person, place, and time.  Psychiatric:        Mood and Affect: Mood normal.        Behavior: Behavior normal.        Assessment/Plan: 1. Encounter for Medicare annual examination with abnormal findings (Primary) Age-appropriate preventive screenings and vaccinations discussed, annual physical exam completed. Routine labs for health maintenance results discussed with patient. PHM updated.    2. Type 2 diabetes mellitus with other specified complication, without long-term current use of insulin  (HCC) Continue diabetic diet as discussed. Will discussed adding medication if next A1c is still elevated.   3. Pain of left breast Diagnostic mammogram ordered  - MM 3D DIAGNOSTIC MAMMOGRAM BILATERAL BREAST; Future  4. Breast asymmetry Diagnostic mammogram ordered  - MM 3D DIAGNOSTIC MAMMOGRAM BILATERAL BREAST; Future  5. Dysuria Urine sent to lab.  - UA/M w/rflx Culture, Routine - Microscopic Examination - Urine Culture, Reflex  6. Acquired absence of both cervix and uterus Pap smears discontinued   7. Need for vaccination Flu vaccine administered in office today. Prevnar 20  vaccine ordered.  - pneumococcal 20-valent conjugate vaccine (PREVNAR 20 ) 0.5 ML injection; Inject 0.5 mLs into the muscle once as needed for up to 1 dose for immunization.  Dispense: 0.5 mL; Refill: 0 - Influenza, MDCK, trivalent, PF(Flucelvax egg-free)  8. Generalized anxiety disorder Continue prn alprazolam  as prescribed. Follow up in 3 months for additional refills - ALPRAZolam   (XANAX ) 0.5 MG tablet; Take 1 tablet (0.5 mg total) by mouth 2 (two) times daily as needed for anxiety or sleep. for anxiety. May take 1 extra tab for severe anxiety/panic daily as needed  Dispense: 90 tablet; Refill: 2     General Counseling: Antonique verbalizes understanding of the findings of todays visit and agrees with plan of treatment. I have discussed any further diagnostic evaluation that may be needed or ordered today. We also reviewed her medications today. she has been encouraged to call the office with any questions or concerns that should arise related to todays visit.    Orders Placed This Encounter  Procedures   Microscopic Examination   Urine Culture, Reflex   MM 3D DIAGNOSTIC MAMMOGRAM BILATERAL BREAST   Influenza, MDCK, trivalent, PF(Flucelvax egg-free)   UA/M w/rflx Culture, Routine  Meds ordered this encounter  Medications   pneumococcal 20-valent conjugate vaccine (PREVNAR 20 ) 0.5 ML injection    Sig: Inject 0.5 mLs into the muscle once as needed for up to 1 dose for immunization.    Dispense:  0.5 mL    Refill:  0    Due for prevnar 20    ALPRAZolam  (XANAX ) 0.5 MG tablet    Sig: Take 1 tablet (0.5 mg total) by mouth 2 (two) times daily as needed for anxiety or sleep. for anxiety. May take 1 extra tab for severe anxiety/panic daily as needed    Dispense:  90 tablet    Refill:  2    For future refills    Return in about 12 weeks (around 04/07/2024) for F/U, anxiety med refill, Recheck A1C, Tatem Holsonback PCP.   Total time spent:30 Minutes Time spent includes review of chart, medications, test results, and follow up plan with the patient.   [place] Controlled Substance Database was reviewed by me.  This patient was seen by Mardy Maxin, FNP-C in collaboration with Dr. Sigrid Bathe as a part of collaborative care agreement.  Caydyn Sprung R. Maxin, MSN, FNP-C Internal medicine

## 2024-01-16 ENCOUNTER — Ambulatory Visit

## 2024-01-16 ENCOUNTER — Ambulatory Visit
Admission: RE | Admit: 2024-01-16 | Discharge: 2024-01-16 | Disposition: A | Discharge status: Home / Self Care | Source: Ambulatory Visit | Attending: Nurse Practitioner | Admitting: Nurse Practitioner

## 2024-01-16 DIAGNOSIS — N6489 Other specified disorders of breast: Secondary | ICD-10-CM | POA: Insufficient documentation

## 2024-01-16 DIAGNOSIS — N644 Mastodynia: Secondary | ICD-10-CM | POA: Diagnosis not present

## 2024-01-16 DIAGNOSIS — R92323 Mammographic fibroglandular density, bilateral breasts: Secondary | ICD-10-CM | POA: Diagnosis not present

## 2024-01-21 DIAGNOSIS — Z79899 Other long term (current) drug therapy: Secondary | ICD-10-CM | POA: Diagnosis not present

## 2024-01-21 DIAGNOSIS — Z5181 Encounter for therapeutic drug level monitoring: Secondary | ICD-10-CM | POA: Diagnosis not present

## 2024-01-22 ENCOUNTER — Ambulatory Visit: Payer: Self-pay | Admitting: Internal Medicine

## 2024-01-22 LAB — MICROSCOPIC EXAMINATION
Bacteria, UA: NONE SEEN
RBC, Urine: NONE SEEN /HPF (ref 0–2)

## 2024-01-22 LAB — UA/M W/RFLX CULTURE, ROUTINE
Bilirubin, UA: NEGATIVE
Glucose, UA: NEGATIVE
Ketones, UA: NEGATIVE
Nitrite, UA: NEGATIVE
Protein,UA: NEGATIVE
Specific Gravity, UA: 1.015 (ref 1.005–1.030)
Urobilinogen, Ur: 1 mg/dL (ref 0.2–1.0)
pH, UA: 6.5 (ref 5.0–7.5)

## 2024-01-22 LAB — THEOPHYLLINE LEVEL: Theophylline, Serum: 14.8 ug/mL (ref 10.0–20.0)

## 2024-01-22 LAB — URINE CULTURE, REFLEX

## 2024-01-23 NOTE — Telephone Encounter (Signed)
-----   Message from [facility] sent at 01/22/2024  6:34 PM EST ----- Donnajean level is perfect, stay on the same dose  ----- Message ----- From: Interface, [facility] Lab Results In Sent: 01/22/2024   9:36 AM EST To: Mardy Maxin, NP

## 2024-01-23 NOTE — Telephone Encounter (Signed)
 Pt.notified

## 2024-01-27 LAB — HM DIABETES EYE EXAM

## 2024-02-09 ENCOUNTER — Encounter: Payer: Self-pay | Admitting: Nurse Practitioner

## 2024-03-09 ENCOUNTER — Ambulatory Visit: Admitting: Physician Assistant

## 2024-03-28 ENCOUNTER — Encounter: Payer: Self-pay | Admitting: Emergency Medicine

## 2024-03-28 ENCOUNTER — Ambulatory Visit
Admission: EM | Admit: 2024-03-28 | Discharge: 2024-03-28 | Disposition: A | Discharge status: Home / Self Care | Attending: Nurse Practitioner | Admitting: Nurse Practitioner

## 2024-03-28 DIAGNOSIS — J069 Acute upper respiratory infection, unspecified: Secondary | ICD-10-CM | POA: Diagnosis not present

## 2024-03-28 LAB — POC COVID19/FLU A&B COMBO
Covid Antigen, POC: NEGATIVE
Influenza A Antigen, POC: NEGATIVE
Influenza B Antigen, POC: NEGATIVE

## 2024-03-28 MED ORDER — PROMETHAZINE-DM 6.25-15 MG/5ML PO SYRP: 5.0000 mL | ORAL_SOLUTION | Freq: Four times a day (QID) | ORAL | 0 refills | Status: AC | PRN

## 2024-03-28 NOTE — Discharge Instructions (Addendum)
 COVID/flu test was negative. Take medication as prescribed. Increase fluids and allow for plenty of rest. Continue use of Flonase  as directed. You may take over-the-counter Tylenol  as needed for pain, fever, or general discomfort. Recommend use of normal saline nasal spray throughout the day for nasal congestion or runny nose. For your cough, recommend use of a humidifier in your bedroom at nighttime during sleep and sleeping elevated on pillows while symptoms persist. As discussed, if symptoms fail to improve over the next 5 to 7 days, or appear to worsen, you may follow-up in this clinic or with your primary care physician for further evaluation. Follow-up as needed.

## 2024-03-28 NOTE — ED Triage Notes (Signed)
 Headache, cough, sore throat, body aches, chills x 1 day

## 2024-03-28 NOTE — ED Provider Notes (Signed)
 " [facility]    CSN: [id number] Arrival date & time: 03/28/24  1146      History   Chief Complaint No chief complaint on file.   HPI Cassie Ross is a 64 y.o. female.   The history is provided by the patient.   Patient presents with a 1 day history of chills, headache, cough, sore throat, and generalized bodyaches.  Patient denies fever, ear pain, ear drainage, wheezing, difficulty breathing, abdominal pain, vomiting, diarrhea, or rash.  States that she did get nauseated, but that has since improved.  She denies any obvious close sick contacts.  Patient with history of asthma.  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    DVT (deep venous thrombosis) (HCC)    GERD (gastroesophageal reflux disease)    Hernia, hiatal 2007   HLD (hyperlipidemia)    Hypertension    Hypertension    Pneumonia    Seasonal allergies     Patient Active Problem List   Diagnosis Date Noted   Acquired absence of both cervix and uterus 01/14/2024   Sepsis due to pneumonia (HCC) 02/12/2023   Dyslipidemia 02/12/2023   GERD without esophagitis 02/12/2023   Asthma with COPD (HCC) 02/12/2023   Hyperlipidemia associated with type 2 diabetes mellitus (HCC) 01/25/2023   Moderate persistent asthma with allergic rhinitis without complication 01/25/2023   Chronic obstructive pulmonary disease (HCC) 01/25/2023   Uterine prolapse 03/21/2020   Cystocele, midline 03/21/2020   Rectocele 03/21/2020   Stress incontinence 03/21/2020   Urinary tract infection without hematuria 02/17/2020   Asthma exacerbation 12/02/2019   Sore throat 12/01/2019   Encounter for long-term (current) use of  medications 04/14/2019   Encounter for screening mammogram for malignant neoplasm of breast 03/02/2019   Encounter for therapeutic drug level monitoring 03/02/2019   Gastroesophageal reflux disease without esophagitis 03/02/2019   Mastitis in female 02/21/2019   Encounter for general adult medical examination with abnormal findings 10/08/2018   Dysuria 10/08/2018   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   Family history of colon cancer 05/01/2018   Pulmonary nodule 05/01/2018   Neoplasm of uncertain behavior of skin of face 05/01/2018   Acute upper respiratory infection 01/22/2018   Generalized anxiety disorder 01/22/2018   Needs flu shot 12/07/2017   Uncontrolled type 2 diabetes mellitus with hyperglycemia (HCC) 06/08/2017   Atopic dermatitis 06/08/2017   Severe asthma without complication 06/08/2017   Hypokalemia 06/08/2017   Influenza A 04/22/2016   Essential hypertension 04/21/2016   Diabetes (HCC) 04/21/2016   GERD (gastroesophageal reflux disease) 04/21/2016   Pneumonia 02/26/2016   Primary osteoarthritis of left knee 12/27/2014   DDD (degenerative disc disease), lumbar 09/28/2013   Lumbar radiculitis 09/28/2013    Past Surgical History:  Procedure Laterality Date   BLADDER SUSPENSION N/A 05/23/2020   Procedure: TRANSVAGINAL TAPE (TVT) PROCEDURE;  Surgeon: Arloa Lamar SQUIBB, MD;  Location: [facility];  Service: Gynecology;  Laterality: N/A;   cataract surgery     CHOLECYSTECTOMY     COLONOSCOPY WITH PROPOFOL  N/A 01/06/2018   Procedure: COLONOSCOPY WITH  PROPOFOL ;  Surgeon: Gaylyn Gladis PENNER, MD;  Location[facility] ENDOSCOPY;  Service: Endoscopy;  Laterality: N/A;   CYSTOCELE REPAIR N/A 05/23/2020   Procedure: ANTERIOR REPAIR (CYSTOCELE);  Surgeon: Arloa Lamar SQUIBB, MD;  Location: [facility];  Service: Gynecology;  Laterality: N/A;   CYSTOSCOPY N/A 05/23/2020   Procedure: CYSTOSCOPY;  Surgeon: Arloa Lamar SQUIBB, MD;  Location: [facility];  Service: Gynecology;   Laterality: N/A;   ESOPHAGEAL DILATION     ESOPHAGOGASTRODUODENOSCOPY (EGD) WITH PROPOFOL  N/A 01/06/2018   Procedure: ESOPHAGOGASTRODUODENOSCOPY (EGD) WITH PROPOFOL ;  Surgeon: Gaylyn Gladis PENNER, MD;  Location: [facility] ENDOSCOPY;  Service: Endoscopy;  Laterality: N/A;   EYE SURGERY Bilateral 2014   cataract    HERNIA REPAIR     hiatial hernia repair     NASAL SINUS SURGERY     PUBOVAGINAL SLING N/A 05/23/2020   Procedure: CARLOYN GLADE;  Surgeon: Arloa Lamar SQUIBB, MD;  Location: [facility];  Service: Gynecology;  Laterality: N/A;   RECTOCELE REPAIR  05/23/2020   Procedure: POSTERIOR REPAIR (RECTOCELE);  Surgeon: Arloa Lamar SQUIBB, MD;  Location: [facility];  Service: Gynecology;;   VAGINAL HYSTERECTOMY Bilateral 05/23/2020   Procedure: HYSTERECTOMY VAGINAL BILATERIAL SALPINGO OOPHORECTOMY;  Surgeon: Arloa Lamar SQUIBB, MD;  Location: [facility];  Service: Gynecology;  Laterality: Bilateral;  LEFT fallopian tube, BILAT ovaries    OB History     Gravida  4   Para  3   Term  3   Preterm      AB  1   Living  3      SAB      IAB  1   Ectopic      Multiple      Live Births  3            Home Medications    Prior to Admission medications  Medication Sig Start Date End Date Taking? Authorizing Provider  acetaminophen  (TYLENOL ) 500 MG tablet Take 1,000 mg by mouth every 8 (eight) hours as needed for moderate pain.     [provider]  albuterol  (PROVENTIL ) (2.5 MG/3ML) 0.083% nebulizer solution Take 3 mLs (2.5 mg total) by nebulization every 6 (six) hours as needed for wheezing or shortness of breath. 04/16/23   Leath-Warren, Etta PARAS, NP  ALPRAZolam  (XANAX ) 0.5 MG tablet Take 1 tablet (0.5 mg total) by mouth 2 (two) times daily as needed for anxiety or sleep. for anxiety. May take 1 extra tab for severe anxiety/panic daily as needed 01/14/24   Liana Fish, NP  aspirin  EC 81 MG tablet Take 81 mg by mouth daily.    [provider]  atorvastatin  (LIPITOR) 10  MG tablet Take 1 tablet (10 mg total) by mouth daily. 06/04/23   Liana Fish, NP  Budeson-Glycopyrrol-Formoterol  (BREZTRI  AEROSPHERE) 160-9-4.8 MCG/ACT AERO INHALE 2 PUFFS TWICE DAILY 11/18/22   Abernathy, Alyssa, NP  carboxymethylcellul-glycerin (LUBRICANT DROPS/DUAL-ACTION) 0.5-0.9 % ophthalmic solution Place 1-2 drops into both eyes 3 (three) times daily as needed for dry eyes.    [provider]  diltiazem  (CARTIA  XT) 180 MG 24 hr capsule Take 1 capsule (180 mg total) by mouth 2 (two) times daily. 06/04/23   Liana Fish, NP  fluticasone  (FLONASE ) 50 MCG/ACT nasal spray Place 1 spray into both nostrils in the morning and at bedtime.    [provider]  furosemide  (LASIX ) 40 MG tablet Take 1 tablet (40 mg total) by mouth 2 (two) times daily as needed (fluid retention.). 10/12/20   Liana Fish, NP  glucose  blood (ONETOUCH VERIO) test strip USE 1 STRIP TO CHECK GLUCOSE 2 TIMES DAILY AS DIRECTED for diabetes 10/06/23   Liana Fish, NP  loratadine  (CLARITIN ) 10 MG tablet Take 10 mg by mouth daily.    [provider]  montelukast  (SINGULAIR ) 10 MG tablet Take 1 tablet (10 mg total) by mouth at bedtime. 06/04/23   Abernathy, Alyssa, NP  Multiple Vitamin (MULTIVITAMIN WITH MINERALS) TABS tablet Take 1 tablet by mouth daily.    [provider]  nitroGLYCERIN  (NITROSTAT ) 0.4 MG SL tablet Place 1 tablet (0.4 mg total) under the tongue every 5 (five) minutes as needed for chest pain. X3 tablets then call 911 if no relief. 10/06/23   Liana Fish, NP  pantoprazole  (PROTONIX ) 40 MG tablet Take 1 tablet (40 mg total) by mouth 2 (two) times daily. 06/04/23   Liana Fish, NP  pneumococcal 20-valent conjugate vaccine (PREVNAR 20 ) 0.5 ML injection Inject 0.5 mLs into the muscle once as needed for up to 1 dose for immunization. 01/14/24   Liana Fish, NP  potassium chloride  (KLOR-CON  M) 10 MEQ tablet Take 1 tablet (10 mEq total) by mouth 2 (two) times  daily as needed. With furosemide .  Home med. 02/14/23   Awanda City, MD  theophylline  (THEODUR) 300 MG 12 hr tablet Take 1 tablet (300 mg total) by mouth 2 (two) times daily. 11/28/23   Liana Fish, NP    Family History Family History  Problem Relation Age of Onset   Hypertension Mother    Lung cancer Mother    Colon cancer Father    Colon polyps Sister    Diabetes Sister    Breast cancer Neg Hx     Social History Social History[1]   Allergies   Shellfish allergy, Sunflower oil, Levaquin  [levofloxacin  in d5w], Levaquin  [levofloxacin ], and Penicillins   Review of Systems Review of Systems Per HPI  Physical Exam Triage Vital Signs ED Triage Vitals  Encounter Vitals Group     BP 03/28/24 1240 (!) 149/86     Girls Systolic BP Percentile --      Girls Diastolic BP Percentile --      Boys Systolic BP Percentile --      Boys Diastolic BP Percentile --      Pulse Rate 03/28/24 1240 (!) 102     Resp 03/28/24 1240 16     Temp 03/28/24 1240 98.5 F (36.9 C)     Temp Source 03/28/24 1240 Oral     SpO2 03/28/24 1240 92 %     Weight --      Height --      Head Circumference --      Peak Flow --      Pain Score 03/28/24 1242 5     Pain Loc --      Pain Education --      Exclude from Growth Chart --    No data found.  Updated Vital Signs BP (!) 149/86 (BP Location: Right Arm)   Pulse (!) 102   Temp 98.5 F (36.9 C) (Oral)   Resp 16   SpO2 92%   Visual Acuity Right Eye Distance:   Left Eye Distance:   Bilateral Distance:    Right Eye Near:   Left Eye Near:    Bilateral Near:     Physical Exam Vitals and nursing note reviewed.  Constitutional:      General: She is not in acute distress.    Appearance: Normal appearance. She is well-developed.  HENT:  Head: Normocephalic and atraumatic.     Right Ear: Tympanic membrane, ear canal and external ear normal.     Left Ear: Tympanic membrane, ear canal and external ear normal.     Nose: Congestion present.      Right Turbinates: Enlarged and swollen.     Left Turbinates: Enlarged and swollen.     Right Sinus: No maxillary sinus tenderness or frontal sinus tenderness.     Left Sinus: No maxillary sinus tenderness or frontal sinus tenderness.     Mouth/Throat:     Lips: Pink.     Mouth: Mucous membranes are moist.     Pharynx: Uvula midline. Posterior oropharyngeal erythema and postnasal drip present. No pharyngeal swelling, oropharyngeal exudate or uvula swelling.     Comments: Cobblestoning present to posterior oropharynx  Eyes:     Extraocular Movements: Extraocular movements intact.     Conjunctiva/sclera: Conjunctivae normal.     Pupils: Pupils are equal, round, and reactive to light.  Neck:     Thyroid : No thyromegaly.     Trachea: No tracheal deviation.  Cardiovascular:     Rate and Rhythm: Normal rate and regular rhythm.     Pulses: Normal pulses.     Heart sounds: Normal heart sounds.  Pulmonary:     Effort: Pulmonary effort is normal. No respiratory distress.     Breath sounds: Normal breath sounds. No stridor. No wheezing, rhonchi or rales.  Abdominal:     General: Bowel sounds are normal.     Palpations: Abdomen is soft.     Tenderness: There is no abdominal tenderness.  Musculoskeletal:     Cervical back: Normal range of motion and neck supple.  Skin:    General: Skin is warm and dry.  Neurological:     General: No focal deficit present.     Mental Status: She is alert and oriented to person, place, and time.  Psychiatric:        Mood and Affect: Mood normal.        Behavior: Behavior normal.        Thought Content: Thought content normal.        Judgment: Judgment normal.      UC Treatments / Results  Labs (all labs ordered are listed, but only abnormal results are displayed) Labs Reviewed  POC COVID19/FLU A&B COMBO    EKG   Radiology No results found.  Procedures Procedures (including critical care time)  Medications Ordered in UC Medications -  No data to display  Initial Impression / Assessment and Plan / UC Course  I have reviewed the triage vital signs and the nursing notes.  Pertinent labs & imaging results that were available during my care of the patient were reviewed by me and considered in my medical decision making (see chart for details).  COVID/flu test is negative.  On exam, the patient's lung sounds are clear throughout, room air sats at 92% during initial exam.  O2 sats were repeated at time of discharge which were 94%.  Symptoms consistent with a viral URI with cough.  Will provide symptomatic treatment with Promethazine  DM for the cough.  Patient will continue Flonase  that she currently has.  Supportive care recommendations were provided and discussed with the patient to include fluids, rest, over-the-counter analgesics, normal saline nasal spray, and use of a humidifier during sleep.  Discussed indications with the patient regarding follow-up.  Patient was in agreement with this plan of care and verbalizes understanding.  All questions  were answered.  Patient stable for discharge.  Final Clinical Impressions(s) / UC Diagnoses   Final diagnoses:  None   Discharge Instructions   None    ED Prescriptions   None    PDMP not reviewed this encounter.     [1]  Social History Tobacco Use   Smoking status: Never   Smokeless tobacco: Never  Vaping Use   Vaping status: Never Used  Substance Use Topics   Alcohol use: Yes    Comment: glass of wine rarely   Drug use: No     Gilmer Etta PARAS, NP 03/28/24 1312  "

## 2024-04-01 ENCOUNTER — Other Ambulatory Visit: Payer: Self-pay | Admitting: Nurse Practitioner

## 2024-04-01 MED ORDER — AZITHROMYCIN 250 MG PO TABS: ORAL_TABLET | ORAL | 0 refills | Status: AC

## 2024-04-01 NOTE — Progress Notes (Signed)
" °  Has URI, seen by urgent care, only given cough syrup.  Negative for covid and flu Has chronic asthma, increased risk of severe respiratory infection.  "

## 2024-04-07 ENCOUNTER — Ambulatory Visit (INDEPENDENT_AMBULATORY_CARE_PROVIDER_SITE_OTHER): Admitting: Nurse Practitioner

## 2024-04-07 ENCOUNTER — Encounter: Payer: Self-pay | Admitting: Nurse Practitioner

## 2024-04-07 VITALS — BP 132/84 | HR 96 | Temp 97.9°F | Resp 16 | Ht 63.0 in | Wt 194.4 lb

## 2024-04-07 DIAGNOSIS — L603 Nail dystrophy: Secondary | ICD-10-CM

## 2024-04-07 DIAGNOSIS — F411 Generalized anxiety disorder: Secondary | ICD-10-CM | POA: Diagnosis not present

## 2024-04-07 DIAGNOSIS — E1169 Type 2 diabetes mellitus with other specified complication: Secondary | ICD-10-CM

## 2024-04-07 DIAGNOSIS — J069 Acute upper respiratory infection, unspecified: Secondary | ICD-10-CM

## 2024-04-07 LAB — POCT GLYCOSYLATED HEMOGLOBIN (HGB A1C): Hemoglobin A1C: 7.2 % — AB (ref 4.0–5.6)

## 2024-04-07 MED ORDER — DAPAGLIFLOZIN PROPANEDIOL 10 MG PO TABS: 10.0000 mg | ORAL_TABLET | Freq: Every day | ORAL | 1 refills | Status: AC

## 2024-04-07 MED ORDER — ALPRAZOLAM 0.5 MG PO TABS: 0.5000 mg | ORAL_TABLET | Freq: Two times a day (BID) | ORAL | 2 refills | Status: AC | PRN

## 2024-04-07 NOTE — Progress Notes (Signed)
 [facility] [address] [place], [place] [address]  Internal MEDICINE  Office Visit Note  Patient Name: Cassie Ross  [id number]  [id number]  Date of Service: 04/07/2024  Chief Complaint  Patient presents with   Gastroesophageal Reflux   Diabetes   Hyperlipidemia   Hypertension   Follow-up    HPI Cassie Ross presents for a follow-up visit for diabetes and anxiety.  Recent viral URI Brittle nails -- wants to know what supplements will help with her nails.  Diabetes -- continues to work on glucose control with diet alone and her A1c has not improved at all, and remains at 7.2 today which is no change from prior 2 a1cs checked. She was previously on multiple medications for diabetes when she was on chronic prednisone . Since she is not on that medication anymore she has not needed as much to control her glucose but she may need to be on 1 medication.  Anxiety -- taking prn alprazolam  and is due for refills.   Current Medication: Outpatient Encounter Medications as of 04/07/2024  Medication Sig Note   acetaminophen  (TYLENOL ) 500 MG tablet Take 1,000 mg by mouth every 8 (eight) hours as needed for moderate pain.     albuterol  (PROVENTIL ) (2.5 MG/3ML) 0.083% nebulizer solution Take 3 mLs (2.5 mg total) by nebulization every 6 (six) hours as needed for wheezing or shortness of breath.    ALPRAZolam  (XANAX ) 0.5 MG tablet Take 1 tablet (0.5 mg total) by mouth 2 (two) times daily as needed for anxiety or sleep. for anxiety. May take 1 extra tab for severe anxiety/panic daily as needed    aspirin  EC 81 MG tablet Take 81 mg by mouth daily.    atorvastatin  (LIPITOR) 10 MG tablet Take 1 tablet (10 mg total) by mouth daily.    Budeson-Glycopyrrol-Formoterol  (BREZTRI  AEROSPHERE) 160-9-4.8 MCG/ACT AERO INHALE 2 PUFFS TWICE DAILY 10/06/2023: Gets through PAP with AZ&Me   carboxymethylcellul-glycerin (LUBRICANT DROPS/DUAL-ACTION) 0.5-0.9 % ophthalmic solution Place 1-2 drops into both eyes  3 (three) times daily as needed for dry eyes.    dapagliflozin  propanediol (FARXIGA ) 10 MG TABS tablet Take 1 tablet (10 mg total) by mouth daily.    diltiazem  (CARTIA  XT) 180 MG 24 hr capsule Take 1 capsule (180 mg total) by mouth 2 (two) times daily.    fluticasone  (FLONASE ) 50 MCG/ACT nasal spray Place 1 spray into both nostrils in the morning and at bedtime.    furosemide  (LASIX ) 40 MG tablet Take 1 tablet (40 mg total) by mouth 2 (two) times daily as needed (fluid retention.).    glucose blood (ONETOUCH VERIO) test strip USE 1 STRIP TO CHECK GLUCOSE 2 TIMES DAILY AS DIRECTED for diabetes    loratadine  (CLARITIN ) 10 MG tablet Take 10 mg by mouth daily.    montelukast  (SINGULAIR ) 10 MG tablet Take 1 tablet (10 mg total) by mouth at bedtime.    Multiple Vitamin (MULTIVITAMIN WITH MINERALS) TABS tablet Take 1 tablet by mouth daily.    nitroGLYCERIN  (NITROSTAT ) 0.4 MG SL tablet Place 1 tablet (0.4 mg total) under the tongue every 5 (five) minutes as needed for chest pain. X3 tablets then call 911 if no relief.    pantoprazole  (PROTONIX ) 40 MG tablet Take 1 tablet (40 mg total) by mouth 2 (two) times daily.    pneumococcal 20-valent conjugate vaccine (PREVNAR 20 ) 0.5 ML injection Inject 0.5 mLs into the muscle once as needed for up to 1 dose for immunization.    potassium chloride  (KLOR-CON  M)  10 MEQ tablet Take 1 tablet (10 mEq total) by mouth 2 (two) times daily as needed. With furosemide .  Home med.    promethazine -dextromethorphan  (PROMETHAZINE -DM) 6.25-15 MG/5ML syrup Take 5 mLs by mouth 4 (four) times daily as needed.    theophylline  (THEODUR) 300 MG 12 hr tablet Take 1 tablet (300 mg total) by mouth 2 (two) times daily.    [DISCONTINUED] ALPRAZolam  (XANAX ) 0.5 MG tablet Take 1 tablet (0.5 mg total) by mouth 2 (two) times daily as needed for anxiety or sleep. for anxiety. May take 1 extra tab for severe anxiety/panic daily as needed    No facility-administered encounter medications on file as  of 04/07/2024.    Surgical History: Past Surgical History:  Procedure Laterality Date   BLADDER SUSPENSION N/A 05/23/2020   Procedure: TRANSVAGINAL TAPE (TVT) PROCEDURE;  Surgeon: Arloa Lamar SQUIBB, MD;  Location: [facility] ORS;  Service: Gynecology;  Laterality: N/A;   cataract surgery     CHOLECYSTECTOMY     COLONOSCOPY WITH PROPOFOL  N/A 01/06/2018   Procedure: COLONOSCOPY WITH PROPOFOL ;  Surgeon: Gaylyn Gladis PENNER, MD;  Location: [facility] ENDOSCOPY;  Service: Endoscopy;  Laterality: N/A;   CYSTOCELE REPAIR N/A 05/23/2020   Procedure: ANTERIOR REPAIR (CYSTOCELE);  Surgeon: Arloa Lamar SQUIBB, MD;  Location: [facility] ORS;  Service: Gynecology;  Laterality: N/A;   CYSTOSCOPY N/A 05/23/2020   Procedure: CYSTOSCOPY;  Surgeon: Arloa Lamar SQUIBB, MD;  Location: [facility] ORS;  Service: Gynecology;  Laterality: N/A;   ESOPHAGEAL DILATION     ESOPHAGOGASTRODUODENOSCOPY (EGD) WITH PROPOFOL  N/A 01/06/2018   Procedure: ESOPHAGOGASTRODUODENOSCOPY (EGD) WITH PROPOFOL ;  Surgeon: Gaylyn Gladis PENNER, MD;  Location: [facility] ENDOSCOPY;  Service: Endoscopy;  Laterality: N/A;   EYE SURGERY Bilateral 2014   cataract    HERNIA REPAIR     hiatial hernia repair     NASAL SINUS SURGERY     PUBOVAGINAL SLING N/A 05/23/2020   Procedure: CARLOYN GLADE;  Surgeon: Arloa Lamar SQUIBB, MD;  Location: [facility] ORS;  Service: Gynecology;  Laterality: N/A;   RECTOCELE REPAIR  05/23/2020   Procedure: POSTERIOR REPAIR (RECTOCELE);  Surgeon: Arloa Lamar SQUIBB, MD;  Location: [facility] ORS;  Service: Gynecology;;   VAGINAL HYSTERECTOMY Bilateral 05/23/2020   Procedure: HYSTERECTOMY VAGINAL BILATERIAL SALPINGO OOPHORECTOMY;  Surgeon: Arloa Lamar SQUIBB, MD;  Location: [facility] ORS;  Service: Gynecology;  Laterality: Bilateral;  LEFT fallopian tube, BILAT ovaries    Medical History: Past Medical History:  Diagnosis Date   Anemia    Asthma    CHF (congestive heart failure) (HCC)    1991- unknown after asthma attack   Chicken pox    Complication of anesthesia  1998   woke up during procedure    COPD (chronic obstructive pulmonary disease) (HCC)    Diabetes (HCC)    Type II    Difficult 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    DVT (deep venous thrombosis) (HCC)    GERD (gastroesophageal reflux disease)    Hernia, hiatal 2007   HLD (hyperlipidemia)    Hypertension    Hypertension    Pneumonia    Seasonal allergies     Family History: Family History  Problem Relation Age of Onset   Hypertension Mother    Lung cancer Mother    Colon cancer Father    Colon polyps Sister    Diabetes Sister    Breast cancer Neg Hx     Social History   Socioeconomic History   Marital status: Married    Spouse name:  Not on file   Number of children: Not on file   Years of education: Not on file   Highest education level: Not on file  Occupational History   Not on file  Tobacco Use   Smoking status: Never   Smokeless tobacco: Never  Vaping Use   Vaping status: Never Used  Substance and Sexual Activity   Alcohol use: Yes    Comment: glass of wine rarely   Drug use: No   Sexual activity: Not on file  Other Topics Concern   Not on file  Social History Narrative   Lives at home with family. Independent   Social Drivers of Health   Tobacco Use: Low Risk (04/07/2024)   Patient History    Smoking Tobacco Use: Never    Smokeless Tobacco Use: Never    Passive Exposure: Not on file  Financial Resource Strain: Low Risk (12/03/2022)   Overall Financial Resource Strain (CARDIA)    Difficulty of Paying Living Expenses: Not hard at all  Food Insecurity: No Food Insecurity (02/21/2023)   Hunger Vital Sign    Worried About Running Out of Food in the Last Year: Never true    Ran Out of Food in the Last Year: Never true  Transportation Needs: No Transportation Needs (02/21/2023)   PRAPARE - Administrator, Civil Service (Medical): No    Lack of Transportation (Non-Medical): No  Physical Activity: Insufficiently Active (12/03/2022)    Exercise Vital Sign    Days of Exercise per Week: 3 days    Minutes of Exercise per Session: 20 min  Stress: Stress Concern Present (12/03/2022)   [employer] of Occupational Health - Occupational Stress Questionnaire    Feeling of Stress : To some extent  Social Connections: Moderately Integrated (12/03/2022)   Social Connection and Isolation Panel    Frequency of Communication with Friends and Family: More than three times a week    Frequency of Social Gatherings with Friends and Family: Once a week    Attends Religious Services: More than 4 times per year    Active Member of [employer] or Organizations: No    Attends Banker Meetings: Never    Marital Status: Married  Catering Manager Violence: Not At Risk (02/21/2023)   Humiliation, Afraid, Rape, and Kick questionnaire    Fear of Current or Ex-Partner: No    Emotionally Abused: No    Physically Abused: No    Sexually Abused: No  Depression (PHQ2-9): Low Risk (01/14/2024)   Depression (PHQ2-9)    PHQ-2 Score: 0  Alcohol Screen: Low Risk (01/14/2024)   Alcohol Screen    Last Alcohol Screening Score (AUDIT): 2  Housing: Unknown (02/21/2023)   Housing Stability Vital Sign    Unable to Pay for Housing in the Last Year: No    Number of Times Moved in the Last Year: Not on file    Homeless in the Last Year: No  Utilities: Not At Risk (02/21/2023)   [facility] Utilities    Threatened with loss of utilities: No  Health Literacy: Low Risk (04/29/2023)   Received from [facility]   Health Literacy    : Never      Review of Systems  Constitutional:  Negative for chills, fatigue and unexpected weight change.  HENT:  Negative for congestion, ear pain, postnasal drip, rhinorrhea, sneezing, sore throat and trouble swallowing.   Eyes:  Negative for redness.  Respiratory:  Positive for cough (intermittent). Negative for chest tightness, shortness  of breath and wheezing.   Cardiovascular: Negative.  Negative for chest pain  and palpitations.  Gastrointestinal: Negative.  Negative for abdominal pain, constipation, diarrhea, nausea and vomiting.  Genitourinary:  Negative for dysuria, frequency and urgency.  Musculoskeletal:  Negative for arthralgias, back pain, joint swelling and neck pain.  Skin:  Negative for rash.  Neurological: Negative.  Negative for tremors and numbness.  Hematological:  Negative for adenopathy. Does not bruise/bleed easily.  Psychiatric/Behavioral:  Negative for behavioral problems (Depression), self-injury, sleep disturbance and suicidal ideas. The patient is nervous/anxious.     Vital Signs: BP 132/84   Pulse 96   Temp 97.9 F (36.6 C)   Resp 16   Ht 5' 3 (1.6 m)   Wt 194 lb 6.4 oz (88.2 kg)   SpO2 97%   BMI 34.44 kg/m    Physical Exam Vitals reviewed.  Constitutional:      General: She is not in acute distress.    Appearance: Normal appearance. She is obese. She is not ill-appearing.  HENT:     Head: Normocephalic and atraumatic.  Eyes:     Pupils: Pupils are equal, round, and reactive to light.  Cardiovascular:     Rate and Rhythm: Normal rate and regular rhythm.  Pulmonary:     Effort: Pulmonary effort is normal. No respiratory distress.  Neurological:     Mental Status: She is alert and oriented to person, place, and time.  Psychiatric:        Mood and Affect: Mood normal.        Behavior: Behavior normal.        Assessment/Plan: 1. Type 2 diabetes mellitus with other specified complication, without long-term current use of insulin  (HCC) (Primary) No change in A1c at 7.2 today. Start farxiga  as prescribed 10 mg daily.  - POCT glycosylated hemoglobin (Hb A1C) - dapagliflozin  propanediol (FARXIGA ) 10 MG TABS tablet; Take 1 tablet (10 mg total) by mouth daily.  Dispense: 90 tablet; Refill: 1  2. Brittle nails Encouraged patient to take vitamin C and biotin OTC.   3. Recent URI Doing better, symptoms improving.   4. Generalized anxiety  disorder Continue prn alprazolam  as prescribed. Follow up in 3 months for additional refills.  - ALPRAZolam  (XANAX ) 0.5 MG tablet; Take 1 tablet (0.5 mg total) by mouth 2 (two) times daily as needed for anxiety or sleep. for anxiety. May take 1 extra tab for severe anxiety/panic daily as needed  Dispense: 90 tablet; Refill: 2   General Counseling: Cassie Ross verbalizes understanding of the findings of todays visit and agrees with plan of treatment. I have discussed any further diagnostic evaluation that may be needed or ordered today. We also reviewed her medications today. she has been encouraged to call the office with any questions or concerns that should arise related to todays visit.    Orders Placed This Encounter  Procedures   POCT glycosylated hemoglobin (Hb A1C)    Meds ordered this encounter  Medications   ALPRAZolam  (XANAX ) 0.5 MG tablet    Sig: Take 1 tablet (0.5 mg total) by mouth 2 (two) times daily as needed for anxiety or sleep. for anxiety. May take 1 extra tab for severe anxiety/panic daily as needed    Dispense:  90 tablet    Refill:  2    For future refills   dapagliflozin  propanediol (FARXIGA ) 10 MG TABS tablet    Sig: Take 1 tablet (10 mg total) by mouth daily.    Dispense:  90 tablet  Refill:  1    Fill new script today for diabetes E11.65    Return in about 3 months (around 06/29/2024) for F/U, anxiety med refill, Quintessa Simmerman PCP.   Total time spent:30 Minutes Time spent includes review of chart, medications, test results, and follow up plan with the patient.   [place] Controlled Substance Database was reviewed by me.  This patient was seen by Mardy Maxin, FNP-C in collaboration with Dr. Sigrid Bathe as a part of collaborative care agreement.   Nuvia Hileman R. Maxin, MSN, FNP-C Internal medicine

## 2024-04-14 ENCOUNTER — Telehealth: Payer: Self-pay | Admitting: Nurse Practitioner

## 2024-04-14 NOTE — Telephone Encounter (Signed)
 Lvm to schedule ED follow up-Toni

## 2024-04-15 ENCOUNTER — Other Ambulatory Visit: Payer: Self-pay

## 2024-04-15 ENCOUNTER — Other Ambulatory Visit: Payer: Self-pay | Admitting: Nurse Practitioner

## 2024-04-15 MED ORDER — ACCU-CHEK SOFTCLIX LANCETS MISC: 3 refills | Status: AC

## 2024-04-15 MED ORDER — ACCU-CHEK GUIDE ME W/DEVICE KIT: PACK | 0 refills | Status: AC

## 2024-04-15 MED ORDER — ACCU-CHEK GUIDE TEST VI STRP: ORAL_STRIP | 3 refills | Status: AC

## 2024-06-29 ENCOUNTER — Ambulatory Visit: Admitting: Nurse Practitioner

## 2025-01-17 ENCOUNTER — Ambulatory Visit: Admitting: Nurse Practitioner
